# Patient Record
Sex: Female | Born: 1940 | Race: White | Hispanic: No | Marital: Married | Smoking: Never smoker
Health system: Southern US, Community
[De-identification: ages and names within clinical notes are randomized; demographics above are authoritative.]

## PROBLEM LIST (undated history)

## (undated) DIAGNOSIS — C801 Malignant (primary) neoplasm, unspecified: Secondary | ICD-10-CM

## (undated) DIAGNOSIS — M549 Dorsalgia, unspecified: Secondary | ICD-10-CM

## (undated) DIAGNOSIS — K2970 Gastritis, unspecified, without bleeding: Secondary | ICD-10-CM

## (undated) DIAGNOSIS — I10 Essential (primary) hypertension: Secondary | ICD-10-CM

## (undated) DIAGNOSIS — K5792 Diverticulitis of intestine, part unspecified, without perforation or abscess without bleeding: Secondary | ICD-10-CM

## (undated) DIAGNOSIS — D1803 Hemangioma of intra-abdominal structures: Secondary | ICD-10-CM

## (undated) DIAGNOSIS — E785 Hyperlipidemia, unspecified: Secondary | ICD-10-CM

## (undated) DIAGNOSIS — E669 Obesity, unspecified: Secondary | ICD-10-CM

## (undated) DIAGNOSIS — E079 Disorder of thyroid, unspecified: Secondary | ICD-10-CM

## (undated) DIAGNOSIS — E119 Type 2 diabetes mellitus without complications: Secondary | ICD-10-CM

## (undated) DIAGNOSIS — I5031 Acute diastolic (congestive) heart failure: Secondary | ICD-10-CM

## (undated) DIAGNOSIS — IMO0001 Reserved for inherently not codable concepts without codable children: Secondary | ICD-10-CM

## (undated) HISTORY — DX: Acute diastolic (congestive) heart failure: I50.31

## (undated) HISTORY — DX: Diverticulitis of intestine, part unspecified, without perforation or abscess without bleeding: K57.92

## (undated) HISTORY — DX: Obesity, unspecified: E66.9

## (undated) HISTORY — PX: NASAL RECONSTRUCTION: SHX2069

## (undated) HISTORY — DX: Hemangioma of intra-abdominal structures: D18.03

## (undated) HISTORY — PX: APPENDECTOMY: SHX54

## (undated) HISTORY — DX: Gastritis, unspecified, without bleeding: K29.70

## (undated) HISTORY — DX: Type 2 diabetes mellitus without complications: E11.9

## (undated) HISTORY — PX: ABDOMINAL HYSTERECTOMY: SHX81

## (undated) HISTORY — DX: Reserved for inherently not codable concepts without codable children: IMO0001

## (undated) HISTORY — DX: Dorsalgia, unspecified: M54.9

## (undated) HISTORY — DX: Disorder of thyroid, unspecified: E07.9

## (undated) HISTORY — DX: Essential (primary) hypertension: I10

## (undated) HISTORY — DX: Hyperlipidemia, unspecified: E78.5

---

## 1998-10-10 ENCOUNTER — Other Ambulatory Visit: Admission: RE | Admit: 1998-10-10 | Discharge: 1998-10-10 | Payer: Self-pay | Admitting: Family Medicine

## 2000-09-17 ENCOUNTER — Encounter: Admission: RE | Admit: 2000-09-17 | Discharge: 2000-09-17 | Payer: Self-pay | Admitting: Orthopaedic Surgery

## 2000-09-17 ENCOUNTER — Encounter: Payer: Self-pay | Admitting: Orthopaedic Surgery

## 2000-09-23 ENCOUNTER — Other Ambulatory Visit: Admission: RE | Admit: 2000-09-23 | Discharge: 2000-09-23 | Payer: Self-pay | Admitting: Family Medicine

## 2001-10-19 ENCOUNTER — Encounter: Admission: RE | Admit: 2001-10-19 | Discharge: 2001-10-19 | Payer: Self-pay | Admitting: Family Medicine

## 2001-10-19 ENCOUNTER — Encounter: Payer: Self-pay | Admitting: Family Medicine

## 2002-04-07 ENCOUNTER — Encounter: Admission: RE | Admit: 2002-04-07 | Discharge: 2002-04-07 | Payer: Self-pay | Admitting: Family Medicine

## 2002-04-07 ENCOUNTER — Encounter: Payer: Self-pay | Admitting: Family Medicine

## 2002-05-14 ENCOUNTER — Encounter: Payer: Self-pay | Admitting: Orthopaedic Surgery

## 2002-05-14 ENCOUNTER — Encounter: Admission: RE | Admit: 2002-05-14 | Discharge: 2002-05-14 | Payer: Self-pay | Admitting: Orthopaedic Surgery

## 2002-11-02 ENCOUNTER — Encounter: Payer: Self-pay | Admitting: Family Medicine

## 2002-11-02 ENCOUNTER — Encounter: Admission: RE | Admit: 2002-11-02 | Discharge: 2002-11-02 | Payer: Self-pay | Admitting: Family Medicine

## 2002-11-05 ENCOUNTER — Encounter: Admission: RE | Admit: 2002-11-05 | Discharge: 2002-11-05 | Payer: Self-pay | Admitting: Family Medicine

## 2002-11-05 ENCOUNTER — Encounter: Payer: Self-pay | Admitting: Family Medicine

## 2003-08-24 ENCOUNTER — Encounter: Admission: RE | Admit: 2003-08-24 | Discharge: 2003-08-24 | Payer: Self-pay | Admitting: Family Medicine

## 2003-09-06 ENCOUNTER — Ambulatory Visit (HOSPITAL_COMMUNITY): Admission: RE | Admit: 2003-09-06 | Discharge: 2003-09-06 | Payer: Self-pay | Admitting: Family Medicine

## 2004-03-15 ENCOUNTER — Inpatient Hospital Stay (HOSPITAL_COMMUNITY): Admission: EM | Admit: 2004-03-15 | Discharge: 2004-03-21 | Payer: Self-pay | Admitting: Emergency Medicine

## 2004-03-16 ENCOUNTER — Encounter: Payer: Self-pay | Admitting: Cardiology

## 2004-03-20 ENCOUNTER — Encounter (INDEPENDENT_AMBULATORY_CARE_PROVIDER_SITE_OTHER): Payer: Self-pay | Admitting: *Deleted

## 2004-06-13 ENCOUNTER — Ambulatory Visit (HOSPITAL_COMMUNITY): Admission: RE | Admit: 2004-06-13 | Discharge: 2004-06-13 | Payer: Self-pay | Admitting: Family Medicine

## 2004-06-20 ENCOUNTER — Encounter (INDEPENDENT_AMBULATORY_CARE_PROVIDER_SITE_OTHER): Payer: Self-pay | Admitting: *Deleted

## 2004-06-20 ENCOUNTER — Ambulatory Visit (HOSPITAL_COMMUNITY): Admission: RE | Admit: 2004-06-20 | Discharge: 2004-06-21 | Payer: Self-pay | Admitting: General Surgery

## 2006-06-24 ENCOUNTER — Encounter: Admission: RE | Admit: 2006-06-24 | Discharge: 2006-06-24 | Payer: Self-pay | Admitting: Family Medicine

## 2006-08-28 ENCOUNTER — Encounter: Admission: RE | Admit: 2006-08-28 | Discharge: 2006-08-28 | Payer: Self-pay | Admitting: Family Medicine

## 2006-09-04 ENCOUNTER — Encounter: Admission: RE | Admit: 2006-09-04 | Discharge: 2006-09-04 | Payer: Self-pay | Admitting: Family Medicine

## 2006-11-06 ENCOUNTER — Emergency Department (HOSPITAL_COMMUNITY): Admission: EM | Admit: 2006-11-06 | Discharge: 2006-11-07 | Payer: Self-pay | Admitting: Emergency Medicine

## 2007-04-14 ENCOUNTER — Ambulatory Visit: Payer: Self-pay | Admitting: Vascular Surgery

## 2007-07-16 ENCOUNTER — Inpatient Hospital Stay (HOSPITAL_COMMUNITY): Admission: EM | Admit: 2007-07-16 | Discharge: 2007-07-18 | Payer: Self-pay | Admitting: Emergency Medicine

## 2008-01-19 ENCOUNTER — Encounter: Admission: RE | Admit: 2008-01-19 | Discharge: 2008-01-19 | Payer: Self-pay | Admitting: Family Medicine

## 2008-03-18 ENCOUNTER — Encounter: Admission: RE | Admit: 2008-03-18 | Discharge: 2008-03-18 | Payer: Self-pay | Admitting: Family Medicine

## 2008-07-13 ENCOUNTER — Encounter: Admission: RE | Admit: 2008-07-13 | Discharge: 2008-07-13 | Payer: Self-pay | Admitting: Cardiology

## 2008-10-10 ENCOUNTER — Encounter: Admission: RE | Admit: 2008-10-10 | Discharge: 2008-10-10 | Payer: Self-pay | Admitting: Orthopaedic Surgery

## 2009-06-10 DIAGNOSIS — IMO0001 Reserved for inherently not codable concepts without codable children: Secondary | ICD-10-CM

## 2009-06-10 HISTORY — DX: Reserved for inherently not codable concepts without codable children: IMO0001

## 2009-06-23 ENCOUNTER — Ambulatory Visit: Admission: RE | Admit: 2009-06-23 | Discharge: 2009-06-23 | Payer: Self-pay | Admitting: Orthopaedic Surgery

## 2009-06-23 ENCOUNTER — Ambulatory Visit: Payer: Self-pay | Admitting: Surgery

## 2009-06-23 ENCOUNTER — Encounter (INDEPENDENT_AMBULATORY_CARE_PROVIDER_SITE_OTHER): Payer: Self-pay | Admitting: Orthopaedic Surgery

## 2010-02-05 ENCOUNTER — Ambulatory Visit: Payer: Self-pay | Admitting: Cardiology

## 2010-02-08 ENCOUNTER — Ambulatory Visit: Payer: Self-pay | Admitting: Cardiology

## 2010-05-30 ENCOUNTER — Ambulatory Visit: Payer: Self-pay | Admitting: Cardiology

## 2010-06-13 ENCOUNTER — Ambulatory Visit: Payer: Self-pay | Admitting: Cardiology

## 2010-06-19 ENCOUNTER — Ambulatory Visit: Payer: Self-pay | Admitting: Cardiology

## 2010-06-25 ENCOUNTER — Encounter
Admission: RE | Admit: 2010-06-25 | Discharge: 2010-06-25 | Payer: Self-pay | Source: Home / Self Care | Attending: Family Medicine | Admitting: Family Medicine

## 2010-09-13 ENCOUNTER — Other Ambulatory Visit (INDEPENDENT_AMBULATORY_CARE_PROVIDER_SITE_OTHER): Payer: Medicare Other | Admitting: *Deleted

## 2010-09-13 DIAGNOSIS — E7800 Pure hypercholesterolemia, unspecified: Secondary | ICD-10-CM

## 2010-09-13 LAB — LIPID PANEL
Cholesterol: 219 mg/dL — ABNORMAL HIGH (ref 0–200)
HDL: 44 mg/dL (ref >39.00)
Total CHOL/HDL Ratio: 5
Triglycerides: 91 mg/dL (ref 0.0–149.0)
VLDL: 18.2 mg/dL (ref 0.0–40.0)

## 2010-09-13 LAB — HEPATIC FUNCTION PANEL
ALT: 15 U/L (ref 0–35)
AST: 13 U/L (ref 0–37)
Albumin: 3.6 g/dL (ref 3.5–5.2)
Alkaline Phosphatase: 75 U/L (ref 39–117)
Bilirubin, Direct: 0.2 mg/dL (ref 0.0–0.3)
Total Bilirubin: 0.8 mg/dL (ref 0.3–1.2)
Total Protein: 6.2 g/dL (ref 6.0–8.3)

## 2010-09-13 LAB — BASIC METABOLIC PANEL
BUN: 13 mg/dL (ref 6–23)
CO2: 27 mEq/L (ref 19–32)
Calcium: 9 mg/dL (ref 8.4–10.5)
Chloride: 106 mEq/L (ref 96–112)
Creatinine, Ser: 0.6 mg/dL (ref 0.4–1.2)
GFR: 103.03 mL/min (ref >60.00)
Glucose, Bld: 112 mg/dL — ABNORMAL HIGH (ref 70–99)
Potassium: 3.9 mEq/L (ref 3.5–5.1)
Sodium: 144 mEq/L (ref 135–145)

## 2010-09-13 LAB — LDL CHOLESTEROL, DIRECT: Direct LDL: 159.5 mg/dL

## 2010-09-14 ENCOUNTER — Other Ambulatory Visit: Payer: Self-pay | Admitting: *Deleted

## 2010-09-17 ENCOUNTER — Encounter: Payer: Self-pay | Admitting: *Deleted

## 2010-09-18 ENCOUNTER — Encounter: Payer: Self-pay | Admitting: Cardiology

## 2010-09-18 ENCOUNTER — Ambulatory Visit (INDEPENDENT_AMBULATORY_CARE_PROVIDER_SITE_OTHER): Payer: Medicare Other | Admitting: Cardiology

## 2010-09-18 DIAGNOSIS — D1803 Hemangioma of intra-abdominal structures: Secondary | ICD-10-CM

## 2010-09-18 DIAGNOSIS — Z780 Asymptomatic menopausal state: Secondary | ICD-10-CM | POA: Insufficient documentation

## 2010-09-18 DIAGNOSIS — E119 Type 2 diabetes mellitus without complications: Secondary | ICD-10-CM

## 2010-09-18 DIAGNOSIS — I119 Hypertensive heart disease without heart failure: Secondary | ICD-10-CM

## 2010-09-18 DIAGNOSIS — R079 Chest pain, unspecified: Secondary | ICD-10-CM | POA: Insufficient documentation

## 2010-09-18 DIAGNOSIS — E785 Hyperlipidemia, unspecified: Secondary | ICD-10-CM

## 2010-09-18 DIAGNOSIS — R42 Dizziness and giddiness: Secondary | ICD-10-CM | POA: Insufficient documentation

## 2010-09-18 DIAGNOSIS — E039 Hypothyroidism, unspecified: Secondary | ICD-10-CM

## 2010-09-18 DIAGNOSIS — IMO0001 Reserved for inherently not codable concepts without codable children: Secondary | ICD-10-CM | POA: Insufficient documentation

## 2010-09-18 NOTE — Assessment & Plan Note (Signed)
The patient complains of lack of energy.  However she appears to be euthyroid clinically.  She is getting ready to go on a cruise to [place] with her husband and her grandson Soon.

## 2010-09-18 NOTE — Assessment & Plan Note (Signed)
The patient has a history of hemangioma of the liver.  This causes right upper quadrant discomfort at times.  The patient is also been having some menopausal symptoms of vaginitis and has an appointment to see her gynecologist soon.  She is aware that she wants to avoid estrogen therapy because of the hemangioma of the liver.

## 2010-09-18 NOTE — Assessment & Plan Note (Signed)
The patient has a long history of essential hypertension.  She has been having occasional headaches and occasional dizziness but she's had no recent chest pain or angina.

## 2010-09-18 NOTE — Progress Notes (Signed)
History of Present Illness: This 70 year old woman is seen for a scheduled followup office visit she has a past history of essential hypertension and history of diabetes mellitus and dyslipidemia.  She's had a history of atypical chest pain.  She had a normal LexiScan Cardiolite stress test on 12/20/09.  There was no ischemia and her ejection fraction was 77%.  She's had problems with elevated hemoglobin A 1C.  In January her A1c was 9.7.  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  Current Outpatient Prescriptions  Medication Sig Dispense Refill  . aspirin 81 MG tablet Take 81 mg by mouth daily.        . colesevelam (WELCHOL) 625 MG tablet Take 1,875 mg by mouth as needed.        . hydrochlorothiazide 25 MG tablet Take 25 mg by mouth daily.        . insulin glargine (LANTUS) 100 UNIT/ML injection Inject into the skin at bedtime.        Marland Kitchen levothyroxine (SYNTHROID, LEVOTHROID) 150 MCG tablet Take 150 mcg by mouth daily.        . nitroGLYCERIN (NITROSTAT) 0.4 MG SL tablet Place 0.4 mg under the tongue every 5 (five) minutes as needed.        Maxwell Caul Bicarbonate (ZEGERID PO) Take by mouth as needed.        . furosemide (LASIX) 40 MG tablet Take 40 mg by mouth 2 (two) times daily.        . meclizine (ANTIVERT) 50 MG tablet Take 25 mg by mouth 3 (three) times daily as needed.        . Zinc 10 MG LOZG Use as directed in the mouth or throat.          Allergies  Allergen Reactions  . Crestor (Rosuvastatin Calcium)     LEG PAIN  . Lipitor (Atorvastatin Calcium)     CHEST PAIN, SOB  . Metformin And Related   . Niaspan (Niacin (Antihyperlipidemic))     RASH  . Pravachol   . Ranexa     EDEMA  . Tricor   . Zetia (Ezetimibe)     NO SLEEP, SOB    Patient Active Problem List  Diagnoses  . Hypothyroidism  . Benign hypertensive heart disease without heart  failure  . Dyslipidemia  . Diabetes mellitus  . Hemangioma of liver  . Postmenopausal state  . Chest pain  . Dizziness    History  Smoking status  . Never Smoker   Smokeless tobacco  . Not on file    History  Alcohol Use No    No family history on file.  Review of Systems: Constitutional: no fever chills diaphoresis or fatigue or change in weight.  Head and neck: no hearing loss, no epistaxis, no photophobia or visual disturbance. Respiratory: No cough, shortness of breath or wheezing. Cardiovascular: No chest pain peripheral edema, palpitations. Gastrointestinal: No abdominal distention, no abdominal pain, no change in bowel habits hematochezia or melena. Genitourinary: No dysuria, no frequency, no urgency, no nocturia. Musculoskeletal:No arthralgias, no back pain, no gait disturbance or myalgias. Neurological: No dizziness, no headaches, , no seizures, no syncope, no weakness, no tremors. Hematologic: No lymphadenopathy, no easy bruising. Psychiatric: No confusion, no hallucinations, no sleep disturbance.    Physical Exam: Filed Vitals:   09/18/10 1340  BP: 136/82  Weight is 191, down 1  pound.  General appearance Reveals a overweight woman in no acute distress.Pupils equal and reactive.   Extraocular Movements are full.  There is no scleral icterus.  The mouth and pharynx are normal.  The neck is supple.  The carotids reveal no bruits.  The jugular venous pressure is normal.  The thyroid is not enlarged.  There is no lymphadenopathy.The chest is clear to percussion and auscultation. There are no rales or rhonchi. Expansion of the chest is symmetrical.The precordium is quiet.  The first heart sound is normal.  The second heart sound is physiologically split.  There is no murmur gallop rub or click.  There is no abnormal lift or heave.The abdomen is soft and nontender. Bowel sounds are normal. The liver and spleen are not enlarged. There Are no abdominal masses. There are no  bruits.  The breasts reveal no masses.The pedal pulses are good.  There is no phlebitis or edema.  There is no cyanosis or clubbing.  Neurologic exam reveals decreased sensation in her feet.   Assessment / Plan: We reviewed her labs.  Work harder on diabetes and dyslipidemia with careful diet and weight loss.  We gave her a prescription for Transderm scope patches to use on her [place] cruise next month.  Recheck in 3 months for followup office visit and lab work including glycohemoglobin

## 2010-09-18 NOTE — Assessment & Plan Note (Signed)
Her lipids are still above goal and she is going to try harder with careful diet to bring these down further.  She will continue same medication.

## 2010-09-18 NOTE — Assessment & Plan Note (Signed)
The patient continues to have some problems with elevated blood sugars.  She also has symptoms of diabetic peripheral neuropathy with decreased feeling in her toes and her feet.  She has balance problems and we have given her a prescription for a rolling walker with a seat since she has a tendency to fall.  She has also had previous spine surgery which may be affecting her leg strength and her balance as well.

## 2010-10-23 NOTE — Discharge Summary (Signed)
Whitney Newton, Whitney Newton                ACCOUNT NO.:  [id number]   MEDICAL RECORD NO.:  [id number]          PATIENT TYPE:  INP   LOCATION:  5128                         FACILITY:  [facility]   PHYSICIAN:  Corinna L. Lendell Caprice, MDDATE OF BIRTH:  31-May-1941   DATE OF ADMISSION:  07/16/2007  DATE OF DISCHARGE:  07/18/2007                               DISCHARGE SUMMARY   DISCHARGE DIAGNOSES:  1. Vomiting, possible mild ileus.  2. Diabetes.  3. Recent back surgery.  4. Hyperlipidemia.  5. History of stroke.   DISCHARGE MEDICATIONS:  Reglan 10 mg p.o. q.6 h p.r.n. nausea.  She may  continue the rest of her outpatient medications, which include Ziac,  hydrochlorothiazide, Synthroid, aspirin, Lantus, Colace, Cozaar.   HISTORY AND PHYSICAL:  Please see H&P for complete details.   CONDITION ON DISCHARGE:  Stable.   ACTIVITY:  Ad lib.   FOLLOWUP:  Followup with Dr. Clovis Riley as needed.   PROCEDURES:  None.   DIET:  Diet should be diabetic low-salt.   LABORATORY DATA:  CBC significant for white blood cell count of 10.9, D-  dimer 0.97.  Complete metabolic panel significant for a glucose of 157,  lipase 30.  Urinalysis specific gravity 1.034, moderate bilirubin,  greater than 80 ketones, negative nitrites, small leukocyte esterase.  Hemoccult of the stool was negative.   SPECIAL STUDIES/RADIOLOGY:  Two views of the abdomen on admission showed  probable mild focal ileus, no obstruction.  T-spine x-ray unremarkable.  Chest x-ray 2 views negative.  Flat and upright abdominal film on  February 7 day of discharge was negative.   HISTORY AND HOSPITAL COURSE:  Whitney Newton is a 70 year old white female  patient of Dr. Clovis Riley who had recent back surgery at [facility].  She had  intractable vomiting.  She is a somewhat difficult historian, but was  found to have a possible ileus on admission and was therefore admitted  for supportive care.  She was started on clear liquids,  IV Reglan, IV fluids and IV  Dilaudid.  Her vomiting resolved and at the  time of discharge she had a normal x-ray and was tolerating a regular  diet.  Her abdomen was soft, nontender. She has a very anxious affect,  but otherwise unremarkable physical exam.      Corinna L. Lendell Caprice, MD  Electronically Signed     CLS/MEDQ  D:  07/18/2007  T:  07/20/2007  Job:  [id number]   cc:   L. Lupe Carney, M.D.

## 2010-10-23 NOTE — H&P (Signed)
Whitney Newton                ACCOUNT NO.:  [id number]   MEDICAL RECORD NO.:  [id number]           PATIENT TYPE:   LOCATION:                                 FACILITY:   PHYSICIAN:  Hollice Espy, M.D.DATE OF BIRTH:  Oct 03, 1940   DATE OF ADMISSION:  07/16/2007  DATE OF DISCHARGE:                              HISTORY & PHYSICAL   PRIMARY CARE PHYSICIAN:  Whitney Newton, M.D.   CARDIOLOGIST:  Whitney Newton, M.D.   CHIEF COMPLAINT:  Abdominal pain.   HISTORY OF PRESENT ILLNESS:  The patient is a 71 year old white female  with past medical history of hypertension and diabetes mellitus who, in  the past week, underwent a lower thoracic spine herniated disk repair.  Postoperatively, she was put on narcotics, and she said she tried to use  these sparingly because of concern about constipation.  She continued to  have issues to the point where she had continued nausea and vomiting and  abdominal pain, unable to keep anything down.  She ended up being put on  rectal suppositories which did little in terms of giving her a bowel  movement and has not had one for several days.  When she came to the  emergency room today, she was noted to have a white count of 10.9 with  no shift.  The rest of her labs were notable for urine consistent with  being dehydrated.  X-ray of the abdomen noted a focal loop of bowel that  looks consistent with an ileus.  The patient was placed on IV fluids and  already says she is feeling a little bit better.  She denies any  headaches, vision changes, dysphagia.  No chest pain, palpitations.  No  acute shortness of breath.  No wheezing or coughing.  She complains of  some abdominal soreness but currently says that her generalized  abdominal pain is minimal.  She has some nausea, but this is minimal.  No hematuria or dysuria.  No diarrhea.  No focal extremity numbness,  weakness, or pain.   REVIEW OF SYSTEMS:  Otherwise negative.   PAST MEDICAL  HISTORY:  1. Hypothyroidism.  2. Hypertension.  3. Hyperlipidemia.  4. Diabetes mellitus.  5. Recent thoracic spinal surgery.   MEDICATIONS:  She is on aspirin, Cozaar, hydrochlorothiazide, Lantus,  Synthroid, Ziac, Darvocet-N 100, p.r.n. Percocet, and Levsin.   ALLERGIES:  No known drug allergies.   SOCIAL HISTORY:  No tobacco, alcohol, or drug use.   FAMILY HISTORY:  Noncontributory.   PHYSICAL EXAMINATION:  VITAL SIGNS:  On admission, temperature 99.0,  heart rate 111, now down to 78.  Blood pressure 160/91, now down to  159/68, respirations 20, O2 saturation 97% on room air.  GENERAL:  Alert and oriented x3.  No apparent distress.  HEENT:  Normocephalic and atraumatic.  Mucous membranes dry.  NECK:  No carotid bruits.  HEART:  Regular rate and rhythm.  S1, S2.  Soft 2/6 systolic ejection  murmur.  LUNGS:  Clear to auscultation bilaterally.  ABDOMEN:  Soft, nontender, mild distention.  Scant bowel sounds.  EXTREMITIES:  No clubbing, cyanosis, or edema.   LABORATORY DATA:  Thoracic spine and chest x-ray are unremarkable.   Abdominal x-rays as per HPI.   White count 7.9, hemoglobin 15, hematocrit 45, MCV 89, platelet count  342, no shift.  Sodium 140, potassium 3.5, chloride 103, bicarb 26, BUN 13, creatinine  0.8, glucose 157.  LFTs unremarkable.  Lipase 30. UA: Moderate  bilirubin, greater than 80 ketones, total protein 30.  D-dimer was  ordered and is pending.  Urine microscopy shows 0-6 white cells and rare  bacteria.   ASSESSMENT AND PLAN:  1. Postoperative ileus plus with history of diabetes.  Will make      n.p.o., IV hydration, IV Reglan. Will advance diet as tolerated.  2. Diabetes mellitus.  N.P.O. for now until she is able to tolerate a      full solid diet, put her on sliding scale only.  3. Hypertension.  P.R.N. Lopressor.  4. Hypothyroidism, holding Synthroid.      Hollice Espy, M.D.  Electronically Signed     SKK/MEDQ  D:  07/16/2007  T:   07/16/2007  Job:  [id number]   cc:   Whitney Newton, M.D.  Whitney Newton, M.D.

## 2010-10-23 NOTE — Procedures (Signed)
DUPLEX DEEP VENOUS EXAM - LOWER EXTREMITY   INDICATION:  Bilateral lower extremity leg swelling (off and on for  years) which decreases with exercise.  Patient woke up morning of  04/14/07 with excruciating right calf pain which lingered throughout the  morning.  The patient states that these are the same symptoms as when  she had a calf DVT three years ago.   HISTORY:  Edema:  Yes, bilaterally.  Trauma/Surgery:  Right knee surgery three years ago.  Pain:  Right calf.  PE:  No.  Previous DVT:  Three years ago in calf.  Anticoagulants:  Aspirin 81 mg per day.  Other:  Stroke in October, 2005.   DUPLEX EXAM:                CFV   SFV   PopV  PTV    GSV                R  L  R  L  R  L  R   L  R  L  Thrombosis    0  0  0  0  0  0  0   0  0  0  Spontaneous   +  +  +  +  +  +  Phasic        +  +  +  +  +  +  Augmentation  +  +  +  +  +  +  +   +  +  +  Compressible  +  +  +  +  +  +  +   +  +  +  Competent   Legend:  + - yes  o - no  p - partial  D - decreased   IMPRESSION:  No evidence of bilateral deep or superficial venous  thrombosis.    _____________________________  Larina Earthly, M.D.   PB/MEDQ  D:  04/15/2007  T:  04/15/2007  Job:  [id number]

## 2010-10-26 NOTE — Op Note (Signed)
NAMEKUUIPO, ANZALDO                ACCOUNT NO.:  [id number]   MEDICAL RECORD NO.:  [id number]          PATIENT TYPE:  OIB   LOCATION:  2899                         FACILITY:  [facility]   PHYSICIAN:  Leonie Man, M.D.   DATE OF BIRTH:  March 12, 1941   DATE OF PROCEDURE:  06/20/2004  DATE OF DISCHARGE:                                 OPERATIVE REPORT   PREOPERATIVE DIAGNOSIS:  Chronic calculous cholecystitis.   POSTOPERATIVE DIAGNOSIS:  Chronic calculous cholecystitis.   PROCEDURE:  Laparoscopic cholecystectomy with intraoperative cholangiogram.   SURGEON:  Leonie Man, M.D.   ASSISTANT:  Ollen Gross. Carolynne Edouard, M.D.   ANESTHESIA:  General.   NOTE:  The patient is a 70 year old woman who was presenting with epigastric  bloating, pain, nausea and vomiting.  On ultrasound noted to have  cholelithiasis.  Liver function studies are all within normal limits. No  hyperamylasemia associated. The patient has not been having any chills or  fevers. She comes to the operating room now after the risks and potential  benefits of surgery have been discussed. All questions answered, consent  obtained.   PROCEDURE:  Following the induction of satisfactory general anesthesia with  the patient positioned supinely, the abdomen was routinely prepped and  draped to be included in a sterile operative field.  Laparoscopy created at  the umbilicus with insertion of a Hassan cannula, insufflation of the  peritoneal cavity to 42 mmHg using carbon dioxide. Camera was inserted.  Visual exploration of the abdomen carried out.  There are a few adhesions  due to previous surgery.  The liver edges were sharp, and the liver surface  smooth.  There were multiple adhesions to the wall of the gallbladder from  the omentum. Under direct vision, epigastric and lateral ports were placed.  The gallbladder is grasped and retracted cephalad, and adhesions to the  gallbladder wall are taken down serially, carrying the dissection  down to  the ampulla of the gallbladder. The ampulla is held and isolated.  The  cystic duct and cystic artery are identified, tracing the cystic duct up to  the cystic duct-gallbladder junction and the cystic artery up to its entry  into the gallbladder wall. The cystic duct is clipped proximally and opened.  The cystic artery was triply clipped and transected. Cystic duct  cholangiogram was carried out with a Cook catheter, which was passed into  the abdominal cavity and inserted into the cystic duct, and one-half  strength Hypaque was injected into the extrahepatic biliary system with  prompt flow of contrast into the duodenum. The caliber of the extrahepatic  biliary tree was normal. There were no filling defects noted. The cystic  duct catheter was removed, and the cystic duct was triply clipped and  transected. The gallbladder was dissected free from the liver bed using  electrocautery and maintaining hemostasis throughout the course of  dissection.  During the course of the dissection, a small hole was made into  the gallbladder with some spillage of bile. The gallbladder, having been  removed, was then placed into an Endopouch.  The right upper quadrant was  thoroughly irrigated with multiple aliquots of normal saline until clear.  The gallbladder was then retrieved through the umbilical port with a camera  in the epigastric port. This was accomplished without difficulty. Sponge,  instrument and sharp counts were verified. Pneumoperitoneum allowed to  deflate.  The lateral trocars removed under direct vision. Sponge,  instrument and sharp counts were then verified, and the wounds closed in  layers as follows:  Umbilical wound in two layers with 0 Vicryl and 4-0  Monocryl, and 4-  0 Monocryl was used to close the epigastric and lateral flank wounds. All  wounds reinforced with Steri-Strips. Sterile dressings applied. Anesthetic  reversed. The patient removed from the operating room  to the recovery room  in stable condition.  She tolerated the procedure well.      Patr   PB/MEDQ  D:  06/20/2004  T:  06/20/2004  Job:  [id number]   cc:   L. Lupe Carney, M.D.  [address] [place]  [place] [address]  Fax: [phone]

## 2010-10-26 NOTE — H&P (Signed)
Whitney Newton, DELGRANDE                ACCOUNT NO.:  [id number]   MEDICAL RECORD NO.:  [id number]          PATIENT TYPE:  INP   LOCATION:  1851                         FACILITY:  [facility]   PHYSICIAN:  Corinna L. Lendell Caprice, MDDATE OF BIRTH:  [birth year]   DATE OF ADMISSION:  03/15/2004  DATE OF DISCHARGE:                                HISTORY & PHYSICAL   CHIEF COMPLAINT:  Facial weakness and left-sided weakness with left leg  weakness.   HISTORY OF PRESENT ILLNESS:  Whitney Newton is a 70 year old white female  patient of Dr. Quita Skye who presents to the emergency room with left-sided  tingling and weakness.  She was at [facility] when her face began feeling numb,  then her left arm was numb and felt uncoordinated, also her left leg started  getting numb and she was having difficulty walking.  She fumbled in her  purse and noticed that her left hand was having difficulty while she was  trying to open a bottle of aspirin.  She took baby aspirin x2.  Currently  her symptoms are essentially resolved. She has no history of stroke.  She  has had a history of migraines and is very concerned that she had a right-  sided migraine that woke her up in the middle of the night (2 days ago) and  she had some visual changes.  This was typical for her usual migraines.   PAST MEDICAL HISTORY:  1.  Diabetes.  2.  Hypertension.  3.  Hyperlipidemia.  4.  Chronic back pain.   MEDICATIONS:  1.  Welchol 3 tablets p.o. b.i.d.  2.  She is not on any aspirin.  3.  Cozaar 50 mg daily.  4.  Synthroid 0.175 mg alternating with 0.2 mg daily.  5.  Janna Arch (does unknown) b.i.d.  6.  Hydrochlorothiazide 25 mg p.o. daily.   SOCIAL HISTORY:  The patient does not smoke or drink.  She is married and  here with her husband, daughter and grandson.  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   FAMILY HISTORY:  Noncontributory.   REVIEW OF SYSTEMS:  As above, otherwise  negative.   PHYSICAL EXAMINATION:  VITAL SIGNS:  Blood pressure is 146/50, heart rate is  70, temperature is normal, respiratory rate is 20.  GENERAL:  The patient is an anxious appearing, overweight, white female.  HEENT:  Normocephalic, atraumatic.  Pupils are equal, round and reactive to  light. She has facial symmetry.  Moist mucous membranes.  Oropharynx is  clear.  NECK:  Supple, no carotid bruits, no thyromegaly.  LUNGS:  Clear to auscultation bilaterally without wheezes, rhonchi or rales.  CARDIOVASCULAR:  Regular rate and rhythm without murmurs, rubs, or gallops.  ABDOMEN:  Normal bowel sounds, soft, nontender, nondistended.  GU/RECTAL:  Deferred.  EXTREMITIES:  No clubbing, cyanosis or edema.  SKIN:  No rash.  PSYCHIATRIC:  Anxious affect, cooperative.  NEUROLOGIC:  The patient is alert and oriented.  Cranial nerves are intact  except for slightly diminished sensation on the lower  half of the lift side  of her face.  Motor strength is 5 out of 5.  Deep tendon reflexes are 1+ and  equal. Babinski is negative.  Romberg negative, no pronator drift, gait is normal. Finger-to-nose normal.   LABORATORY DATA:  CBC is unremarkable.  Basic metabolic panel significant  for a glucose of 186.   EKG shows normal sinus rhythm with a right bundle branch block.  CT of the  brain shows nothing acute.   ASSESSMENT AND PLAN:  1.  Transient ischemic attack. The patient will be admitted to telemetry.  I      will check and MRI, MRA of the brain, carotid Dopplers, echocardiogram,      lipids.  She will continue an aspirin a day.  She will get neurologic      checks.  2.  Diabetes.  Continued outpatient regimen.  3.  Hyperlipidemia.  Continue outpatient regimen.  4.  Hypertension.  5.  Right bundle branch block.      Cori   CLS/MEDQ  D:  03/15/2004  T:  03/16/2004  Job:  [id number]   cc:   L. Lupe Carney, M.D.  [address] [place]  [place] [address]  Fax: [phone]

## 2010-10-26 NOTE — Procedures (Signed)
[age]   CLINICAL INFORMATION:  This patient is alert and oriented and complained of  having episodes of numbness on the side of her mouth that deviated to the  left with visual disturbances and headache.   TECHNICAL DESCRIPTION:  This EEG was recorded during the awake state.  The  background activity shows 10 Hz rhythms with higher amplitudes seen in the  posterior head regions bilaterally.  No evidence of any focal asymmetry is  present during this study.  There was no evidence of any epileptiform  activity or stage 2 sleep present, though the patient did become drowsy.  Hyperventilation testing produced no abnormalities, and photic stimulation  did not produce a driving response in the occipital head regions.   IMPRESSION:  This is normal EEG during the awake and drowsy states.      ZOX:WRUE  D:  03/19/2004 16:29:29  T:  03/20/2004 11:50:11  Job #:  [id number]

## 2010-10-26 NOTE — Consult Note (Signed)
Whitney Newton, Whitney Newton                ACCOUNT NO.:  [id number]   MEDICAL RECORD NO.:  [id number]          PATIENT TYPE:  INP   LOCATION:  2006                         FACILITY:  [facility]   PHYSICIAN:  Gustavus Messing. Orlin Hilding, M.D.DATE OF BIRTH:  [birth year]   DATE OF CONSULTATION:  03/16/2004  DATE OF DISCHARGE:                                   CONSULTATION   CONSULTING PHYSICIAN:  Santina Evans A. Orlin Hilding, M.D.   REFERRING PHYSICIAN:  Melissa L. Ladona Ridgel, M.D.   REASON FOR CONSULTATION:  Transient left sided weakness and numbness.   HISTORY OF PRESENT ILLNESS:  The patient is a 70 year old right handed white  woman with a history of hypertension, diabetes and hyperlipidemia who also  has a childhood history of classic migraine and an adult history of migraine  equivalents with visual scotoma without headache.  Earlier this week, she  had an episode of significant right sided headache which was different from  her previous usual migraine and during that time of a two day period, which  was about four days ago, she also had a five to ten minute episode of total  vision loss in the right eye.  That cleared and she enjoyed a few days of  baseline health on Wednesday.  Yesterday, on Thursday, she was in a store  and had sudden onset of numbness in the left side of her body starting with  the foot and rapidly proceeding up the body associated also with some  weakness. She was noted by her family to have a left facial droop and to  have some stuttering and some psychomotor delay without actual aphasia or  slurred speech noted. At this point, she did not have any clear visual  symptoms. She did have a very mild headache which was not significant and  unlike other headaches she has had.  This cleared by the time she arrived in  the emergency room and lasted about 15 minutes.  She did take some aspirin  immediately when this happened.  She has further been noted to have  complaints of shortness of breath  and was found to have a deep venous  thrombosis in the left leg and there is concern for possible pulmonary  embolus.  She has had some intermittent problems with numbness or weakness  in her legs, more of a pressure palsy type situation and she does have back  problems and is postulated to either have peripheral focal neuropathies,  compression neuropathies versus radiculopathies.   REVIEW OF SYMPTOMS:  HEENT:  Positive for minimal headache.  MUSCULOSKELETAL:  Chronic problems with pain in her back and legs.  PULMONARY:  Chronic shortness of breath and air hunger.  PSYCHIATRIC:  She  also had one episode of an unusual emotional outburst a few weeks ago.   PAST MEDICAL HISTORY:  1.  Hypertension.  2.  Dyslipidemia.  3.  Diabetes mellitus.  4.  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  5.  Hypothyroidism.   MEDICATIONS:  1.  Welchol.  2.  Cozaar.  3.  Synthroid.  4.  Potassium.  5.  Hydrochlorothiazide.  6.  Aspirin although not on a routine basis as an outpatient.  7.  She has been getting some Lovenox while here.   ALLERGIES:  No known drug allergies.   SOCIAL HISTORY:  She is married and lives with her husband. She is retired  Engineer, civil (consulting).  She denies tobacco, alcohol or IV drug use.   FAMILY HISTORY:  Positive for strokes and TIA's in her mother.   PHYSICAL EXAMINATION:  VITAL SIGNS:  Temperature is 97.6, pulse 66,  respirations 20, blood pressure is 128/79, 98% saturation on room air.  GENERAL EXAM:  She is a slightly overweight, otherwise well developed, well  nourished, pleasant white woman who appears stated age and is in no acute  distress.  HEENT: The head is normocephalic, atraumatic.  The neck is supple without  bruits.  HEART:  Regular rate and rhythm without murmurs.  EXTREMITIES:  Without edema or cyanosis.  NEUROLOGIC EXAM:  Mental status:  She is awake and alert with normal  cognition. She has normal  naming and repetition and follows commands.  Cranial nerves:  Pupils are equal and reactive.  Visual fields are full to  confrontation.  Extraocular movements are intact.  Facial sensation is  normal.  Facial motor activity is normal. Hearing is intact.  Palate is  symmetric and tongue is midline.  There is no dysarthria.  On motor exam  there is no drift, no satelliting, normal rapid fine movement.  No  fasciculations, atrophy or tremor.  There is 5/5 strength in all four  extremities when giving full effort although it takes some coaxing in her  lower extremities.  Her gait is unremarkable, a little waddling and awkward  at first and then smoothed out.  Her husband says that is typical of her  when she has been sitting for a while.  Deep tendon reflexes are absent  diffusely. She has downgoing toes to plantar stimulation.  Coordination:  Finger to nose, heel to shin, rapid alternating movement and tandem gait are  normal.  Sensory exam is now normal.   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  Carotids were unremarkable.   LABORATORY DATA:  Unremarkable.   MRA of the posterior and anterior circulations are normal.   ASSESSMENT:  1.  Transient left face, arm and leg weakness and numbness consistent with a      right brain transient ischemic attack.  2.  Earlier right visual loss consistent with amaurosis fugax, right side.  3.  Migraines and migraine equivalents although her current episodes are not      consistent with these.  4.  Deep venous thrombosis with a question of a pulmonary embolus.  This is     potentially related to her transient ischemic attack if the patient has       a septal defect with a right to left shunt.  She does have independent      stroke risk factors.  She does have evidence of a couple of more lesions      in the right brain than the left.  However, there is nothing shown  clearly in the carotid.  Still, a possible scenario is a deep venous      thrombosis embolizing to the lung and crossing from the right heart to      the left heart via a right to left shunt from a septal defect and then      embolizing to the carotid going to the ophthalmic artery and the middle      cerebral artery branches.  Alternatively, this could simply be small      vessel disease. I do think it is a transient ischemic attack, however,      rather than migraine.   RECOMMENDATIONS:  1.  Would check echo results.  She may need a transesophageal      echocardiogram.  2.  I will order a transcranial Doppler with bubble study and embolic      evaluation which may also be helpful.  3.  She is currently on Lovenox and may need to go on Coumadin anyway for      deep venous thrombosis treatment and possibly to prevent pulmonary      embolus.  4.  She may need to go on aspirin or Aggrenox for purposes of small vessel      disease, but that remains to be seen.  5.  The stroke team will follow.       CAW/MEDQ  D:  03/16/2004  T:  03/17/2004  Job:  [id number]   cc:   Melissa L. Ladona Ridgel, MD  Fax: [phone]

## 2010-10-26 NOTE — Discharge Summary (Signed)
NAMEAMELIAH, Whitney Newton                ACCOUNT NO.:  [id number]   MEDICAL RECORD NO.:  [id number]          PATIENT TYPE:  INP   LOCATION:  2006                         FACILITY:  [facility]   PHYSICIAN:  Hollice Espy, M.D.DATE OF BIRTH:  12/19/1940   DATE OF ADMISSION:  03/15/2004  DATE OF DISCHARGE:  03/21/2004                                 DISCHARGE SUMMARY   ADDENDUM:  When patient came in she had been complaining lately of a vaginal  discharge, described as grayish. She also complained of a burning which she  described as worse when she bent over. There was some question and concern  whether this was a GYN problem versus a neurologic problem given that she  was complaining of burning and pain when she bent over. However, my partner,  Dr. Ladona Ridgel, was able to discuss this with the patient and a UA as well as  wet prep were performed. The wet prep was negative for any type of  Trichomonas, clue cells or so. Her UA was noted to be completely negative,  and a urine culture showed only 20,000 colonies of multiple pathogens, most  likely a contaminate. The patient was still complaining of an occasional  burning, but this had resolved by the time of discharge. She, however, has  noticed an increase in urinary frequency. Given that she has a normal white  count, no fever and the burning seems to have stopped, I am comfortable with  not treating her at this time. I also requested a GYN consult, however, they  told me that the first time they would be able to see the patient would be  on the evening of March 21, 2004. The patient said that she does not want  to wait and she will make an outpatient appointment with GYN. In addition, I  have given her a prescription for Cipro 500 mg p.o. b.i.d. x3 days which the  patient had the option of filling if she is not able to get an OB/GYN  consult for several weeks and is complaining of continued dysuria. The  patient says she will not plan on filling  this prescription unless she  continues to have severe symptoms and cannot get a GYN appointment right  away.      Send   SKK/MEDQ  D:  03/21/2004  T:  03/21/2004  Job:  [id number]   cc:   L. Lupe Carney, M.D.  [address] [place]  [place] [address]  Fax: [phone]   Cassell Clement, M.D.  [address]  [place]  [place] [address]  Fax: [phone]

## 2010-10-26 NOTE — Discharge Summary (Signed)
Whitney Newton, Whitney Newton                ACCOUNT NO.:  [id number]   MEDICAL RECORD NO.:  [id number]          PATIENT TYPE:  INP   LOCATION:  2006                         FACILITY:  [facility]   PHYSICIAN:  Hollice Espy, M.D.DATE OF BIRTH:  07-Apr-1941   DATE OF ADMISSION:  03/15/2004  DATE OF DISCHARGE:  03/21/2004                                 DISCHARGE SUMMARY   CONSULTATIONS:  1.  Pramod P. Pearlean Brownie, MD, Neurology.  2.  Cassell Clement, M.D., Cardiology.   PRIMARY CARE PHYSICIAN:  L. Lupe Carney, M.D.   DISCHARGE DIAGNOSES:  1.  Transient ischemic attack.  2.  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  3.  History of migraine.  4.  Diabetes.  5.  Hypertension.  6.  Hyperlipidemia.  7.  Nonocclusive deep vein thrombosis of the left peroneal vessel.  No      evidence of deep vein thrombosis on the right side.   DISCHARGE MEDICATIONS:  1.  Lovenox 90 mg subcu q.12h.  To be done upon discharge until the      patient's INR is within the therapeutic range.  2.  Coumadin 7.5 mg p.o. every evening.  The patient will continue this      medication until INR is therapeutic.  At that time, her PCP may      determine the final dosage for patient to stay on Coumadin.  3.  WelChol three tablets p.o. b.i.d.  4.  Cozaar 50 mg daily.  5.  Synthroid one 775 mg alternating with 200 mcg daily.  6.  Hydrochlorothiazide 25 mg p.o. daily.  7.  K-Ciel 20 mEq p.o. daily.   HISTORY OF PRESENT ILLNESS:  The patient is a 70 year old white female  patient of Whitney Newton's who presented to the emergency room on  March 15, 2004, complaining of left sided tingling and weakness.  That day,  she had complained of the right side of her face being numb, and all of a  sudden her left arm felt numb and uncoordinated.  She was noticing some  difficulty opening her purse.  She took a baby aspirin x2 and her symptoms  eventually resolved.  She had no previous history of stroke.  She did have a  history of  migraines and was concerned this may be stroke or a migraine  symptoms.  She has told Whitney Newton in the past that she has had these symptoms  typical for her usual migraines, but she has not had this spell in awhile.   The patient was admitted.  CT of the brain showed no evidence of any acute  infarction.  EKG was normal sinus rhythm with right bundle branch block.  The only thing noteworthy on her physical examination was that she had  cranial nerves intact except for slightly diminished sensation of the lower  half of the left side of her face.  The patient was admitted for further  workup.   HOSPITAL COURSE:  #1 - TRANSIENT ISCHEMIC ATTACK.  Neurology was consulted  to see the patient.  Aspirin was recommended to be started.  The patient has  a history of not being able to tolerate aspirin in the past secondary to  stomach upset.  In addition, cranial Dopplers, carotid Dopplers and MRI were  ordered as well.  Carotid Dopplers showed no evidence of any significant  plaque or ICA stenosis.  It was felt by Neurology that patient's migraine  and migraine equivalence and episode of right vision loss were less  consistent with a migraine but more consistent with a TIA and symptoms.  MRI  was ordered and was done which showed an old right deep right matter  infarct, but no evidence of any acute infarct.  Given the patient's history  of old infarct, current symptoms, old recent episode of vision loss a few  days prior to admission, an aggressive workup was recommended.  The patient  was managed for her diabetes and hyperlipidemia.  Transcranial Dopplers were  ordered and EEG and 2D echocardiogram were ordered as well.  The patient had  transcranial Doppler bubble study done on March 19, 2004, performed by Dr.  Pearlean Brownie.  It showed no evidence of any intracardiac communication between the  right and left side.  In addition, a 2D echo was done on the patient back on  March 16, 2004.  This was performed  by patient's cardiologist Dr.  Patty Newton.  It noted that her overall left ventricular systolic function was  mildly decreased, but there were no regional wall motion abnormalities, and  there was some mild mitral regurgitation.  No evidence of any cardiovascular  source.  The patient had a continued workup.  She had a TEE done on March 20, 2004.  This too showed only mild atherosclerosis, but no evidence of any  cardioembolic source either.  The patient had an EEG done on March 19, 2004, which showed normal both in the awake and sleeping state.  No evidence  of any seizure activity.  Given all these findings, there was no need for  any type of intervention done to prevent any further CVA's, and medical  management was recommended.  As in the paragraph below, regarding the  patient's DVT, she will continue on Coumadin for six months.  Once the  Coumadin has been discontinued following resolution of her DVT, she can be  changed over to Aggrenox which will be continued indefinitely.   #2 - DEEP VEIN THROMBOSIS.  The patient was complaining of some right calf  pain as well, on admission.  She was noted on physical examination to not  really have any evidence of any Homan's sign or significant calf size, one  greater than the other.  Dopplers were performed, of course, to rule out  DVT, and a nonocclusive DVT at the peroneal vessel was noted.  There was no  superficial venous thrombosis or Baker's cyst noted.  Chest CT was negative  for pulmonary embolus.  At this time, the patient was started on Lovenox, as  well as, a Coumadin protocol.  She received Coumadin for several days, and  by day of discharge, her Coumadin level was up to 1.2.  Case manager  discussed with patient's insurance, and with preauthorization, the patient's  copay for Lovenox would be $20.  The patient prefers to go home receiving  continual Lovenox shots which she will self administer.  She received teaching for this  prior to discharge.  In addition, a Home Health nurse will  come in to assist with Lovenox shots as well as checking her INR until her  INR level is  found to be therapeutic between 2-3.   #3 - DIABETES.  Stable medical issue.   #4 - HYPERTENSION/HYPERLIPIDEMIA.  Stable medical issues.   #5 - MIGRAINES.  After it was felt that her TIA symptoms were not related to  her migraines, the patient did have one episode of a headache with some  visual flashing light on March 18, 2004.  This was felt to be secondary to  her migraine.  These symptoms resolved by the morning of March 20, 2004.   CONDITION ON DISCHARGE:  The patient is felt to be medically stable for  discharge.   DISPOSITION:  Improved.   DISCHARGE INSTRUCTIONS:  1.  Diet:  Heart healthy, 1800, ADA diet.  2.  Activity:  As tolerated.  3.  She is being discharged to home.      Send   SKK/MEDQ  D:  03/21/2004  T:  03/21/2004  Job:  [id number]   cc:   Cassell Clement, M.D.  [address]  [place]  [place] [address]  Fax: [phone]   L. Lupe Carney, M.D.  [address] [place]  [place] [address]  Fax: [phone]   Pramod P. Pearlean Brownie, MD  Fax: [phone]

## 2011-01-23 ENCOUNTER — Ambulatory Visit (INDEPENDENT_AMBULATORY_CARE_PROVIDER_SITE_OTHER): Payer: Medicare Other | Admitting: *Deleted

## 2011-01-23 DIAGNOSIS — E039 Hypothyroidism, unspecified: Secondary | ICD-10-CM

## 2011-01-23 DIAGNOSIS — I119 Hypertensive heart disease without heart failure: Secondary | ICD-10-CM

## 2011-01-23 DIAGNOSIS — E119 Type 2 diabetes mellitus without complications: Secondary | ICD-10-CM

## 2011-01-23 DIAGNOSIS — E785 Hyperlipidemia, unspecified: Secondary | ICD-10-CM

## 2011-01-23 DIAGNOSIS — D1803 Hemangioma of intra-abdominal structures: Secondary | ICD-10-CM

## 2011-01-23 LAB — LIPID PANEL
HDL: 51.4 mg/dL (ref >39.00)
Triglycerides: 87 mg/dL (ref 0.0–149.0)
VLDL: 17.4 mg/dL (ref 0.0–40.0)

## 2011-01-23 LAB — HEMOGLOBIN A1C: Hgb A1c MFr Bld: 8.8 % — ABNORMAL HIGH (ref 4.6–6.5)

## 2011-01-23 LAB — BASIC METABOLIC PANEL
BUN: 20 mg/dL (ref 6–23)
Chloride: 109 mEq/L (ref 96–112)
GFR: 89.27 mL/min (ref >60.00)
Potassium: 3.5 mEq/L (ref 3.5–5.1)
Sodium: 143 mEq/L (ref 135–145)

## 2011-01-23 LAB — HEPATIC FUNCTION PANEL
Bilirubin, Direct: 0.1 mg/dL (ref 0.0–0.3)
Total Bilirubin: 0.6 mg/dL (ref 0.3–1.2)
Total Protein: 6.4 g/dL (ref 6.0–8.3)

## 2011-01-24 ENCOUNTER — Other Ambulatory Visit: Payer: Self-pay | Admitting: *Deleted

## 2011-01-24 ENCOUNTER — Ambulatory Visit (HOSPITAL_COMMUNITY)
Admission: RE | Admit: 2011-01-24 | Discharge: 2011-01-24 | Disposition: A | Discharge status: Home / Self Care | Payer: Medicare Other | Source: Ambulatory Visit | Attending: Orthopaedic Surgery | Admitting: Orthopaedic Surgery

## 2011-01-24 DIAGNOSIS — M79609 Pain in unspecified limb: Secondary | ICD-10-CM | POA: Insufficient documentation

## 2011-01-24 DIAGNOSIS — M7989 Other specified soft tissue disorders: Secondary | ICD-10-CM | POA: Insufficient documentation

## 2011-01-24 MED ORDER — NITROGLYCERIN 0.4 MG SL SUBL: 0.4000 mg | SUBLINGUAL_TABLET | SUBLINGUAL | Status: DC | PRN

## 2011-01-24 NOTE — Telephone Encounter (Signed)
Refilled meds per fax request.  

## 2011-01-25 ENCOUNTER — Encounter: Payer: Self-pay | Admitting: Cardiology

## 2011-01-25 ENCOUNTER — Ambulatory Visit (INDEPENDENT_AMBULATORY_CARE_PROVIDER_SITE_OTHER): Payer: Medicare Other | Admitting: Cardiology

## 2011-01-25 VITALS — BP 140/78 | HR 68 | Wt 184.0 lb

## 2011-01-25 DIAGNOSIS — R079 Chest pain, unspecified: Secondary | ICD-10-CM

## 2011-01-25 DIAGNOSIS — E785 Hyperlipidemia, unspecified: Secondary | ICD-10-CM

## 2011-01-25 DIAGNOSIS — E119 Type 2 diabetes mellitus without complications: Secondary | ICD-10-CM

## 2011-01-25 DIAGNOSIS — E7800 Pure hypercholesterolemia, unspecified: Secondary | ICD-10-CM

## 2011-01-25 DIAGNOSIS — I119 Hypertensive heart disease without heart failure: Secondary | ICD-10-CM

## 2011-01-25 MED ORDER — NITROGLYCERIN 0.4 MG SL SUBL: 0.4000 mg | SUBLINGUAL_TABLET | SUBLINGUAL | Status: DC | PRN

## 2011-01-25 NOTE — Assessment & Plan Note (Signed)
Her blood pressure has been well-controlled recently

## 2011-01-25 NOTE — Progress Notes (Signed)
Whitney Newton Date of Birth:  1941/05/07 [facility] [address]   [address] [place], [place]  [address] [phone]           Fax   [phone]  History of Present Illness: This pleasant 70 year old woman is seen for a scheduled followup office visit.  She has a history of essential hypertension, diabetes mellitus, and dyslipidemia.  She's had a history of atypical chest pain but had a normal LexiScan Cardiolite stress test on 12/20/09.  There was no ischemia and her ejection fraction was 77%.  She has a history of elevated hemoglobin A1c in January her A1c was 9.7.  He is now down to 8.8.  Current Outpatient Prescriptions  Medication Sig Dispense Refill  . aspirin 81 MG tablet Take 81 mg by mouth daily.        . colesevelam (WELCHOL) 625 MG tablet Take 1,875 mg by mouth as needed.        . Diclofenac Sodium (VOLTAREN PO) Take by mouth. As needed       . furosemide (LASIX) 40 MG tablet Take 40 mg by mouth 2 (two) times daily. As needed      . hydrochlorothiazide 25 MG tablet Take 25 mg by mouth daily.        . hyoscyamine (LEVBID) 0.375 MG 12 hr tablet Take 0.375 mg by mouth every 12 (twelve) hours as needed.        . insulin glargine (LANTUS) 100 UNIT/ML injection Inject into the skin at bedtime. As directed      . levothyroxine (SYNTHROID, LEVOTHROID) 150 MCG tablet Take 150 mcg by mouth daily.        . meclizine (ANTIVERT) 50 MG tablet Take 25 mg by mouth 3 (three) times daily as needed.        . nitroGLYCERIN (NITROSTAT) 0.4 MG SL tablet Place 1 tablet (0.4 mg total) under the tongue every 5 (five) minutes as needed.  25 tablet  11  . Omeprazole-Sodium Bicarbonate (ZEGERID PO) Take by mouth as needed.        . Zinc 10 MG LOZG Use as directed in the mouth or throat.          Allergies  Allergen Reactions  . Crestor (Rosuvastatin Calcium)     LEG PAIN  . Lipitor (Atorvastatin Calcium)     CHEST PAIN, SOB  . Metformin And Related   . Niaspan (Niacin  (Antihyperlipidemic))     RASH  . Pravachol   . Ranexa     EDEMA  . Tricor   . Zetia (Ezetimibe)     NO SLEEP, SOB    Patient Active Problem List  Diagnoses  . Hypothyroidism  . Benign hypertensive heart disease without heart failure  . Dyslipidemia  . Diabetes mellitus  . Hemangioma of liver  . Postmenopausal state  . Chest pain  . Dizziness    History  Smoking status  . Never Smoker   Smokeless tobacco  . Not on file    History  Alcohol Use No    No family history on file.  Review of Systems: Constitutional: no fever chills diaphoresis or fatigue or change in weight.  Head and neck: no hearing loss, no epistaxis, no photophobia or visual disturbance. Respiratory: No cough, shortness of breath or wheezing. Cardiovascular: No chest pain peripheral edema, palpitations. Gastrointestinal: No abdominal distention, no abdominal pain, no change in bowel habits hematochezia or melena. Genitourinary: No dysuria, no frequency, no urgency, no  nocturia. Musculoskeletal:No arthralgias, no back pain, no gait disturbance or myalgias. Neurological: No dizziness, no headaches, no numbness, no seizures, no syncope, no weakness, no tremors. Hematologic: No lymphadenopathy, no easy bruising. Psychiatric: No confusion, no hallucinations, no sleep disturbance.    Physical Exam: Filed Vitals:   01/25/11 1511  BP: 140/78  Pulse: 68  The general appearance reveals a well-developed well-nourished woman in no distress.The head and neck exam reveals pupils equal and reactive.  Extraocular movements are full.  There is no scleral icterus.  The mouth and pharynx are normal.  The neck is supple.  The carotids reveal no bruits.  The jugular venous pressure is normal.  The  thyroid is not enlarged.  There is no lymphadenopathy.  The chest is clear to percussion and auscultation.  There are no rales or rhonchi.  Expansion of the chest is symmetrical.  The precordium is quiet.  The first heart  sound is normal.  The second heart sound is physiologically split.  There is no murmur gallop rub or click.  There is no abnormal lift or heave.The breasts reveal no masses per  The abdomen is soft and nontender.  The bowel sounds are normal.  The liver and spleen are not enlarged.  There are no abdominal masses.  There are no abdominal bruits.  Extremities reveal good pedal pulses.  There is no phlebitis or edema.  There is no cyanosis or clubbing.  Strength is normal and symmetrical in all extremities.  There is no lateralizing weakness.  There are no sensory deficits.  The skin is warm and dry.  There is no rash.     Assessment / Plan: Continue same medication.  Recheck in 4 months for followup office visit and lab work

## 2011-01-25 NOTE — Assessment & Plan Note (Signed)
The patient has a history of diabetes.  She had been having some early morning hypoglycemia but this has resolved when she cut back her dose of Lantus insulin

## 2011-01-25 NOTE — Assessment & Plan Note (Signed)
The patient has been successful in losing some weight and this has helped her lipid profile.

## 2011-01-25 NOTE — Assessment & Plan Note (Signed)
The patient has not had any recent problems with chest pain.

## 2011-03-01 LAB — BASIC METABOLIC PANEL
BUN: 9
CO2: 29
Chloride: 109
Glucose, Bld: 140 — ABNORMAL HIGH
Potassium: 3.8

## 2011-03-01 LAB — URINALYSIS, ROUTINE W REFLEX MICROSCOPIC
Hgb urine dipstick: NEGATIVE
Protein, ur: 30 — AB
Urobilinogen, UA: 1

## 2011-03-01 LAB — COMPREHENSIVE METABOLIC PANEL
AST: 29
Alkaline Phosphatase: 97
BUN: 15
CO2: 26
Chloride: 103
Creatinine, Ser: 0.82
GFR calc Af Amer: >60
GFR calc non Af Amer: >60
Total Bilirubin: 1.2

## 2011-03-01 LAB — TYPE AND SCREEN

## 2011-03-01 LAB — DIFFERENTIAL
Basophils Absolute: 0.1
Basophils Relative: 1
Eosinophils Absolute: 0.1
Eosinophils Relative: 1
Lymphocytes Relative: 27

## 2011-03-01 LAB — CBC
HCT: 45
Hemoglobin: 15.1 — ABNORMAL HIGH
RDW: 13.6

## 2011-03-01 LAB — OCCULT BLOOD X 1 CARD TO LAB, STOOL: Fecal Occult Bld: NEGATIVE

## 2011-03-01 LAB — URINE MICROSCOPIC-ADD ON

## 2011-03-01 LAB — D-DIMER, QUANTITATIVE: D-Dimer, Quant: 0.97 — ABNORMAL HIGH

## 2011-03-01 LAB — LIPASE, BLOOD: Lipase: 30

## 2011-03-07 ENCOUNTER — Other Ambulatory Visit: Payer: Self-pay | Admitting: Cardiology

## 2011-03-07 DIAGNOSIS — I119 Hypertensive heart disease without heart failure: Secondary | ICD-10-CM

## 2011-03-07 NOTE — Telephone Encounter (Signed)
Pt returning call from office.  Please call back

## 2011-03-08 ENCOUNTER — Telehealth: Payer: Self-pay | Admitting: Cardiology

## 2011-03-08 NOTE — Telephone Encounter (Signed)
Spoke with patient and she has been taking Ziac all along.  Did not get put in her EMR when abstracted

## 2011-03-08 NOTE — Telephone Encounter (Signed)
Spoke with patient and she has been taking this all along, just didn't get transferred over to EMR

## 2011-03-08 NOTE — Telephone Encounter (Signed)
Pt is calling you back about ziac please call

## 2011-04-16 ENCOUNTER — Telehealth: Payer: Self-pay | Admitting: Cardiology

## 2011-04-16 NOTE — Telephone Encounter (Signed)
Pt calling wanting to talk to someone about getting form filled out for pt to get diabetic shoes. Please return pt call to discuss further.

## 2011-04-16 NOTE — Telephone Encounter (Signed)
Advised she needs to get that form filled out by PCP

## 2011-04-24 ENCOUNTER — Telehealth: Payer: Self-pay | Admitting: Cardiology

## 2011-04-24 NOTE — Telephone Encounter (Signed)
Spoke with patient and advised to have them refax request

## 2011-04-24 NOTE — Telephone Encounter (Signed)
Pt calling to speak to [facility] regarding pt diabetic shoes, "the plan didn't work out". Please return pt call to discuss further.

## 2011-04-24 NOTE — Telephone Encounter (Signed)
Her PCP doesn't treat her diabetes and she would like for you to do Rx for diabetic shoes (have done in past)

## 2011-04-24 NOTE — Telephone Encounter (Signed)
Ok we can do it.

## 2011-04-26 ENCOUNTER — Telehealth: Payer: Self-pay | Admitting: Cardiology

## 2011-04-26 NOTE — Telephone Encounter (Signed)
Walk In pt Form " pt Fropped off paper for completion for Therapeutic shoes" sent to Message Nurse 04/26/11/km

## 2011-05-03 ENCOUNTER — Telehealth: Payer: Self-pay | Admitting: Cardiology

## 2011-05-03 NOTE — Telephone Encounter (Signed)
Advise husband form faxed

## 2011-05-03 NOTE — Telephone Encounter (Signed)
New message:  Pt left a form last Friday for Diabetic Shoes.  Please check on this and let her know if this is ready.  Does she need to pick it up or will it be faxed to pharmacy.  [facility].  Please call patient back and let her know, she would like to order today.

## 2011-05-10 ENCOUNTER — Other Ambulatory Visit: Payer: Self-pay | Admitting: Cardiology

## 2011-05-10 ENCOUNTER — Other Ambulatory Visit: Payer: Self-pay | Admitting: *Deleted

## 2011-05-10 ENCOUNTER — Other Ambulatory Visit (INDEPENDENT_AMBULATORY_CARE_PROVIDER_SITE_OTHER): Payer: Medicare Other | Admitting: *Deleted

## 2011-05-10 DIAGNOSIS — E7800 Pure hypercholesterolemia, unspecified: Secondary | ICD-10-CM

## 2011-05-10 DIAGNOSIS — E119 Type 2 diabetes mellitus without complications: Secondary | ICD-10-CM

## 2011-05-10 LAB — LIPID PANEL
Total CHOL/HDL Ratio: 4
VLDL: 22.2 mg/dL (ref 0.0–40.0)

## 2011-05-10 LAB — HEPATIC FUNCTION PANEL
Alkaline Phosphatase: 69 U/L (ref 39–117)
Bilirubin, Direct: 0.1 mg/dL (ref 0.0–0.3)
Total Bilirubin: 0.6 mg/dL (ref 0.3–1.2)

## 2011-05-10 LAB — BASIC METABOLIC PANEL
BUN: 14 mg/dL (ref 6–23)
CO2: 29 mEq/L (ref 19–32)
Chloride: 103 mEq/L (ref 96–112)
Creatinine, Ser: 0.7 mg/dL (ref 0.4–1.2)
Potassium: 3.5 mEq/L (ref 3.5–5.1)

## 2011-05-15 ENCOUNTER — Ambulatory Visit (INDEPENDENT_AMBULATORY_CARE_PROVIDER_SITE_OTHER): Payer: Medicare Other | Admitting: Cardiology

## 2011-05-15 ENCOUNTER — Telehealth: Payer: Self-pay | Admitting: Cardiology

## 2011-05-15 ENCOUNTER — Encounter: Payer: Self-pay | Admitting: Cardiology

## 2011-05-15 DIAGNOSIS — E7800 Pure hypercholesterolemia, unspecified: Secondary | ICD-10-CM

## 2011-05-15 DIAGNOSIS — E876 Hypokalemia: Secondary | ICD-10-CM

## 2011-05-15 DIAGNOSIS — E039 Hypothyroidism, unspecified: Secondary | ICD-10-CM

## 2011-05-15 DIAGNOSIS — M47812 Spondylosis without myelopathy or radiculopathy, cervical region: Secondary | ICD-10-CM

## 2011-05-15 DIAGNOSIS — R079 Chest pain, unspecified: Secondary | ICD-10-CM

## 2011-05-15 DIAGNOSIS — E785 Hyperlipidemia, unspecified: Secondary | ICD-10-CM

## 2011-05-15 DIAGNOSIS — E119 Type 2 diabetes mellitus without complications: Secondary | ICD-10-CM

## 2011-05-15 DIAGNOSIS — I119 Hypertensive heart disease without heart failure: Secondary | ICD-10-CM

## 2011-05-15 MED ORDER — POTASSIUM CHLORIDE CRYS ER 20 MEQ PO TBCR: EXTENDED_RELEASE_TABLET | ORAL | Status: DC

## 2011-05-15 NOTE — Assessment & Plan Note (Signed)
Unfortunately her lipids remain abnormal because of her inability to take any of the statin drugs.  She is able to take low dose WelChol.  It is hoped also that her lipid panel may improve if we can get her diabetes under better control.  She has been able to lose 3 pounds since last visit

## 2011-05-15 NOTE — Assessment & Plan Note (Signed)
Hemoglobin A1c remains elevated in the range of 9.0.  This is surprising to her because her morning blood sugars are usually normal or very close to normal.  We talked about how the hemoglobin A1c demonstrates what the average blood sugar is over the 24-hour period and obviously she must have a poorly controlled postprandial blood sugar level.  She is in the process of trying to find a new primary care physician.

## 2011-05-15 NOTE — Patient Instructions (Addendum)
Decrease HCTZ to 25 mg 1/2 tablet daily Your physician wants you to follow-up in: 4 months You will receive a reminder letter in the mail two months in advance. If you don't receive a letter, please call our office to schedule the follow-up appointment.

## 2011-05-15 NOTE — Assessment & Plan Note (Signed)
The patient is not having side effects from her blood pressure meds.  Pressure has been remaining stable.  We are cutting back on her hydrochlorothiazide to just half a tablet a day because of its effect on insulin and diabetic control.

## 2011-05-15 NOTE — Telephone Encounter (Signed)
She stated that her potassium was discussed at office visit this am and not sure if she needed to be on some or not.  Is on the lower side of normal and she takes her husbands potassium sometimes.  Please advise

## 2011-05-15 NOTE — Telephone Encounter (Signed)
Pt has questions re a med she was to be on every three days, but didn't get rx, and has question re lab report

## 2011-05-15 NOTE — Telephone Encounter (Signed)
Add  K. Dur 20 mEq 3 times a week on Monday Wednesday and Friday

## 2011-05-15 NOTE — Telephone Encounter (Signed)
Advised and sent to [facility]

## 2011-05-15 NOTE — Progress Notes (Signed)
Whitney Newton Date of Birth:  11-May-1941 [facility] / [facility] [address]   [address] [place], [place]  [address] [phone]           Fax   [phone]  History of Present Illness: This very pleasant 70 year old woman is seen for 4 month followup office visit.  She has a history of essential hypertension, diabetes mellitus and dyslipidemia.  She's had a history of atypical chest pain.  She had a normal lysis and Cardiolite stress test on 12/20/09.  Her ejection fraction was 77%.  She is a diabetic.  Her hemoglobin A1c in January 2012 was 9.7 and more recently in August was 8.8    she has his history of dyslipidemia but is intolerant of statins  Current Outpatient Prescriptions  Medication Sig Dispense Refill  . aspirin 81 MG tablet Take 81 mg by mouth daily.        . colesevelam (WELCHOL) 625 MG tablet Take 1,875 mg by mouth as needed.        . Diclofenac Sodium (VOLTAREN PO) Take by mouth. As needed       . furosemide (LASIX) 40 MG tablet Take 40 mg by mouth 2 (two) times daily. As needed      . hydrochlorothiazide 25 MG tablet Take 25 mg by mouth as directed. 1/2 tablet daily      . hyoscyamine (LEVBID) 0.375 MG 12 hr tablet Take 0.375 mg by mouth every 12 (twelve) hours as needed.        . insulin glargine (LANTUS) 100 UNIT/ML injection Inject into the skin at bedtime. As directed      . levothyroxine (SYNTHROID, LEVOTHROID) 150 MCG tablet Take 150 mcg by mouth daily.        . meclizine (ANTIVERT) 50 MG tablet Take 25 mg by mouth 3 (three) times daily as needed.        . nitroGLYCERIN (NITROSTAT) 0.4 MG SL tablet Place 1 tablet (0.4 mg total) under the tongue every 5 (five) minutes as needed.  25 tablet  11  . Omeprazole-Sodium Bicarbonate (ZEGERID PO) Take by mouth as needed.        Marland Kitchen ZIAC 10-6.25 MG per tablet TAKE (1) TABLET DAILY AS DIRECTED.  30 each  11  . Zinc 10 MG LOZG Use as directed in the mouth or throat.        . potassium chloride SA  (K-DUR,KLOR-CON) 20 MEQ tablet 1 tablet on Monday, Wednesday, and Friday or as directed  30 tablet  11    Allergies  Allergen Reactions  . Crestor (Rosuvastatin Calcium)     LEG PAIN  . Lipitor (Atorvastatin Calcium)     CHEST PAIN, SOB  . Metformin And Related   . Niaspan (Niacin (Antihyperlipidemic))     RASH  . Pravachol   . Ranexa     EDEMA  . Tricor   . Zetia (Ezetimibe)     NO SLEEP, SOB    Patient Active Problem List  Diagnoses  . Hypothyroidism  . Benign hypertensive heart disease without heart failure  . Dyslipidemia  . Diabetes mellitus  . Hemangioma of liver  . Postmenopausal state  . Chest pain  . Dizziness    History  Smoking status  . Never Smoker   Smokeless tobacco  . Not on file    History  Alcohol Use No    No family history on file.  Review of Systems: Constitutional: no fever chills diaphoresis or fatigue or  change in weight.  Head and neck: no hearing loss, no epistaxis, no photophobia or visual disturbance. Respiratory: No cough, shortness of breath or wheezing. Cardiovascular: No chest pain peripheral edema, palpitations. Gastrointestinal: No abdominal distention, no abdominal pain, no change in bowel habits hematochezia or melena. Genitourinary: No dysuria, no frequency, no urgency, no nocturia. Musculoskeletal:No arthralgias, no back pain, no gait disturbance or myalgias. Neurological: No dizziness, no headaches, no numbness, no seizures, no syncope, no weakness, no tremors. Hematologic: No lymphadenopathy, no easy bruising. Psychiatric: No confusion, no hallucinations, no sleep disturbance.    Physical Exam: Filed Vitals:   05/15/11 0901  BP: 110/70  Pulse: 78   the general appearance reveals a well-developed well-nourished woman in no distress.Pupils equal and reactive.   Extraocular Movements are full.  There is no scleral icterus.  The mouth and pharynx are normal.  The neck is supple.  The carotids reveal no bruits.  The  jugular venous pressure is normal.  The thyroid is not enlarged.  There is no lymphadenopathy.  The chest is clear to percussion and auscultation. There are no rales or rhonchi. Expansion of the chest is symmetrical.  The precordium is quiet.  The first heart sound is normal.  The second heart sound is physiologically split.  There is no murmur gallop rub or click.  There is no abnormal lift or heave.  The abdomen is soft and nontender. Bowel sounds are normal. The liver and spleen are not enlarged. There Are no abdominal masses. There are no bruits.  Extremities no phlebitis.  She does have trace edema.  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  There are no ischemic changes   Assessment / Plan: Continue same medication except decrease hydrochlorothiazide to one half tablet daily.  Continue Lantus insulin.  She has been experiencing some chronic left arm discomfort which is not cardiac.  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  It worsens she will see her neurosurgeon and/or neurologist again at [facility]

## 2011-06-14 ENCOUNTER — Telehealth: Payer: Self-pay | Admitting: Cardiology

## 2011-06-14 NOTE — Telephone Encounter (Signed)
Discussed getting PCP with patient

## 2011-06-14 NOTE — Telephone Encounter (Signed)
New msg Pt wants a referral for primary care physician. Please let her know

## 2011-06-19 ENCOUNTER — Ambulatory Visit (INDEPENDENT_AMBULATORY_CARE_PROVIDER_SITE_OTHER): Payer: Medicare Other

## 2011-06-19 DIAGNOSIS — R109 Unspecified abdominal pain: Secondary | ICD-10-CM

## 2011-06-19 DIAGNOSIS — E119 Type 2 diabetes mellitus without complications: Secondary | ICD-10-CM

## 2011-06-19 DIAGNOSIS — I10 Essential (primary) hypertension: Secondary | ICD-10-CM

## 2011-06-24 ENCOUNTER — Other Ambulatory Visit: Payer: Self-pay | Admitting: Emergency Medicine

## 2011-06-26 ENCOUNTER — Ambulatory Visit
Admission: RE | Admit: 2011-06-26 | Discharge: 2011-06-26 | Disposition: A | Discharge status: Home / Self Care | Payer: Medicare Other | Source: Ambulatory Visit | Attending: Emergency Medicine | Admitting: Emergency Medicine

## 2011-06-26 MED ORDER — IOHEXOL 300 MG/ML  SOLN
100.0000 mL | Freq: Once | INTRAMUSCULAR | Status: AC | PRN
  Administered 2011-06-26: 100 mL via INTRAVENOUS

## 2011-07-18 DIAGNOSIS — G9520 Unspecified cord compression: Secondary | ICD-10-CM | POA: Insufficient documentation

## 2011-07-18 DIAGNOSIS — M5412 Radiculopathy, cervical region: Secondary | ICD-10-CM | POA: Insufficient documentation

## 2011-07-30 ENCOUNTER — Encounter: Payer: Self-pay | Admitting: Cardiology

## 2011-08-07 DIAGNOSIS — G5600 Carpal tunnel syndrome, unspecified upper limb: Secondary | ICD-10-CM | POA: Insufficient documentation

## 2011-08-07 DIAGNOSIS — G543 Thoracic root disorders, not elsewhere classified: Secondary | ICD-10-CM | POA: Insufficient documentation

## 2011-08-15 DIAGNOSIS — E782 Mixed hyperlipidemia: Secondary | ICD-10-CM | POA: Insufficient documentation

## 2011-08-15 DIAGNOSIS — E1169 Type 2 diabetes mellitus with other specified complication: Secondary | ICD-10-CM | POA: Insufficient documentation

## 2011-08-15 DIAGNOSIS — E119 Type 2 diabetes mellitus without complications: Secondary | ICD-10-CM | POA: Insufficient documentation

## 2011-09-05 ENCOUNTER — Other Ambulatory Visit (INDEPENDENT_AMBULATORY_CARE_PROVIDER_SITE_OTHER): Payer: Medicare Other

## 2011-09-05 DIAGNOSIS — I119 Hypertensive heart disease without heart failure: Secondary | ICD-10-CM

## 2011-09-05 DIAGNOSIS — E7800 Pure hypercholesterolemia, unspecified: Secondary | ICD-10-CM

## 2011-09-05 DIAGNOSIS — E119 Type 2 diabetes mellitus without complications: Secondary | ICD-10-CM

## 2011-09-05 LAB — HEPATIC FUNCTION PANEL
ALT: 12 U/L (ref 0–35)
Alkaline Phosphatase: 68 U/L (ref 39–117)
Bilirubin, Direct: 0.1 mg/dL (ref 0.0–0.3)
Total Bilirubin: 0.5 mg/dL (ref 0.3–1.2)
Total Protein: 6.6 g/dL (ref 6.0–8.3)

## 2011-09-05 LAB — BASIC METABOLIC PANEL
BUN: 17 mg/dL (ref 6–23)
Chloride: 105 mEq/L (ref 96–112)
Creatinine, Ser: 0.7 mg/dL (ref 0.4–1.2)
GFR: 86.23 mL/min (ref >60.00)
Glucose, Bld: 74 mg/dL (ref 70–99)

## 2011-09-05 LAB — LIPID PANEL
Cholesterol: 189 mg/dL (ref 0–200)
LDL Cholesterol: 114 mg/dL — ABNORMAL HIGH (ref 0–99)
VLDL: 16 mg/dL (ref 0.0–40.0)

## 2011-09-05 NOTE — Progress Notes (Signed)
Quick Note:  Please make copy of labs for patient visit. ______ 

## 2011-09-09 DIAGNOSIS — R600 Localized edema: Secondary | ICD-10-CM | POA: Insufficient documentation

## 2011-09-12 ENCOUNTER — Ambulatory Visit (INDEPENDENT_AMBULATORY_CARE_PROVIDER_SITE_OTHER): Payer: Medicare Other | Admitting: Cardiology

## 2011-09-12 ENCOUNTER — Encounter: Payer: Self-pay | Admitting: Cardiology

## 2011-09-12 VITALS — BP 138/78 | HR 66 | Ht 65.0 in | Wt 190.0 lb

## 2011-09-12 DIAGNOSIS — D1803 Hemangioma of intra-abdominal structures: Secondary | ICD-10-CM

## 2011-09-12 DIAGNOSIS — E119 Type 2 diabetes mellitus without complications: Secondary | ICD-10-CM

## 2011-09-12 DIAGNOSIS — R079 Chest pain, unspecified: Secondary | ICD-10-CM

## 2011-09-12 DIAGNOSIS — E7800 Pure hypercholesterolemia, unspecified: Secondary | ICD-10-CM

## 2011-09-12 DIAGNOSIS — I119 Hypertensive heart disease without heart failure: Secondary | ICD-10-CM

## 2011-09-12 NOTE — Assessment & Plan Note (Signed)
Blood pressure has been stable on current medicines.  No recent dizziness or syncope.  No palpitations.

## 2011-09-12 NOTE — Assessment & Plan Note (Signed)
The patient has occasional early morning hypoglycemic symptoms.

## 2011-09-12 NOTE — Assessment & Plan Note (Signed)
The patient has had very little angina in the past several months.  She has not had any recent sublingual nitroglycerin.

## 2011-09-12 NOTE — Assessment & Plan Note (Signed)
The patient was sent by her gastroenterologist Dr. Randa Evens over to [facility] in regard to an enlarging hepatic mass which is a hemangioma.  The surgeon that she saw at [facility] gave her the choice of surgery versus conservative therapy.  At the present time she is electing conservative therapy.

## 2011-09-12 NOTE — Progress Notes (Signed)
Whitney Newton Date of Birth:  [birth year] [facility] [address] [place], [place]  [address] [phone]         Fax   [phone]  History of Present Illness: This pleasant 71 year old woman is seen for a scheduled followup office visit.  She has a complex past medical history.  She has a history of essential hypertension, diabetes mellitus, and dyslipidemia.  She has also had a past history of atypical chest pain.  She had a normal Cardiolite stress test in July 2011.  Her ejection fraction was 77%.  She does have a history of dyslipidemia but is intolerant of statins.  Despite normal blood sugar readings at home her hemoglobin A1c remains above 8.  Current Outpatient Prescriptions  Medication Sig Dispense Refill  . aspirin 81 MG tablet Take 81 mg by mouth daily.        . colesevelam (WELCHOL) 625 MG tablet Take 1,875 mg by mouth as needed.        . Diclofenac Sodium (VOLTAREN PO) Take by mouth. As needed       . furosemide (LASIX) 40 MG tablet Take 40 mg by mouth 2 (two) times daily. As needed      . hydrochlorothiazide 25 MG tablet Take 25 mg by mouth as directed. 1/2 tablet daily      . hyoscyamine (LEVBID) 0.375 MG 12 hr tablet Take 0.375 mg by mouth every 12 (twelve) hours as needed.        . insulin glargine (LANTUS) 100 UNIT/ML injection Inject into the skin at bedtime. As directed      . levothyroxine (SYNTHROID, LEVOTHROID) 150 MCG tablet Take 150 mcg by mouth daily.        . meclizine (ANTIVERT) 50 MG tablet Take 25 mg by mouth 3 (three) times daily as needed.        . nitroGLYCERIN (NITROSTAT) 0.4 MG SL tablet Place 1 tablet (0.4 mg total) under the tongue every 5 (five) minutes as needed.  25 tablet  11  . Omeprazole-Sodium Bicarbonate (ZEGERID PO) Take by mouth as needed.        . potassium chloride SA (K-DUR,KLOR-CON) 20 MEQ tablet 1 tablet on Monday, Wednesday, and Friday or as directed  30 tablet  11  . ZIAC 10-6.25 MG per tablet TAKE (1) TABLET  DAILY AS DIRECTED.  30 each  11  . Zinc 10 MG LOZG Use as directed in the mouth or throat.        . gabapentin (NEURONTIN) 100 MG capsule as directed.        Allergies  Allergen Reactions  . Actos (Pioglitazone Hydrochloride)     swelling  . Crestor (Rosuvastatin Calcium)     LEG PAIN  . Lipitor (Atorvastatin Calcium)     CHEST PAIN, SOB  . Metformin And Related   . Niaspan (Niacin (Antihyperlipidemic))     RASH  . Pravachol   . Ranexa     EDEMA  . Tricor   . Zetia (Ezetimibe)     NO SLEEP, SOB    Patient Active Problem List  Diagnoses  . Hypothyroidism  . Benign hypertensive heart disease without heart failure  . Dyslipidemia  . Diabetes mellitus  . Hemangioma of liver  . Postmenopausal state  . Chest pain  . Dizziness    History  Smoking status  . Never Smoker   Smokeless tobacco  . Not on file    History  Alcohol Use No  No family history on file.  Review of Systems: Constitutional: no fever chills diaphoresis or fatigue or change in weight.  Head and neck: no hearing loss, no epistaxis, no photophobia or visual disturbance. Respiratory: No cough, shortness of breath or wheezing. Cardiovascular: No chest pain peripheral edema, palpitations. Gastrointestinal: No abdominal distention, no abdominal pain, no change in bowel habits hematochezia or melena. Genitourinary: No dysuria, no frequency, no urgency, no nocturia. Musculoskeletal:No arthralgias, no back pain, no gait disturbance or myalgias. Neurological: No dizziness, no headaches, no numbness, no seizures, no syncope, no weakness, no tremors. Hematologic: No lymphadenopathy, no easy bruising. Psychiatric: No confusion, no hallucinations, no sleep disturbance.    Physical Exam: Filed Vitals:   09/12/11 0856  BP: 138/78  Pulse: 66   the general appearance reveals a well-developed well-nourished woman in no distress.The head and neck exam reveals pupils equal and reactive.  Extraocular  movements are full.  There is no scleral icterus.  The mouth and pharynx are normal.  The neck is supple.  The carotids reveal no bruits.  The jugular venous pressure is normal.  The  thyroid is not enlarged.  There is no lymphadenopathy.  The chest is clear to percussion and auscultation.  There are no rales or rhonchi.  Expansion of the chest is symmetrical.  The precordium is quiet.  The first heart sound is normal.  The second heart sound is physiologically split.  There is no murmur gallop rub or click.  There is no abnormal lift or heave.  The abdomen is soft and nontender.  The bowel sounds are normal.  The liver and spleen are not enlarged.  There are no abdominal masses.  There are no abdominal bruits.  Extremities reveal good pedal pulses.  There is no phlebitis or edema.  There is no cyanosis or clubbing.  Strength is normal and symmetrical in all extremities.  There is no lateralizing weakness.  There are no sensory deficits.  The skin is warm and dry.  There is no rash.     Assessment / Plan: The patient is to continue same medication.  Recheck in 4 months for followup office visit including lipid panel hepatic function panel basal metabolic panel and hemoglobin M5H.  Her weight is up 9 pounds since previous visit and she will work hard to get this additional weight off and this will help her diabetic control.

## 2011-09-12 NOTE — Patient Instructions (Signed)
Work on weight loss.  Eat fewer calories and try to walk 20-30 minutes per day.  Your physician wants you to follow-up in: 4 months with Dr. [name]. You will receive a reminder letter in the mail two months in advance. If you don't receive a letter, please call our office to schedule the follow-up appointment.  Your physician recommends that you return for lab work in:  At your 4 month office visit.  Lipids, Liver, Bmet, and Hgb A1C.

## 2011-09-15 ENCOUNTER — Ambulatory Visit (INDEPENDENT_AMBULATORY_CARE_PROVIDER_SITE_OTHER): Payer: Medicare Other | Admitting: Emergency Medicine

## 2011-09-15 VITALS — BP 136/75 | HR 61 | Temp 98.2°F | Resp 16 | Ht 64.0 in | Wt 188.0 lb

## 2011-09-15 DIAGNOSIS — K769 Liver disease, unspecified: Secondary | ICD-10-CM

## 2011-09-15 DIAGNOSIS — R160 Hepatomegaly, not elsewhere classified: Secondary | ICD-10-CM

## 2011-09-15 DIAGNOSIS — E119 Type 2 diabetes mellitus without complications: Secondary | ICD-10-CM

## 2011-09-15 DIAGNOSIS — R1900 Intra-abdominal and pelvic swelling, mass and lump, unspecified site: Secondary | ICD-10-CM

## 2011-09-15 NOTE — Progress Notes (Signed)
  Subjective:    Patient ID: Whitney Newton, female    DOB: 24-Jun-1940, 71 y.o.   MRN: [id number]  HPI has an area in her left upper abdomen she needs checked. 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 This was performed at [facility]. She enters here today with this in the area beneath the left costal margin she states is firm and tender and has a burning sensation. She also has questions regarding the treatment of her diabetes. She also is requesting a switch to our practice for primary care.    Review of Systems noncontributory except as relates to the present illness     Objective:   Physical Exam there is an irregular half by half centimeter area beneath the left costal margin which is only able to be felt with the patient standing. This area is tender to the patient        Assessment & Plan:  Patient has a lesion left upper abdomen will refer her back to her Mohs surgeon and see what needs to be done. I told her she needs to followup with her regular primary care physician Dr. Lupe Carney.We who will be making a referral to dermatology.

## 2011-09-24 DIAGNOSIS — D485 Neoplasm of uncertain behavior of skin: Secondary | ICD-10-CM | POA: Insufficient documentation

## 2011-10-03 ENCOUNTER — Other Ambulatory Visit: Payer: Self-pay | Admitting: Cardiology

## 2011-10-31 ENCOUNTER — Other Ambulatory Visit: Payer: Self-pay | Admitting: Cardiology

## 2011-10-31 DIAGNOSIS — E119 Type 2 diabetes mellitus without complications: Secondary | ICD-10-CM

## 2011-11-21 ENCOUNTER — Telehealth: Payer: Self-pay | Admitting: Cardiology

## 2011-11-21 NOTE — Telephone Encounter (Signed)
Noted  

## 2011-11-21 NOTE — Telephone Encounter (Signed)
F/u   Please disregard, confirmed paperwork is at the front desk waiting for patient pickup.

## 2011-11-21 NOTE — Telephone Encounter (Signed)
Please return call to patient at [phone] regarding handicap license plate documentation.

## 2012-01-16 ENCOUNTER — Ambulatory Visit: Payer: Medicare Other | Admitting: Cardiology

## 2012-01-16 ENCOUNTER — Other Ambulatory Visit: Payer: Medicare Other

## 2012-01-17 ENCOUNTER — Other Ambulatory Visit (INDEPENDENT_AMBULATORY_CARE_PROVIDER_SITE_OTHER): Payer: Medicare Other

## 2012-01-17 DIAGNOSIS — E7800 Pure hypercholesterolemia, unspecified: Secondary | ICD-10-CM

## 2012-01-17 DIAGNOSIS — E119 Type 2 diabetes mellitus without complications: Secondary | ICD-10-CM

## 2012-01-17 LAB — BASIC METABOLIC PANEL
Calcium: 9.8 mg/dL (ref 8.4–10.5)
GFR: 108.78 mL/min (ref >60.00)
Glucose, Bld: 197 mg/dL — ABNORMAL HIGH (ref 70–99)
Potassium: 3.8 mEq/L (ref 3.5–5.1)
Sodium: 137 mEq/L (ref 135–145)

## 2012-01-17 LAB — HEMOGLOBIN A1C: Hgb A1c MFr Bld: 8.8 % — ABNORMAL HIGH (ref 4.6–6.5)

## 2012-01-17 LAB — LIPID PANEL
Cholesterol: 238 mg/dL — ABNORMAL HIGH (ref 0–200)
HDL: 52.1 mg/dL (ref >39.00)
Triglycerides: 97 mg/dL (ref 0.0–149.0)

## 2012-01-17 LAB — HEPATIC FUNCTION PANEL
ALT: 18 U/L (ref 0–35)
AST: 17 U/L (ref 0–37)
Albumin: 3.7 g/dL (ref 3.5–5.2)
Total Bilirubin: 0.9 mg/dL (ref 0.3–1.2)
Total Protein: 6.6 g/dL (ref 6.0–8.3)

## 2012-01-17 LAB — LDL CHOLESTEROL, DIRECT: Direct LDL: 168.5 mg/dL

## 2012-01-17 NOTE — Progress Notes (Signed)
Quick Note:  Please make copy of labs for patient visit. ______ 

## 2012-01-20 ENCOUNTER — Other Ambulatory Visit: Payer: Medicare Other

## 2012-01-21 ENCOUNTER — Ambulatory Visit
Admission: RE | Admit: 2012-01-21 | Discharge: 2012-01-21 | Disposition: A | Discharge status: Home / Self Care | Payer: Medicare Other | Source: Ambulatory Visit | Attending: Cardiology | Admitting: Cardiology

## 2012-01-21 ENCOUNTER — Ambulatory Visit (INDEPENDENT_AMBULATORY_CARE_PROVIDER_SITE_OTHER): Payer: Medicare Other | Admitting: Cardiology

## 2012-01-21 ENCOUNTER — Other Ambulatory Visit: Payer: Medicare Other

## 2012-01-21 ENCOUNTER — Encounter: Payer: Self-pay | Admitting: Cardiology

## 2012-01-21 VITALS — BP 148/88 | HR 76 | Ht 64.0 in | Wt 187.1 lb

## 2012-01-21 DIAGNOSIS — IMO0001 Reserved for inherently not codable concepts without codable children: Secondary | ICD-10-CM

## 2012-01-21 DIAGNOSIS — R0609 Other forms of dyspnea: Secondary | ICD-10-CM

## 2012-01-21 DIAGNOSIS — I119 Hypertensive heart disease without heart failure: Secondary | ICD-10-CM

## 2012-01-21 DIAGNOSIS — D1803 Hemangioma of intra-abdominal structures: Secondary | ICD-10-CM

## 2012-01-21 NOTE — Assessment & Plan Note (Signed)
The patient has had a higher than usual intake of dietary salt over the summer because of being away from home and having to eat restaurant food.

## 2012-01-21 NOTE — Assessment & Plan Note (Signed)
Some of her malaise and fatigue may reflect the fact that her diabetes has been under poor control over the past several months.

## 2012-01-21 NOTE — Progress Notes (Signed)
Whitney Newton Date of Birth:  [birth year] [facility] [address] [place], [place]  [address] [phone]         Fax   [phone]  History of Present Illness: This pleasant 71 year old woman is seen for a scheduled followup visit.  She has a history of essential hypertension, diabetes mellitus, and dyslipidemia.  She has had atypical chest pain.  She had a normal Cardiolite stress test in July 2011.  She is intolerant of statin drugs.  She has had a very busy summer.  [place] took trips [place] and trips to the beach and also trips out to [place].  While on her trips she was off her diabetic diet and today's lab work shows worsening of her cholesterol and her blood sugars and her A1c.  She has been feeling very exhausted and short of breath recently.  She is overdue for a chest x-ray which we will obtain today.  Current Outpatient Prescriptions  Medication Sig Dispense Refill  . aspirin 81 MG tablet Take 81 mg by mouth daily.        . colesevelam (WELCHOL) 625 MG tablet Take 1,875 mg by mouth as needed.        . furosemide (LASIX) 40 MG tablet Take 40 mg by mouth 2 (two) times daily. As needed      . gabapentin (NEURONTIN) 100 MG capsule as directed.      . hydrochlorothiazide (HYDRODIURIL) 25 MG tablet TAKE 1 TABLET EACH DAY.  90 tablet  3  . hyoscyamine (LEVBID) 0.375 MG 12 hr tablet Take 0.375 mg by mouth every 12 (twelve) hours as needed.        Marland Kitchen LANTUS 100 UNIT/ML injection INJECT 50 UNITS DAILY OR AS DIRECTED.  10 mL  3  . levothyroxine (SYNTHROID, LEVOTHROID) 150 MCG tablet Take 150 mcg by mouth daily.        . nitroGLYCERIN (NITROSTAT) 0.4 MG SL tablet Place 1 tablet (0.4 mg total) under the tongue every 5 (five) minutes as needed.  25 tablet  11  . Omeprazole-Sodium Bicarbonate (ZEGERID PO) Take by mouth as needed.        . potassium chloride SA (K-DUR,KLOR-CON) 20 MEQ tablet 1 tablet on Monday, Wednesday, and Friday or as directed  30 tablet   11  . ZIAC 10-6.25 MG per tablet TAKE (1) TABLET DAILY AS DIRECTED.  30 each  11  . Zinc 10 MG LOZG Use as directed in the mouth or throat.          Allergies  Allergen Reactions  . Actos (Pioglitazone Hydrochloride)     swelling  . Crestor (Rosuvastatin Calcium)     LEG PAIN  . Fenofibrate   . Lipitor (Atorvastatin Calcium)     CHEST PAIN, SOB  . Metformin And Related   . Niaspan (Niacin Er)     RASH  . Pravachol   . Ranolazine Er     EDEMA  . Zetia (Ezetimibe)     NO SLEEP, SOB    Patient Active Problem List  Diagnosis  . Hypothyroidism  . Benign hypertensive heart disease without heart failure  . Dyslipidemia  . Diabetes mellitus  . Hemangioma of liver  . Postmenopausal state  . Chest pain  . Dizziness    History  Smoking status  . Never Smoker   Smokeless tobacco  . Not on file    History  Alcohol Use No    History reviewed. No pertinent family  history.  Review of Systems: Constitutional: no fever chills diaphoresis or fatigue or change in weight.  Head and neck: no hearing loss, no epistaxis, no photophobia or visual disturbance. Respiratory: No cough, shortness of breath or wheezing. Cardiovascular: No chest pain peripheral edema, palpitations. Gastrointestinal: No abdominal distention, no abdominal pain, no change in bowel habits hematochezia or melena. Genitourinary: No dysuria, no frequency, no urgency, no nocturia. Musculoskeletal:No arthralgias, no back pain, no gait disturbance or myalgias. Neurological: No dizziness, no headaches, no numbness, no seizures, no syncope, no weakness, no tremors. Hematologic: No lymphadenopathy, no easy bruising. Psychiatric: No confusion, no hallucinations, no sleep disturbance.    Physical Exam: Filed Vitals:   01/21/12 0942  BP: 148/88  Pulse: 76   the general appearance reveals a well-developed well-nourished woman in no distress.The head and neck exam reveals pupils equal and reactive.  Extraocular  movements are full.  There is no scleral icterus.  The mouth and pharynx are normal.  The neck is supple.  The carotids reveal no bruits.  The jugular venous pressure is normal.  The  thyroid is not enlarged.  There is no lymphadenopathy.  The chest is clear to percussion and auscultation.  There are no rales or rhonchi.  Expansion of the chest is symmetrical.  The precordium is quiet.  The first heart sound is normal.  The second heart sound is physiologically split.  There is no murmur gallop rub or click.  There is no abnormal lift or heave.  The abdomen is soft and nontender.  The bowel sounds are normal.  The liver and spleen are not enlarged.  There are no abdominal masses.  There are no abdominal bruits.  Extremities reveal good pedal pulses.  There is no phlebitis or edema.  There is no cyanosis or clubbing.  Strength is normal and symmetrical in all extremities.  There is no lateralizing weakness.  There are no sensory deficits.  The skin is warm and dry.  There is no rash.     Assessment / Plan: Into the same medication.  Get chest x-ray to evaluate dyspnea and fatigue.  We reviewed her blood work today.  Recheck in 4 months for office visit CBC panel function panel nasal metabolic panel and A1c

## 2012-01-21 NOTE — Assessment & Plan Note (Signed)
The patient has a history of hemangioma of the liver.  It is apparently quite large and her surgeon at [facility] gave her a 50-50 percentage on the likelihood of a successful outcome.  The patient has elected to follow the conservative approach at this point and this seems reasonable

## 2012-01-21 NOTE — Patient Instructions (Addendum)
Will have you go for chest xray and call you with the results  Your physician recommends that you continue on your current medications as directed. Please refer to the Current Medication list given to you today.  Your physician recommends that you schedule a follow-up appointment in: 4 months with fasting labs (lp/bmet/hfp/a1c)

## 2012-01-22 ENCOUNTER — Telehealth: Payer: Self-pay | Admitting: *Deleted

## 2012-01-22 ENCOUNTER — Other Ambulatory Visit: Payer: Self-pay | Admitting: Cardiology

## 2012-01-22 NOTE — Telephone Encounter (Signed)
Advised of xray 

## 2012-01-22 NOTE — Telephone Encounter (Signed)
Message copied by Burnell Blanks on Wed Jan 22, 2012 12:20 PM ------      Message from: Cassell Clement      Created: Tue Jan 21, 2012  5:12 PM       Please report.  The chest x-ray does not show any evidence of congestive heart failure.  The heart size is normal.  There is mild peribronchial thickening which would suggest possibly some prior bronchitis history.

## 2012-03-03 ENCOUNTER — Other Ambulatory Visit: Payer: Self-pay | Admitting: Cardiology

## 2012-03-16 ENCOUNTER — Telehealth: Payer: Self-pay | Admitting: Cardiology

## 2012-03-16 ENCOUNTER — Other Ambulatory Visit: Payer: Self-pay | Admitting: *Deleted

## 2012-03-16 ENCOUNTER — Ambulatory Visit (INDEPENDENT_AMBULATORY_CARE_PROVIDER_SITE_OTHER): Payer: Medicare Other | Admitting: Nurse Practitioner

## 2012-03-16 ENCOUNTER — Encounter: Payer: Self-pay | Admitting: Nurse Practitioner

## 2012-03-16 VITALS — BP 146/80 | HR 69 | Ht 64.0 in | Wt 192.8 lb

## 2012-03-16 DIAGNOSIS — R079 Chest pain, unspecified: Secondary | ICD-10-CM

## 2012-03-16 DIAGNOSIS — R0600 Dyspnea, unspecified: Secondary | ICD-10-CM

## 2012-03-16 DIAGNOSIS — R0609 Other forms of dyspnea: Secondary | ICD-10-CM

## 2012-03-16 LAB — CK TOTAL AND CKMB (NOT AT ARMC)
CK, MB: 2 ng/mL (ref 0.3–4.0)
Total CK: 37 U/L (ref 7–177)

## 2012-03-16 LAB — TROPONIN I: Troponin I: <0.3 ng/mL (ref <0.30)

## 2012-03-16 NOTE — Patient Instructions (Addendum)
Stay on your current medicines  We are going to check labs today  We are going to arrange for a stress test (Lexiscan) this week  Use your NTG under your tongue for recurrent chest pain. May take one tablet every 5 minutes. If you are still having discomfort after 3 tablets in 15 minutes, call 911.  If your stress test turns out ok and you are still having issues - call us back and we will need to do more testing.   Call the [facility] office at [phone] if you have any questions, problems or concerns.

## 2012-03-16 NOTE — Progress Notes (Signed)
Whitney Newton Date of Birth: 1940-07-21 Medical Record #[id number]  History of Present Illness: Whitney Newton is seen today for a work in visit. She is seen for Dr. [name]. She has a history of HTN, HLD and diabetes. No known CAD. Negative stress test in 2011. She has had atypical chest pain in the past. 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 She reports that she has some 10th nerve involvement.   She comes in today. She is here alone. She was last seen in August. Was complaining of dyspnea and fatigue. CXR was negative for CHF. Today she says she has just not been feeling well. She is short of breath. She has had some "light" chest pain in the left side of her chest. Sometimes it is in her left arm but in different places. Not really exertional. Will last for 5 to 10 minutes. She did 4 days of exercise classes last week and thought she may have overdone it.  She has no energy. Not using any NTG but does have it on hand. Weight is up. No real swelling. No cough.   Current Outpatient Prescriptions on File Prior to Visit  Medication Sig Dispense Refill  . aspirin 81 MG tablet Take 81 mg by mouth daily.        . colesevelam (WELCHOL) 625 MG tablet Take 1,875 mg by mouth as needed.        . furosemide (LASIX) 40 MG tablet Take 40 mg by mouth 2 (two) times daily. As needed      . gabapentin (NEURONTIN) 100 MG capsule as directed.      . hydrochlorothiazide (HYDRODIURIL) 25 MG tablet TAKE 1 TABLET EACH DAY.  90 tablet  3  . hyoscyamine (LEVBID) 0.375 MG 12 hr tablet Take 0.375 mg by mouth every 12 (twelve) hours as needed.        Marland Kitchen LANTUS 100 UNIT/ML injection INJECT 50 UNITS DAILY OR AS DIRECTED.  10 mL  prn  . levothyroxine (SYNTHROID, LEVOTHROID) 150 MCG tablet Take 150 mcg by mouth daily.        . nitroGLYCERIN (NITROSTAT) 0.4 MG SL tablet Place 1 tablet (0.4 mg total) under the tongue every 5 (five) minutes as needed.  25 tablet  11  .  Omeprazole-Sodium Bicarbonate (ZEGERID PO) Take by mouth as needed.        . potassium chloride SA (K-DUR,KLOR-CON) 20 MEQ tablet 1 tablet on Monday, Wednesday, and Friday or as directed  30 tablet  11  . ZIAC 10-6.25 MG per tablet TAKE (1) TABLET DAILY AS DIRECTED.  30 tablet  12  . Zinc 10 MG LOZG Use as directed in the mouth or throat.          Allergies  Allergen Reactions  . Actos (Pioglitazone Hydrochloride)     swelling  . Crestor (Rosuvastatin Calcium)     LEG PAIN  . Fenofibrate   . Lipitor (Atorvastatin Calcium)     CHEST PAIN, SOB  . Metformin And Related   . Niaspan (Niacin Er)     RASH  . Pravachol   . Ranolazine Er     EDEMA  . Zetia (Ezetimibe)     NO SLEEP, SOB    Past Medical History  Diagnosis Date  . Hypertension   . Thyroid disease     hypothyroidism  . Gastritis   . Diverticulitis   . Back pain     prior back  surgery in 2009 - slow to recover  . Diabetes   . HLD (hyperlipidemia)     statin intolerant  . Normal cardiac stress test 2011  . Obesity   . Hemangioma of liver     managed conservatively; followed at [facility]    Past Surgical History  Procedure Date  . Abdominal hysterectomy   . Nasal reconstruction   . Appendectomy     History  Smoking status  . Never Smoker   Smokeless tobacco  . Not on file    History  Alcohol Use No    Family History  Problem Relation Age of Onset  . Heart disease Father   . Heart disease Brother     Review of Systems: The review of systems is per the HPI.  All other systems were reviewed and are negative.  Physical Exam: BP 146/80  Pulse 69  Ht 5\' 4"  (1.626 m)  Wt 192 lb 12.8 oz (87.454 kg)  BMI 33.09 kg/m2 Patient is very pleasant and in no acute distress. Skin is warm and dry. Color is normal.  HEENT is unremarkable. Normocephalic/atraumatic. PERRL. Sclera are nonicteric. Neck is supple. No masses. No JVD. Lungs are clear. Cardiac exam shows a regular rate and rhythm. Abdomen is soft.  Extremities are without edema. Gait and ROM are intact. No gross neurologic deficits noted.  LABORATORY DATA: EKG today shows sinus rhythm. She has Q's in lead III, but this was seen on her last tracing as well. Has an incomplete RBBB. No acute changes.   Lab Results  Component Value Date   WBC 10.9* 07/16/2007   HGB 15.1* 07/16/2007   HCT 45.0 07/16/2007   PLT 342 07/16/2007   GLUCOSE 197* 01/17/2012   CHOL 238* 01/17/2012   TRIG 97.0 01/17/2012   HDL 52.10 01/17/2012   LDLDIRECT 168.5 01/17/2012   LDLCALC 114* 09/05/2011   ALT 18 01/17/2012   AST 17 01/17/2012   NA 137 01/17/2012   K 3.8 01/17/2012   CL 102 01/17/2012   CREATININE 0.6 01/17/2012   BUN 18 01/17/2012   CO2 26 01/17/2012   HGBA1C 8.8* 01/17/2012     Assessment / Plan:  1. Chest pain and shortness of breath - she has multiple CV risk factors. Unable to walk on the treadmill. Will arrange for Lexiscan. We are checking labs today as well. She is to use her NTG that she has on hand as needed. If her symptoms persist, she may need cardiac catheterization.   2. DM - she reports her readings are up and down. Last A1C noted.  3. HTN - I have left her on her current regimen for now.   We will tentatively see her back at her regular visit with Dr. [name] unless the stress test would dictate otherwise.   Patient is agreeable to this plan and will call if any problems develop in the interim.

## 2012-03-16 NOTE — Telephone Encounter (Signed)
Spoke with patient and she was c/o left arm pain, increased shortness of breath, and some chest discomfort.  Stated she did do more exercise last week than normal. Yesterday and today she just has not felt good.  Did schedule appointment with Dawayne Patricia NP today

## 2012-03-16 NOTE — Telephone Encounter (Signed)
plz return call to pt [phone], SOB at times, mild pain in left arm, elevated BP, Lethargic

## 2012-03-17 ENCOUNTER — Ambulatory Visit (HOSPITAL_COMMUNITY): Payer: Medicare Other | Attending: Cardiology | Admitting: Radiology

## 2012-03-17 ENCOUNTER — Telehealth: Payer: Self-pay | Admitting: *Deleted

## 2012-03-17 VITALS — BP 139/74 | Ht 64.0 in | Wt 191.0 lb

## 2012-03-17 DIAGNOSIS — R0600 Dyspnea, unspecified: Secondary | ICD-10-CM

## 2012-03-17 DIAGNOSIS — R51 Headache: Secondary | ICD-10-CM

## 2012-03-17 DIAGNOSIS — R0609 Other forms of dyspnea: Secondary | ICD-10-CM | POA: Insufficient documentation

## 2012-03-17 DIAGNOSIS — R079 Chest pain, unspecified: Secondary | ICD-10-CM | POA: Insufficient documentation

## 2012-03-17 DIAGNOSIS — R42 Dizziness and giddiness: Secondary | ICD-10-CM | POA: Insufficient documentation

## 2012-03-17 DIAGNOSIS — Z8249 Family history of ischemic heart disease and other diseases of the circulatory system: Secondary | ICD-10-CM | POA: Insufficient documentation

## 2012-03-17 DIAGNOSIS — R0602 Shortness of breath: Secondary | ICD-10-CM

## 2012-03-17 DIAGNOSIS — E119 Type 2 diabetes mellitus without complications: Secondary | ICD-10-CM | POA: Insufficient documentation

## 2012-03-17 DIAGNOSIS — R55 Syncope and collapse: Secondary | ICD-10-CM | POA: Insufficient documentation

## 2012-03-17 DIAGNOSIS — R0989 Other specified symptoms and signs involving the circulatory and respiratory systems: Secondary | ICD-10-CM | POA: Insufficient documentation

## 2012-03-17 DIAGNOSIS — I10 Essential (primary) hypertension: Secondary | ICD-10-CM | POA: Insufficient documentation

## 2012-03-17 LAB — CBC WITH DIFFERENTIAL/PLATELET
Basophils Absolute: 0.1 10*3/uL (ref 0.0–0.1)
Basophils Relative: 0.6 % (ref 0.0–3.0)
Eosinophils Absolute: 0.2 10*3/uL (ref 0.0–0.7)
Eosinophils Relative: 1.9 % (ref 0.0–5.0)
HCT: 44.7 % (ref 36.0–46.0)
Hemoglobin: 14.6 g/dL (ref 12.0–15.0)
Lymphocytes Relative: 28.8 % (ref 12.0–46.0)
Lymphs Abs: 3.2 10*3/uL (ref 0.7–4.0)
MCHC: 32.8 g/dL (ref 30.0–36.0)
MCV: 91.5 fl (ref 78.0–100.0)
Monocytes Absolute: 0.4 10*3/uL (ref 0.1–1.0)
Monocytes Relative: 3.8 % (ref 3.0–12.0)
Neutro Abs: 7.2 10*3/uL (ref 1.4–7.7)
Neutrophils Relative %: 64.9 % (ref 43.0–77.0)
Platelets: 303 10*3/uL (ref 150.0–400.0)
RBC: 4.88 Mil/uL (ref 3.87–5.11)
RDW: 13.5 % (ref 11.5–14.6)
WBC: 11 10*3/uL — ABNORMAL HIGH (ref 4.5–10.5)

## 2012-03-17 LAB — BASIC METABOLIC PANEL
BUN: 23 mg/dL (ref 6–23)
CO2: 26 mEq/L (ref 19–32)
Calcium: 9 mg/dL (ref 8.4–10.5)
Chloride: 104 mEq/L (ref 96–112)
Creatinine, Ser: 0.9 mg/dL (ref 0.4–1.2)
GFR: 69.95 mL/min (ref >60.00)
Glucose, Bld: 219 mg/dL — ABNORMAL HIGH (ref 70–99)
Potassium: 4.2 mEq/L (ref 3.5–5.1)
Sodium: 136 mEq/L (ref 135–145)

## 2012-03-17 LAB — BRAIN NATRIURETIC PEPTIDE: Pro B Natriuretic peptide (BNP): 277 pg/mL — ABNORMAL HIGH (ref 0.0–100.0)

## 2012-03-17 MED ORDER — TECHNETIUM TC 99M SESTAMIBI GENERIC - CARDIOLITE
33.0000 | Freq: Once | INTRAVENOUS | Status: AC | PRN
  Administered 2012-03-17: 33 via INTRAVENOUS

## 2012-03-17 MED ORDER — REGADENOSON 0.4 MG/5ML IV SOLN
0.4000 mg | Freq: Once | INTRAVENOUS | Status: AC
  Administered 2012-03-17: 0.4 mg via INTRAVENOUS

## 2012-03-17 MED ORDER — TECHNETIUM TC 99M SESTAMIBI GENERIC - CARDIOLITE
11.0000 | Freq: Once | INTRAVENOUS | Status: AC | PRN
  Administered 2012-03-17: 11 via INTRAVENOUS

## 2012-03-17 MED ORDER — AMINOPHYLLINE 25 MG/ML IV SOLN
75.0000 mg | Freq: Once | INTRAVENOUS | Status: AC
  Administered 2012-03-17: 75 mg via INTRAVENOUS

## 2012-03-17 NOTE — Telephone Encounter (Signed)
Message copied by Burnell Blanks on Tue Mar 17, 2012  4:06 PM ------      Message from: Rosalio Macadamia      Created: Tue Mar 17, 2012 11:46 AM       Ok to report. Labs are satisfactory except for glucose.

## 2012-03-17 NOTE — Progress Notes (Signed)
[facility] SITE 3 NUCLEAR MED [id number] [place] [address] [phone]  Cardiology Nuclear Med Study  Whitney Newton is a 71 y.o. female     MRN : [id number]     DOB: [birth year]  Procedure Date: 03/17/2012  Nuclear Med Background Indication for Stress Test:  Evaluation for Ischemia History:  '05 Echo: EF=50-55%, and 12-2009 Myocardial Perfusion Study-Normal, EF=77% Cardiac Risk Factors: CVA, Family History - CAD, Hypertension, IDDM Type 2, Lipids and TIA  Symptoms: Chest Pain with/without exertion that radiates to (L) arm (last occurrence this am),  Diaphoresis, Dizziness, DOE, Fatigue, Fatigue with Exertion, Nausea, Near Syncope and SOB   Nuclear Pre-Procedure Caffeine/Decaff Intake:  None NPO After: 10:30pm   Lungs:  clear O2 Sat: 97-99% on room air. IV 0.9% NS with Angio Cath:  22g  IV Site: R Hand  IV Started by:  Cathlyn Parsons, RN  Chest Size (in):  40 Cup Size: DD  Height: 5\' 4"  (1.626 m)  Weight:  191 lb (86.637 kg)  BMI:  Body mass index is 32.79 kg/(m^2). Tech Comments:  Ziac taken at 0930. The patient had persistent symptoms of nausea, headache, and feeling awful after Lexiscan that resolved quickly after Aminophylline 75 mg IVP given. Irean Hong, RN    Nuclear Med Study 1 or 2 day study: 1 day  Stress Test Type:  Eugenie Birks  Reading MD: Olga Millers, MD  Order Authorizing Provider:  Villa Herb and Sunday Spillers  Resting Radionuclide: Technetium 23m Sestamibi  Resting Radionuclide Dose: 11.0 mCi   Stress Radionuclide:  Technetium 3m Sestamibi  Stress Radionuclide Dose: 33.0 mCi           Stress Protocol Rest HR: 60 Stress HR: 88  Rest BP: 139/74 Stress BP: 150/88  Exercise Time (min): n/a METS: n/a   Predicted Max HR: 149 bpm % Max HR: 59.06 bpm Rate Pressure Product: 62952   Dose of Adenosine (mg):  n/a Dose of Lexiscan: 0.4 mg  Dose of Atropine (mg): n/a Dose of Dobutamine: n/a mcg/kg/min (at  max HR)  Stress Test Technologist: Irean Hong, RN  Nuclear Technologist:  Domenic Polite, CNMT     Rest Procedure:  Myocardial perfusion imaging was performed at rest 45 minutes following the intravenous administration of Technetium 35m Sestamibi. Rest ECG: NSR with RBBB  Stress Procedure:  The patient received IV Lexiscan 0.4 mg over 15-seconds.  Technetium 46m Sestamibi injected at 30-seconds.  There were no significant changes with Lexiscan.  The patient complained of chest pain with Lexiscan. Quantitative spect images were obtained after a 45 minute delay.  Stress ECG: No significant ST segment change suggestive of ischemia.  QPS Raw Data Images:  Acquisition technically good; normal left ventricular size. Stress Images:  Normal homogeneous uptake in all areas of the myocardium. Rest Images:  Normal homogeneous uptake in all areas of the myocardium. Subtraction (SDS):  No evidence of ischemia. Transient Ischemic Dilatation (Normal <1.22):  **1.14* Lung/Heart Ratio (Normal <0.45):  0.32  Quantitative Gated Spect Images QGS EDV:  57 ml QGS ESV:  13 ml  Impression Exercise Capacity:  Lexiscan with no exercise. BP Response:  Normal blood pressure response. Clinical Symptoms:  There is chest pain. ECG Impression:  No significant ST segment change suggestive of ischemia. Comparison with Prior Nuclear Study: No images to compare  Overall Impression:  Normal stress nuclear study.  LV Ejection Fraction: 78%.  LV Wall Motion:  NL LV Function; NL Wall Motion  Brian Crenshaw  

## 2012-03-17 NOTE — Telephone Encounter (Signed)
Message copied by Burnell Blanks on Tue Mar 17, 2012  4:06 PM ------      Message from: Rosalio Macadamia      Created: Tue Mar 17, 2012 11:04 AM       Ok to report. Cardiac enzymes are negative. Other labs pending.

## 2012-03-17 NOTE — Telephone Encounter (Signed)
Advised patient of lab results, patient had just eaten before labs

## 2012-03-18 ENCOUNTER — Telehealth: Payer: Self-pay | Admitting: *Deleted

## 2012-03-18 NOTE — Telephone Encounter (Signed)
Message copied by Burnell Blanks on Wed Mar 18, 2012 12:00 PM ------      Message from: Rosalio Macadamia      Created: Wed Mar 18, 2012  9:44 AM       Ok to report. Stress test is satisfactory. Please ask her to call if her symptoms persist.

## 2012-03-18 NOTE — Telephone Encounter (Signed)
Advised patient

## 2012-03-21 ENCOUNTER — Other Ambulatory Visit: Payer: Self-pay | Admitting: Cardiology

## 2012-04-02 ENCOUNTER — Ambulatory Visit (INDEPENDENT_AMBULATORY_CARE_PROVIDER_SITE_OTHER): Payer: Medicare Other | Admitting: Cardiology

## 2012-04-02 ENCOUNTER — Encounter: Payer: Self-pay | Admitting: Cardiology

## 2012-04-02 VITALS — BP 148/78 | HR 63 | Ht 64.0 in | Wt 190.8 lb

## 2012-04-02 DIAGNOSIS — R0989 Other specified symptoms and signs involving the circulatory and respiratory systems: Secondary | ICD-10-CM

## 2012-04-02 DIAGNOSIS — R0600 Dyspnea, unspecified: Secondary | ICD-10-CM

## 2012-04-02 DIAGNOSIS — E785 Hyperlipidemia, unspecified: Secondary | ICD-10-CM

## 2012-04-02 DIAGNOSIS — R0609 Other forms of dyspnea: Secondary | ICD-10-CM

## 2012-04-02 DIAGNOSIS — I119 Hypertensive heart disease without heart failure: Secondary | ICD-10-CM

## 2012-04-02 MED ORDER — LOSARTAN POTASSIUM 25 MG PO TABS: 25.0000 mg | ORAL_TABLET | Freq: Every day | ORAL | Status: DC

## 2012-04-02 NOTE — Patient Instructions (Addendum)
Your physician has requested that you have an echocardiogram. Echocardiography is a painless test that uses sound waves to create images of your heart. It provides your doctor with information about the size and shape of your heart and how well your heart's chambers and valves are working. This procedure takes approximately one hour. There are no restrictions for this procedure.  Will add Losartan 25 mg daily, Rx sent to [facility]   Your physician recommends that you schedule a follow-up appointment in: in 2 weeks BMET   Keep December appointment

## 2012-04-02 NOTE — Progress Notes (Signed)
Whitney Newton Date of Birth:  [birth year] [facility] [address] [place], [place]  [address] [phone]         Fax   [phone]  History of Present Illness: This pleasant 71 year old woman is seen for a scheduled followup visit. She has a history of essential hypertension, diabetes mellitus, and dyslipidemia. She has had atypical chest pain. She had a normal Cardiolite stress test in July 2011. She is intolerant of statin drugs.  Her last echocardiogram was in 2005.  He updated her lexiscan Myoview stress test on 03/17/12 and it was normal showing no evidence of ischemia and her ejection fraction was 78%.  She has continued to have a lot of shortness of breath.  She's also felt lethargic and had very little energy.  She's had nausea but no vomiting.  She sleeps on one pillow and has to sleep on her side because of orthopedic issues.  Her blood pressure has been running high.  Dyspnea has been worse. Current Outpatient Prescriptions  Medication Sig Dispense Refill  . aspirin 81 MG tablet Take 81 mg by mouth daily.        . colesevelam (WELCHOL) 625 MG tablet Take 1,875 mg by mouth as needed.        . furosemide (LASIX) 40 MG tablet Take 40 mg by mouth 2 (two) times daily. As needed      . gabapentin (NEURONTIN) 100 MG capsule as directed.      . hydrochlorothiazide (HYDRODIURIL) 25 MG tablet TAKE 1 TABLET EACH DAY.  90 tablet  3  . hyoscyamine (LEVBID) 0.375 MG 12 hr tablet Take 0.375 mg by mouth every 12 (twelve) hours as needed.        Marland Kitchen LANTUS 100 UNIT/ML injection INJECT 50 UNITS DAILY OR AS DIRECTED.  10 mL  prn  . levothyroxine (SYNTHROID, LEVOTHROID) 150 MCG tablet Take 150 mcg by mouth daily.        . nitroGLYCERIN (NITROSTAT) 0.4 MG SL tablet DISSOLVE ONE TABLET UNDER TONGUE AS NEEDED FOR ARM/CHEST PAIN.  25 tablet  PRN  . Omeprazole-Sodium Bicarbonate (ZEGERID PO) Take by mouth as needed.        . potassium chloride SA (K-DUR,KLOR-CON) 20 MEQ tablet 1  tablet on Monday, Wednesday, and Friday or as directed  30 tablet  11  . ZIAC 10-6.25 MG per tablet TAKE (1) TABLET DAILY AS DIRECTED.  30 tablet  12  . losartan (COZAAR) 25 MG tablet Take 1 tablet (25 mg total) by mouth daily.  30 tablet  5    Allergies  Allergen Reactions  . Actos (Pioglitazone Hydrochloride)     swelling  . Crestor (Rosuvastatin Calcium)     LEG PAIN  . Fenofibrate   . Lipitor (Atorvastatin Calcium)     CHEST PAIN, SOB  . Metformin And Related   . Niaspan (Niacin Er)     RASH  . Pravachol   . Ranolazine Er     EDEMA  . Zetia (Ezetimibe)     NO SLEEP, SOB    Patient Active Problem List  Diagnosis  . Hypothyroidism  . Benign hypertensive heart disease without heart failure  . Dyslipidemia  . Diabetes mellitus  . Hemangioma of liver  . Postmenopausal state  . Chest pain  . Dizziness    History  Smoking status  . Never Smoker   Smokeless tobacco  . Not on file    History  Alcohol Use No    Family History  Problem Relation Age of Onset  . Heart disease Father   . Heart disease Brother     Review of Systems: Constitutional: no fever chills diaphoresis or fatigue or change in weight.  Head and neck: no hearing loss, no epistaxis, no photophobia or visual disturbance. Respiratory: No cough, shortness of breath or wheezing. Cardiovascular: No chest pain peripheral edema, palpitations. Gastrointestinal: No abdominal distention, no abdominal pain, no change in bowel habits hematochezia or melena. Genitourinary: No dysuria, no frequency, no urgency, no nocturia. Musculoskeletal:No arthralgias, no back pain, no gait disturbance or myalgias. Neurological: No dizziness, no headaches, no numbness, no seizures, no syncope, no weakness, no tremors. Hematologic: No lymphadenopathy, no easy bruising. Psychiatric: No confusion, no hallucinations, no sleep disturbance.    Physical Exam: Filed Vitals:   04/02/12 1108  BP: 148/78  Pulse: 63   the  general appearance reveals a well-developed well-nourished woman in no distress.The head and neck exam reveals pupils equal and reactive.  Extraocular movements are full.  There is no scleral icterus.  The mouth and pharynx are normal.  The neck is supple.  The carotids reveal no bruits.  The jugular venous pressure is normal.  The  thyroid is not enlarged.  There is no lymphadenopathy.  The chest is clear to percussion and auscultation.  There are no rales or rhonchi.  Expansion of the chest is symmetrical.  The precordium is quiet.  The first heart sound is normal.  The second heart sound is physiologically split.  There is no murmur gallop rub or click.  There is no abnormal lift or heave.  The abdomen is soft and nontender.  The bowel sounds are normal.  The liver and spleen are not enlarged.  There are no abdominal masses.  There are no abdominal bruits.  Extremities reveal good pedal pulses.  There is no phlebitis or edema.  There is no cyanosis or clubbing.  Strength is normal and symmetrical in all extremities.  There is no lateralizing weakness.  There are no sensory deficits.  The skin is warm and dry.  There is no rash.     Assessment / Plan: Continue on same medication and add losartan 25 mg daily.  She pointed out to me that she takes only a half tablet of HCTZ daily.  She will return soon for an echocardiogram to evaluate her dyspnea further.  She may have diastolic dysfunction. Return as scheduled in December for office visit and fasting lab work

## 2012-04-02 NOTE — Assessment & Plan Note (Signed)
For her blood pressure we are going to and losartan 25 mg one daily.  She does not recall having been on ARB in the past and her list of allergies does not list it.  We will have her return in 2 weeks for followup basal metabolic panel to be sure that her kidneys are tolerating.

## 2012-04-02 NOTE — Assessment & Plan Note (Signed)
Patient has a history of dyslipidemia.  She is on WelChol.  She is unable to tolerate statins causing myalgias.

## 2012-04-07 ENCOUNTER — Ambulatory Visit (HOSPITAL_COMMUNITY): Payer: Medicare Other | Attending: Cardiology

## 2012-04-07 DIAGNOSIS — I080 Rheumatic disorders of both mitral and aortic valves: Secondary | ICD-10-CM | POA: Insufficient documentation

## 2012-04-07 DIAGNOSIS — I10 Essential (primary) hypertension: Secondary | ICD-10-CM | POA: Insufficient documentation

## 2012-04-07 DIAGNOSIS — I369 Nonrheumatic tricuspid valve disorder, unspecified: Secondary | ICD-10-CM | POA: Insufficient documentation

## 2012-04-07 DIAGNOSIS — R0600 Dyspnea, unspecified: Secondary | ICD-10-CM

## 2012-04-07 DIAGNOSIS — R0989 Other specified symptoms and signs involving the circulatory and respiratory systems: Secondary | ICD-10-CM | POA: Insufficient documentation

## 2012-04-07 DIAGNOSIS — E119 Type 2 diabetes mellitus without complications: Secondary | ICD-10-CM | POA: Insufficient documentation

## 2012-04-07 DIAGNOSIS — I379 Nonrheumatic pulmonary valve disorder, unspecified: Secondary | ICD-10-CM | POA: Insufficient documentation

## 2012-04-07 DIAGNOSIS — R0609 Other forms of dyspnea: Secondary | ICD-10-CM | POA: Insufficient documentation

## 2012-04-07 NOTE — Progress Notes (Signed)
Echocardiogram performed.  

## 2012-04-08 ENCOUNTER — Telehealth: Payer: Self-pay | Admitting: *Deleted

## 2012-04-08 NOTE — Telephone Encounter (Signed)
Message copied by Burnell Blanks on Wed Apr 08, 2012  3:55 PM ------      Message from: Cassell Clement      Created: Tue Apr 07, 2012  9:14 PM       Please report. Echo is okay.  Good LV systolic function. Mild diastolic dysfunction..  Aortic valve shows only minimal regurgitation.

## 2012-04-08 NOTE — Telephone Encounter (Signed)
Left message to call back  

## 2012-04-09 ENCOUNTER — Ambulatory Visit (INDEPENDENT_AMBULATORY_CARE_PROVIDER_SITE_OTHER): Payer: Medicare Other | Admitting: Cardiology

## 2012-04-09 VITALS — BP 140/72 | HR 62 | Wt 191.8 lb

## 2012-04-09 DIAGNOSIS — K219 Gastro-esophageal reflux disease without esophagitis: Secondary | ICD-10-CM

## 2012-04-09 DIAGNOSIS — R079 Chest pain, unspecified: Secondary | ICD-10-CM

## 2012-04-09 DIAGNOSIS — I119 Hypertensive heart disease without heart failure: Secondary | ICD-10-CM

## 2012-04-09 MED ORDER — ISOSORBIDE MONONITRATE ER 30 MG PO TB24: 30.0000 mg | ORAL_TABLET | Freq: Every day | ORAL | Status: DC

## 2012-04-09 NOTE — Progress Notes (Signed)
Whitney Newton Date of Birth:  09/15/1940 [facility] [address] [place], [place]  [address] [phone]         Fax   [phone]  History of Present Illness: This pleasant 71 year old woman is seen for a scheduled followup visit. She has a history of essential hypertension, diabetes mellitus, and dyslipidemia. She has had atypical chest pain. She had a normal Cardiolite stress test in July 2011. She is intolerant of statin drugs. Her last echocardiogram was in 2005. He updated her lexiscan Myoview stress test on 03/17/12 and it was normal showing no evidence of ischemia and her ejection fraction was 78%.  The patient has never had a cardiac catheterization.   She has continued to have a lot of shortness of breath.  She had an echocardiogram in 04/07/12 which showed an ejection fraction of 55-65% with grade 1 diastolic dysfunction.  With trivial aortic insufficiency. The patient returns to the office today as an urgent work in because of chest discomfort last evening.   Current Outpatient Prescriptions  Medication Sig Dispense Refill  . aspirin 81 MG tablet Take 81 mg by mouth daily.        . colesevelam (WELCHOL) 625 MG tablet Take 1,875 mg by mouth as needed.        . furosemide (LASIX) 40 MG tablet Take 40 mg by mouth 2 (two) times daily. As needed      . gabapentin (NEURONTIN) 100 MG capsule as directed.      . hydrochlorothiazide (HYDRODIURIL) 25 MG tablet TAKE 1 TABLET EACH DAY.  90 tablet  3  . hyoscyamine (LEVBID) 0.375 MG 12 hr tablet Take 0.375 mg by mouth every 12 (twelve) hours as needed.        Marland Kitchen LANTUS 100 UNIT/ML injection INJECT 50 UNITS DAILY OR AS DIRECTED.  10 mL  prn  . levothyroxine (SYNTHROID, LEVOTHROID) 150 MCG tablet Take 150 mcg by mouth daily.        Marland Kitchen losartan (COZAAR) 25 MG tablet Take 1 tablet (25 mg total) by mouth daily.  30 tablet  5  . nitroGLYCERIN (NITROSTAT) 0.4 MG SL tablet DISSOLVE ONE TABLET UNDER TONGUE AS NEEDED FOR  ARM/CHEST PAIN.  25 tablet  PRN  . Omeprazole-Sodium Bicarbonate (ZEGERID PO) Take by mouth as needed.        . potassium chloride SA (K-DUR,KLOR-CON) 20 MEQ tablet 1 tablet on Monday, Wednesday, and Friday or as directed  30 tablet  11  . ZIAC 10-6.25 MG per tablet TAKE (1) TABLET DAILY AS DIRECTED.  30 tablet  12  . isosorbide mononitrate (IMDUR) 30 MG 24 hr tablet Take 1 tablet (30 mg total) by mouth daily.  30 tablet  5    Allergies  Allergen Reactions  . Actos (Pioglitazone Hydrochloride)     swelling  . Crestor (Rosuvastatin Calcium)     LEG PAIN  . Fenofibrate   . Lipitor (Atorvastatin Calcium)     CHEST PAIN, SOB  . Metformin And Related   . Niaspan (Niacin Er)     RASH  . Pravachol   . Ranolazine Er     EDEMA  . Zetia (Ezetimibe)     NO SLEEP, SOB    Patient Active Problem List  Diagnosis  . Hypothyroidism  . Benign hypertensive heart disease without heart failure  . Dyslipidemia  . Diabetes mellitus  . Hemangioma of liver  . Postmenopausal state  . Chest pain  . Dizziness    History  Smoking status  . Never Smoker   Smokeless tobacco  . Not on file    History  Alcohol Use No    Family History  Problem Relation Age of Onset  . Heart disease Father   . Heart disease Brother     Review of Systems: Constitutional: no fever chills diaphoresis or fatigue or change in weight.  Head and neck: no hearing loss, no epistaxis, no photophobia or visual disturbance. Respiratory: No cough, shortness of breath or wheezing. Cardiovascular: No chest pain peripheral edema, palpitations. Gastrointestinal: No abdominal distention, no abdominal pain, no change in bowel habits hematochezia or melena. Genitourinary: No dysuria, no frequency, no urgency, no nocturia. Musculoskeletal:No arthralgias, no back pain, no gait disturbance or myalgias. Neurological: No dizziness, no headaches, no numbness, no seizures, no syncope, no weakness, no tremors. Hematologic: No  lymphadenopathy, no easy bruising. Psychiatric: No confusion, no hallucinations, no sleep disturbance.    Physical Exam: Filed Vitals:   04/09/12 1022  BP: 140/72  Pulse: 62   general appearance reveals a well-developed woman in no acute distress.The head and neck exam reveals pupils equal and reactive.  Extraocular movements are full.  There is no scleral icterus.  The mouth and pharynx are normal.  The neck is supple.  The carotids reveal no bruits.  The jugular venous pressure is normal.  The  thyroid is not enlarged.  There is no lymphadenopathy.  The chest is clear to percussion and auscultation.  There are no rales or rhonchi.  Expansion of the chest is symmetrical.  The precordium is quiet.  The first heart sound is normal.  The second heart sound is physiologically split.  There is no murmur gallop rub or click.  There is no abnormal lift or heave.  The abdomen is soft and nontender.  The bowel sounds are normal.  The liver and spleen are not enlarged.  There are no abdominal masses.  There are no abdominal bruits.  Extremities reveal good pedal pulses.  There is no phlebitis or edema.  There is no cyanosis or clubbing.  Strength is normal and symmetrical in all extremities.  There is no lateralizing weakness.  There are no sensory deficits.  The skin is warm and dry.  There is no rash.  EKG today shows normal sinus rhythm and incomplete right bundle branch block and no acute changes   Assessment / Plan: Chest pain which may be related to spontaneous coronary spasm.  The patient did not demonstrate any ischemia on her recent nuclear stress test.  We discussed possible cardiac catheterization for further evaluation but she would prefer to avoid cardiac catheterization unless absolutely necessary.  We will add generic isosorbide mononitrate 30 mg each day and see if this will help prevent further episodes of chest pain.  Recheck at her regular visit or sooner when necessary.  If chest pains  persist we will talk again about cardiac catheterization.

## 2012-04-09 NOTE — Telephone Encounter (Signed)
New Problem:    Patient called in because she had chest pain off and on last night, more SOB than usual, some nausea, and some left hand numbness.  Chest pain was the chief complaint, believes it is due to a new medication she has been placed on.  Please call back.

## 2012-04-09 NOTE — Assessment & Plan Note (Signed)
The patient developed chest discomfort last evening at 8 PM while watching television.  It began on her right side and moved into her left chest.  There was slight left arm radiation and slight left hand numbness.  She took a single sublingual nitroglycerin with relief.  Also of note is the fact that she had nocturia about 5 times last evening with large volumes of urine and this is unusual for her.  She thinks that she may have been overloaded with salt and fluid

## 2012-04-09 NOTE — Assessment & Plan Note (Signed)
The patient has not been having any hypoglycemic episodes 

## 2012-04-09 NOTE — Patient Instructions (Addendum)
Add Imdur 30 mg daily, Rx sent to [facility]  Call back if or go to the emergency room if your symptoms do not improve or get worse  Keep your December appointment

## 2012-04-09 NOTE — Telephone Encounter (Signed)
Had chest pains sever last night started in right side of chest and moved to left side.  Most severe than ever had before.  Has been going on off and on all night.  Has caused her to have shortness of breath and Has taken Cozaar before d/c secondary to some kind of reaction, does not remember what it was.  Blood pressure only down to 140's.   Discussed with  Dr. [name] and patient will come for office visit and  Dr. [name] will go over echo

## 2012-04-09 NOTE — Assessment & Plan Note (Signed)
Blood pressure is improved and she is now on low-dose losartan 25 mg daily.  She is scheduled to return in another week or 2 for followup basal metabolic panel following institution of losartan

## 2012-04-16 ENCOUNTER — Other Ambulatory Visit (INDEPENDENT_AMBULATORY_CARE_PROVIDER_SITE_OTHER): Payer: Medicare Other

## 2012-04-16 ENCOUNTER — Telehealth: Payer: Self-pay | Admitting: *Deleted

## 2012-04-16 DIAGNOSIS — I119 Hypertensive heart disease without heart failure: Secondary | ICD-10-CM

## 2012-04-16 LAB — BASIC METABOLIC PANEL
BUN: 18 mg/dL (ref 6–23)
Chloride: 103 mEq/L (ref 96–112)
Creatinine, Ser: 0.7 mg/dL (ref 0.4–1.2)
GFR: 93.64 mL/min (ref >60.00)

## 2012-04-16 NOTE — Telephone Encounter (Signed)
Mailed copy of labs and left message to call if any questions  

## 2012-04-16 NOTE — Telephone Encounter (Signed)
Message copied by Burnell Blanks on Thu Apr 16, 2012  4:07 PM ------      Message from: Whitney Newton      Created: Thu Apr 16, 2012  3:28 PM       Please report to patient.  The recent labs are stable. Continue same medication and careful diet.

## 2012-04-16 NOTE — Progress Notes (Signed)
Quick Note:  Please report to patient. The recent labs are stable. Continue same medication and careful diet. ______ 

## 2012-05-18 ENCOUNTER — Other Ambulatory Visit (INDEPENDENT_AMBULATORY_CARE_PROVIDER_SITE_OTHER): Payer: Medicare Other

## 2012-05-18 DIAGNOSIS — R0989 Other specified symptoms and signs involving the circulatory and respiratory systems: Secondary | ICD-10-CM

## 2012-05-18 DIAGNOSIS — R0609 Other forms of dyspnea: Secondary | ICD-10-CM

## 2012-05-18 DIAGNOSIS — I119 Hypertensive heart disease without heart failure: Secondary | ICD-10-CM

## 2012-05-18 DIAGNOSIS — IMO0001 Reserved for inherently not codable concepts without codable children: Secondary | ICD-10-CM

## 2012-05-18 DIAGNOSIS — E119 Type 2 diabetes mellitus without complications: Secondary | ICD-10-CM

## 2012-05-18 LAB — CBC WITH DIFFERENTIAL/PLATELET
Basophils Absolute: 0 10*3/uL (ref 0.0–0.1)
Eosinophils Absolute: 0.3 10*3/uL (ref 0.0–0.7)
Eosinophils Relative: 3 % (ref 0.0–5.0)
MCHC: 33.4 g/dL (ref 30.0–36.0)
MCV: 89.9 fl (ref 78.0–100.0)
Monocytes Absolute: 0.5 10*3/uL (ref 0.1–1.0)
Neutrophils Relative %: 61.4 % (ref 43.0–77.0)
Platelets: 295 10*3/uL (ref 150.0–400.0)
RDW: 13.2 % (ref 11.5–14.6)
WBC: 9.3 10*3/uL (ref 4.5–10.5)

## 2012-05-18 LAB — BASIC METABOLIC PANEL
BUN: 15 mg/dL (ref 6–23)
CO2: 25 mEq/L (ref 19–32)
Calcium: 9.5 mg/dL (ref 8.4–10.5)
Creatinine, Ser: 0.7 mg/dL (ref 0.4–1.2)

## 2012-05-18 LAB — LIPID PANEL
Cholesterol: 220 mg/dL — ABNORMAL HIGH (ref 0–200)
Total CHOL/HDL Ratio: 4

## 2012-05-18 LAB — HEPATIC FUNCTION PANEL
AST: 17 U/L (ref 0–37)
Albumin: 3.7 g/dL (ref 3.5–5.2)
Alkaline Phosphatase: 70 U/L (ref 39–117)

## 2012-05-18 LAB — LDL CHOLESTEROL, DIRECT: Direct LDL: 160.3 mg/dL

## 2012-05-18 NOTE — Progress Notes (Signed)
Quick Note:  Please make copy of labs for patient visit. ______ 

## 2012-05-20 ENCOUNTER — Ambulatory Visit (INDEPENDENT_AMBULATORY_CARE_PROVIDER_SITE_OTHER): Payer: Medicare Other | Admitting: Cardiology

## 2012-05-20 ENCOUNTER — Encounter: Payer: Self-pay | Admitting: Cardiology

## 2012-05-20 VITALS — BP 136/80 | HR 75 | Resp 18 | Ht 64.0 in | Wt 190.0 lb

## 2012-05-20 DIAGNOSIS — I119 Hypertensive heart disease without heart failure: Secondary | ICD-10-CM

## 2012-05-20 DIAGNOSIS — E039 Hypothyroidism, unspecified: Secondary | ICD-10-CM

## 2012-05-20 DIAGNOSIS — E119 Type 2 diabetes mellitus without complications: Secondary | ICD-10-CM

## 2012-05-20 NOTE — Assessment & Plan Note (Signed)
The patient is clinically euthyroid.  She will continue current Synthroid dose

## 2012-05-20 NOTE — Progress Notes (Signed)
Whitney Newton Date of Birth:  [birth year] [facility] [address] [place], [place]  [address] [phone]         Fax   [phone]  History of Present Illness: This pleasant 71 year old woman is seen for a scheduled followup visit. She has a history of essential hypertension, diabetes mellitus, and dyslipidemia. She has had atypical chest pain. She is intolerant of statin drugs.  We updated her lexiscan Myoview stress test on 03/17/12 and it was normal showing no evidence of ischemia and her ejection fraction was 78%. The patient has never had a cardiac catheterization. She has continued to have a lot of shortness of breath. She had an echocardiogram in 04/07/12 which showed an ejection fraction of 55-65% with grade 1 diastolic dysfunction.  There is trivial aortic insufficiency.  The patient has a history of essential hypertension as well as atypical chest pain.  At her last visit we started her on losartan as well as isosorbide mononitrate.  She had what she thought were side effects from those drugs and she stopped both of them.  She was having headaches which were probably from the isosorbide mononitrate.  She moves the losartan for her kidney function and we will restart that now.  She has a significant diabetic and needs olmesartan for protection because of her kidneys and her diabetes.  Current Outpatient Prescriptions  Medication Sig Dispense Refill  . aspirin 81 MG tablet Take 81 mg by mouth daily.        Marland Kitchen azithromycin (ZITHROMAX) 250 MG tablet Take 250 mg by mouth daily.      . colesevelam (WELCHOL) 625 MG tablet Take 1,875 mg by mouth as needed.        . furosemide (LASIX) 40 MG tablet Take 40 mg by mouth 2 (two) times daily. As needed      . gabapentin (NEURONTIN) 100 MG capsule as directed.      . hydrochlorothiazide (HYDRODIURIL) 25 MG tablet Take 25 mg by mouth as directed. 1/4 tablet daily      . hyoscyamine (LEVBID) 0.375 MG 12 hr tablet Take 0.375 mg  by mouth every 12 (twelve) hours as needed.        . insulin glargine (LANTUS) 100 UNIT/ML injection Inject 54 Units into the skin daily.       Marland Kitchen levothyroxine (SYNTHROID, LEVOTHROID) 150 MCG tablet Take 150 mcg by mouth daily.        Marland Kitchen losartan (COZAAR) 25 MG tablet Take 1 tablet (25 mg total) by mouth daily.  30 tablet  5  . nitroGLYCERIN (NITROSTAT) 0.4 MG SL tablet DISSOLVE ONE TABLET UNDER TONGUE AS NEEDED FOR ARM/CHEST PAIN.  25 tablet  PRN  . Omeprazole-Sodium Bicarbonate (ZEGERID PO) Take by mouth as needed.        . potassium chloride SA (K-DUR,KLOR-CON) 20 MEQ tablet 1 tablet on Monday, Wednesday, and Friday or as directed  30 tablet  11  . ZIAC 10-6.25 MG per tablet TAKE (1) TABLET DAILY AS DIRECTED.  30 tablet  12  . [DISCONTINUED] hydrochlorothiazide (HYDRODIURIL) 25 MG tablet TAKE 1 TABLET EACH DAY.  90 tablet  3  . [DISCONTINUED] LANTUS 100 UNIT/ML injection INJECT 50 UNITS DAILY OR AS DIRECTED.  10 mL  prn    Allergies  Allergen Reactions  . Actos (Pioglitazone Hydrochloride)     swelling  . Crestor (Rosuvastatin Calcium)     LEG PAIN  . Fenofibrate   . Imdur (Isosorbide)  headache  . Lipitor (Atorvastatin Calcium)     CHEST PAIN, SOB  . Metformin And Related   . Niaspan (Niacin Er)     RASH  . Pravachol   . Ranolazine Er     EDEMA  . Zetia (Ezetimibe)     NO SLEEP, SOB    Patient Active Problem List  Diagnosis  . Hypothyroidism  . Benign hypertensive heart disease without heart failure  . Dyslipidemia  . Diabetes mellitus  . Hemangioma of liver  . Postmenopausal state  . Chest pain  . Dizziness    History  Smoking status  . Never Smoker   Smokeless tobacco  . Not on file    History  Alcohol Use No    Family History  Problem Relation Age of Onset  . Heart disease Father   . Heart disease Brother     Review of Systems: Constitutional: no fever chills diaphoresis or fatigue or change in weight.  Head and neck: no hearing loss, no  epistaxis, no photophobia or visual disturbance. Respiratory: No cough, shortness of breath or wheezing. Cardiovascular: No chest pain peripheral edema, palpitations. Gastrointestinal: No abdominal distention, no abdominal pain, no change in bowel habits hematochezia or melena. Genitourinary: No dysuria, no frequency, no urgency, no nocturia. Musculoskeletal:No arthralgias, no back pain, no gait disturbance or myalgias. Neurological: No dizziness, no headaches, no numbness, no seizures, no syncope, no weakness, no tremors. Hematologic: No lymphadenopathy, no easy bruising. Psychiatric: No confusion, no hallucinations, no sleep disturbance.    Physical Exam: Filed Vitals:   05/20/12 0945  BP: 136/80  Pulse: 75  Resp: 18   the general appearance reveals a well-developed well-nourished woman in no distress.The head and neck exam reveals pupils equal and reactive.  Extraocular movements are full.  There is no scleral icterus.  The mouth and pharynx are normal.  The neck is supple.  The carotids reveal no bruits.  The jugular venous pressure is normal.  The  thyroid is not enlarged.  There is no lymphadenopathy.  The chest is clear to percussion and auscultation.  There are no rales or rhonchi.  Expansion of the chest is symmetrical.  The precordium is quiet.  The first heart sound is normal.  The second heart sound is physiologically split.  There is no murmur gallop rub or click.  There is no abnormal lift or heave.  The abdomen is soft and nontender.  The bowel sounds are normal.  The liver and spleen are not enlarged.  There are no abdominal masses.  There are no abdominal bruits.  Extremities reveal good pedal pulses.  There is no phlebitis or edema.  There is no cyanosis or clubbing.  Strength is normal and symmetrical in all extremities.  There is no lateralizing weakness.  There are no sensory deficits.  The skin is warm and dry.  There is no rash.     Assessment / Plan: Continue same  medication except restart losartan 25 mg daily, decrease hydrochlorothiazide down to 6.25 mg daily, and increased Lantus up to 54 units daily. She also needs to be sure that she eats her meals and a regular time each day and she needs to try to get more sleep at night. Recheck in 4 months for followup office visit lipid panel hepatic function panel basal metabolic panel and A1c

## 2012-05-20 NOTE — Assessment & Plan Note (Signed)
The patient has significant diabetes.  Her A1c is 9.0.  She is presently taking hydrochlorothiazide 12.5 mg daily.  This may be contributing to her diabetes and we will have her reduce the dose to just a fourth of a 25 mg tablet daily.  Also want her to increase her Lantus insulin up from 50 units to 54 units.

## 2012-05-20 NOTE — Patient Instructions (Signed)
DECREASE YOUR HYDROCHLORITHIAZIDE TO 1/4 TABLET DAILY   RESTART YOUR LOSARTAN 25 MG DAILY  INCREASE YOUR LANTUS TO 54 UNITS DAILY  Your physician wants you to follow-up in: 4 months with fasting labs (lp/bmet/hfp/a1c) You will receive a reminder letter in the mail two months in advance. If you don't receive a letter, please call our office to schedule the follow-up appointment.

## 2012-05-20 NOTE — Assessment & Plan Note (Signed)
The patient's blood pressure today is mildly elevated.  We will have her restart her losartan 25 mg one daily.

## 2012-05-25 DIAGNOSIS — R4701 Aphasia: Secondary | ICD-10-CM | POA: Insufficient documentation

## 2012-06-11 ENCOUNTER — Telehealth: Payer: Self-pay | Admitting: Cardiology

## 2012-06-15 ENCOUNTER — Telehealth: Payer: Self-pay | Admitting: Cardiology

## 2012-06-15 ENCOUNTER — Other Ambulatory Visit: Payer: Self-pay | Admitting: *Deleted

## 2012-06-15 DIAGNOSIS — E119 Type 2 diabetes mellitus without complications: Secondary | ICD-10-CM

## 2012-06-15 MED ORDER — INSULIN GLARGINE 100 UNIT/ML ~~LOC~~ SOLN: 54.0000 [IU] | Freq: Every day | SUBCUTANEOUS | Status: DC

## 2012-06-15 NOTE — Telephone Encounter (Signed)
Walk in pt Form " Pt Needs RX,Needles For [employer] New Machine For  Testing Sent to [facility] 06/15/12/KM

## 2012-06-16 ENCOUNTER — Telehealth: Payer: Self-pay | Admitting: *Deleted

## 2012-06-16 NOTE — Telephone Encounter (Signed)
Mark (PHARMACIST) at [facility] [address] [place] [address]), [place] - [address], called and stated Elon Alas. Winfree is a new pt to [facility], he is requesting Juliette Alcide to either Fax, or E-Scribe all new Rx's needing to be filled into their pharmacy.  Loraine Leriche also stated due to Medicare part B, it must include diagnoses codes as well as clear instructions.  Caralee Ates, CMA

## 2012-06-17 ENCOUNTER — Telehealth: Payer: Self-pay | Admitting: Cardiology

## 2012-06-17 NOTE — Telephone Encounter (Signed)
New Problem:    Patient would like you to give her a call back about her medications.  Please call back.

## 2012-06-17 NOTE — Telephone Encounter (Signed)
Left message for patient to call back, unsure of what kind of meter and strips

## 2012-06-17 NOTE — Telephone Encounter (Signed)
Left message to call back  

## 2012-06-18 NOTE — Telephone Encounter (Signed)
Pt rtn call 

## 2012-06-18 NOTE — Telephone Encounter (Signed)
Will fax to [employer] as requested

## 2012-06-18 NOTE — Telephone Encounter (Signed)
Spoke with patient and she stated she did not want to use [facility] and requested info be sent to [facility]

## 2012-06-19 ENCOUNTER — Telehealth: Payer: Self-pay | Admitting: Cardiology

## 2012-06-19 NOTE — Telephone Encounter (Signed)
Pt talked with Whitney Newton yesterday re diabetic testing supplies to be called [facility] to [facility], as of this call bridget at [facility] says nothing has been called in

## 2012-06-19 NOTE — Telephone Encounter (Signed)
[facility]axed to [facility], number in system wrong

## 2012-06-23 ENCOUNTER — Telehealth: Payer: Self-pay | Admitting: Cardiology

## 2012-06-23 NOTE — Telephone Encounter (Signed)
New problem:    Can she come by the office to pick up Rx for DM  supply.

## 2012-06-23 NOTE — Telephone Encounter (Signed)
Spoke with pharmacy and patient picked up Rx

## 2012-06-23 NOTE — Telephone Encounter (Signed)
Follow up:    Called in needing a a re-fax of the prescriptions for the patient's test strips, Lantus and syringes for a specific amount of time (i.e. 30 or 60 days) due to the patient's medicare insurance.  Please fax to [phone]

## 2012-06-23 NOTE — Telephone Encounter (Signed)
Advised will be up front, did have confirmation that fax went to pharmacy

## 2012-06-29 ENCOUNTER — Other Ambulatory Visit: Payer: Self-pay

## 2012-06-29 DIAGNOSIS — E876 Hypokalemia: Secondary | ICD-10-CM

## 2012-06-29 MED ORDER — POTASSIUM CHLORIDE CRYS ER 20 MEQ PO TBCR: EXTENDED_RELEASE_TABLET | ORAL | Status: DC

## 2012-07-01 ENCOUNTER — Other Ambulatory Visit: Payer: Self-pay

## 2012-07-01 DIAGNOSIS — E876 Hypokalemia: Secondary | ICD-10-CM

## 2012-07-01 MED ORDER — POTASSIUM CHLORIDE CRYS ER 20 MEQ PO TBCR: EXTENDED_RELEASE_TABLET | ORAL | Status: DC

## 2012-07-06 ENCOUNTER — Other Ambulatory Visit: Payer: Self-pay

## 2012-07-06 DIAGNOSIS — E876 Hypokalemia: Secondary | ICD-10-CM

## 2012-07-06 MED ORDER — POTASSIUM CHLORIDE CRYS ER 20 MEQ PO TBCR: EXTENDED_RELEASE_TABLET | ORAL | Status: DC

## 2012-07-07 ENCOUNTER — Other Ambulatory Visit: Payer: Self-pay

## 2012-07-07 DIAGNOSIS — E876 Hypokalemia: Secondary | ICD-10-CM

## 2012-07-07 MED ORDER — POTASSIUM CHLORIDE CRYS ER 20 MEQ PO TBCR: EXTENDED_RELEASE_TABLET | ORAL | Status: DC

## 2012-07-09 ENCOUNTER — Other Ambulatory Visit: Payer: Self-pay

## 2012-07-09 DIAGNOSIS — E876 Hypokalemia: Secondary | ICD-10-CM

## 2012-07-09 MED ORDER — POTASSIUM CHLORIDE CRYS ER 20 MEQ PO TBCR: EXTENDED_RELEASE_TABLET | ORAL | Status: DC

## 2012-07-15 ENCOUNTER — Other Ambulatory Visit: Payer: Self-pay | Admitting: *Deleted

## 2012-07-31 ENCOUNTER — Encounter: Payer: Self-pay | Admitting: Cardiology

## 2012-07-31 ENCOUNTER — Ambulatory Visit (INDEPENDENT_AMBULATORY_CARE_PROVIDER_SITE_OTHER): Payer: Medicare Other | Admitting: Cardiology

## 2012-07-31 VITALS — BP 150/78 | HR 80 | Ht 65.0 in | Wt 195.0 lb

## 2012-07-31 DIAGNOSIS — I6350 Cerebral infarction due to unspecified occlusion or stenosis of unspecified cerebral artery: Secondary | ICD-10-CM

## 2012-07-31 DIAGNOSIS — I119 Hypertensive heart disease without heart failure: Secondary | ICD-10-CM

## 2012-07-31 DIAGNOSIS — I639 Cerebral infarction, unspecified: Secondary | ICD-10-CM

## 2012-07-31 MED ORDER — FUROSEMIDE 40 MG PO TABS: 40.0000 mg | ORAL_TABLET | Freq: Every day | ORAL | Status: DC | PRN

## 2012-07-31 NOTE — Patient Instructions (Addendum)
Your physician recommends that you continue on your current medications as directed. Please refer to the Current Medication list given to you today.  Keep your regularly scheduled appointment

## 2012-07-31 NOTE — Progress Notes (Signed)
Whitney Newton Date of Birth:  28-Oct-1940 [facility] [address] [place], [place]  [address] [phone]         Fax   [phone]  History of Present Illness: This pleasant 72 year old woman is seen for a work in followup visit. She has a history of essential hypertension, diabetes mellitus, and dyslipidemia. She has had atypical chest pain. She is intolerant of statin drugs. We updated her lexiscan Myoview stress test on 03/17/12 and it was normal showing no evidence of ischemia and her ejection fraction was 78%. The patient has never had a cardiac catheterization. She has continued to have a lot of shortness of breath. She had an echocardiogram in 04/07/12 which showed an ejection fraction of 55-65% with grade 1 diastolic dysfunction. There is trivial aortic insufficiency.  The patient has a history of essential hypertension as well as atypical chest pain. At her last visit we started her on losartan as well as isosorbide mononitrate. She had what she thought were side effects from those drugs and she stopped both of them. She was having headaches which were probably from the isosorbide mononitrate. She needs the losartan for her kidney function and we will restart that now. She has a significant diabetic and needs olmesartan for protection because of her kidneys and her diabetes.  Since last visit she has been back over to [facility] to see her neurologist who evaluated her and felt that she indeed had had a stroke.  He has placed her on Plavix.  Her stroke was apparently manifested by intermittent problems with speech.  She states that since starting the Plavix she has had no further symptoms in this regard.   Current Outpatient Prescriptions  Medication Sig Dispense Refill  . clopidogrel (PLAVIX) 75 MG tablet Take 75 mg by mouth daily.      . colesevelam (WELCHOL) 625 MG tablet Take 1,875 mg by mouth as needed.        . gabapentin (NEURONTIN) 100 MG capsule as  directed.      . hydrochlorothiazide (HYDRODIURIL) 25 MG tablet Take 25 mg by mouth as directed. 1/4 tablet daily      . hyoscyamine (LEVBID) 0.375 MG 12 hr tablet Take 0.375 mg by mouth every 12 (twelve) hours as needed.        . insulin glargine (LANTUS) 100 UNIT/ML injection Inject 54 Units into the skin daily.  10 mL  prn  . levothyroxine (SYNTHROID, LEVOTHROID) 150 MCG tablet Take 150 mcg by mouth daily.        Marland Kitchen losartan (COZAAR) 25 MG tablet Take 1 tablet (25 mg total) by mouth daily.  30 tablet  5  . nitroGLYCERIN (NITROSTAT) 0.4 MG SL tablet DISSOLVE ONE TABLET UNDER TONGUE AS NEEDED FOR ARM/CHEST PAIN.  25 tablet  PRN  . Omeprazole-Sodium Bicarbonate (ZEGERID PO) Take by mouth as needed.        . potassium chloride SA (K-DUR,KLOR-CON) 20 MEQ tablet 1 tablet on Monday, Wednesday, and Friday or as directed  36 tablet  11  . ZIAC 10-6.25 MG per tablet TAKE (1) TABLET DAILY AS DIRECTED.  30 tablet  12  . furosemide (LASIX) 40 MG tablet Take 1 tablet (40 mg total) by mouth daily as needed.  30 tablet  11   No current facility-administered medications for this visit.    Allergies  Allergen Reactions  . Actos (Pioglitazone Hydrochloride)     swelling  . Crestor (Rosuvastatin Calcium)     LEG  PAIN  . Fenofibrate   . Imdur (Isosorbide)     headache  . Lipitor (Atorvastatin Calcium)     CHEST PAIN, SOB  . Metformin And Related   . Niaspan (Niacin Er)     RASH  . Pravachol   . Ranolazine Er     EDEMA  . Zetia (Ezetimibe)     NO SLEEP, SOB    Patient Active Problem List  Diagnosis  . Hypothyroidism  . Benign hypertensive heart disease without heart failure  . Dyslipidemia  . Diabetes mellitus  . Hemangioma of liver  . Postmenopausal state  . Chest pain  . Dizziness  . CVA (cerebral vascular accident)    History  Smoking status  . Never Smoker   Smokeless tobacco  . Not on file    History  Alcohol Use No    Family History  Problem Relation Age of Onset  .  Heart disease Father   . Heart disease Brother     Review of Systems: Constitutional: no fever chills diaphoresis or fatigue or change in weight.  Head and neck: no hearing loss, no epistaxis, no photophobia or visual disturbance. Respiratory: No cough, shortness of breath or wheezing. Cardiovascular: No chest pain peripheral edema, palpitations. Gastrointestinal: No abdominal distention, no abdominal pain, no change in bowel habits hematochezia or melena. Genitourinary: No dysuria, no frequency, no urgency, no nocturia. Musculoskeletal:No arthralgias, no back pain, no gait disturbance or myalgias. Neurological: No dizziness, no headaches, no numbness, no seizures, no syncope, no weakness, no tremors. Hematologic: No lymphadenopathy, no easy bruising. Psychiatric: No confusion, no hallucinations, no sleep disturbance.    Physical Exam: Filed Vitals:   07/31/12 1544  BP: 150/78  Pulse: 80   the general appearance reveals a well-developed well-nourished woman in no distress.The head and neck exam reveals pupils equal and reactive.  Extraocular movements are full.  There is no scleral icterus.  The mouth and pharynx are normal.  The neck is supple.  The carotids reveal no bruits.  The jugular venous pressure is normal.  The  thyroid is not enlarged.  There is no lymphadenopathy.  The chest is clear to percussion and auscultation.  There are no rales or rhonchi.  Expansion of the chest is symmetrical.  The precordium is quiet.  The first heart sound is normal.  The second heart sound is physiologically split.  There is no murmur gallop rub or click.  There is no abnormal lift or heave.  The abdomen is soft and nontender.  The bowel sounds are normal.  The liver and spleen are not enlarged.  There are no abdominal masses.  There are no abdominal bruits.  Extremities reveal good pedal pulses.  There is no phlebitis or edema.  There is no cyanosis or clubbing.  Strength is normal and symmetrical in  all extremities.  There is no lateralizing weakness.  There are no sensory deficits.  The skin is warm and dry.  There is no rash.     Assessment / Plan: Continue same medication.  Add Lasix 40 mg one daily when necessary for excessive retention of fluid. Future diabetes followup with the [facility] in [place]. The patient will be rechecked here at her regular appointment in April for office visit and lab work

## 2012-07-31 NOTE — Assessment & Plan Note (Signed)
Patient had a severe episode of chest pain earlier last week and was on the verge of calling 911 but decided against it because of the snow.  Her pain did not respond to sublingual nitroglycerin but did respond to antacid

## 2012-07-31 NOTE — Assessment & Plan Note (Signed)
Presently the patient has not had any orthopnea or paroxysmal nocturnal dyspnea.  She recently developed some problems with fluid retention and took Lasix for several consecutive days and felt much better afterward.  She is requesting a prescription for Lasix to have on hand for when necessary use which we gave to her today.  She also is on hydrochlorothiazide chronically for blood pressure control

## 2012-07-31 NOTE — Assessment & Plan Note (Signed)
The patient has an appointment with a new diabetologist at [facility] at [facility].

## 2012-07-31 NOTE — Assessment & Plan Note (Signed)
The patient has a diagnosis of the previous stroke diagnosed by her neurologist Dr. Conrad  at [facility].  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  She had carotid Dopplers in 05/28/12 which did not show any significant carotid artery lesions.  She has had no further speech problems since starting Plavix.

## 2012-09-09 DIAGNOSIS — Z85828 Personal history of other malignant neoplasm of skin: Secondary | ICD-10-CM | POA: Insufficient documentation

## 2012-09-09 DIAGNOSIS — D179 Benign lipomatous neoplasm, unspecified: Secondary | ICD-10-CM | POA: Insufficient documentation

## 2012-09-14 ENCOUNTER — Other Ambulatory Visit (INDEPENDENT_AMBULATORY_CARE_PROVIDER_SITE_OTHER): Payer: Medicare Other

## 2012-09-14 DIAGNOSIS — E119 Type 2 diabetes mellitus without complications: Secondary | ICD-10-CM

## 2012-09-14 DIAGNOSIS — D3A8 Other benign neuroendocrine tumors: Secondary | ICD-10-CM | POA: Insufficient documentation

## 2012-09-14 LAB — BASIC METABOLIC PANEL
CO2: 30 mEq/L (ref 19–32)
Calcium: 9.7 mg/dL (ref 8.4–10.5)
Glucose, Bld: 192 mg/dL — ABNORMAL HIGH (ref 70–99)
Sodium: 139 mEq/L (ref 135–145)

## 2012-09-14 LAB — LIPID PANEL
Total CHOL/HDL Ratio: 5
Triglycerides: 94 mg/dL (ref 0.0–149.0)

## 2012-09-14 LAB — HEPATIC FUNCTION PANEL
AST: 15 U/L (ref 0–37)
Total Bilirubin: 0.8 mg/dL (ref 0.3–1.2)

## 2012-09-15 NOTE — Progress Notes (Signed)
Quick Note:  Please make copy of labs for patient visit. ______ 

## 2012-09-17 ENCOUNTER — Encounter: Payer: Self-pay | Admitting: Cardiology

## 2012-09-17 ENCOUNTER — Ambulatory Visit (INDEPENDENT_AMBULATORY_CARE_PROVIDER_SITE_OTHER): Payer: Medicare Other | Admitting: Cardiology

## 2012-09-17 VITALS — BP 128/76 | HR 69 | Ht 65.0 in | Wt 193.4 lb

## 2012-09-17 DIAGNOSIS — I119 Hypertensive heart disease without heart failure: Secondary | ICD-10-CM

## 2012-09-17 DIAGNOSIS — I6350 Cerebral infarction due to unspecified occlusion or stenosis of unspecified cerebral artery: Secondary | ICD-10-CM

## 2012-09-17 DIAGNOSIS — I639 Cerebral infarction, unspecified: Secondary | ICD-10-CM

## 2012-09-17 DIAGNOSIS — IMO0001 Reserved for inherently not codable concepts without codable children: Secondary | ICD-10-CM

## 2012-09-17 NOTE — Assessment & Plan Note (Signed)
The patient has not been experiencing any recent chest pain.  She does complain of lack of energy which is probably multifactorial including her high blood pressure and her diabetes and her exogenous obesity.  Also she is not sleeping well at night.

## 2012-09-17 NOTE — Assessment & Plan Note (Addendum)
The patient has not been having any hypoglycemic episodes.  Her blood sugars have been improving.  She has an appointment to go to the [facility] for her diabetes on May 20.

## 2012-09-17 NOTE — Assessment & Plan Note (Signed)
Since starting the Plavix  she has not had any further TIA or stroke symptoms.  She feels like she is losing strength and mobility in her legs.  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

## 2012-09-17 NOTE — Patient Instructions (Signed)
INCREASE YOUR POTASSIUM TO 1 DAILY  Your physician wants you to follow-up in: 4 months with fasting labs (lp/bmet/hfp/A1C)  You will receive a reminder letter in the mail two months in advance. If you don't receive a letter, please call our office to schedule the follow-up appointment.

## 2012-09-17 NOTE — Progress Notes (Signed)
[name] Date of Birth:  Aug 02, 1940 [facility] [address] [place], [place]  [address] [phone]         Fax   [phone]  History of Present Illness: This pleasant 72 year old woman is seen for a scheduled followup visit. She has a history of essential hypertension, diabetes mellitus, and dyslipidemia. She has had atypical chest pain. She is intolerant of statin drugs. We updated her lexiscan Myoview stress test on 03/17/12 and it was normal showing no evidence of ischemia and her ejection fraction was 78%. The patient has never had a cardiac catheterization. She has continued to have a lot of shortness of breath. She had an echocardiogram in 04/07/12 which showed an ejection fraction of 55-65% with grade 1 diastolic dysfunction. There is trivial aortic insufficiency.  The patient has a history of essential hypertension as well as atypical chest pain. At her last visit we started her on losartan as well as isosorbide mononitrate. She had what she thought were side effects from those drugs and she stopped both of them. She was having headaches which were probably from the isosorbide mononitrate. She needs the losartan for her kidney function and we will restart that now. She has a significant diabetic and needs olmesartan for protection because of her kidneys and her diabetes. Since last visit she has been back over to [facility] to see her neurologist who evaluated her and felt that she indeed had had a stroke. He has placed her on Plavix. Her stroke was apparently manifested by intermittent problems with speech. She states that since starting the Plavix she has had no further symptoms in this regard.    Current Outpatient Prescriptions  Medication Sig Dispense Refill  . clopidogrel (PLAVIX) 75 MG tablet Take 75 mg by mouth daily.      . colesevelam (WELCHOL) 625 MG tablet Take 1,875 mg by mouth as needed.        . furosemide (LASIX) 40 MG tablet Take 1  tablet (40 mg total) by mouth daily as needed.  30 tablet  11  . gabapentin (NEURONTIN) 100 MG capsule as directed.      . hydrochlorothiazide (HYDRODIURIL) 25 MG tablet Take 25 mg by mouth as directed. 1/4 tablet daily      . hyoscyamine (LEVBID) 0.375 MG 12 hr tablet Take 0.375 mg by mouth every 12 (twelve) hours as needed.        . insulin glargine (LANTUS) 100 UNIT/ML injection Inject 54 Units into the skin daily.  10 mL  prn  . levothyroxine (SYNTHROID, LEVOTHROID) 150 MCG tablet Take 150 mcg by mouth daily.        Marland Kitchen losartan (COZAAR) 25 MG tablet Take 1 tablet (25 mg total) by mouth daily.  30 tablet  5  . nitroGLYCERIN (NITROSTAT) 0.4 MG SL tablet DISSOLVE ONE TABLET UNDER TONGUE AS NEEDED FOR ARM/CHEST PAIN.  25 tablet  PRN  . Omeprazole-Sodium Bicarbonate (ZEGERID PO) Take by mouth as needed.        . potassium chloride SA (K-DUR,KLOR-CON) 20 MEQ tablet Take 20 mEq by mouth daily.      Marland Kitchen ZIAC 10-6.25 MG per tablet TAKE (1) TABLET DAILY AS DIRECTED.  30 tablet  12   No current facility-administered medications for this visit.    Allergies  Allergen Reactions  . Actos (Pioglitazone Hydrochloride)     swelling  . Crestor (Rosuvastatin Calcium)     LEG PAIN  . Fenofibrate   . Imdur (Isosorbide)  headache  . Lipitor (Atorvastatin Calcium)     CHEST PAIN, SOB  . Metformin And Related   . Niaspan (Niacin Er)     RASH  . Pravachol   . Ranolazine Er     EDEMA  . Zetia (Ezetimibe)     NO SLEEP, SOB    Patient Active Problem List  Diagnosis  . Hypothyroidism  . Benign hypertensive heart disease without heart failure  . Dyslipidemia  . Diabetes mellitus  . Hemangioma of liver  . Postmenopausal state  . Chest pain  . Dizziness  . CVA (cerebral vascular accident)    History  Smoking status  . Never Smoker   Smokeless tobacco  . Not on file    History  Alcohol Use No    Family History  Problem Relation Age of Onset  . Heart disease Father   . Heart disease  Brother     Review of Systems: Constitutional: no fever chills diaphoresis or fatigue or change in weight.  Head and neck: no hearing loss, no epistaxis, no photophobia or visual disturbance. Respiratory: No cough, shortness of breath or wheezing. Cardiovascular: No chest pain peripheral edema, palpitations. Gastrointestinal: No abdominal distention, no abdominal pain, no change in bowel habits hematochezia or melena. Genitourinary: No dysuria, no frequency, no urgency, no nocturia. Musculoskeletal:No arthralgias, no back pain, no gait disturbance or myalgias. Neurological: No dizziness, no headaches, no numbness, no seizures, no syncope, no weakness, no tremors. Hematologic: No lymphadenopathy, no easy bruising. Psychiatric: No confusion, no hallucinations, no sleep disturbance.    Physical Exam: Filed Vitals:   09/17/12 0916  BP: 128/76  Pulse: 69   the general appearance reveals a well-developed middle-aged woman in no distress.The head and neck exam reveals pupils equal and reactive.  Extraocular movements are full.  There is no scleral icterus.  The mouth and pharynx are normal.  The neck is supple.  The carotids reveal no bruits.  The jugular venous pressure is normal.  The  thyroid is not enlarged.  There is no lymphadenopathy.  The chest is clear to percussion and auscultation.  There are no rales or rhonchi.  Expansion of the chest is symmetrical.  The precordium is quiet.  The first heart sound is normal.  The second heart sound is physiologically split.  There is no murmur gallop rub or click.  There is no abnormal lift or heave.  The abdomen is soft and nontender.  The bowel sounds are normal.  The liver and spleen are not enlarged.  There are no abdominal masses.  There are no abdominal bruits.  Extremities reveal good pedal pulses.  There is no phlebitis or edema.  There is no cyanosis or clubbing.  Strength is normal and symmetrical in all extremities.  There is no lateralizing  weakness.  There are no sensory deficits.  The skin is warm and dry.  There is no rash.     Assessment / Plan: Continue on same medication.  We are changing her potassium up to 20 mg daily because of hypokalemia. Keep her appointment at the [facility] clinic for management of her diabetes. Recheck here in 4 months for office visit and fasting lab work

## 2012-10-30 ENCOUNTER — Other Ambulatory Visit: Payer: Self-pay | Admitting: *Deleted

## 2012-10-30 MED ORDER — LOSARTAN POTASSIUM 25 MG PO TABS: 25.0000 mg | ORAL_TABLET | Freq: Every day | ORAL | Status: DC

## 2013-01-12 ENCOUNTER — Other Ambulatory Visit (INDEPENDENT_AMBULATORY_CARE_PROVIDER_SITE_OTHER): Payer: Medicare Other

## 2013-01-12 DIAGNOSIS — IMO0001 Reserved for inherently not codable concepts without codable children: Secondary | ICD-10-CM

## 2013-01-12 DIAGNOSIS — I119 Hypertensive heart disease without heart failure: Secondary | ICD-10-CM

## 2013-01-12 LAB — LIPID PANEL: Total CHOL/HDL Ratio: 4

## 2013-01-12 LAB — BASIC METABOLIC PANEL
CO2: 27 mEq/L (ref 19–32)
Calcium: 9.8 mg/dL (ref 8.4–10.5)
Creatinine, Ser: 0.8 mg/dL (ref 0.4–1.2)
Glucose, Bld: 141 mg/dL — ABNORMAL HIGH (ref 70–99)

## 2013-01-12 LAB — HEPATIC FUNCTION PANEL
AST: 12 U/L (ref 0–37)
Alkaline Phosphatase: 68 U/L (ref 39–117)
Total Bilirubin: 0.5 mg/dL (ref 0.3–1.2)

## 2013-01-12 NOTE — Progress Notes (Signed)
Quick Note:  Please make copy of labs for patient visit. ______ 

## 2013-01-15 ENCOUNTER — Ambulatory Visit (INDEPENDENT_AMBULATORY_CARE_PROVIDER_SITE_OTHER): Payer: Medicare Other | Admitting: Cardiology

## 2013-01-15 ENCOUNTER — Encounter: Payer: Self-pay | Admitting: Cardiology

## 2013-01-15 VITALS — BP 104/60 | HR 69 | Ht 64.0 in | Wt 190.1 lb

## 2013-01-15 DIAGNOSIS — R21 Rash and other nonspecific skin eruption: Secondary | ICD-10-CM

## 2013-01-15 DIAGNOSIS — I119 Hypertensive heart disease without heart failure: Secondary | ICD-10-CM

## 2013-01-15 DIAGNOSIS — E785 Hyperlipidemia, unspecified: Secondary | ICD-10-CM

## 2013-01-15 MED ORDER — NYSTATIN-TRIAMCINOLONE 100000-0.1 UNIT/GM-% EX OINT: TOPICAL_OINTMENT | Freq: Two times a day (BID) | CUTANEOUS | Status: DC

## 2013-01-15 NOTE — Assessment & Plan Note (Signed)
Patient has developed what appears to be a fungal skin rash on the left upper abdomen.  We will give her a prescription for Mycolog cream to be applied twice a day when necessary.

## 2013-01-15 NOTE — Progress Notes (Signed)
[name] Date of Birth:  03-Mar-1941 [facility] [address] [place], [place]  [address] [phone]         Fax   [phone]  History of Present Illness: This pleasant 72 year old woman is seen for a scheduled followup visit. She has a history of essential hypertension, diabetes mellitus, and dyslipidemia. She has had atypical chest pain. She is intolerant of statin drugs. We updated her lexiscan Myoview stress test on 03/17/12 and it was normal showing no evidence of ischemia and her ejection fraction was 78%. The patient has never had a cardiac catheterization. She has continued to have a lot of shortness of breath. She had an echocardiogram in 04/07/12 which showed an ejection fraction of 55-65% with grade 1 diastolic dysfunction. There is trivial aortic insufficiency.  The patient has a history of essential hypertension as well as atypical chest pain.  Her diabetes is now being followed at the [facility] clinic at [facility].  Her last chest x-ray was on 12/21/12 elsewhere and showed a normal heart size and low lung volumes.   Current Outpatient Prescriptions  Medication Sig Dispense Refill  . clopidogrel (PLAVIX) 75 MG tablet Take 75 mg by mouth daily.      . colesevelam (WELCHOL) 625 MG tablet Take 1,875 mg by mouth as needed.        . furosemide (LASIX) 40 MG tablet Take 1 tablet (40 mg total) by mouth daily as needed.  30 tablet  11  . hydrochlorothiazide (HYDRODIURIL) 25 MG tablet Take 25 mg by mouth as directed. 1/4 tablet daily      . hyoscyamine (LEVBID) 0.375 MG 12 hr tablet Take 0.375 mg by mouth every 12 (twelve) hours as needed.        . insulin glargine (LANTUS) 100 UNIT/ML injection Inject 52 Units into the skin daily.      Marland Kitchen levothyroxine (SYNTHROID, LEVOTHROID) 150 MCG tablet Take 150 mcg by mouth daily.        Marland Kitchen losartan (COZAAR) 25 MG tablet Take 1 tablet (25 mg total) by mouth daily.  30 tablet  3  . nitroGLYCERIN (NITROSTAT) 0.4 MG SL  tablet DISSOLVE ONE TABLET UNDER TONGUE AS NEEDED FOR ARM/CHEST PAIN.  25 tablet  PRN  . potassium chloride SA (K-DUR,KLOR-CON) 20 MEQ tablet Take 20 mEq by mouth as directed. Take M-W-F      . ZIAC 10-6.25 MG per tablet TAKE (1) TABLET DAILY AS DIRECTED.  30 tablet  12  . gabapentin (NEURONTIN) 100 MG capsule as directed.      . nystatin-triamcinolone ointment (MYCOLOG) Apply topically 2 (two) times daily.  30 g  0  . Omeprazole-Sodium Bicarbonate (ZEGERID PO) Take 1 tablet by mouth daily.        No current facility-administered medications for this visit.    Allergies  Allergen Reactions  . Actos (Pioglitazone Hydrochloride)     swelling  . Crestor (Rosuvastatin Calcium)     LEG PAIN  . Fenofibrate   . Imdur (Isosorbide)     headache  . Lipitor (Atorvastatin Calcium)     CHEST PAIN, SOB  . Metformin And Related   . Niaspan (Niacin Er)     RASH  . Pravachol   . Ranolazine Er     EDEMA  . Zetia (Ezetimibe)     NO SLEEP, SOB    Patient Active Problem List   Diagnosis Date Noted  . Benign hypertensive heart disease without heart failure 09/18/2010  Priority: Medium  . Dyslipidemia 09/18/2010    Priority: Medium  . Hemangioma of liver 09/18/2010    Priority: Medium  . CVA (cerebral vascular accident) 07/31/2012  . Hypothyroidism 09/18/2010  . Type II or unspecified type diabetes mellitus without mention of complication, uncontrolled 09/18/2010  . Postmenopausal state 09/18/2010  . Chest pain 09/18/2010  . Dizziness 09/18/2010    History  Smoking status  . Never Smoker   Smokeless tobacco  . Not on file    History  Alcohol Use No    Family History  Problem Relation Age of Onset  . Heart disease Father   . Heart disease Brother     Review of Systems: Constitutional: no fever chills diaphoresis or fatigue or change in weight.  Head and neck: no hearing loss, no epistaxis, no photophobia or visual disturbance. Respiratory: No cough, shortness of breath or  wheezing. Cardiovascular: No chest pain peripheral edema, palpitations. Gastrointestinal: No abdominal distention, no abdominal pain, no change in bowel habits hematochezia or melena. Genitourinary: No dysuria, no frequency, no urgency, no nocturia. Musculoskeletal:No arthralgias, no back pain, no gait disturbance or myalgias. Neurological: No dizziness, no headaches, no numbness, no seizures, no syncope, no weakness, no tremors. Hematologic: No lymphadenopathy, no easy bruising. Psychiatric: No confusion, no hallucinations, no sleep disturbance.    Physical Exam: Filed Vitals:   01/15/13 1002  BP: 104/60  Pulse: 69   the general appearance reveals a well-developed well-nourished woman in no distress.The head and neck exam reveals pupils equal and reactive.  Extraocular movements are full.  There is no scleral icterus.  The mouth and pharynx are normal.  The neck is supple.  The carotids reveal no bruits.  The jugular venous pressure is normal.  The  thyroid is not enlarged.  There is no lymphadenopathy.  The chest is clear to percussion and auscultation.  There are no rales or rhonchi.  Expansion of the chest is symmetrical.  The precordium is quiet.  The first heart sound is normal.  The second heart sound is physiologically split.  There is no murmur gallop rub or click.  There is no abnormal lift or heave.  The abdomen is soft and nontender.  The bowel sounds are normal.  The liver and spleen are not enlarged.  There are no abdominal masses.  There are no abdominal bruits.  Extremities reveal good pedal pulses.  There is no phlebitis or edema.  There is no cyanosis or clubbing.  Strength is normal and symmetrical in all extremities.  There is no lateralizing weakness.  There are no sensory deficits.  The skin is warm and dry.  There is a 3 x 5 cm oval erythematous skin rash on the left upper abdomen suggestive of tinea corporis..     Assessment / Plan: Continue on same medication recheck in  4 months for followup office visit EKG lipid panel hepatic function panel and basal metabolic panel.

## 2013-01-15 NOTE — Patient Instructions (Addendum)
Your physician recommends that you schedule a follow-up appointment in: 4 months  Your physician recommends that you return for a FASTING lipid profile: 4 months   Your physician has recommended you make the following change in your medication:   Apply mycolog cream twice daily to affected area

## 2013-01-15 NOTE — Assessment & Plan Note (Signed)
Her blood pressure is remaining stable on current therapy.  No dizziness or syncope.  No symptoms of CHF.

## 2013-01-15 NOTE — Assessment & Plan Note (Signed)
Patient has a history of dyslipidemia and is on WelChol.  She is intolerant of statins.  We reviewed her labs which are satisfactory

## 2013-02-26 ENCOUNTER — Other Ambulatory Visit: Payer: Self-pay | Admitting: Cardiology

## 2013-04-12 ENCOUNTER — Encounter: Payer: Self-pay | Admitting: Cardiology

## 2013-04-12 ENCOUNTER — Ambulatory Visit (INDEPENDENT_AMBULATORY_CARE_PROVIDER_SITE_OTHER): Payer: Medicare Other | Admitting: Cardiology

## 2013-04-12 ENCOUNTER — Telehealth: Payer: Self-pay | Admitting: Cardiology

## 2013-04-12 VITALS — BP 128/72 | HR 75 | Ht 64.0 in | Wt 188.1 lb

## 2013-04-12 DIAGNOSIS — D1739 Benign lipomatous neoplasm of skin and subcutaneous tissue of other sites: Secondary | ICD-10-CM

## 2013-04-12 DIAGNOSIS — R079 Chest pain, unspecified: Secondary | ICD-10-CM | POA: Insufficient documentation

## 2013-04-12 DIAGNOSIS — E785 Hyperlipidemia, unspecified: Secondary | ICD-10-CM

## 2013-04-12 DIAGNOSIS — C4499 Other specified malignant neoplasm of skin, unspecified: Secondary | ICD-10-CM | POA: Insufficient documentation

## 2013-04-12 DIAGNOSIS — I700 Atherosclerosis of aorta: Secondary | ICD-10-CM | POA: Insufficient documentation

## 2013-04-12 DIAGNOSIS — IMO0001 Reserved for inherently not codable concepts without codable children: Secondary | ICD-10-CM

## 2013-04-12 DIAGNOSIS — D1730 Benign lipomatous neoplasm of skin and subcutaneous tissue of unspecified sites: Secondary | ICD-10-CM | POA: Insufficient documentation

## 2013-04-12 DIAGNOSIS — I119 Hypertensive heart disease without heart failure: Secondary | ICD-10-CM

## 2013-04-12 NOTE — Assessment & Plan Note (Signed)
She brought in her blood pressure readings which are still quite labile.  He is in the office today her blood pressure is satisfactory.  We will keep her on her current medication.

## 2013-04-12 NOTE — Patient Instructions (Signed)
Your physician recommends that you continue on your current medications as directed. Please refer to the Current Medication list given to you today.  Keep your December appointment

## 2013-04-12 NOTE — Assessment & Plan Note (Signed)
The patient has a history of dyslipidemia.  We will plan to recheck her lipids when she returns for her visit in December

## 2013-04-12 NOTE — Progress Notes (Signed)
Whitney Newton Date of Birth:  August 20, 1940 [address] [place], [place]  [address] [phone]         Fax   [phone]  History of Present Illness: This pleasant 72 year old woman is seen for a scheduled followup visit. She has a history of essential hypertension, diabetes mellitus, and dyslipidemia. She has had atypical chest pain. She is intolerant of statin drugs. We updated her lexiscan Myoview stress test on 03/17/12 and it was normal showing no evidence of ischemia and her ejection fraction was 78%. The patient has never had a cardiac catheterization. She has continued to have a lot of shortness of breath. She had an echocardiogram in 04/07/12 which showed an ejection fraction of 55-65% with grade 1 diastolic dysfunction. There is trivial aortic insufficiency.  The patient has a history of essential hypertension as well as atypical chest pain.  Her diabetes is now being followed at the [facility] clinic at [facility].  Her last chest x-ray was on 12/21/12 elsewhere and showed a normal heart size and low lung volumes.  She had a recent CT scan of the chest on 03/23/13 at [facility] which showed normal heart size and demonstrated atherosclerosis of the aorta. The patient has a past history of dermatofibrosarcoma of the spine.  She also has a past history of angiolipoma of the abdominal wall.  Current Outpatient Prescriptions  Medication Sig Dispense Refill  . clopidogrel (PLAVIX) 75 MG tablet Take 75 mg by mouth daily.      . colesevelam (WELCHOL) 625 MG tablet Take 1,875 mg by mouth as needed.        . gabapentin (NEURONTIN) 100 MG capsule as directed.      . hydrochlorothiazide (HYDRODIURIL) 25 MG tablet Take 25 mg by mouth as directed. 1/4 tablet daily      . hyoscyamine (LEVBID) 0.375 MG 12 hr tablet Take 0.375 mg by mouth every 12 (twelve) hours as needed.        . insulin glargine (LANTUS) 100 UNIT/ML injection Inject 46 Units into the skin daily.       Marland Kitchen  levothyroxine (SYNTHROID, LEVOTHROID) 150 MCG tablet Take 150 mcg by mouth daily.        Marland Kitchen losartan (COZAAR) 25 MG tablet TAKE 1 TABLET ONCE DAILY.  30 tablet  3  . nitroGLYCERIN (NITROSTAT) 0.4 MG SL tablet DISSOLVE ONE TABLET UNDER TONGUE AS NEEDED FOR ARM/CHEST PAIN.  25 tablet  PRN  . nystatin-triamcinolone ointment (MYCOLOG) Apply topically 2 (two) times daily.  30 g  0  . Omeprazole-Sodium Bicarbonate (ZEGERID PO) Take 1 tablet by mouth daily.       . potassium chloride SA (K-DUR,KLOR-CON) 20 MEQ tablet Take 20 mEq by mouth as directed. Take M-W-F      . ZIAC 10-6.25 MG per tablet TAKE (1) TABLET DAILY AS DIRECTED.  30 tablet  12   No current facility-administered medications for this visit.    Allergies  Allergen Reactions  . Actos [Pioglitazone Hydrochloride]     swelling  . Crestor [Rosuvastatin Calcium]     LEG PAIN  . Fenofibrate   . Imdur [Isosorbide]     headache  . Lipitor [Atorvastatin Calcium]     CHEST PAIN, SOB  . Metformin And Related   . Niaspan [Niacin Er]     RASH  . Pravachol   . Ranolazine Er     EDEMA  . Zetia [Ezetimibe]     NO SLEEP, SOB  Patient Active Problem List   Diagnosis Date Noted  . Benign hypertensive heart disease without heart failure 09/18/2010    Priority: Medium  . Dyslipidemia 09/18/2010    Priority: Medium  . Hemangioma of liver 09/18/2010    Priority: Medium  . Atherosclerosis of aorta 04/12/2013  . 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 Skin rash 01/15/2013  . CVA (cerebral vascular accident) 07/31/2012  . Hypothyroidism 09/18/2010  . Type II or unspecified type diabetes mellitus without mention of complication, uncontrolled 09/18/2010  . Postmenopausal state 09/18/2010  . Chest pain 09/18/2010  . Dizziness 09/18/2010    History  Smoking status  . Never Smoker   Smokeless tobacco  . Not on file    History  Alcohol Use No    Family History  Problem Relation Age of  Onset  . Heart disease Father   . Heart disease Brother     Review of Systems: Constitutional: no fever chills diaphoresis or fatigue or change in weight.  Head and neck: no hearing loss, no epistaxis, no photophobia or visual disturbance. Respiratory: No cough, shortness of breath or wheezing. Cardiovascular: No chest pain peripheral edema, palpitations. Gastrointestinal: No abdominal distention, no abdominal pain, no change in bowel habits hematochezia or melena. Genitourinary: No dysuria, no frequency, no urgency, no nocturia. Musculoskeletal:No arthralgias, no back pain, no gait disturbance or myalgias. Neurological: No dizziness, no headaches, no numbness, no seizures, no syncope, no weakness, no tremors. Hematologic: No lymphadenopathy, no easy bruising. Psychiatric: No confusion, no hallucinations, no sleep disturbance.    Physical Exam: Filed Vitals:   04/12/13 1536  BP: 128/72  Pulse: 75   the general appearance reveals a well-developed well-nourished woman in no distress.The head and neck exam reveals pupils equal and reactive.  Extraocular movements are full.  There is no scleral icterus.  The mouth and pharynx are normal.  The neck is supple.  The carotids reveal no bruits.  The jugular venous pressure is normal.  The  thyroid is not enlarged.  There is no lymphadenopathy.  The chest is clear to percussion and auscultation.  There are no rales or rhonchi.  Expansion of the chest is symmetrical.  The precordium is quiet.  The first heart sound is normal.  The second heart sound is physiologically split.  There is no murmur gallop rub or click.  There is no abnormal lift or heave.  The abdomen is soft and nontender.  The bowel sounds are normal.  The liver and spleen are not enlarged.  There are no abdominal masses.  There are no abdominal bruits.  Extremities reveal good pedal pulses.  There is no phlebitis or edema.  There is no cyanosis or clubbing.  Strength is normal and  symmetrical in all extremities.  There is no lateralizing weakness.  There are no sensory deficits.  The skin is warm and dry.       Assessment / Plan: Continue on same medication recheck in 2 months for followup office visit lipid panel hepatic function panel and basal metabolic panel.

## 2013-04-12 NOTE — Assessment & Plan Note (Signed)
She has not been having any recent hypoglycemic episode

## 2013-04-12 NOTE — Telephone Encounter (Signed)
Scheduled appointment for patient today, advised patient

## 2013-04-12 NOTE — Telephone Encounter (Signed)
New problem   Pt is experiencing nausea, dizziness and chest pain. Need to know if she need to come in. She isn't having any chest pain at the present. Please call pt

## 2013-04-12 NOTE — Assessment & Plan Note (Signed)
The patient came in today as a work in because of concern over some right-sided chest discomfort.  EKG today shows incomplete right bundle branch block and no ischemic changes.  The pain is not suggestive of myocardial ischemia.  The patient is to continue her current medical therapy

## 2013-04-26 ENCOUNTER — Other Ambulatory Visit: Payer: Self-pay | Admitting: Cardiology

## 2013-05-12 DIAGNOSIS — I639 Cerebral infarction, unspecified: Secondary | ICD-10-CM | POA: Insufficient documentation

## 2013-05-12 HISTORY — DX: Cerebral infarction, unspecified: I63.9

## 2013-05-15 DIAGNOSIS — I69959 Hemiplegia and hemiparesis following unspecified cerebrovascular disease affecting unspecified side: Secondary | ICD-10-CM | POA: Diagnosis not present

## 2013-05-15 DIAGNOSIS — I10 Essential (primary) hypertension: Secondary | ICD-10-CM | POA: Diagnosis not present

## 2013-05-15 DIAGNOSIS — Z794 Long term (current) use of insulin: Secondary | ICD-10-CM | POA: Diagnosis not present

## 2013-05-15 DIAGNOSIS — Z5189 Encounter for other specified aftercare: Secondary | ICD-10-CM | POA: Diagnosis not present

## 2013-05-15 DIAGNOSIS — IMO0001 Reserved for inherently not codable concepts without codable children: Secondary | ICD-10-CM | POA: Diagnosis not present

## 2013-05-17 ENCOUNTER — Other Ambulatory Visit: Payer: Medicare Other

## 2013-05-17 ENCOUNTER — Encounter: Payer: Self-pay | Admitting: Cardiology

## 2013-05-17 ENCOUNTER — Ambulatory Visit (INDEPENDENT_AMBULATORY_CARE_PROVIDER_SITE_OTHER): Payer: Medicare Other | Admitting: Cardiology

## 2013-05-17 VITALS — BP 120/80 | HR 66 | Ht 64.0 in | Wt 184.0 lb

## 2013-05-17 DIAGNOSIS — I639 Cerebral infarction, unspecified: Secondary | ICD-10-CM

## 2013-05-17 DIAGNOSIS — I6350 Cerebral infarction due to unspecified occlusion or stenosis of unspecified cerebral artery: Secondary | ICD-10-CM

## 2013-05-17 DIAGNOSIS — E785 Hyperlipidemia, unspecified: Secondary | ICD-10-CM

## 2013-05-17 DIAGNOSIS — I119 Hypertensive heart disease without heart failure: Secondary | ICD-10-CM

## 2013-05-17 DIAGNOSIS — IMO0001 Reserved for inherently not codable concepts without codable children: Secondary | ICD-10-CM

## 2013-05-17 DIAGNOSIS — R079 Chest pain, unspecified: Secondary | ICD-10-CM

## 2013-05-17 DIAGNOSIS — R110 Nausea: Secondary | ICD-10-CM

## 2013-05-17 MED ORDER — ONDANSETRON 4 MG PO TBDP: 4.0000 mg | ORAL_TABLET | Freq: Two times a day (BID) | ORAL | Status: DC | PRN

## 2013-05-17 NOTE — Assessment & Plan Note (Signed)
The patient brought in some recent lab work done at [facility] last week so we did not have to draw any lab work today

## 2013-05-17 NOTE — Progress Notes (Signed)
Whitney Newton Date of Birth:  1941-02-21 [id number] [facility] [address] [place], [place]  [address] [phone]         Fax   [phone]  History of Present Illness: This pleasant 72 year old woman is seen for a scheduled followup visit.  Since last visit she has had a stroke.  A stroke started 8 days ago on a Sunday night.  She felt restless all night and had a headache back pain and nausea.  She rested the following day and then on Tuesday she kept her regular appointment with her endocrinologist at [facility].  They noted that she was different and worked around 4 a new stroke.  The MRI showed a new stroke on the right side.  She had carotid Dopplers which did not show any obstructive lesions but she did have some plaque and they put her back on aspirin and continued her Plavix.  The patient had a echocardiogram which was a bubble study and did not show any evidence of right-to-left shunt.  She was hospitalized for 3 days.  She will be getting home physical therapy.   She has a history of essential hypertension, diabetes mellitus, and dyslipidemia. She has had atypical chest pain. She is intolerant of statin drugs. We updated her lexiscan Myoview stress test on 03/17/12 and it was normal showing no evidence of ischemia and her ejection fraction was 78%. The patient has never had a cardiac catheterization. She has continued to have a lot of shortness of breath. She had an echocardiogram in 04/07/12 which showed an ejection fraction of 55-65% with grade 1 diastolic dysfunction. There is trivial aortic insufficiency.  The patient has a history of essential hypertension as well as atypical chest pain.  Her diabetes is now being followed at the [facility] clinic at [facility].  Her last chest x-ray was on 12/21/12 elsewhere and showed a normal heart size and low lung volumes.  She had a recent CT scan of the chest on 03/23/13 at [facility] which showed normal heart size and demonstrated  atherosclerosis of the aorta. The patient has a past history of dermatofibrosarcoma of the spine.  She also has a past history of angiolipoma of the abdominal wall.  Current Outpatient Prescriptions  Medication Sig Dispense Refill  . aspirin 81 MG tablet Take 81 mg by mouth daily.      . bisoprolol-hydrochlorothiazide (ZIAC) 10-6.25 MG per tablet TAKE (1) TABLET DAILY AS DIRECTED.  30 tablet  5  . clopidogrel (PLAVIX) 75 MG tablet Take 75 mg by mouth daily.      . colesevelam (WELCHOL) 625 MG tablet Take 1,875 mg by mouth as needed.        . gabapentin (NEURONTIN) 100 MG capsule as needed.       . hydrochlorothiazide (HYDRODIURIL) 25 MG tablet 1/4 tablet daily      . hyoscyamine (LEVBID) 0.375 MG 12 hr tablet Take 0.375 mg by mouth every 12 (twelve) hours as needed.        . insulin glargine (LANTUS) 100 UNIT/ML injection Inject 46 Units into the skin daily.       Marland Kitchen levothyroxine (SYNTHROID, LEVOTHROID) 150 MCG tablet Take 150 mcg by mouth daily.        Marland Kitchen losartan (COZAAR) 25 MG tablet TAKE 1 TABLET ONCE DAILY.  30 tablet  3  . nitroGLYCERIN (NITROSTAT) 0.4 MG SL tablet DISSOLVE ONE TABLET UNDER TONGUE AS NEEDED FOR ARM/CHEST PAIN.  25 tablet  PRN  .  nystatin-triamcinolone ointment (MYCOLOG) Apply topically 2 (two) times daily.  30 g  0  . Omeprazole-Sodium Bicarbonate (ZEGERID PO) Take 1 tablet by mouth daily.       . potassium chloride SA (K-DUR,KLOR-CON) 20 MEQ tablet Take 20 mEq by mouth. Take M-W-F      . ondansetron (ZOFRAN-ODT) 4 MG disintegrating tablet Take 1 tablet (4 mg total) by mouth 2 (two) times daily as needed for nausea or vomiting.  30 tablet  1   No current facility-administered medications for this visit.    Allergies  Allergen Reactions  . Actos [Pioglitazone Hydrochloride]     swelling  . Crestor [Rosuvastatin Calcium]     LEG PAIN  . Fenofibrate   . Imdur [Isosorbide]     headache  . Lipitor [Atorvastatin Calcium]     CHEST PAIN, SOB  . Metformin And Related    . Niaspan [Niacin Er]     RASH  . Pravachol   . Ranolazine Er     EDEMA  . Zetia [Ezetimibe]     NO SLEEP, SOB    Patient Active Problem List   Diagnosis Date Noted  . Benign hypertensive heart disease without heart failure 09/18/2010    Priority: Medium  . Dyslipidemia 09/18/2010    Priority: Medium  . Hemangioma of liver 09/18/2010    Priority: Medium  . Atherosclerosis of aorta 04/12/2013  . 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 Chest pain 04/12/2013  . Skin rash 01/15/2013  . CVA (cerebral vascular accident) 07/31/2012  . Hypothyroidism 09/18/2010  . Type II or unspecified type diabetes mellitus without mention of complication, uncontrolled 09/18/2010  . Postmenopausal state 09/18/2010  . Chest pain 09/18/2010  . Dizziness 09/18/2010    History  Smoking status  . Never Smoker   Smokeless tobacco  . Not on file    History  Alcohol Use No    Family History  Problem Relation Age of Onset  . Heart disease Father   . Heart disease Brother     Review of Systems: Constitutional: no fever chills diaphoresis or fatigue or change in weight.  Head and neck: no hearing loss, no epistaxis, no photophobia or visual disturbance. Respiratory: No cough, shortness of breath or wheezing. Cardiovascular: No chest pain peripheral edema, palpitations. Gastrointestinal: No abdominal distention, no abdominal pain, no change in bowel habits hematochezia or melena. Genitourinary: No dysuria, no frequency, no urgency, no nocturia. Musculoskeletal:No arthralgias, no back pain, no gait disturbance or myalgias. Neurological: No dizziness, no headaches, no numbness, no seizures, no syncope, no weakness, no tremors. Hematologic: No lymphadenopathy, no easy bruising. Psychiatric: No confusion, no hallucinations, no sleep disturbance.    Physical Exam: Filed Vitals:   05/17/13 1023  BP: 120/80  Pulse: 66   the general appearance  reveals a well-developed well-nourished woman in no distress.The head and neck exam reveals pupils equal and reactive.  Extraocular movements are full.  There is no scleral icterus.  The mouth and pharynx are normal.  The neck is supple.  The carotids reveal no bruits.  The jugular venous pressure is normal.  The  thyroid is not enlarged.  There is no lymphadenopathy.  The chest is clear to percussion and auscultation.  There are no rales or rhonchi.  Expansion of the chest is symmetrical.  The precordium is quiet.  The first heart sound is normal.  The second heart sound is physiologically split.  There is no murmur gallop rub or click.  There  is no abnormal lift or heave.  The abdomen is soft and nontender.  The bowel sounds are normal.  The liver and spleen are not enlarged.  There are no abdominal masses.  There are no abdominal bruits.  Extremities reveal good pedal pulses.  There is no phlebitis or edema.  There is no cyanosis or clubbing.  Strength is decreased on the left side and the patient has a more ataxic gait today.     The skin is warm and dry.    EKG today shows normal sinus rhythm and right bundle branch block and is unchanged from 04/12/12   Assessment / Plan: Continue on same medication recheck in 4 months for office visit lipid panel hepatic function panel basal metabolic panel and A1c.  Stroke rehabilitation with physical therapy at home 3 times a week.  Remain on dual antiplatelet therapy for her nonobstructive carotid artery disease.

## 2013-05-17 NOTE — Assessment & Plan Note (Signed)
Pressure has remained stable on current therapy.

## 2013-05-17 NOTE — Assessment & Plan Note (Signed)
The patient has not been experiencing any hypoglycemic episodes.  She has been experiencing some nausea and poor appetite

## 2013-05-17 NOTE — Patient Instructions (Signed)
Start Zofran 4 mg twice a day as needed for nausea, Rx sent to pharmacy  Your physician wants you to follow-up in: 4 months with fasting labs (lp/bmet/hfp)  You will receive a reminder letter in the mail two months in advance. If you don't receive a letter, please call our office to schedule the follow-up appointment.

## 2013-05-17 NOTE — Assessment & Plan Note (Signed)
Patient states that her recent brain MRI showed 3 strokes all of which are on the right side of the brain.  She has experienced left-sided weakness of her arm and leg.  She will be getting physical therapy.  The only change in her regimen was to restart baby aspirin and continue prior dose of Plavix.  2-D echocardiogram showed no evidence of atrial shunting and no source of embolus, done at [facility] last week

## 2013-05-18 DIAGNOSIS — Z5189 Encounter for other specified aftercare: Secondary | ICD-10-CM | POA: Diagnosis not present

## 2013-05-18 DIAGNOSIS — I69959 Hemiplegia and hemiparesis following unspecified cerebrovascular disease affecting unspecified side: Secondary | ICD-10-CM | POA: Diagnosis not present

## 2013-05-18 DIAGNOSIS — I10 Essential (primary) hypertension: Secondary | ICD-10-CM | POA: Diagnosis not present

## 2013-05-18 DIAGNOSIS — Z794 Long term (current) use of insulin: Secondary | ICD-10-CM | POA: Diagnosis not present

## 2013-05-18 DIAGNOSIS — IMO0001 Reserved for inherently not codable concepts without codable children: Secondary | ICD-10-CM | POA: Diagnosis not present

## 2013-05-19 DIAGNOSIS — Z794 Long term (current) use of insulin: Secondary | ICD-10-CM | POA: Diagnosis not present

## 2013-05-19 DIAGNOSIS — I10 Essential (primary) hypertension: Secondary | ICD-10-CM | POA: Diagnosis not present

## 2013-05-19 DIAGNOSIS — IMO0001 Reserved for inherently not codable concepts without codable children: Secondary | ICD-10-CM | POA: Diagnosis not present

## 2013-05-19 DIAGNOSIS — Z5189 Encounter for other specified aftercare: Secondary | ICD-10-CM | POA: Diagnosis not present

## 2013-05-19 DIAGNOSIS — I69959 Hemiplegia and hemiparesis following unspecified cerebrovascular disease affecting unspecified side: Secondary | ICD-10-CM | POA: Diagnosis not present

## 2013-05-20 DIAGNOSIS — Z794 Long term (current) use of insulin: Secondary | ICD-10-CM | POA: Diagnosis not present

## 2013-05-20 DIAGNOSIS — IMO0001 Reserved for inherently not codable concepts without codable children: Secondary | ICD-10-CM | POA: Diagnosis not present

## 2013-05-20 DIAGNOSIS — Z5189 Encounter for other specified aftercare: Secondary | ICD-10-CM | POA: Diagnosis not present

## 2013-05-20 DIAGNOSIS — I10 Essential (primary) hypertension: Secondary | ICD-10-CM | POA: Diagnosis not present

## 2013-05-20 DIAGNOSIS — I69959 Hemiplegia and hemiparesis following unspecified cerebrovascular disease affecting unspecified side: Secondary | ICD-10-CM | POA: Diagnosis not present

## 2013-05-21 DIAGNOSIS — I69959 Hemiplegia and hemiparesis following unspecified cerebrovascular disease affecting unspecified side: Secondary | ICD-10-CM | POA: Diagnosis not present

## 2013-05-21 DIAGNOSIS — IMO0001 Reserved for inherently not codable concepts without codable children: Secondary | ICD-10-CM | POA: Diagnosis not present

## 2013-05-21 DIAGNOSIS — I10 Essential (primary) hypertension: Secondary | ICD-10-CM | POA: Diagnosis not present

## 2013-05-21 DIAGNOSIS — Z5189 Encounter for other specified aftercare: Secondary | ICD-10-CM | POA: Diagnosis not present

## 2013-05-21 DIAGNOSIS — Z794 Long term (current) use of insulin: Secondary | ICD-10-CM | POA: Diagnosis not present

## 2013-05-24 DIAGNOSIS — I69959 Hemiplegia and hemiparesis following unspecified cerebrovascular disease affecting unspecified side: Secondary | ICD-10-CM | POA: Diagnosis not present

## 2013-05-24 DIAGNOSIS — I10 Essential (primary) hypertension: Secondary | ICD-10-CM | POA: Diagnosis not present

## 2013-05-24 DIAGNOSIS — IMO0001 Reserved for inherently not codable concepts without codable children: Secondary | ICD-10-CM | POA: Diagnosis not present

## 2013-05-24 DIAGNOSIS — Z5189 Encounter for other specified aftercare: Secondary | ICD-10-CM | POA: Diagnosis not present

## 2013-05-24 DIAGNOSIS — Z794 Long term (current) use of insulin: Secondary | ICD-10-CM | POA: Diagnosis not present

## 2013-05-26 DIAGNOSIS — IMO0001 Reserved for inherently not codable concepts without codable children: Secondary | ICD-10-CM | POA: Diagnosis not present

## 2013-05-26 DIAGNOSIS — Z5189 Encounter for other specified aftercare: Secondary | ICD-10-CM | POA: Diagnosis not present

## 2013-05-26 DIAGNOSIS — I69959 Hemiplegia and hemiparesis following unspecified cerebrovascular disease affecting unspecified side: Secondary | ICD-10-CM | POA: Diagnosis not present

## 2013-05-26 DIAGNOSIS — Z794 Long term (current) use of insulin: Secondary | ICD-10-CM | POA: Diagnosis not present

## 2013-05-26 DIAGNOSIS — I10 Essential (primary) hypertension: Secondary | ICD-10-CM | POA: Diagnosis not present

## 2013-05-27 DIAGNOSIS — I69959 Hemiplegia and hemiparesis following unspecified cerebrovascular disease affecting unspecified side: Secondary | ICD-10-CM | POA: Diagnosis not present

## 2013-05-27 DIAGNOSIS — IMO0001 Reserved for inherently not codable concepts without codable children: Secondary | ICD-10-CM | POA: Diagnosis not present

## 2013-05-27 DIAGNOSIS — Z794 Long term (current) use of insulin: Secondary | ICD-10-CM | POA: Diagnosis not present

## 2013-05-27 DIAGNOSIS — Z5189 Encounter for other specified aftercare: Secondary | ICD-10-CM | POA: Diagnosis not present

## 2013-05-27 DIAGNOSIS — I10 Essential (primary) hypertension: Secondary | ICD-10-CM | POA: Diagnosis not present

## 2013-05-31 DIAGNOSIS — I10 Essential (primary) hypertension: Secondary | ICD-10-CM | POA: Diagnosis not present

## 2013-05-31 DIAGNOSIS — Z794 Long term (current) use of insulin: Secondary | ICD-10-CM | POA: Diagnosis not present

## 2013-05-31 DIAGNOSIS — IMO0001 Reserved for inherently not codable concepts without codable children: Secondary | ICD-10-CM | POA: Diagnosis not present

## 2013-05-31 DIAGNOSIS — I69959 Hemiplegia and hemiparesis following unspecified cerebrovascular disease affecting unspecified side: Secondary | ICD-10-CM | POA: Diagnosis not present

## 2013-05-31 DIAGNOSIS — Z5189 Encounter for other specified aftercare: Secondary | ICD-10-CM | POA: Diagnosis not present

## 2013-06-01 DIAGNOSIS — I69959 Hemiplegia and hemiparesis following unspecified cerebrovascular disease affecting unspecified side: Secondary | ICD-10-CM | POA: Diagnosis not present

## 2013-06-01 DIAGNOSIS — I10 Essential (primary) hypertension: Secondary | ICD-10-CM | POA: Diagnosis not present

## 2013-06-01 DIAGNOSIS — Z794 Long term (current) use of insulin: Secondary | ICD-10-CM | POA: Diagnosis not present

## 2013-06-01 DIAGNOSIS — Z5189 Encounter for other specified aftercare: Secondary | ICD-10-CM | POA: Diagnosis not present

## 2013-06-01 DIAGNOSIS — IMO0001 Reserved for inherently not codable concepts without codable children: Secondary | ICD-10-CM | POA: Diagnosis not present

## 2013-06-02 DIAGNOSIS — Z5189 Encounter for other specified aftercare: Secondary | ICD-10-CM | POA: Diagnosis not present

## 2013-06-02 DIAGNOSIS — Z794 Long term (current) use of insulin: Secondary | ICD-10-CM | POA: Diagnosis not present

## 2013-06-02 DIAGNOSIS — I69959 Hemiplegia and hemiparesis following unspecified cerebrovascular disease affecting unspecified side: Secondary | ICD-10-CM | POA: Diagnosis not present

## 2013-06-02 DIAGNOSIS — IMO0001 Reserved for inherently not codable concepts without codable children: Secondary | ICD-10-CM | POA: Diagnosis not present

## 2013-06-02 DIAGNOSIS — I10 Essential (primary) hypertension: Secondary | ICD-10-CM | POA: Diagnosis not present

## 2013-06-07 DIAGNOSIS — Z794 Long term (current) use of insulin: Secondary | ICD-10-CM | POA: Diagnosis not present

## 2013-06-07 DIAGNOSIS — Z5189 Encounter for other specified aftercare: Secondary | ICD-10-CM | POA: Diagnosis not present

## 2013-06-07 DIAGNOSIS — IMO0001 Reserved for inherently not codable concepts without codable children: Secondary | ICD-10-CM | POA: Diagnosis not present

## 2013-06-07 DIAGNOSIS — I69959 Hemiplegia and hemiparesis following unspecified cerebrovascular disease affecting unspecified side: Secondary | ICD-10-CM | POA: Diagnosis not present

## 2013-06-07 DIAGNOSIS — I10 Essential (primary) hypertension: Secondary | ICD-10-CM | POA: Diagnosis not present

## 2013-06-09 DIAGNOSIS — Z794 Long term (current) use of insulin: Secondary | ICD-10-CM | POA: Diagnosis not present

## 2013-06-09 DIAGNOSIS — IMO0001 Reserved for inherently not codable concepts without codable children: Secondary | ICD-10-CM | POA: Diagnosis not present

## 2013-06-09 DIAGNOSIS — I10 Essential (primary) hypertension: Secondary | ICD-10-CM | POA: Diagnosis not present

## 2013-06-09 DIAGNOSIS — I69959 Hemiplegia and hemiparesis following unspecified cerebrovascular disease affecting unspecified side: Secondary | ICD-10-CM | POA: Diagnosis not present

## 2013-06-09 DIAGNOSIS — Z5189 Encounter for other specified aftercare: Secondary | ICD-10-CM | POA: Diagnosis not present

## 2013-06-11 DIAGNOSIS — I10 Essential (primary) hypertension: Secondary | ICD-10-CM | POA: Diagnosis not present

## 2013-06-11 DIAGNOSIS — IMO0001 Reserved for inherently not codable concepts without codable children: Secondary | ICD-10-CM | POA: Diagnosis not present

## 2013-06-11 DIAGNOSIS — Z794 Long term (current) use of insulin: Secondary | ICD-10-CM | POA: Diagnosis not present

## 2013-06-11 DIAGNOSIS — Z5189 Encounter for other specified aftercare: Secondary | ICD-10-CM | POA: Diagnosis not present

## 2013-06-11 DIAGNOSIS — I69959 Hemiplegia and hemiparesis following unspecified cerebrovascular disease affecting unspecified side: Secondary | ICD-10-CM | POA: Diagnosis not present

## 2013-06-14 DIAGNOSIS — R1310 Dysphagia, unspecified: Secondary | ICD-10-CM | POA: Diagnosis not present

## 2013-06-14 DIAGNOSIS — E785 Hyperlipidemia, unspecified: Secondary | ICD-10-CM | POA: Diagnosis not present

## 2013-06-14 DIAGNOSIS — R4701 Aphasia: Secondary | ICD-10-CM | POA: Diagnosis not present

## 2013-06-14 DIAGNOSIS — E119 Type 2 diabetes mellitus without complications: Secondary | ICD-10-CM | POA: Diagnosis not present

## 2013-06-14 DIAGNOSIS — G4730 Sleep apnea, unspecified: Secondary | ICD-10-CM | POA: Diagnosis not present

## 2013-06-14 DIAGNOSIS — N3941 Urge incontinence: Secondary | ICD-10-CM | POA: Diagnosis not present

## 2013-06-14 DIAGNOSIS — I10 Essential (primary) hypertension: Secondary | ICD-10-CM | POA: Diagnosis not present

## 2013-06-14 DIAGNOSIS — I6350 Cerebral infarction due to unspecified occlusion or stenosis of unspecified cerebral artery: Secondary | ICD-10-CM | POA: Diagnosis not present

## 2013-06-14 DIAGNOSIS — M5104 Intervertebral disc disorders with myelopathy, thoracic region: Secondary | ICD-10-CM | POA: Diagnosis not present

## 2013-06-23 ENCOUNTER — Other Ambulatory Visit: Payer: Self-pay | Admitting: Dermatology

## 2013-06-23 DIAGNOSIS — L821 Other seborrheic keratosis: Secondary | ICD-10-CM | POA: Diagnosis not present

## 2013-06-23 DIAGNOSIS — D1801 Hemangioma of skin and subcutaneous tissue: Secondary | ICD-10-CM | POA: Diagnosis not present

## 2013-06-23 DIAGNOSIS — D239 Other benign neoplasm of skin, unspecified: Secondary | ICD-10-CM | POA: Diagnosis not present

## 2013-06-23 DIAGNOSIS — D1739 Benign lipomatous neoplasm of skin and subcutaneous tissue of other sites: Secondary | ICD-10-CM | POA: Diagnosis not present

## 2013-06-23 DIAGNOSIS — D485 Neoplasm of uncertain behavior of skin: Secondary | ICD-10-CM | POA: Diagnosis not present

## 2013-06-23 DIAGNOSIS — L819 Disorder of pigmentation, unspecified: Secondary | ICD-10-CM | POA: Diagnosis not present

## 2013-06-23 DIAGNOSIS — L919 Hypertrophic disorder of the skin, unspecified: Secondary | ICD-10-CM | POA: Diagnosis not present

## 2013-06-23 DIAGNOSIS — L909 Atrophic disorder of skin, unspecified: Secondary | ICD-10-CM | POA: Diagnosis not present

## 2013-06-23 DIAGNOSIS — L219 Seborrheic dermatitis, unspecified: Secondary | ICD-10-CM | POA: Diagnosis not present

## 2013-06-29 ENCOUNTER — Other Ambulatory Visit: Payer: Self-pay | Admitting: Cardiology

## 2013-07-01 ENCOUNTER — Other Ambulatory Visit: Payer: Self-pay

## 2013-07-01 DIAGNOSIS — E119 Type 2 diabetes mellitus without complications: Secondary | ICD-10-CM | POA: Diagnosis not present

## 2013-07-01 DIAGNOSIS — L608 Other nail disorders: Secondary | ICD-10-CM | POA: Diagnosis not present

## 2013-07-01 DIAGNOSIS — L851 Acquired keratosis [keratoderma] palmaris et plantaris: Secondary | ICD-10-CM | POA: Diagnosis not present

## 2013-07-01 MED ORDER — CLOPIDOGREL BISULFATE 75 MG PO TABS: 75.0000 mg | ORAL_TABLET | Freq: Every day | ORAL | Status: DC

## 2013-07-05 DIAGNOSIS — M546 Pain in thoracic spine: Secondary | ICD-10-CM | POA: Diagnosis not present

## 2013-07-09 DIAGNOSIS — M546 Pain in thoracic spine: Secondary | ICD-10-CM | POA: Diagnosis not present

## 2013-07-12 DIAGNOSIS — M546 Pain in thoracic spine: Secondary | ICD-10-CM | POA: Diagnosis not present

## 2013-07-16 DIAGNOSIS — M546 Pain in thoracic spine: Secondary | ICD-10-CM | POA: Diagnosis not present

## 2013-07-19 DIAGNOSIS — M546 Pain in thoracic spine: Secondary | ICD-10-CM | POA: Diagnosis not present

## 2013-07-23 DIAGNOSIS — M546 Pain in thoracic spine: Secondary | ICD-10-CM | POA: Diagnosis not present

## 2013-07-26 DIAGNOSIS — R1310 Dysphagia, unspecified: Secondary | ICD-10-CM | POA: Diagnosis not present

## 2013-07-26 DIAGNOSIS — I10 Essential (primary) hypertension: Secondary | ICD-10-CM | POA: Diagnosis not present

## 2013-07-26 DIAGNOSIS — E119 Type 2 diabetes mellitus without complications: Secondary | ICD-10-CM | POA: Diagnosis not present

## 2013-07-26 DIAGNOSIS — E785 Hyperlipidemia, unspecified: Secondary | ICD-10-CM | POA: Diagnosis not present

## 2013-07-26 DIAGNOSIS — M5104 Intervertebral disc disorders with myelopathy, thoracic region: Secondary | ICD-10-CM | POA: Diagnosis not present

## 2013-07-26 DIAGNOSIS — I6350 Cerebral infarction due to unspecified occlusion or stenosis of unspecified cerebral artery: Secondary | ICD-10-CM | POA: Diagnosis not present

## 2013-07-26 DIAGNOSIS — R51 Headache: Secondary | ICD-10-CM | POA: Diagnosis not present

## 2013-07-30 DIAGNOSIS — M546 Pain in thoracic spine: Secondary | ICD-10-CM | POA: Diagnosis not present

## 2013-08-04 DIAGNOSIS — M546 Pain in thoracic spine: Secondary | ICD-10-CM | POA: Diagnosis not present

## 2013-08-11 DIAGNOSIS — M546 Pain in thoracic spine: Secondary | ICD-10-CM | POA: Diagnosis not present

## 2013-08-12 DIAGNOSIS — I10 Essential (primary) hypertension: Secondary | ICD-10-CM | POA: Diagnosis not present

## 2013-08-12 DIAGNOSIS — R29898 Other symptoms and signs involving the musculoskeletal system: Secondary | ICD-10-CM | POA: Diagnosis not present

## 2013-08-12 DIAGNOSIS — R1310 Dysphagia, unspecified: Secondary | ICD-10-CM | POA: Diagnosis not present

## 2013-08-12 DIAGNOSIS — IMO0001 Reserved for inherently not codable concepts without codable children: Secondary | ICD-10-CM | POA: Diagnosis not present

## 2013-08-12 DIAGNOSIS — I771 Stricture of artery: Secondary | ICD-10-CM | POA: Diagnosis not present

## 2013-08-12 DIAGNOSIS — E119 Type 2 diabetes mellitus without complications: Secondary | ICD-10-CM | POA: Diagnosis not present

## 2013-08-12 DIAGNOSIS — I6350 Cerebral infarction due to unspecified occlusion or stenosis of unspecified cerebral artery: Secondary | ICD-10-CM | POA: Diagnosis not present

## 2013-08-12 DIAGNOSIS — E785 Hyperlipidemia, unspecified: Secondary | ICD-10-CM | POA: Diagnosis not present

## 2013-08-17 DIAGNOSIS — Z8673 Personal history of transient ischemic attack (TIA), and cerebral infarction without residual deficits: Secondary | ICD-10-CM | POA: Diagnosis not present

## 2013-08-19 DIAGNOSIS — M546 Pain in thoracic spine: Secondary | ICD-10-CM | POA: Diagnosis not present

## 2013-08-20 ENCOUNTER — Other Ambulatory Visit: Payer: Self-pay | Admitting: Cardiology

## 2013-08-20 DIAGNOSIS — Z8673 Personal history of transient ischemic attack (TIA), and cerebral infarction without residual deficits: Secondary | ICD-10-CM | POA: Diagnosis not present

## 2013-08-20 DIAGNOSIS — I672 Cerebral atherosclerosis: Secondary | ICD-10-CM | POA: Diagnosis not present

## 2013-08-20 DIAGNOSIS — I669 Occlusion and stenosis of unspecified cerebral artery: Secondary | ICD-10-CM | POA: Diagnosis not present

## 2013-08-28 DIAGNOSIS — E869 Volume depletion, unspecified: Secondary | ICD-10-CM | POA: Diagnosis not present

## 2013-08-28 DIAGNOSIS — R197 Diarrhea, unspecified: Secondary | ICD-10-CM | POA: Diagnosis not present

## 2013-08-29 DIAGNOSIS — R197 Diarrhea, unspecified: Secondary | ICD-10-CM | POA: Diagnosis not present

## 2013-09-20 ENCOUNTER — Other Ambulatory Visit: Payer: Self-pay | Admitting: Cardiology

## 2013-09-21 ENCOUNTER — Telehealth: Payer: Self-pay | Admitting: Cardiology

## 2013-09-21 NOTE — Telephone Encounter (Signed)
Walk In pt Form " [employer] letter Dropped off gave to [place]

## 2013-09-30 ENCOUNTER — Ambulatory Visit (INDEPENDENT_AMBULATORY_CARE_PROVIDER_SITE_OTHER): Payer: Medicare Other | Admitting: *Deleted

## 2013-09-30 DIAGNOSIS — I119 Hypertensive heart disease without heart failure: Secondary | ICD-10-CM

## 2013-09-30 DIAGNOSIS — E1165 Type 2 diabetes mellitus with hyperglycemia: Secondary | ICD-10-CM

## 2013-09-30 DIAGNOSIS — IMO0001 Reserved for inherently not codable concepts without codable children: Secondary | ICD-10-CM

## 2013-09-30 LAB — LIPID PANEL
CHOLESTEROL: 150 mg/dL (ref 0–200)
HDL: 42.3 mg/dL (ref >39.00)
LDL CALC: 89 mg/dL (ref 0–99)
TRIGLYCERIDES: 95 mg/dL (ref 0.0–149.0)
Total CHOL/HDL Ratio: 4
VLDL: 19 mg/dL (ref 0.0–40.0)

## 2013-09-30 LAB — BASIC METABOLIC PANEL
BUN: 14 mg/dL (ref 6–23)
CALCIUM: 9.4 mg/dL (ref 8.4–10.5)
CHLORIDE: 107 meq/L (ref 96–112)
CO2: 27 mEq/L (ref 19–32)
CREATININE: 0.6 mg/dL (ref 0.4–1.2)
GFR: 106.15 mL/min (ref >60.00)
Glucose, Bld: 170 mg/dL — ABNORMAL HIGH (ref 70–99)
Potassium: 3.6 mEq/L (ref 3.5–5.1)
Sodium: 140 mEq/L (ref 135–145)

## 2013-09-30 LAB — HEPATIC FUNCTION PANEL
ALBUMIN: 3.5 g/dL (ref 3.5–5.2)
ALK PHOS: 74 U/L (ref 39–117)
ALT: 15 U/L (ref 0–35)
AST: 15 U/L (ref 0–37)
Bilirubin, Direct: 0 mg/dL (ref 0.0–0.3)
TOTAL PROTEIN: 6.1 g/dL (ref 6.0–8.3)
Total Bilirubin: 0.7 mg/dL (ref 0.3–1.2)

## 2013-09-30 LAB — HEMOGLOBIN A1C: HEMOGLOBIN A1C: 8.6 % — AB (ref 4.6–6.5)

## 2013-09-30 NOTE — Progress Notes (Signed)
Quick Note:  Please make copy of labs for patient visit. ______ 

## 2013-10-04 ENCOUNTER — Encounter: Payer: Self-pay | Admitting: Cardiology

## 2013-10-04 ENCOUNTER — Ambulatory Visit (INDEPENDENT_AMBULATORY_CARE_PROVIDER_SITE_OTHER): Payer: Medicare Other | Admitting: Cardiology

## 2013-10-04 VITALS — BP 140/70 | HR 65 | Ht 64.5 in | Wt 190.0 lb

## 2013-10-04 DIAGNOSIS — I119 Hypertensive heart disease without heart failure: Secondary | ICD-10-CM

## 2013-10-04 DIAGNOSIS — E785 Hyperlipidemia, unspecified: Secondary | ICD-10-CM | POA: Diagnosis not present

## 2013-10-04 DIAGNOSIS — I6350 Cerebral infarction due to unspecified occlusion or stenosis of unspecified cerebral artery: Secondary | ICD-10-CM | POA: Diagnosis not present

## 2013-10-04 DIAGNOSIS — IMO0001 Reserved for inherently not codable concepts without codable children: Secondary | ICD-10-CM | POA: Diagnosis not present

## 2013-10-04 DIAGNOSIS — I639 Cerebral infarction, unspecified: Secondary | ICD-10-CM

## 2013-10-04 DIAGNOSIS — E1165 Type 2 diabetes mellitus with hyperglycemia: Secondary | ICD-10-CM

## 2013-10-04 NOTE — Assessment & Plan Note (Signed)
The patient has not been expressing any recent chest pain or angina.

## 2013-10-04 NOTE — Assessment & Plan Note (Signed)
She is not having any hypoglycemic episodes.

## 2013-10-04 NOTE — Patient Instructions (Signed)
Your physician recommends that you continue on your current medications as directed. Please refer to the Current Medication list given to you today.  Your physician wants you to follow-up in: 4 months with fasting labs (lp/bmet/hfp/a1c)  You will receive a reminder letter in the mail two months in advance. If you don't receive a letter, please call our office to schedule the follow-up appointment.  

## 2013-10-04 NOTE — Progress Notes (Signed)
Whitney Newton Date of Birth:  Oct 20, 1940 [address] [place], [place]  [address] [phone]         Fax   [phone]  History of Present Illness: This pleasant 73 year old woman is seen for a scheduled followup visit.  The patient has a history of having had a stroke in early December 2014.  She was at her diabetes clinic at [facility] and they diagnosed her and sent her straight to neurology where she was hospitalized for 3 days.  The MRI showed a new stroke on the right side.  She had carotid Dopplers which did not show any obstructive lesions but she did have some plaque and they put her back on aspirin and continued her Plavix.  The patient had a echocardiogram which was a bubble study and did not show any evidence of right-to-left shunt.  She was hospitalized for 3 days.  She will be getting home physical therapy.   She has a history of essential hypertension, diabetes mellitus, and dyslipidemia. She has had atypical chest pain. She is intolerant of statin drugs. We updated her lexiscan Myoview stress test on 03/17/12 and it was normal showing no evidence of ischemia and her ejection fraction was 78%. The patient has never had a cardiac catheterization. She has continued to have a lot of shortness of breath. She had an echocardiogram in 04/07/12 which showed an ejection fraction of 55-65% with grade 1 diastolic dysfunction. There is trivial aortic insufficiency.  The patient has a history of essential hypertension as well as atypical chest pain.  Her diabetes is now being followed at the [facility] clinic at [facility].  Her last chest x-ray was on 12/21/12 elsewhere and showed a normal heart size and low lung volumes.  She had a recent CT scan of the chest on 03/23/13 at [facility] which showed normal heart size and demonstrated atherosclerosis of the aorta. The patient has a past history of dermatofibrosarcoma of the spine.  She also has a past history of  angiolipoma of the abdominal wall. Since last visit the patient has been doing reasonably well.  She and her husband drove to [place] last month for a vacation.  She became sick in [place] with a GI virus and had to stop at a local emergency room for IV fluid resuscitation.  Current Outpatient Prescriptions  Medication Sig Dispense Refill  . aspirin 81 MG tablet Take 81 mg by mouth daily.      . bisoprolol-hydrochlorothiazide (ZIAC) 10-6.25 MG per tablet TAKE (1) TABLET DAILY AS DIRECTED.  30 tablet  5  . clopidogrel (PLAVIX) 75 MG tablet Take 1 tablet (75 mg total) by mouth daily.  30 tablet  6  . colesevelam (WELCHOL) 625 MG tablet Take 1,875 mg by mouth as needed.        . gabapentin (NEURONTIN) 100 MG capsule as needed.       . hydrochlorothiazide (HYDRODIURIL) 25 MG tablet Take 1/4 tablet daily  30 tablet  1  . hyoscyamine (LEVBID) 0.375 MG 12 hr tablet Take 0.375 mg by mouth every 12 (twelve) hours as needed.        . insulin glargine (LANTUS) 100 UNIT/ML injection Inject 46 Units into the skin daily.       Marland Kitchen levothyroxine (SYNTHROID, LEVOTHROID) 150 MCG tablet Take 150 mcg by mouth daily.        Marland Kitchen losartan (COZAAR) 25 MG tablet TAKE 1 TABLET ONCE DAILY.  30 tablet  3  . nitroGLYCERIN (NITROSTAT) 0.4 MG SL tablet DISSOLVE ONE TABLET UNDER TONGUE AS NEEDED FOR ARM/CHEST PAIN.  25 tablet  PRN  . Omeprazole-Sodium Bicarbonate (ZEGERID PO) Take 1 tablet by mouth daily.       . potassium chloride SA (K-DUR,KLOR-CON) 20 MEQ tablet TAKE ONE TABLET A DAY ON MONDAY, WEDNESDAY, AND FRIDAY, OR AS DIRECTED.  36 tablet  0   No current facility-administered medications for this visit.    Allergies  Allergen Reactions  . Actos [Pioglitazone Hydrochloride]     swelling  . Crestor [Rosuvastatin Calcium]     LEG PAIN  . Fenofibrate   . Imdur [Isosorbide]     headache  . Lipitor [Atorvastatin Calcium]     CHEST PAIN, SOB  . Metformin And Related   . Niaspan [Niacin Er]     RASH  . Pravachol   .  Ranolazine Er     EDEMA  . Zetia [Ezetimibe]     NO SLEEP, SOB    Patient Active Problem List   Diagnosis Date Noted  . Benign hypertensive heart disease without heart failure 09/18/2010    Priority: Medium  . Dyslipidemia 09/18/2010    Priority: Medium  . Hemangioma of liver 09/18/2010    Priority: Medium  . Atherosclerosis of aorta 04/12/2013  . 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 Chest pain 04/12/2013  . Skin rash 01/15/2013  . CVA (cerebral vascular accident) 07/31/2012  . Hypothyroidism 09/18/2010  . Type II or unspecified type diabetes mellitus without mention of complication, uncontrolled 09/18/2010  . Postmenopausal state 09/18/2010  . Chest pain 09/18/2010  . Dizziness 09/18/2010    History  Smoking status  . Never Smoker   Smokeless tobacco  . Not on file    History  Alcohol Use No    Family History  Problem Relation Age of Onset  . Heart disease Father   . Heart disease Brother     Review of Systems: Constitutional: no fever chills diaphoresis or fatigue or change in weight.  Head and neck: no hearing loss, no epistaxis, no photophobia or visual disturbance. Respiratory: No cough, shortness of breath or wheezing. Cardiovascular: No chest pain peripheral edema, palpitations. Gastrointestinal: No abdominal distention, no abdominal pain, no change in bowel habits hematochezia or melena. Genitourinary: No dysuria, no frequency, no urgency, no nocturia. Musculoskeletal:No arthralgias, no back pain, no gait disturbance or myalgias. Neurological: No dizziness, no headaches, no numbness, no seizures, no syncope, no weakness, no tremors. Hematologic: No lymphadenopathy, no easy bruising. Psychiatric: No confusion, no hallucinations, no sleep disturbance.    Physical Exam: Filed Vitals:   10/04/13 1436  BP: 140/70  Pulse: 65   the general appearance reveals a well-developed well-nourished woman in no  distress.The head and neck exam reveals pupils equal and reactive.  Extraocular movements are full.  There is no scleral icterus.  The mouth and pharynx are normal.  The neck is supple.  The carotids reveal no bruits.  The jugular venous pressure is normal.  The  thyroid is not enlarged.  There is no lymphadenopathy.  The chest is clear to percussion and auscultation.  There are no rales or rhonchi.  Expansion of the chest is symmetrical.  The precordium is quiet.  The first heart sound is normal.  The second heart sound is physiologically split.  There is no murmur gallop rub or click.  There is no abnormal lift or heave.  The abdomen is soft and nontender.  The bowel sounds are normal.  The liver and spleen are not enlarged.  There are no abdominal masses.  There are no abdominal bruits.  Extremities reveal good pedal pulses.  There is no phlebitis or edema.  There is no cyanosis or clubbing.  Strength has improved since last visit.    The skin is warm and dry.       Assessment / Plan: The patient is doing better.  She will continue same medication.  Recheck in 4 months for office visit lipid panel hepatic function panel basal metabolic panel and K5L. 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

## 2013-10-04 NOTE — Assessment & Plan Note (Signed)
The patient has not been having any headaches or severe dizzy spells.  Her blood pressure has been remaining stable.  No symptoms of CHF.

## 2013-10-04 NOTE — Assessment & Plan Note (Signed)
The patient has had no further TIA symptoms.  No new stroke since last visit.

## 2013-10-06 DIAGNOSIS — E119 Type 2 diabetes mellitus without complications: Secondary | ICD-10-CM | POA: Diagnosis not present

## 2013-10-06 DIAGNOSIS — E669 Obesity, unspecified: Secondary | ICD-10-CM | POA: Diagnosis not present

## 2013-10-06 DIAGNOSIS — K219 Gastro-esophageal reflux disease without esophagitis: Secondary | ICD-10-CM | POA: Diagnosis not present

## 2013-10-06 DIAGNOSIS — Z8673 Personal history of transient ischemic attack (TIA), and cerebral infarction without residual deficits: Secondary | ICD-10-CM | POA: Diagnosis not present

## 2013-10-06 DIAGNOSIS — R5381 Other malaise: Secondary | ICD-10-CM | POA: Diagnosis not present

## 2013-10-06 DIAGNOSIS — R0609 Other forms of dyspnea: Secondary | ICD-10-CM | POA: Diagnosis not present

## 2013-10-06 DIAGNOSIS — I10 Essential (primary) hypertension: Secondary | ICD-10-CM | POA: Diagnosis not present

## 2013-10-06 DIAGNOSIS — Z6832 Body mass index (BMI) 32.0-32.9, adult: Secondary | ICD-10-CM | POA: Diagnosis not present

## 2013-10-06 DIAGNOSIS — E785 Hyperlipidemia, unspecified: Secondary | ICD-10-CM | POA: Diagnosis not present

## 2013-10-06 DIAGNOSIS — R5383 Other fatigue: Secondary | ICD-10-CM | POA: Diagnosis not present

## 2013-10-06 DIAGNOSIS — E039 Hypothyroidism, unspecified: Secondary | ICD-10-CM | POA: Diagnosis not present

## 2013-10-06 DIAGNOSIS — G589 Mononeuropathy, unspecified: Secondary | ICD-10-CM | POA: Diagnosis not present

## 2013-10-06 DIAGNOSIS — E7800 Pure hypercholesterolemia, unspecified: Secondary | ICD-10-CM | POA: Diagnosis not present

## 2013-10-06 DIAGNOSIS — Z794 Long term (current) use of insulin: Secondary | ICD-10-CM | POA: Diagnosis not present

## 2013-10-06 DIAGNOSIS — G2581 Restless legs syndrome: Secondary | ICD-10-CM | POA: Diagnosis not present

## 2013-10-06 DIAGNOSIS — R0989 Other specified symptoms and signs involving the circulatory and respiratory systems: Secondary | ICD-10-CM | POA: Diagnosis not present

## 2013-10-11 DIAGNOSIS — E119 Type 2 diabetes mellitus without complications: Secondary | ICD-10-CM | POA: Diagnosis not present

## 2013-10-11 DIAGNOSIS — Z794 Long term (current) use of insulin: Secondary | ICD-10-CM | POA: Diagnosis not present

## 2013-10-19 DIAGNOSIS — Z794 Long term (current) use of insulin: Secondary | ICD-10-CM | POA: Diagnosis not present

## 2013-10-19 DIAGNOSIS — E785 Hyperlipidemia, unspecified: Secondary | ICD-10-CM | POA: Diagnosis not present

## 2013-10-19 DIAGNOSIS — E119 Type 2 diabetes mellitus without complications: Secondary | ICD-10-CM | POA: Diagnosis not present

## 2013-10-19 DIAGNOSIS — I10 Essential (primary) hypertension: Secondary | ICD-10-CM | POA: Diagnosis not present

## 2013-10-25 ENCOUNTER — Other Ambulatory Visit: Payer: Self-pay | Admitting: *Deleted

## 2013-10-25 MED ORDER — BISOPROLOL-HYDROCHLOROTHIAZIDE 10-6.25 MG PO TABS: ORAL_TABLET | ORAL | Status: DC

## 2013-10-31 DIAGNOSIS — R0989 Other specified symptoms and signs involving the circulatory and respiratory systems: Secondary | ICD-10-CM | POA: Diagnosis not present

## 2013-10-31 DIAGNOSIS — R5381 Other malaise: Secondary | ICD-10-CM | POA: Diagnosis not present

## 2013-10-31 DIAGNOSIS — R5383 Other fatigue: Secondary | ICD-10-CM | POA: Diagnosis not present

## 2013-10-31 DIAGNOSIS — G4733 Obstructive sleep apnea (adult) (pediatric): Secondary | ICD-10-CM | POA: Diagnosis not present

## 2013-10-31 DIAGNOSIS — R0609 Other forms of dyspnea: Secondary | ICD-10-CM | POA: Diagnosis not present

## 2013-10-31 DIAGNOSIS — G4730 Sleep apnea, unspecified: Secondary | ICD-10-CM | POA: Diagnosis not present

## 2013-11-25 DIAGNOSIS — G4733 Obstructive sleep apnea (adult) (pediatric): Secondary | ICD-10-CM | POA: Diagnosis not present

## 2013-11-26 DIAGNOSIS — G4733 Obstructive sleep apnea (adult) (pediatric): Secondary | ICD-10-CM | POA: Insufficient documentation

## 2013-11-29 ENCOUNTER — Other Ambulatory Visit: Payer: Self-pay | Admitting: Cardiology

## 2013-12-08 DIAGNOSIS — I672 Cerebral atherosclerosis: Secondary | ICD-10-CM | POA: Diagnosis not present

## 2013-12-08 DIAGNOSIS — IMO0002 Reserved for concepts with insufficient information to code with codable children: Secondary | ICD-10-CM | POA: Diagnosis not present

## 2013-12-08 DIAGNOSIS — I6350 Cerebral infarction due to unspecified occlusion or stenosis of unspecified cerebral artery: Secondary | ICD-10-CM | POA: Diagnosis not present

## 2013-12-15 DIAGNOSIS — H43819 Vitreous degeneration, unspecified eye: Secondary | ICD-10-CM | POA: Diagnosis not present

## 2013-12-15 DIAGNOSIS — H5310 Unspecified subjective visual disturbances: Secondary | ICD-10-CM | POA: Diagnosis not present

## 2013-12-15 DIAGNOSIS — H2640 Unspecified secondary cataract: Secondary | ICD-10-CM | POA: Diagnosis not present

## 2013-12-15 DIAGNOSIS — E119 Type 2 diabetes mellitus without complications: Secondary | ICD-10-CM | POA: Diagnosis not present

## 2013-12-16 DIAGNOSIS — H26499 Other secondary cataract, unspecified eye: Secondary | ICD-10-CM | POA: Diagnosis not present

## 2013-12-16 DIAGNOSIS — H2640 Unspecified secondary cataract: Secondary | ICD-10-CM | POA: Diagnosis not present

## 2013-12-21 DIAGNOSIS — K219 Gastro-esophageal reflux disease without esophagitis: Secondary | ICD-10-CM | POA: Diagnosis not present

## 2013-12-21 DIAGNOSIS — R1011 Right upper quadrant pain: Secondary | ICD-10-CM | POA: Diagnosis not present

## 2013-12-21 DIAGNOSIS — E039 Hypothyroidism, unspecified: Secondary | ICD-10-CM | POA: Diagnosis not present

## 2013-12-28 DIAGNOSIS — R1011 Right upper quadrant pain: Secondary | ICD-10-CM | POA: Diagnosis not present

## 2014-01-05 DIAGNOSIS — G2581 Restless legs syndrome: Secondary | ICD-10-CM | POA: Diagnosis not present

## 2014-01-05 DIAGNOSIS — G4733 Obstructive sleep apnea (adult) (pediatric): Secondary | ICD-10-CM | POA: Diagnosis not present

## 2014-01-18 DIAGNOSIS — C7A098 Malignant carcinoid tumors of other sites: Secondary | ICD-10-CM | POA: Diagnosis not present

## 2014-01-18 DIAGNOSIS — D1803 Hemangioma of intra-abdominal structures: Secondary | ICD-10-CM | POA: Diagnosis not present

## 2014-01-27 ENCOUNTER — Other Ambulatory Visit: Payer: Self-pay | Admitting: Cardiology

## 2014-02-04 ENCOUNTER — Other Ambulatory Visit (INDEPENDENT_AMBULATORY_CARE_PROVIDER_SITE_OTHER): Payer: Medicare Other

## 2014-02-04 DIAGNOSIS — E1165 Type 2 diabetes mellitus with hyperglycemia: Secondary | ICD-10-CM

## 2014-02-04 DIAGNOSIS — E785 Hyperlipidemia, unspecified: Secondary | ICD-10-CM | POA: Diagnosis not present

## 2014-02-04 DIAGNOSIS — IMO0001 Reserved for inherently not codable concepts without codable children: Secondary | ICD-10-CM | POA: Diagnosis not present

## 2014-02-04 DIAGNOSIS — I119 Hypertensive heart disease without heart failure: Secondary | ICD-10-CM

## 2014-02-04 LAB — LIPID PANEL
Cholesterol: 176 mg/dL (ref 0–200)
HDL: 50.4 mg/dL (ref >39.00)
LDL CALC: 108 mg/dL — AB (ref 0–99)
NONHDL: 125.6
Total CHOL/HDL Ratio: 3
Triglycerides: 89 mg/dL (ref 0.0–149.0)
VLDL: 17.8 mg/dL (ref 0.0–40.0)

## 2014-02-04 LAB — BASIC METABOLIC PANEL
BUN: 15 mg/dL (ref 6–23)
CO2: 27 meq/L (ref 19–32)
Calcium: 9.5 mg/dL (ref 8.4–10.5)
Chloride: 104 mEq/L (ref 96–112)
Creatinine, Ser: 0.7 mg/dL (ref 0.4–1.2)
GFR: 85.64 mL/min (ref >60.00)
GLUCOSE: 177 mg/dL — AB (ref 70–99)
POTASSIUM: 3.8 meq/L (ref 3.5–5.1)
Sodium: 138 mEq/L (ref 135–145)

## 2014-02-04 LAB — HEPATIC FUNCTION PANEL
ALT: 17 U/L (ref 0–35)
AST: 18 U/L (ref 0–37)
Albumin: 3.7 g/dL (ref 3.5–5.2)
Alkaline Phosphatase: 67 U/L (ref 39–117)
BILIRUBIN DIRECT: 0 mg/dL (ref 0.0–0.3)
BILIRUBIN TOTAL: 0.7 mg/dL (ref 0.2–1.2)
Total Protein: 6.6 g/dL (ref 6.0–8.3)

## 2014-02-04 LAB — HEMOGLOBIN A1C: Hgb A1c MFr Bld: 8.2 % — ABNORMAL HIGH (ref 4.6–6.5)

## 2014-02-04 NOTE — Progress Notes (Signed)
Quick Note:  Please make copy of labs for patient visit. ______ 

## 2014-02-07 ENCOUNTER — Ambulatory Visit (INDEPENDENT_AMBULATORY_CARE_PROVIDER_SITE_OTHER): Payer: Medicare Other | Admitting: Cardiology

## 2014-02-07 ENCOUNTER — Encounter: Payer: Self-pay | Admitting: Cardiology

## 2014-02-07 VITALS — BP 110/60 | HR 70 | Ht 64.0 in | Wt 189.0 lb

## 2014-02-07 DIAGNOSIS — I119 Hypertensive heart disease without heart failure: Secondary | ICD-10-CM | POA: Diagnosis not present

## 2014-02-07 DIAGNOSIS — IMO0001 Reserved for inherently not codable concepts without codable children: Secondary | ICD-10-CM | POA: Diagnosis not present

## 2014-02-07 DIAGNOSIS — E785 Hyperlipidemia, unspecified: Secondary | ICD-10-CM | POA: Diagnosis not present

## 2014-02-07 DIAGNOSIS — E1165 Type 2 diabetes mellitus with hyperglycemia: Secondary | ICD-10-CM

## 2014-02-07 DIAGNOSIS — I6350 Cerebral infarction due to unspecified occlusion or stenosis of unspecified cerebral artery: Secondary | ICD-10-CM

## 2014-02-07 DIAGNOSIS — I639 Cerebral infarction, unspecified: Secondary | ICD-10-CM

## 2014-02-07 DIAGNOSIS — R079 Chest pain, unspecified: Secondary | ICD-10-CM

## 2014-02-07 NOTE — Assessment & Plan Note (Signed)
The patient has had no further TIA symptoms.

## 2014-02-07 NOTE — Progress Notes (Signed)
Whitney Newton Date of Birth:  [birth year] [facility] [address] [place], [place]  [address] [phone]        Fax   [phone]   History of Present Illness: This pleasant 73 year old woman is seen for a scheduled followup visit. The patient has a history of having had a stroke in early December 2014. She was at her diabetes clinic at [facility] and they diagnosed her and sent her straight to neurology where she was hospitalized for 3 days. The MRI showed a new stroke on the right side. She had carotid Dopplers which did not show any obstructive lesions but she did have some plaque and they put her back on aspirin and continued her Plavix. The patient had a echocardiogram which was a bubble study and did not show any evidence of right-to-left shunt.   She has a history of essential hypertension, diabetes mellitus, and dyslipidemia. She has had atypical chest pain. She is intolerant of statin drugs. We updated her lexiscan Myoview stress test on 03/17/12 and it was normal showing no evidence of ischemia and her ejection fraction was 78%. The patient has never had a cardiac catheterization. She has continued to have a lot of shortness of breath. She had an echocardiogram in 04/07/12 which showed an ejection fraction of 55-65% with grade 1 diastolic dysfunction. There is trivial aortic insufficiency.  The patient has a history of essential hypertension as well as atypical chest pain. Her diabetes is now being followed at the [facility] clinic at [facility]. Her last chest x-ray was on 12/21/12 elsewhere and showed a normal heart size and low lung volumes. She had a recent CT scan of the chest on 03/23/13 at [facility] which showed normal heart size and demonstrated atherosclerosis of the aorta.  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    Current Outpatient  Prescriptions  Medication Sig Dispense Refill  . aspirin 81 MG tablet Take 81 mg by mouth daily.      . bisoprolol-hydrochlorothiazide (ZIAC) 10-6.25 MG per tablet TAKE (1) TABLET DAILY AS DIRECTED.  30 tablet  5  . clopidogrel (PLAVIX) 75 MG tablet TAKE 1 TABLET DAILY.  30 tablet  6  . colesevelam (WELCHOL) 625 MG tablet Take 1,875 mg by mouth as needed.        . gabapentin (NEURONTIN) 100 MG capsule as needed.       Marland Kitchen glucose blood ([employer] BLOOD GLUCOSE TEST STRIPS) test strip Used to check Blood Sugar 4 times daily. Dx. Code 250.00      . hydrochlorothiazide (HYDRODIURIL) 25 MG tablet TAKE 1/4 TABLET ONCE A DAY.  30 tablet  3  . hyoscyamine (LEVBID) 0.375 MG 12 hr tablet Take 0.375 mg by mouth every 12 (twelve) hours as needed.        . insulin aspart (NOVOLOG FLEXPEN) 100 UNIT/ML FlexPen Inject under skin before each meal and hold if skips meal.   AM: 4 units, NOON: 4 units, PM: 6 units      . insulin glargine (LANTUS) 100 UNIT/ML injection Inject 46 Units into the skin daily.       . Lancets (ACCU-CHEK MULTICLIX) lancets Used to check Blood Sugar 4 times daily. Dx. Code 250.00.      Marland Kitchen levothyroxine (SYNTHROID, LEVOTHROID) 150 MCG tablet Take 150 mcg by mouth daily.        Marland Kitchen  losartan (COZAAR) 25 MG tablet TAKE 1 TABLET ONCE DAILY.  30 tablet  6  . nitroGLYCERIN (NITROSTAT) 0.4 MG SL tablet DISSOLVE ONE TABLET UNDER TONGUE AS NEEDED FOR ARM/CHEST PAIN.  25 tablet  PRN  . Omeprazole-Sodium Bicarbonate (ZEGERID PO) Take 1 tablet by mouth daily.       . potassium chloride SA (K-DUR,KLOR-CON) 20 MEQ tablet TAKE ONE TABLET A DAY ON MONDAY, WEDNESDAY, AND FRIDAY, OR AS DIRECTED.  36 tablet  1  . rOPINIRole (REQUIP) 0.25 MG tablet 1 tab an hour before bedtime       No current facility-administered medications for this visit.    Allergies  Allergen Reactions  . Celecoxib Shortness Of Breath  . Actos [Pioglitazone Hydrochloride]     swelling  . Crestor [Rosuvastatin Calcium]     LEG PAIN  .  Doxycycline     gastritis  . Fenofibrate   . Imdur [Isosorbide]     headache  . Lipitor [Atorvastatin Calcium]     CHEST PAIN, SOB  . Metformin And Related   . Niaspan [Niacin Er]     RASH  . Pioglitazone Swelling  . Pravachol   . Ranolazine Er     EDEMA  . Rosuvastatin Other (See Comments)    LEG PAIN  . Zetia [Ezetimibe]     NO SLEEP, SOB    Patient Active Problem List   Diagnosis Date Noted  . Benign hypertensive heart disease without heart failure 09/18/2010    Priority: Medium  . Dyslipidemia 09/18/2010    Priority: Medium  . Hemangioma of liver 09/18/2010    Priority: Medium  . Atherosclerosis of aorta 04/12/2013  . 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 Chest pain 04/12/2013  . Skin rash 01/15/2013  . CVA (cerebral vascular accident) 07/31/2012  . Hypothyroidism 09/18/2010  . Type II or unspecified type diabetes mellitus without mention of complication, uncontrolled 09/18/2010  . Postmenopausal state 09/18/2010  . Chest pain 09/18/2010  . Dizziness 09/18/2010    History  Smoking status  . Never Smoker   Smokeless tobacco  . Not on file    History  Alcohol Use No    Family History  Problem Relation Age of Onset  . Heart disease Father   . Heart disease Brother     Review of Systems: Constitutional: no fever chills diaphoresis or fatigue or change in weight.  Head and neck: no hearing loss, no epistaxis, no photophobia or visual disturbance. Respiratory: No cough, shortness of breath or wheezing. Cardiovascular: No chest pain peripheral edema, palpitations. Gastrointestinal: No abdominal distention, no abdominal pain, no change in bowel habits hematochezia or melena. Genitourinary: No dysuria, no frequency, no urgency, no nocturia. Musculoskeletal:No arthralgias, no back pain, no gait disturbance or myalgias. Neurological: No dizziness, no headaches, no numbness, no seizures, no syncope, no weakness, no  tremors. Hematologic: No lymphadenopathy, no easy bruising. Psychiatric: No confusion, no hallucinations, no sleep disturbance.    Physical Exam: Filed Vitals:   02/07/14 1403  BP: 110/60  Pulse: 70  The patient appears to be in no distress.  Head and neck exam reveals that the pupils are equal and reactive.  The extraocular movements are full.  There is no scleral icterus.  Mouth and pharynx are benign.  No lymphadenopathy.  No carotid bruits.  The jugular venous pressure is normal.  Thyroid is not enlarged or tender.  Chest is clear to percussion and auscultation.  No rales or rhonchi.  Expansion of the  chest is symmetrical.  Heart reveals no abnormal lift or heave.  First and second heart sounds are normal.  There is no murmur gallop rub or click.  The abdomen is soft and nontender.  Bowel sounds are normoactive.  There is no hepatosplenomegaly or mass.  There are no abdominal bruits.  Extremities reveal no phlebitis or edema.  Pedal pulses are good.  There is no cyanosis or clubbing.  Neurologic exam is normal strength and no lateralizing weakness.  No sensory deficits.  Integument reveals no rash    Assessment / Plan: 1.  Hypertensive heart disease without heart failure 2. diabetes mellitus 3.  Atypical chest pain with normal Myoview stress test in October 2013. 31. old CVA followed at [facility] 5.  History of dermatofibro- sarcoma of the spine. 6.  Dyslipidemia 7. Osteoarthritis 8.OSA on CPAP [facility])  Disposition: Continue same medication.  He is to take her WelChol more faithfully. He reports that she got a shingles vaccine from Dr. Alroy Dust since we last saw her. Recheck here in 4 months for office visit EKG lipid panel hepatic function panel basal metabolic and Y8M

## 2014-02-07 NOTE — Patient Instructions (Signed)
Your physician recommends that you continue on your current medications as directed. Please refer to the Current Medication list given to you today.  Your physician wants you to follow-up in: 4 months with fasting labs (lp/bmet/hfp) You will receive a reminder letter in the mail two months in advance. If you don't receive a letter, please call our office to schedule the follow-up appointment.  

## 2014-02-07 NOTE — Assessment & Plan Note (Signed)
Blood pressure has been remaining stable on current therapy.  No symptoms of CHF.

## 2014-02-07 NOTE — Assessment & Plan Note (Signed)
The patient is not having any adverse effects from her WelChol but she frequently forgets to take it

## 2014-02-23 DIAGNOSIS — G4733 Obstructive sleep apnea (adult) (pediatric): Secondary | ICD-10-CM | POA: Diagnosis not present

## 2014-03-10 DIAGNOSIS — E780 Pure hypercholesterolemia: Secondary | ICD-10-CM | POA: Diagnosis not present

## 2014-03-10 DIAGNOSIS — W57XXXA Bitten or stung by nonvenomous insect and other nonvenomous arthropods, initial encounter: Secondary | ICD-10-CM | POA: Diagnosis not present

## 2014-03-10 DIAGNOSIS — I10 Essential (primary) hypertension: Secondary | ICD-10-CM | POA: Diagnosis not present

## 2014-03-10 DIAGNOSIS — K219 Gastro-esophageal reflux disease without esophagitis: Secondary | ICD-10-CM | POA: Diagnosis not present

## 2014-03-10 DIAGNOSIS — Z23 Encounter for immunization: Secondary | ICD-10-CM | POA: Diagnosis not present

## 2014-03-10 DIAGNOSIS — L821 Other seborrheic keratosis: Secondary | ICD-10-CM | POA: Diagnosis not present

## 2014-03-10 DIAGNOSIS — E119 Type 2 diabetes mellitus without complications: Secondary | ICD-10-CM | POA: Diagnosis not present

## 2014-03-10 DIAGNOSIS — Z8673 Personal history of transient ischemic attack (TIA), and cerebral infarction without residual deficits: Secondary | ICD-10-CM | POA: Diagnosis not present

## 2014-03-10 DIAGNOSIS — E1165 Type 2 diabetes mellitus with hyperglycemia: Secondary | ICD-10-CM | POA: Diagnosis not present

## 2014-03-10 DIAGNOSIS — E039 Hypothyroidism, unspecified: Secondary | ICD-10-CM | POA: Diagnosis not present

## 2014-03-10 DIAGNOSIS — Z794 Long term (current) use of insulin: Secondary | ICD-10-CM | POA: Diagnosis not present

## 2014-03-10 DIAGNOSIS — L439 Lichen planus, unspecified: Secondary | ICD-10-CM | POA: Diagnosis not present

## 2014-03-10 DIAGNOSIS — T148 Other injury of unspecified body region: Secondary | ICD-10-CM | POA: Diagnosis not present

## 2014-04-25 ENCOUNTER — Other Ambulatory Visit: Payer: Self-pay | Admitting: Cardiology

## 2014-05-03 DIAGNOSIS — Z1231 Encounter for screening mammogram for malignant neoplasm of breast: Secondary | ICD-10-CM | POA: Diagnosis not present

## 2014-05-03 DIAGNOSIS — Z01419 Encounter for gynecological examination (general) (routine) without abnormal findings: Secondary | ICD-10-CM | POA: Diagnosis not present

## 2014-05-24 ENCOUNTER — Other Ambulatory Visit: Payer: Self-pay | Admitting: Cardiology

## 2014-06-20 DIAGNOSIS — M5104 Intervertebral disc disorders with myelopathy, thoracic region: Secondary | ICD-10-CM | POA: Diagnosis not present

## 2014-06-20 DIAGNOSIS — G43109 Migraine with aura, not intractable, without status migrainosus: Secondary | ICD-10-CM | POA: Diagnosis not present

## 2014-06-20 DIAGNOSIS — I6601 Occlusion and stenosis of right middle cerebral artery: Secondary | ICD-10-CM | POA: Diagnosis not present

## 2014-06-20 DIAGNOSIS — E039 Hypothyroidism, unspecified: Secondary | ICD-10-CM | POA: Diagnosis not present

## 2014-06-20 DIAGNOSIS — E785 Hyperlipidemia, unspecified: Secondary | ICD-10-CM | POA: Diagnosis not present

## 2014-06-20 DIAGNOSIS — I10 Essential (primary) hypertension: Secondary | ICD-10-CM | POA: Diagnosis not present

## 2014-06-20 DIAGNOSIS — E119 Type 2 diabetes mellitus without complications: Secondary | ICD-10-CM | POA: Diagnosis not present

## 2014-06-23 DIAGNOSIS — Z881 Allergy status to other antibiotic agents status: Secondary | ICD-10-CM | POA: Diagnosis not present

## 2014-06-23 DIAGNOSIS — M5104 Intervertebral disc disorders with myelopathy, thoracic region: Secondary | ICD-10-CM | POA: Diagnosis not present

## 2014-06-23 DIAGNOSIS — Z85828 Personal history of other malignant neoplasm of skin: Secondary | ICD-10-CM | POA: Diagnosis not present

## 2014-06-23 DIAGNOSIS — G43109 Migraine with aura, not intractable, without status migrainosus: Secondary | ICD-10-CM | POA: Diagnosis not present

## 2014-06-23 DIAGNOSIS — Z888 Allergy status to other drugs, medicaments and biological substances status: Secondary | ICD-10-CM | POA: Diagnosis not present

## 2014-06-23 DIAGNOSIS — I10 Essential (primary) hypertension: Secondary | ICD-10-CM | POA: Diagnosis not present

## 2014-06-23 DIAGNOSIS — Z8673 Personal history of transient ischemic attack (TIA), and cerebral infarction without residual deficits: Secondary | ICD-10-CM | POA: Diagnosis not present

## 2014-06-23 DIAGNOSIS — E785 Hyperlipidemia, unspecified: Secondary | ICD-10-CM | POA: Diagnosis not present

## 2014-06-23 DIAGNOSIS — G4733 Obstructive sleep apnea (adult) (pediatric): Secondary | ICD-10-CM | POA: Diagnosis not present

## 2014-06-23 DIAGNOSIS — E039 Hypothyroidism, unspecified: Secondary | ICD-10-CM | POA: Diagnosis not present

## 2014-06-23 DIAGNOSIS — E119 Type 2 diabetes mellitus without complications: Secondary | ICD-10-CM | POA: Diagnosis not present

## 2014-06-23 DIAGNOSIS — I6601 Occlusion and stenosis of right middle cerebral artery: Secondary | ICD-10-CM | POA: Diagnosis not present

## 2014-07-13 DIAGNOSIS — M5104 Intervertebral disc disorders with myelopathy, thoracic region: Secondary | ICD-10-CM | POA: Diagnosis not present

## 2014-07-20 DIAGNOSIS — E1165 Type 2 diabetes mellitus with hyperglycemia: Secondary | ICD-10-CM | POA: Diagnosis not present

## 2014-07-20 DIAGNOSIS — I10 Essential (primary) hypertension: Secondary | ICD-10-CM | POA: Diagnosis not present

## 2014-07-20 DIAGNOSIS — Z794 Long term (current) use of insulin: Secondary | ICD-10-CM | POA: Diagnosis not present

## 2014-07-20 DIAGNOSIS — E785 Hyperlipidemia, unspecified: Secondary | ICD-10-CM | POA: Diagnosis not present

## 2014-07-21 ENCOUNTER — Telehealth: Payer: Self-pay | Admitting: Cardiology

## 2014-07-21 NOTE — Telephone Encounter (Signed)
Follow up   Pt had A1c result 7.5 done  2/10 in [place].  Pt would like to do lab day before seeing Dr Mare Ferrari please put in orders give a call to schedule.

## 2014-07-21 NOTE — Telephone Encounter (Signed)
Scheduled lp/bmet/hfp for patient prior to ov

## 2014-07-26 DIAGNOSIS — D225 Melanocytic nevi of trunk: Secondary | ICD-10-CM | POA: Diagnosis not present

## 2014-07-26 DIAGNOSIS — D2271 Melanocytic nevi of right lower limb, including hip: Secondary | ICD-10-CM | POA: Diagnosis not present

## 2014-07-26 DIAGNOSIS — Z23 Encounter for immunization: Secondary | ICD-10-CM | POA: Diagnosis not present

## 2014-07-26 DIAGNOSIS — E039 Hypothyroidism, unspecified: Secondary | ICD-10-CM | POA: Diagnosis not present

## 2014-07-26 DIAGNOSIS — L821 Other seborrheic keratosis: Secondary | ICD-10-CM | POA: Diagnosis not present

## 2014-07-26 DIAGNOSIS — D2272 Melanocytic nevi of left lower limb, including hip: Secondary | ICD-10-CM | POA: Diagnosis not present

## 2014-07-27 DIAGNOSIS — Z961 Presence of intraocular lens: Secondary | ICD-10-CM | POA: Diagnosis not present

## 2014-07-27 DIAGNOSIS — E119 Type 2 diabetes mellitus without complications: Secondary | ICD-10-CM | POA: Diagnosis not present

## 2014-07-27 LAB — HM DIABETES EYE EXAM

## 2014-07-29 ENCOUNTER — Other Ambulatory Visit (INDEPENDENT_AMBULATORY_CARE_PROVIDER_SITE_OTHER): Payer: Medicare Other | Admitting: *Deleted

## 2014-07-29 DIAGNOSIS — I119 Hypertensive heart disease without heart failure: Secondary | ICD-10-CM

## 2014-07-29 DIAGNOSIS — E785 Hyperlipidemia, unspecified: Secondary | ICD-10-CM | POA: Diagnosis not present

## 2014-07-29 LAB — LIPID PANEL
Cholesterol: 199 mg/dL (ref 0–200)
HDL: 50.7 mg/dL (ref >39.00)
LDL Cholesterol: 120 mg/dL — ABNORMAL HIGH (ref 0–99)
NONHDL: 148.3
Total CHOL/HDL Ratio: 4
Triglycerides: 140 mg/dL (ref 0.0–149.0)
VLDL: 28 mg/dL (ref 0.0–40.0)

## 2014-07-29 LAB — HEPATIC FUNCTION PANEL
ALT: 15 U/L (ref 0–35)
AST: 14 U/L (ref 0–37)
Albumin: 3.7 g/dL (ref 3.5–5.2)
Alkaline Phosphatase: 75 U/L (ref 39–117)
BILIRUBIN TOTAL: 0.8 mg/dL (ref 0.2–1.2)
Bilirubin, Direct: 0.1 mg/dL (ref 0.0–0.3)
Total Protein: 6.3 g/dL (ref 6.0–8.3)

## 2014-07-29 LAB — BASIC METABOLIC PANEL
BUN: 20 mg/dL (ref 6–23)
CO2: 28 mEq/L (ref 19–32)
CREATININE: 0.72 mg/dL (ref 0.40–1.20)
Calcium: 9.3 mg/dL (ref 8.4–10.5)
Chloride: 106 mEq/L (ref 96–112)
GFR: 84.16 mL/min (ref >60.00)
GLUCOSE: 168 mg/dL — AB (ref 70–99)
Potassium: 3.9 mEq/L (ref 3.5–5.1)
Sodium: 140 mEq/L (ref 135–145)

## 2014-07-29 NOTE — Progress Notes (Signed)
Quick Note:  Please make copy of labs for patient visit. ______ 

## 2014-08-02 ENCOUNTER — Encounter: Payer: Self-pay | Admitting: Cardiology

## 2014-08-02 ENCOUNTER — Ambulatory Visit (INDEPENDENT_AMBULATORY_CARE_PROVIDER_SITE_OTHER): Payer: Medicare Other | Admitting: Cardiology

## 2014-08-02 VITALS — BP 130/78 | HR 67 | Ht 64.0 in | Wt 195.8 lb

## 2014-08-02 DIAGNOSIS — I4510 Unspecified right bundle-branch block: Secondary | ICD-10-CM

## 2014-08-02 DIAGNOSIS — I639 Cerebral infarction, unspecified: Secondary | ICD-10-CM

## 2014-08-02 DIAGNOSIS — E785 Hyperlipidemia, unspecified: Secondary | ICD-10-CM | POA: Diagnosis not present

## 2014-08-02 DIAGNOSIS — I119 Hypertensive heart disease without heart failure: Secondary | ICD-10-CM

## 2014-08-02 DIAGNOSIS — R079 Chest pain, unspecified: Secondary | ICD-10-CM

## 2014-08-02 MED ORDER — NITROGLYCERIN 0.4 MG SL SUBL: SUBLINGUAL_TABLET | SUBLINGUAL | Status: DC

## 2014-08-02 MED ORDER — FUROSEMIDE 40 MG PO TABS: 40.0000 mg | ORAL_TABLET | Freq: Every day | ORAL | Status: DC | PRN

## 2014-08-02 NOTE — Progress Notes (Signed)
Cardiology Office Note   Date:  08/02/2014   ID:  Whitney Newton, Whitney Newton August 11, 1940, MRN [id number]  PCP:  Donnie Coffin, MD  Cardiologist:    Darlin Coco, MD   No chief complaint on file.     History of Present Illness: Whitney Newton is a 74 y.o. female who presents for scheduled follow-up office visit  This pleasant 74 year old woman is seen for a scheduled followup visit. The patient has a history of having had a stroke in early December 2014. She was at her diabetes clinic at [facility] and they diagnosed her and sent her straight to neurology where she was hospitalized for 3 days. The MRI showed a new stroke on the right side. She had carotid Dopplers which did not show any obstructive lesions but she did have some plaque and they put her back on aspirin and continued her Plavix. The patient had a echocardiogram which was a bubble study and did not show any evidence of right-to-left shunt. More recently, she has had further neurology workup at [facility] on 06/20/14.  She had a transcranial Doppler.  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  Previous carotid Dopplers had not shown any significant external cranial disease She has a history of essential hypertension, diabetes mellitus, and dyslipidemia. She has had atypical chest pain. She is intolerant of statin drugs. We updated her lexiscan Myoview stress test on 03/17/12 and it was normal showing no evidence of ischemia and her ejection fraction was 78%. The patient has never had a cardiac catheterization. She has continued to have some shortness of breath. She had an echocardiogram in 04/07/12 which showed an ejection fraction of 55-65% with grade 1 diastolic dysfunction. There is trivial aortic insufficiency.  The patient has a history of essential hypertension as well as atypical  chest pain. Her diabetes is now being followed at the [facility] clinic at [facility].  Since we last saw her she had an ophthalmology evaluation on 07/27/14 by Dr. Kathrin Penner and no diabetic retinopathy was found.  Her last chest x-ray was on 12/21/12 elsewhere and showed a normal heart size and low lung volumes. She had a recent CT scan of the chest on 03/23/13 at [facility] which showed normal heart size and demonstrated atherosclerosis of the aorta.  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  Since last visit she has not been experiencing any new cardiac symptoms.  She does have sleep apnea.  She uses a CPAP machine.  She has had some atypical chest discomfort more suggestive of dyspepsia.  She had run out of fresh nitroglycerin and we have called her in a new supply today.  He has variable ankle edema and we called her in a new prescription for Lasix as well.  Past Medical History  Diagnosis Date  . Hypertension   . Thyroid disease     hypothyroidism  . Gastritis   . Diverticulitis   . Back pain     prior back surgery in 2009 - slow to recover  . Diabetes   . HLD (hyperlipidemia)     statin intolerant  . Normal cardiac stress test 2011  . Obesity   . Hemangioma of liver     managed conservatively; [facility] at [facility]  Past Surgical History  Procedure Laterality Date  . Abdominal hysterectomy    . Nasal reconstruction    . Appendectomy       Current Outpatient Prescriptions  Medication Sig Dispense Refill  . aspirin 81 MG tablet Take 81 mg by mouth daily.    . bisoprolol-hydrochlorothiazide (ZIAC) 10-6.25 MG per tablet TAKE (1) TABLET DAILY AS DIRECTED. 30 tablet 3  . clopidogrel (PLAVIX) 75 MG tablet TAKE 1 TABLET DAILY. 30 tablet 6  . colesevelam (WELCHOL) 625 MG tablet Take 1,875 mg by mouth as needed.      . gabapentin (NEURONTIN) 100 MG capsule as needed.     Marland Kitchen glucose  blood ([employer] BLOOD GLUCOSE TEST STRIPS) test strip Used to check Blood Sugar 4 times daily. Dx. Code 250.00    . hydrochlorothiazide (HYDRODIURIL) 25 MG tablet TAKE 1/4 TABLET ONCE A DAY. 30 tablet 3  . hyoscyamine (LEVBID) 0.375 MG 12 hr tablet Take 0.375 mg by mouth every 12 (twelve) hours as needed.      . insulin aspart (NOVOLOG FLEXPEN) 100 UNIT/ML FlexPen Inject under skin before each meal and hold if skips meal.   AM: 4 units, NOON: 4 units, PM: 6 units    . insulin glargine (LANTUS) 100 UNIT/ML injection Inject 46 Units into the skin daily.     . Lancets (ACCU-CHEK MULTICLIX) lancets Used to check Blood Sugar 4 times daily. Dx. Code 250.00.    Marland Kitchen levothyroxine (SYNTHROID, LEVOTHROID) 150 MCG tablet Take 125 mcg by mouth daily.     Marland Kitchen losartan (COZAAR) 25 MG tablet TAKE 1 TABLET ONCE DAILY. 30 tablet 6  . nitroGLYCERIN (NITROSTAT) 0.4 MG SL tablet DISSOLVE ONE TABLET UNDER TONGUE AS NEEDED FOR ARM/CHEST PAIN. 25 tablet PRN  . Omeprazole-Sodium Bicarbonate (ZEGERID PO) Take 1 tablet by mouth daily.     . potassium chloride SA (K-DUR,KLOR-CON) 20 MEQ tablet TAKE ONE TABLET A DAY ON MONDAY, WEDNESDAY, AND FRIDAY, OR AS DIRECTED. 36 tablet 2  . furosemide (LASIX) 40 MG tablet Take 1 tablet (40 mg total) by mouth daily as needed for fluid. 30 tablet 3   No current facility-administered medications for this visit.    Allergies:   Celecoxib; Ropinirole hcl; Actos; Crestor; Doxycycline; Fenofibrate; Imdur; Lipitor; Metformin and related; Niaspan; Pioglitazone; Pravachol; Ranolazine er; Rosuvastatin; and Zetia    Social History:  The patient  reports that she has never smoked. She does not have any smokeless tobacco history on file. She reports that she does not drink alcohol or use illicit drugs.   Family History:  The patient's  family history includes Heart disease in her brother and father.    ROS:  Please see the history of present illness.   Otherwise, review of systems are positive for  none.   All other systems are reviewed and negative.    PHYSICAL EXAM: VS:  BP 130/78 mmHg  Pulse 67  Ht 5\' 4"  (1.626 m)  Wt 195 lb 12.8 oz (88.814 kg)  BMI 33.59 kg/m2 , BMI Body mass index is 33.59 kg/(m^2). GEN: Well nourished, well developed, in no acute distress HEENT: normal Neck: no JVD, carotid bruits, or masses Cardiac: RRR; no murmurs, rubs, or gallops,no edema  Respiratory:  clear to auscultation bilaterally, normal work of breathing GI: soft, nontender, nondistended, + BS MS: no deformity or atrophy Skin: warm and dry, no rash Neuro:  Strength and sensation are intact Psych: euthymic mood, full affect   EKG:  EKG is ordered today. The ekg ordered  today demonstrates normal sinus rhythm with right bundle branch block.  Since previous EKG of 05/17/13, no significant change   Recent Labs: 07/29/2014: ALT 15; BUN 20; Creatinine 0.72; Potassium 3.9; Sodium 140    Lipid Panel    Component Value Date/Time   CHOL 199 07/29/2014 0935   TRIG 140.0 07/29/2014 0935   HDL 50.70 07/29/2014 0935   CHOLHDL 4 07/29/2014 0935   VLDL 28.0 07/29/2014 0935   LDLCALC 120* 07/29/2014 0935   LDLDIRECT 148.7 09/14/2012 0857      Wt Readings from Last 3 Encounters:  08/02/14 195 lb 12.8 oz (88.814 kg)  02/07/14 189 lb (85.73 kg)  10/04/13 190 lb (86.183 kg)         ASSESSMENT AND PLAN:  1. Hypertensive heart disease without heart failure 2. diabetes mellitus 3. Atypical chest pain with normal Myoview stress test in October 2013. 3. old CVA followed at [facility] 5. History of dermatofibro- sarcoma of the spine. 6. Dyslipidemia 7. Osteoarthritis 8. OSA on CPAP [facility])  Disposition: Continue same medication. Continue on WelChol. He reports that she got a shingles vaccine from Dr. Alroy Dust since we last saw her. Recheck here in 4 months for office visit EKG lipid panel hepatic function panel basal metabolic.  Her A1c's are followed at the [facility]  clinic   Current medicines are reviewed at length with the patient today.  The patient does not have concerns regarding medicines.  The following changes have been made:  no change    Orders Placed This Encounter  Procedures  . Hepatic function panel  . Basic metabolic panel  . Lipid panel  . EKG 12-Lead     Disposition:   FU with Dr. Mare Ferrari in 4 months for office visit lipid panel hepatic function panel and basal metabolic panel.  Work harder on diet and weight loss.  Try to increase exercise.   Signed, Darlin Coco, MD  08/02/2014 11:48 AM    [facility] [place], [place], [place]  [address] Phone: [phone]; Fax: [phone]

## 2014-08-02 NOTE — Patient Instructions (Addendum)
Your physician recommends that you continue on your current medications as directed. Please refer to the Current Medication list given to you today.  Your physician recommends that you schedule a follow-up appointment in: 4 months with fasting labs (lp/bmet/hfp)  

## 2014-08-10 ENCOUNTER — Encounter: Payer: Self-pay | Admitting: Cardiology

## 2014-08-24 ENCOUNTER — Other Ambulatory Visit: Payer: Self-pay | Admitting: Cardiology

## 2014-08-25 ENCOUNTER — Other Ambulatory Visit: Payer: Self-pay | Admitting: Cardiology

## 2014-09-13 DIAGNOSIS — M545 Low back pain: Secondary | ICD-10-CM | POA: Diagnosis not present

## 2014-09-15 ENCOUNTER — Other Ambulatory Visit: Payer: Self-pay | Admitting: Orthopaedic Surgery

## 2014-09-15 DIAGNOSIS — M545 Low back pain: Secondary | ICD-10-CM

## 2014-09-20 DIAGNOSIS — D1803 Hemangioma of intra-abdominal structures: Secondary | ICD-10-CM | POA: Diagnosis not present

## 2014-09-20 DIAGNOSIS — E278 Other specified disorders of adrenal gland: Secondary | ICD-10-CM | POA: Diagnosis not present

## 2014-09-20 DIAGNOSIS — E038 Other specified hypothyroidism: Secondary | ICD-10-CM | POA: Diagnosis not present

## 2014-09-20 DIAGNOSIS — C7A8 Other malignant neuroendocrine tumors: Secondary | ICD-10-CM | POA: Diagnosis not present

## 2014-09-20 DIAGNOSIS — M5135 Other intervertebral disc degeneration, thoracolumbar region: Secondary | ICD-10-CM | POA: Diagnosis not present

## 2014-10-07 ENCOUNTER — Ambulatory Visit
Admission: RE | Admit: 2014-10-07 | Discharge: 2014-10-07 | Disposition: A | Discharge status: Home / Self Care | Payer: Medicare Other | Source: Ambulatory Visit | Attending: Orthopaedic Surgery | Admitting: Orthopaedic Surgery

## 2014-10-07 DIAGNOSIS — M5126 Other intervertebral disc displacement, lumbar region: Secondary | ICD-10-CM | POA: Diagnosis not present

## 2014-10-07 DIAGNOSIS — M545 Low back pain: Secondary | ICD-10-CM

## 2014-10-07 DIAGNOSIS — M47817 Spondylosis without myelopathy or radiculopathy, lumbosacral region: Secondary | ICD-10-CM | POA: Diagnosis not present

## 2014-10-07 MED ORDER — GADOBENATE DIMEGLUMINE 529 MG/ML IV SOLN: 17.0000 mL | Freq: Once | INTRAVENOUS | Status: AC | PRN

## 2014-10-10 DIAGNOSIS — M5416 Radiculopathy, lumbar region: Secondary | ICD-10-CM | POA: Diagnosis not present

## 2014-11-21 DIAGNOSIS — E039 Hypothyroidism, unspecified: Secondary | ICD-10-CM | POA: Diagnosis not present

## 2014-11-21 DIAGNOSIS — Z79899 Other long term (current) drug therapy: Secondary | ICD-10-CM | POA: Diagnosis not present

## 2014-11-21 DIAGNOSIS — E785 Hyperlipidemia, unspecified: Secondary | ICD-10-CM | POA: Diagnosis not present

## 2014-11-21 DIAGNOSIS — I10 Essential (primary) hypertension: Secondary | ICD-10-CM | POA: Diagnosis not present

## 2014-11-21 DIAGNOSIS — E1165 Type 2 diabetes mellitus with hyperglycemia: Secondary | ICD-10-CM | POA: Diagnosis not present

## 2014-11-21 DIAGNOSIS — Z794 Long term (current) use of insulin: Secondary | ICD-10-CM | POA: Diagnosis not present

## 2014-11-23 ENCOUNTER — Other Ambulatory Visit (INDEPENDENT_AMBULATORY_CARE_PROVIDER_SITE_OTHER): Payer: Medicare Other | Admitting: *Deleted

## 2014-11-23 DIAGNOSIS — E785 Hyperlipidemia, unspecified: Secondary | ICD-10-CM | POA: Diagnosis not present

## 2014-11-23 LAB — BASIC METABOLIC PANEL
BUN: 18 mg/dL (ref 6–23)
CHLORIDE: 106 meq/L (ref 96–112)
CO2: 27 mEq/L (ref 19–32)
Calcium: 9.3 mg/dL (ref 8.4–10.5)
Creatinine, Ser: 0.69 mg/dL (ref 0.40–1.20)
GFR: 88.32 mL/min (ref >60.00)
GLUCOSE: 164 mg/dL — AB (ref 70–99)
POTASSIUM: 3.8 meq/L (ref 3.5–5.1)
SODIUM: 138 meq/L (ref 135–145)

## 2014-11-23 LAB — LIPID PANEL
Cholesterol: 199 mg/dL (ref 0–200)
HDL: 57.1 mg/dL (ref >39.00)
LDL Cholesterol: 117 mg/dL — ABNORMAL HIGH (ref 0–99)
NonHDL: 141.9
Total CHOL/HDL Ratio: 3
Triglycerides: 126 mg/dL (ref 0.0–149.0)
VLDL: 25.2 mg/dL (ref 0.0–40.0)

## 2014-11-23 LAB — HEPATIC FUNCTION PANEL
ALBUMIN: 3.9 g/dL (ref 3.5–5.2)
ALT: 15 U/L (ref 0–35)
AST: 14 U/L (ref 0–37)
Alkaline Phosphatase: 73 U/L (ref 39–117)
Bilirubin, Direct: 0.1 mg/dL (ref 0.0–0.3)
TOTAL PROTEIN: 6.4 g/dL (ref 6.0–8.3)
Total Bilirubin: 0.9 mg/dL (ref 0.2–1.2)

## 2014-11-23 NOTE — Progress Notes (Signed)
Quick Note:  Please make copy of labs for patient visit. ______ 

## 2014-11-30 ENCOUNTER — Ambulatory Visit (INDEPENDENT_AMBULATORY_CARE_PROVIDER_SITE_OTHER): Payer: Medicare Other | Admitting: Cardiology

## 2014-11-30 ENCOUNTER — Encounter: Payer: Self-pay | Admitting: Cardiology

## 2014-11-30 VITALS — BP 142/78 | HR 76 | Ht 64.0 in | Wt 195.8 lb

## 2014-11-30 DIAGNOSIS — I639 Cerebral infarction, unspecified: Secondary | ICD-10-CM

## 2014-11-30 DIAGNOSIS — E785 Hyperlipidemia, unspecified: Secondary | ICD-10-CM | POA: Diagnosis not present

## 2014-11-30 DIAGNOSIS — R079 Chest pain, unspecified: Secondary | ICD-10-CM

## 2014-11-30 DIAGNOSIS — I119 Hypertensive heart disease without heart failure: Secondary | ICD-10-CM

## 2014-11-30 NOTE — Progress Notes (Signed)
Cardiology Office Note   Date:  11/30/2014   ID:  Whitney, Newton Jun 05, 1941, MRN [id number]  PCP:  Donnie Coffin, MD  Cardiologist: Darlin Coco MD  No chief complaint on file.     History of Present Illness: Whitney Newton is a 74 y.o. female who presents for a four-month follow-up office visit  This pleasant 74 year old woman is seen for a scheduled followup visit. The patient has a history of having had a stroke in early December 2014. She was at her diabetes clinic at [facility] and they diagnosed her and sent her straight to neurology where she was hospitalized for 3 days. The MRI showed a new stroke on the right side. She had carotid Dopplers which did not show any obstructive lesions but she did have some plaque and they put her back on aspirin and continued her Plavix. The patient had a echocardiogram which was a bubble study and did not show any evidence of right-to-left shunt. More recently, she has had further neurology workup at [facility] on 06/20/14. She had a transcranial Doppler. 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 Previous carotid Dopplers had not shown any significant external cranial disease She has a history of essential hypertension, diabetes mellitus, and dyslipidemia. She has had atypical chest pain. She is intolerant of statin drugs. We updated her lexiscan Myoview stress test on 03/17/12 and it was normal showing no evidence of ischemia and her ejection fraction was 78%. The patient has never had a cardiac catheterization. She has continued to have some shortness of breath. She had an echocardiogram in 04/07/12 which showed an ejection fraction of 55-65% with grade 1 diastolic dysfunction. There is trivial aortic insufficiency.  The patient has a history of essential hypertension as well as atypical  chest pain. Her diabetes is now being followed at the [facility] clinic at [facility]. Since we last saw her she had an ophthalmology evaluation on 07/27/14 by Dr. Kathrin Penner and no diabetic retinopathy was found. Her last chest x-ray was on 12/21/12 elsewhere and showed a normal heart size and low lung volumes. She had a recent CT scan of the chest on 03/23/13 at [facility] which showed normal heart size and demonstrated atherosclerosis of the aorta.  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  Since last visit she has not been experiencing any new cardiac symptoms. She does have sleep apnea. She uses a CPAP machine. She has had some atypical chest discomfort more suggestive of dyspepsia. She had run out of fresh nitroglycerin and we have called her in a new supply today. He has variable ankle edema and we called her in a new prescription for Lasix as well. She has had some low back pain and pain in her hip radiating to her knees.  She has seen Dr. Durward Fortes.  Past Medical History  Diagnosis Date  . Hypertension   . Thyroid disease     hypothyroidism  . Gastritis   . Diverticulitis   . Back pain     prior back surgery in 2009 - slow to recover  . Diabetes   . HLD (hyperlipidemia)     statin intolerant  . Normal cardiac stress test 2011  . Obesity   . Hemangioma of liver  managed conservatively; followed at [facility]    Past Surgical History  Procedure Laterality Date  . Abdominal hysterectomy    . Nasal reconstruction    . Appendectomy       Current Outpatient Prescriptions  Medication Sig Dispense Refill  . aspirin 81 MG tablet Take 81 mg by mouth daily.    . bisoprolol-hydrochlorothiazide (ZIAC) 10-6.25 MG per tablet Take 1 tablet by mouth daily.    . clopidogrel (PLAVIX) 75 MG tablet Take 75 mg by mouth daily.    . colesevelam (WELCHOL) 625 MG tablet Take 1,875 mg by  mouth as needed (cholesterol).     . cyclobenzaprine (FLEXERIL) 5 MG tablet Take 2.5 mg by mouth daily as needed (muscle spasms).     . furosemide (LASIX) 40 MG tablet Take 1 tablet (40 mg total) by mouth daily as needed for fluid. 30 tablet 3  . gabapentin (NEURONTIN) 100 MG capsule Take 200 mg by mouth daily as needed (nerve pain).     Marland Kitchen glucose blood ([employer] BLOOD GLUCOSE TEST STRIPS) test strip Used to check Blood Sugar 4 times daily. Dx. Code 250.00    . hydrochlorothiazide (HYDRODIURIL) 25 MG tablet Take 25 mg by mouth daily. Take 1/4 tablet by mouth daily    . hyoscyamine (LEVBID) 0.375 MG 12 hr tablet Take 0.375 mg by mouth every 12 (twelve) hours as needed (cramps).     . insulin aspart (NOVOLOG FLEXPEN) 100 UNIT/ML FlexPen Inject into the skin 3 (three) times daily with meals. Inject under skin before each meal and hold if skips meal.   AM: 6 units, NOON: 6 units, PM: 10 units    . insulin glargine (LANTUS) 100 UNIT/ML injection Inject 32 Units into the skin daily.     . Lancets (ACCU-CHEK MULTICLIX) lancets Used to check Blood Sugar 4 times daily. Dx. Code 250.00.    Marland Kitchen levothyroxine (SYNTHROID, LEVOTHROID) 125 MCG tablet Take 125 mcg by mouth daily.    Marland Kitchen losartan (COZAAR) 25 MG tablet Take 25 mg by mouth daily.    . nitroGLYCERIN (NITROSTAT) 0.4 MG SL tablet DISSOLVE ONE TABLET UNDER TONGUE AS NEEDED FOR ARM/CHEST PAIN. 25 tablet PRN  . Omeprazole-Sodium Bicarbonate (ZEGERID PO) Take 1 tablet by mouth daily.     . potassium chloride SA (K-DUR,KLOR-CON) 20 MEQ tablet Take 20 mEq by mouth. TAke one tablet by mouth on Monday, Wednesday, and Friday or as directed     No current facility-administered medications for this visit.    Allergies:   Celecoxib; Ropinirole hcl; Actos; Crestor; Doxycycline; Fenofibrate; Imdur; Lipitor; Metformin and related; Niaspan; Pioglitazone; Pravachol; Ranolazine er; Rosuvastatin; and Zetia    Social History:  The patient  reports that she has never smoked. She  does not have any smokeless tobacco history on file. She reports that she does not drink alcohol or use illicit drugs.   Family History:  The patient's family history includes Heart disease in her brother and father.    ROS:  Please see the history of present illness.   Otherwise, review of systems are positive for none.   All other systems are reviewed and negative.    PHYSICAL EXAM: VS:  BP 142/78 mmHg  Pulse 76  Ht 5\' 4"  (1.626 m)  Wt 195 lb 12.8 oz (88.814 kg)  BMI 33.59 kg/m2  SpO2 98% , BMI Body mass index is 33.59 kg/(m^2). GEN: Well nourished, well developed, in no acute distress HEENT: normal Neck: no JVD, carotid bruits, or masses Cardiac: RRR; no  murmurs, rubs, or gallops,no edema .  Pedal pulses are 1+ Respiratory:  clear to auscultation bilaterally, normal work of breathing GI: soft, nontender, nondistended, + BS MS: no deformity or atrophy Skin: warm and dry, no rash Neuro:  Strength and sensation are intact Psych: euthymic mood, full affect   EKG:  EKG is not ordered today.    Recent Labs: 11/23/2014: ALT 15; BUN 18; Creatinine, Ser 0.69; Potassium 3.8; Sodium 138    Lipid Panel    Component Value Date/Time   CHOL 199 11/23/2014 1116   TRIG 126.0 11/23/2014 1116   HDL 57.10 11/23/2014 1116   CHOLHDL 3 11/23/2014 1116   VLDL 25.2 11/23/2014 1116   LDLCALC 117* 11/23/2014 1116   LDLDIRECT 148.7 09/14/2012 0857      Wt Readings from Last 3 Encounters:  11/30/14 195 lb 12.8 oz (88.814 kg)  08/02/14 195 lb 12.8 oz (88.814 kg)  02/07/14 189 lb (85.73 kg)         ASSESSMENT AND PLAN:  1. Hypertensive heart disease without heart failure 2. diabetes mellitus.  Recent A1c at the [facility] clinic was 7.4 3. Atypical chest pain with normal Myoview stress test in October 2013. 55. old CVA followed at [facility] 5. History of dermatofibro- sarcoma of the spine. 6. Dyslipidemia 7. Osteoarthritis 8. OSA on CPAP [facility])  Disposition:  Continue same medication. Continue on WelChol. Recheck here in 4 months for office visit EKG lipid panel hepatic function panel basal metabolic. Her A1c's are followed at the [facility] clinic   Current medicines are reviewed at length with the patient today.  The patient does not have concerns regarding medicines.  The following changes have been made:  no change  Labs/ tests ordered today include:   Orders Placed This Encounter  Procedures  . Lipid panel  . Hepatic function panel  . Basic metabolic panel    Disposition: Continue current medication.  Recheck in 4 months for office visit EKG lipid panel hepatic function panel and basal metabolic panel  Signed, Darlin Coco MD 11/30/2014 5:09 PM    [facility] [address], [place], [place]  [address] Phone: [phone]; Fax: [phone]

## 2014-11-30 NOTE — Patient Instructions (Signed)
Medication Instructions:  Your physician recommends that you continue on your current medications as directed. Please refer to the Current Medication list given to you today.  Labwork: none  Testing/Procedures: none  Follow-Up: Your physician wants you to follow-up in: 4 months with fasting labs (lp/bmet/hfp) and ekg  You will receive a reminder letter in the mail two months in advance. If you don't receive a letter, please call our office to schedule the follow-up appointment.

## 2014-12-02 DIAGNOSIS — E119 Type 2 diabetes mellitus without complications: Secondary | ICD-10-CM | POA: Diagnosis not present

## 2014-12-02 DIAGNOSIS — I6930 Unspecified sequelae of cerebral infarction: Secondary | ICD-10-CM | POA: Diagnosis not present

## 2014-12-02 DIAGNOSIS — I10 Essential (primary) hypertension: Secondary | ICD-10-CM | POA: Diagnosis not present

## 2014-12-02 DIAGNOSIS — E785 Hyperlipidemia, unspecified: Secondary | ICD-10-CM | POA: Diagnosis not present

## 2014-12-02 DIAGNOSIS — M5416 Radiculopathy, lumbar region: Secondary | ICD-10-CM | POA: Insufficient documentation

## 2014-12-02 DIAGNOSIS — G4730 Sleep apnea, unspecified: Secondary | ICD-10-CM | POA: Diagnosis not present

## 2015-01-17 ENCOUNTER — Encounter (HOSPITAL_COMMUNITY): Payer: Self-pay | Admitting: *Deleted

## 2015-01-17 DIAGNOSIS — R0602 Shortness of breath: Secondary | ICD-10-CM | POA: Diagnosis not present

## 2015-01-17 DIAGNOSIS — K219 Gastro-esophageal reflux disease without esophagitis: Secondary | ICD-10-CM | POA: Diagnosis not present

## 2015-01-17 DIAGNOSIS — R0789 Other chest pain: Secondary | ICD-10-CM | POA: Diagnosis not present

## 2015-01-17 DIAGNOSIS — E119 Type 2 diabetes mellitus without complications: Secondary | ICD-10-CM | POA: Diagnosis present

## 2015-01-17 DIAGNOSIS — R911 Solitary pulmonary nodule: Secondary | ICD-10-CM | POA: Diagnosis present

## 2015-01-17 DIAGNOSIS — I110 Hypertensive heart disease with heart failure: Secondary | ICD-10-CM | POA: Diagnosis present

## 2015-01-17 DIAGNOSIS — E039 Hypothyroidism, unspecified: Secondary | ICD-10-CM | POA: Diagnosis present

## 2015-01-17 DIAGNOSIS — R079 Chest pain, unspecified: Secondary | ICD-10-CM | POA: Diagnosis not present

## 2015-01-17 DIAGNOSIS — I5032 Chronic diastolic (congestive) heart failure: Secondary | ICD-10-CM | POA: Diagnosis not present

## 2015-01-17 DIAGNOSIS — E785 Hyperlipidemia, unspecified: Secondary | ICD-10-CM | POA: Diagnosis not present

## 2015-01-17 DIAGNOSIS — E669 Obesity, unspecified: Secondary | ICD-10-CM | POA: Diagnosis present

## 2015-01-17 DIAGNOSIS — Z8673 Personal history of transient ischemic attack (TIA), and cerebral infarction without residual deficits: Secondary | ICD-10-CM

## 2015-01-17 DIAGNOSIS — Z79899 Other long term (current) drug therapy: Secondary | ICD-10-CM

## 2015-01-17 DIAGNOSIS — Z8249 Family history of ischemic heart disease and other diseases of the circulatory system: Secondary | ICD-10-CM

## 2015-01-17 DIAGNOSIS — N179 Acute kidney failure, unspecified: Principal | ICD-10-CM | POA: Diagnosis present

## 2015-01-17 DIAGNOSIS — Z6833 Body mass index (BMI) 33.0-33.9, adult: Secondary | ICD-10-CM

## 2015-01-17 DIAGNOSIS — G4733 Obstructive sleep apnea (adult) (pediatric): Secondary | ICD-10-CM | POA: Diagnosis present

## 2015-01-17 DIAGNOSIS — Z9119 Patient's noncompliance with other medical treatment and regimen: Secondary | ICD-10-CM

## 2015-01-17 DIAGNOSIS — Z7902 Long term (current) use of antithrombotics/antiplatelets: Secondary | ICD-10-CM

## 2015-01-17 DIAGNOSIS — Z7982 Long term (current) use of aspirin: Secondary | ICD-10-CM

## 2015-01-17 DIAGNOSIS — Z888 Allergy status to other drugs, medicaments and biological substances status: Secondary | ICD-10-CM

## 2015-01-17 DIAGNOSIS — Z794 Long term (current) use of insulin: Secondary | ICD-10-CM

## 2015-01-17 LAB — CBC WITH DIFFERENTIAL/PLATELET
Basophils Absolute: 0 10*3/uL (ref 0.0–0.1)
Basophils Relative: 0 % (ref 0–1)
Eosinophils Absolute: 0.2 10*3/uL (ref 0.0–0.7)
Eosinophils Relative: 3 % (ref 0–5)
HEMATOCRIT: 42.2 % (ref 36.0–46.0)
Hemoglobin: 14.6 g/dL (ref 12.0–15.0)
LYMPHS ABS: 3 10*3/uL (ref 0.7–4.0)
Lymphocytes Relative: 38 % (ref 12–46)
MCH: 30.5 pg (ref 26.0–34.0)
MCHC: 34.6 g/dL (ref 30.0–36.0)
MCV: 88.3 fL (ref 78.0–100.0)
MONO ABS: 0.6 10*3/uL (ref 0.1–1.0)
MONOS PCT: 7 % (ref 3–12)
Neutro Abs: 4.1 10*3/uL (ref 1.7–7.7)
Neutrophils Relative %: 52 % (ref 43–77)
Platelets: 234 10*3/uL (ref 150–400)
RBC: 4.78 MIL/uL (ref 3.87–5.11)
RDW: 12.9 % (ref 11.5–15.5)
WBC: 8 10*3/uL (ref 4.0–10.5)

## 2015-01-17 NOTE — ED Notes (Signed)
Patient presents stating she started with chest pain earlier today.  Ate fried onions rings yesterday on her way home from [place] from a funeral and thought her refux was acting up.  Stated the CP traveled across her chest and up into her jaw

## 2015-01-18 ENCOUNTER — Observation Stay (HOSPITAL_BASED_OUTPATIENT_CLINIC_OR_DEPARTMENT_OTHER): Payer: Medicare Other

## 2015-01-18 ENCOUNTER — Inpatient Hospital Stay (HOSPITAL_COMMUNITY)
Admission: EM | Admit: 2015-01-18 | Discharge: 2015-01-19 | DRG: 683 | Disposition: A | Discharge status: Home / Self Care | Payer: Medicare Other | Attending: Internal Medicine | Admitting: Internal Medicine

## 2015-01-18 ENCOUNTER — Emergency Department (HOSPITAL_COMMUNITY): Payer: Medicare Other

## 2015-01-18 ENCOUNTER — Encounter (HOSPITAL_COMMUNITY): Payer: Self-pay | Admitting: Internal Medicine

## 2015-01-18 ENCOUNTER — Observation Stay (HOSPITAL_COMMUNITY): Payer: Medicare Other

## 2015-01-18 DIAGNOSIS — E039 Hypothyroidism, unspecified: Secondary | ICD-10-CM | POA: Diagnosis not present

## 2015-01-18 DIAGNOSIS — I209 Angina pectoris, unspecified: Secondary | ICD-10-CM | POA: Diagnosis not present

## 2015-01-18 DIAGNOSIS — N179 Acute kidney failure, unspecified: Secondary | ICD-10-CM | POA: Diagnosis present

## 2015-01-18 DIAGNOSIS — I5032 Chronic diastolic (congestive) heart failure: Secondary | ICD-10-CM | POA: Diagnosis present

## 2015-01-18 DIAGNOSIS — R911 Solitary pulmonary nodule: Secondary | ICD-10-CM | POA: Diagnosis present

## 2015-01-18 DIAGNOSIS — K219 Gastro-esophageal reflux disease without esophagitis: Secondary | ICD-10-CM | POA: Insufficient documentation

## 2015-01-18 DIAGNOSIS — R609 Edema, unspecified: Secondary | ICD-10-CM | POA: Diagnosis not present

## 2015-01-18 DIAGNOSIS — I10 Essential (primary) hypertension: Secondary | ICD-10-CM | POA: Insufficient documentation

## 2015-01-18 DIAGNOSIS — Z8249 Family history of ischemic heart disease and other diseases of the circulatory system: Secondary | ICD-10-CM | POA: Diagnosis not present

## 2015-01-18 DIAGNOSIS — Z9119 Patient's noncompliance with other medical treatment and regimen: Secondary | ICD-10-CM | POA: Diagnosis present

## 2015-01-18 DIAGNOSIS — R918 Other nonspecific abnormal finding of lung field: Secondary | ICD-10-CM | POA: Diagnosis not present

## 2015-01-18 DIAGNOSIS — Z888 Allergy status to other drugs, medicaments and biological substances status: Secondary | ICD-10-CM | POA: Diagnosis not present

## 2015-01-18 DIAGNOSIS — R0609 Other forms of dyspnea: Secondary | ICD-10-CM

## 2015-01-18 DIAGNOSIS — D1803 Hemangioma of intra-abdominal structures: Secondary | ICD-10-CM | POA: Insufficient documentation

## 2015-01-18 DIAGNOSIS — Z8673 Personal history of transient ischemic attack (TIA), and cerebral infarction without residual deficits: Secondary | ICD-10-CM | POA: Diagnosis not present

## 2015-01-18 DIAGNOSIS — Z7982 Long term (current) use of aspirin: Secondary | ICD-10-CM | POA: Diagnosis not present

## 2015-01-18 DIAGNOSIS — E119 Type 2 diabetes mellitus without complications: Secondary | ICD-10-CM | POA: Diagnosis not present

## 2015-01-18 DIAGNOSIS — I25110 Atherosclerotic heart disease of native coronary artery with unstable angina pectoris: Secondary | ICD-10-CM | POA: Diagnosis not present

## 2015-01-18 DIAGNOSIS — I2699 Other pulmonary embolism without acute cor pulmonale: Secondary | ICD-10-CM

## 2015-01-18 DIAGNOSIS — Z79899 Other long term (current) drug therapy: Secondary | ICD-10-CM | POA: Diagnosis not present

## 2015-01-18 DIAGNOSIS — Z6833 Body mass index (BMI) 33.0-33.9, adult: Secondary | ICD-10-CM | POA: Diagnosis not present

## 2015-01-18 DIAGNOSIS — R079 Chest pain, unspecified: Secondary | ICD-10-CM | POA: Diagnosis not present

## 2015-01-18 DIAGNOSIS — I110 Hypertensive heart disease with heart failure: Secondary | ICD-10-CM | POA: Diagnosis present

## 2015-01-18 DIAGNOSIS — E669 Obesity, unspecified: Secondary | ICD-10-CM | POA: Diagnosis present

## 2015-01-18 DIAGNOSIS — E785 Hyperlipidemia, unspecified: Secondary | ICD-10-CM | POA: Diagnosis not present

## 2015-01-18 DIAGNOSIS — Z794 Long term (current) use of insulin: Secondary | ICD-10-CM | POA: Diagnosis not present

## 2015-01-18 DIAGNOSIS — G4733 Obstructive sleep apnea (adult) (pediatric): Secondary | ICD-10-CM | POA: Diagnosis present

## 2015-01-18 DIAGNOSIS — Z7902 Long term (current) use of antithrombotics/antiplatelets: Secondary | ICD-10-CM | POA: Diagnosis not present

## 2015-01-18 HISTORY — DX: Malignant (primary) neoplasm, unspecified: C80.1

## 2015-01-18 LAB — TROPONIN I: Troponin I: <0.03 ng/mL (ref <0.031)

## 2015-01-18 LAB — URINALYSIS, ROUTINE W REFLEX MICROSCOPIC
Bilirubin Urine: NEGATIVE
Glucose, UA: NEGATIVE mg/dL
Hgb urine dipstick: NEGATIVE
KETONES UR: NEGATIVE mg/dL
Leukocytes, UA: NEGATIVE
NITRITE: NEGATIVE
PROTEIN: NEGATIVE mg/dL
Specific Gravity, Urine: 1.008 (ref 1.005–1.030)
Urobilinogen, UA: 0.2 mg/dL (ref 0.0–1.0)
pH: 5 (ref 5.0–8.0)

## 2015-01-18 LAB — BASIC METABOLIC PANEL
ANION GAP: 9 (ref 5–15)
BUN: 19 mg/dL (ref 6–20)
CALCIUM: 9.7 mg/dL (ref 8.9–10.3)
CHLORIDE: 101 mmol/L (ref 101–111)
CO2: 29 mmol/L (ref 22–32)
Creatinine, Ser: 1.36 mg/dL — ABNORMAL HIGH (ref 0.44–1.00)
GFR calc Af Amer: 43 mL/min — ABNORMAL LOW (ref >60)
GFR calc non Af Amer: 37 mL/min — ABNORMAL LOW (ref >60)
Glucose, Bld: 170 mg/dL — ABNORMAL HIGH (ref 65–99)
POTASSIUM: 3.5 mmol/L (ref 3.5–5.1)
Sodium: 139 mmol/L (ref 135–145)

## 2015-01-18 LAB — CBC
HEMATOCRIT: 40.9 % (ref 36.0–46.0)
HEMOGLOBIN: 14.2 g/dL (ref 12.0–15.0)
MCH: 30.7 pg (ref 26.0–34.0)
MCHC: 34.7 g/dL (ref 30.0–36.0)
MCV: 88.3 fL (ref 78.0–100.0)
Platelets: 236 10*3/uL (ref 150–400)
RBC: 4.63 MIL/uL (ref 3.87–5.11)
RDW: 13 % (ref 11.5–15.5)
WBC: 7.4 10*3/uL (ref 4.0–10.5)

## 2015-01-18 LAB — CBG MONITORING, ED
GLUCOSE-CAPILLARY: 100 mg/dL — AB (ref 65–99)
Glucose-Capillary: 120 mg/dL — ABNORMAL HIGH (ref 65–99)
Glucose-Capillary: 276 mg/dL — ABNORMAL HIGH (ref 65–99)

## 2015-01-18 LAB — CREATININE, SERUM
Creatinine, Ser: 0.94 mg/dL (ref 0.44–1.00)
GFR calc Af Amer: >60 mL/min (ref >60)
GFR calc non Af Amer: 58 mL/min — ABNORMAL LOW (ref >60)

## 2015-01-18 LAB — GLUCOSE, CAPILLARY
GLUCOSE-CAPILLARY: 187 mg/dL — AB (ref 65–99)
GLUCOSE-CAPILLARY: 276 mg/dL — AB (ref 65–99)

## 2015-01-18 LAB — D-DIMER, QUANTITATIVE: D-Dimer, Quant: 3.09 ug/mL-FEU — ABNORMAL HIGH (ref 0.00–0.48)

## 2015-01-18 MED ORDER — ONDANSETRON HCL 4 MG/2ML IJ SOLN
4.0000 mg | Freq: Four times a day (QID) | INTRAMUSCULAR | Status: DC | PRN
Start: 2015-01-18 — End: 2015-01-19

## 2015-01-18 MED ORDER — HYOSCYAMINE SULFATE ER 0.375 MG PO TB12
0.3750 mg | ORAL_TABLET | Freq: Two times a day (BID) | ORAL | Status: DC | PRN
  Administered 2015-01-18 – 2015-01-19 (×2): 0.375 mg via ORAL
  Filled 2015-01-18 (×4): qty 1

## 2015-01-18 MED ORDER — ASPIRIN 81 MG PO TABS: 81.0000 mg | ORAL_TABLET | Freq: Every day | ORAL | Status: DC

## 2015-01-18 MED ORDER — OMEPRAZOLE-SODIUM BICARBONATE 20-1100 MG PO CAPS
1.0000 | ORAL_CAPSULE | Freq: Every day | ORAL | Status: DC
  Filled 2015-01-18 (×2): qty 1

## 2015-01-18 MED ORDER — PANTOPRAZOLE SODIUM 40 MG PO PACK
40.0000 mg | PACK | Freq: Every day | ORAL | Status: DC
Start: 2015-01-18 — End: 2015-01-19
  Administered 2015-01-18 – 2015-01-19 (×2): 40 mg
  Filled 2015-01-18 (×2): qty 20

## 2015-01-18 MED ORDER — INSULIN GLARGINE 100 UNIT/ML ~~LOC~~ SOLN
32.0000 [IU] | SUBCUTANEOUS | Status: DC
  Administered 2015-01-18 – 2015-01-19 (×2): 32 [IU] via SUBCUTANEOUS
  Filled 2015-01-18 (×4): qty 0.32

## 2015-01-18 MED ORDER — GABAPENTIN 100 MG PO CAPS
200.0000 mg | ORAL_CAPSULE | Freq: Every day | ORAL | Status: DC | PRN
  Administered 2015-01-18: 200 mg via ORAL
  Filled 2015-01-18 (×3): qty 2

## 2015-01-18 MED ORDER — CLOPIDOGREL BISULFATE 75 MG PO TABS
75.0000 mg | ORAL_TABLET | Freq: Every day | ORAL | Status: DC
  Administered 2015-01-18 – 2015-01-19 (×2): 75 mg via ORAL
  Filled 2015-01-18 (×2): qty 1

## 2015-01-18 MED ORDER — SODIUM CHLORIDE 0.9 % IV SOLN
INTRAVENOUS | Status: AC
  Administered 2015-01-18: 05:00:00 via INTRAVENOUS

## 2015-01-18 MED ORDER — ASPIRIN 81 MG PO CHEW
81.0000 mg | CHEWABLE_TABLET | Freq: Every day | ORAL | Status: DC
  Administered 2015-01-18 – 2015-01-19 (×2): 81 mg via ORAL
  Filled 2015-01-18 (×2): qty 1

## 2015-01-18 MED ORDER — ACETAMINOPHEN 325 MG PO TABS: 650.0000 mg | ORAL_TABLET | ORAL | Status: DC | PRN

## 2015-01-18 MED ORDER — BISOPROLOL FUMARATE 10 MG PO TABS
10.0000 mg | ORAL_TABLET | Freq: Every day | ORAL | Status: DC
  Administered 2015-01-18 – 2015-01-19 (×2): 10 mg via ORAL
  Filled 2015-01-18 (×2): qty 1

## 2015-01-18 MED ORDER — COLESEVELAM HCL 625 MG PO TABS
1875.0000 mg | ORAL_TABLET | Freq: Two times a day (BID) | ORAL | Status: DC | PRN
  Administered 2015-01-18 – 2015-01-19 (×2): 1875 mg via ORAL
  Filled 2015-01-18 (×4): qty 3

## 2015-01-18 MED ORDER — CYCLOBENZAPRINE HCL 5 MG PO TABS
2.5000 mg | ORAL_TABLET | Freq: Every day | ORAL | Status: DC | PRN
  Filled 2015-01-18: qty 0.5

## 2015-01-18 MED ORDER — MORPHINE SULFATE 2 MG/ML IJ SOLN: 2.0000 mg | INTRAMUSCULAR | Status: DC | PRN

## 2015-01-18 MED ORDER — LEVOTHYROXINE SODIUM 125 MCG PO TABS
125.0000 ug | ORAL_TABLET | Freq: Every day | ORAL | Status: DC
  Administered 2015-01-18 – 2015-01-19 (×2): 125 ug via ORAL
  Filled 2015-01-18 (×3): qty 1

## 2015-01-18 MED ORDER — NITROGLYCERIN 0.4 MG SL SUBL: 0.4000 mg | SUBLINGUAL_TABLET | SUBLINGUAL | Status: DC | PRN

## 2015-01-18 MED ORDER — ENOXAPARIN SODIUM 40 MG/0.4ML ~~LOC~~ SOLN
40.0000 mg | SUBCUTANEOUS | Status: DC
  Administered 2015-01-18 – 2015-01-19 (×2): 40 mg via SUBCUTANEOUS
  Filled 2015-01-18 (×4): qty 0.4

## 2015-01-18 MED ORDER — INSULIN ASPART 100 UNIT/ML ~~LOC~~ SOLN
0.0000 [IU] | Freq: Three times a day (TID) | SUBCUTANEOUS | Status: DC
  Administered 2015-01-18: 5 [IU] via SUBCUTANEOUS
  Administered 2015-01-18 – 2015-01-19 (×2): 2 [IU] via SUBCUTANEOUS
  Administered 2015-01-19: 3 [IU] via SUBCUTANEOUS
  Filled 2015-01-18: qty 1

## 2015-01-18 MED ORDER — LOSARTAN POTASSIUM 25 MG PO TABS
25.0000 mg | ORAL_TABLET | Freq: Every day | ORAL | Status: DC
  Administered 2015-01-18 – 2015-01-19 (×2): 25 mg via ORAL
  Filled 2015-01-18 (×2): qty 1

## 2015-01-18 MED ORDER — IOHEXOL 350 MG/ML SOLN
80.0000 mL | Freq: Once | INTRAVENOUS | Status: AC | PRN
  Administered 2015-01-18: 80 mL via INTRAVENOUS

## 2015-01-18 NOTE — ED Notes (Signed)
Pt in vascular.

## 2015-01-18 NOTE — ED Notes (Signed)
Ordered meal tray for pt 

## 2015-01-18 NOTE — ED Notes (Signed)
Cardiology at bedside.

## 2015-01-18 NOTE — ED Notes (Signed)
Attempted report x1. 

## 2015-01-18 NOTE — Consult Note (Addendum)
Patient ID: Whitney Newton MRN: [id number] DOB/AGE: July 02, 1940 74 y.o.  Admit date: 01/18/2015 Primary Physician Donnie Coffin, MD Primary Cardiologist Dr. Darlin Coco, MD   Chief Complaint  Chest pain  HPI: Whitney Newton is a 19F with DM type II, HTN, HL, CVA and hypothyroidism who presents with chest pain, nausea and vomiting.  Whitney Newton first noticed increased exertional dyspnea two days ago.  The following AM she was awakened from sleep with what she thought was terrible reflux.  She reports nausea and emesis consisting of mucous and bile.  She denies hematemesis.  Later that day she noted intermittent R sided chest pain that radiated up to her R jaw.  Later she noted L sided chest pain.  Each episode lasted between 5-10 minutes. She noted associated shortness of breath but denies diaphoresis. Given the persistent symptoms she presented to the emergency department. While waiting in the ED she had 5 out of 10 bandlike pressure across her chest. She did take nitroglycerin before leaving home and this did help somewhat. The pain has ranged from 2-6 out of 10 in severity.  In the ED Whitney Newton has been hemodynamically stable. Her EKG was unchanged from prior and cardiac enzymes were negative 2. D-dimer was elevated at 3.09, though she had negative lower extremity Dopplers. She was noted to have acute renal failure with a creatinine of 1.4 up from her baseline of 0.69.  Whitney Newton denies palpitations, lightheadedness, dizziness, orthopea or PND.  She has noted LE edema.  Review of Systems:     Cardiac Review of Systems: {Y] = yes [ ]  = no  Chest Pain [ x   ]  Resting SOB [   ] Exertional SOB  [ x ]  Orthopnea [  ]   Pedal Edema [ x  ]    Palpitations [  ] Syncope  [  ]   Presyncope [   ]  General Review of Systems: [Y] = yes [  ]=no Constitional: recent weight change [  ]; anorexia [  ]; fatigue [  ]; nausea [  ]; night sweats [  ]; fever [  ]; or chills [  ];                                                                      Eyes : blurred vision [  ]; diplopia [   ]; vision changes [  ];  Amaurosis fugax[  ]; Resp: cough [  ];  wheezing[  ];  hemoptysis[  ];  PND [  ];  GI:  gallstones[  ], vomiting[  ];  dysphagia[  ]; melena[  ];  hematochezia [  ]; heartburn[  ];   GU: kidney stones [  ]; hematuria[  ];   dysuria [  ];  nocturia[  ]; incontinence [  ];             Skin: rash, swelling[  ];, hair loss[  ];  peripheral edema[  ];  or itching[  ]; Musculosketetal: myalgias[  ];  joint swelling[  ];  joint erythema[  ];  joint pain[  ];  back pain[  ];  Heme/Lymph: bruising[  ];  bleeding[  ];  anemia[  ];  Neuro: TIA[  ];  headaches[  ];  stroke[  ];  vertigo[  ];  seizures[  ];   paresthesias[  ];  difficulty walking[  ];  Psych:depression[  ]; anxiety[  ];  Endocrine: diabetes[  ];  thyroid dysfunction[  ];  Other:  Past Medical History  Diagnosis Date  . Hypertension   . Thyroid disease     hypothyroidism  . Gastritis   . Diverticulitis   . Back pain     prior back surgery in 2009 - slow to recover  . Diabetes   . HLD (hyperlipidemia)     statin intolerant  . Normal cardiac stress test 2011  . Obesity   . Hemangioma of liver     managed conservatively; followed at [facility]     (Not in a hospital admission)   . aspirin  81 mg Oral Daily  . bisoprolol  10 mg Oral Daily  . clopidogrel  75 mg Oral Daily  . enoxaparin (LOVENOX) injection  40 mg Subcutaneous Q24H  . insulin aspart  0-9 Units Subcutaneous TID WC  . insulin glargine  32 Units Subcutaneous BH-q7a  . levothyroxine  125 mcg Oral QAC breakfast  . losartan  25 mg Oral Daily  . pantoprazole sodium  40 mg Per Tube Daily    Infusions: . sodium chloride 75 mL/hr at 01/18/15 0444    Allergies  Allergen Reactions  . Celecoxib Shortness Of Breath  . Ropinirole Hcl Other (See Comments)    Restless legs syndrome.  . Actos [Pioglitazone Hydrochloride]     swelling  . Crestor [Rosuvastatin  Calcium]     LEG PAIN  . Doxycycline     gastritis  . Fenofibrate Other (See Comments)    unknown  . Imdur [Isosorbide]     headache  . Lipitor [Atorvastatin Calcium]     CHEST PAIN, SOB  . Metformin And Related Other (See Comments)    unknown  . Niaspan [Niacin Er]     RASH  . Pioglitazone Swelling  . Pravachol Other (See Comments)    Leg pain  . Ranolazine Er     EDEMA  . Rosuvastatin Other (See Comments)    LEG PAIN  . Zetia [Ezetimibe]     NO SLEEP, SOB    Social History   Social History  . Marital Status: Married    Spouse Name: N/A  . Number of Children: N/A  . Years of Education: N/A   Occupational History  . Not on file.   Social History Main Topics  . Smoking status: Never Smoker   . Smokeless tobacco: Never Used  . Alcohol Use: No  . Drug Use: No  . Sexual Activity: No   Other Topics Concern  . Not on file   Social History Narrative    Family History  Problem Relation Age of Onset  . Heart disease Father   . Heart disease Brother     PHYSICAL EXAM: Filed Vitals:   01/18/15 0935  BP: 121/56  Pulse: 58  Temp:   Resp: 12    No intake or output data in the 24 hours ending 01/18/15 1110  General:  Well appearing. No respiratory difficulty HEENT: normal Neck: supple. JVP 1 cm above clavicle at 45 degrees. Carotids 2+ bilat; no bruits. No lymphadenopathy or thryomegaly appreciated. Cor: PMI nondisplaced. Regular rate & rhythm. No rubs, gallops or murmurs. Lungs: clear Abdomen: soft, nontender, nondistended. No hepatosplenomegaly. No bruits or masses. Good  bowel sounds. Extremities: no cyanosis, clubbing, rash, 1+ pitting edema Neuro: alert & oriented x 3, cranial nerves grossly intact. moves all 4 extremities w/o difficulty. Affect pleasant.  Results for orders placed or performed during the hospital encounter of 01/18/15 (from the past 24 hour(s))  Basic metabolic panel     Status: Abnormal   Collection Time: 01/17/15 11:46 PM  Result  Value Ref Range   Sodium 139 135 - 145 mmol/L   Potassium 3.5 3.5 - 5.1 mmol/L   Chloride 101 101 - 111 mmol/L   CO2 29 22 - 32 mmol/L   Glucose, Bld 170 (H) 65 - 99 mg/dL   BUN 19 6 - 20 mg/dL   Creatinine, Ser 1.36 (H) 0.44 - 1.00 mg/dL   Calcium 9.7 8.9 - 10.3 mg/dL   GFR calc non Af Amer 37 (L) >60 mL/min   GFR calc Af Amer 43 (L) >60 mL/min   Anion gap 9 5 - 15  Troponin I     Status: None   Collection Time: 01/17/15 11:46 PM  Result Value Ref Range   Troponin I <0.03 <0.031 ng/mL  CBC with Differential     Status: None   Collection Time: 01/17/15 11:46 PM  Result Value Ref Range   WBC 8.0 4.0 - 10.5 K/uL   RBC 4.78 3.87 - 5.11 MIL/uL   Hemoglobin 14.6 12.0 - 15.0 g/dL   HCT 42.2 36.0 - 46.0 %   MCV 88.3 78.0 - 100.0 fL   MCH 30.5 26.0 - 34.0 pg   MCHC 34.6 30.0 - 36.0 g/dL   RDW 12.9 11.5 - 15.5 %   Platelets 234 150 - 400 K/uL   Neutrophils Relative % 52 43 - 77 %   Neutro Abs 4.1 1.7 - 7.7 K/uL   Lymphocytes Relative 38 12 - 46 %   Lymphs Abs 3.0 0.7 - 4.0 K/uL   Monocytes Relative 7 3 - 12 %   Monocytes Absolute 0.6 0.1 - 1.0 K/uL   Eosinophils Relative 3 0 - 5 %   Eosinophils Absolute 0.2 0.0 - 0.7 K/uL   Basophils Relative 0 0 - 1 %   Basophils Absolute 0.0 0.0 - 0.1 K/uL  CBG monitoring, ED     Status: Abnormal   Collection Time: 01/18/15  2:57 AM  Result Value Ref Range   Glucose-Capillary 120 (H) 65 - 99 mg/dL  D-dimer, quantitative (not at [facility])     Status: Abnormal   Collection Time: 01/18/15  3:25 AM  Result Value Ref Range   D-Dimer, Quant 3.09 (H) 0.00 - 0.48 ug/mL-FEU  Troponin I (q 6hr x 3)     Status: None   Collection Time: 01/18/15  6:01 AM  Result Value Ref Range   Troponin I <0.03 <0.031 ng/mL  CBC     Status: None   Collection Time: 01/18/15  6:01 AM  Result Value Ref Range   WBC 7.4 4.0 - 10.5 K/uL   RBC 4.63 3.87 - 5.11 MIL/uL   Hemoglobin 14.2 12.0 - 15.0 g/dL   HCT 40.9 36.0 - 46.0 %   MCV 88.3 78.0 - 100.0 fL   MCH 30.7 26.0 -  34.0 pg   MCHC 34.7 30.0 - 36.0 g/dL   RDW 13.0 11.5 - 15.5 %   Platelets 236 150 - 400 K/uL  Creatinine, serum     Status: Abnormal   Collection Time: 01/18/15  6:01 AM  Result Value Ref Range   Creatinine, Ser 0.94  0.44 - 1.00 mg/dL   GFR calc non Af Amer 58 (L) >60 mL/min   GFR calc Af Amer >60 >60 mL/min  Urinalysis, Routine w reflex microscopic (not at [facility])     Status: None   Collection Time: 01/18/15  6:23 AM  Result Value Ref Range   Color, Urine YELLOW YELLOW   APPearance CLEAR CLEAR   Specific Gravity, Urine 1.008 1.005 - 1.030   pH 5.0 5.0 - 8.0   Glucose, UA NEGATIVE NEGATIVE mg/dL   Hgb urine dipstick NEGATIVE NEGATIVE   Bilirubin Urine NEGATIVE NEGATIVE   Ketones, ur NEGATIVE NEGATIVE mg/dL   Protein, ur NEGATIVE NEGATIVE mg/dL   Urobilinogen, UA 0.2 0.0 - 1.0 mg/dL   Nitrite NEGATIVE NEGATIVE   Leukocytes, UA NEGATIVE NEGATIVE  CBG monitoring, ED     Status: Abnormal   Collection Time: 01/18/15  8:18 AM  Result Value Ref Range   Glucose-Capillary 100 (H) 65 - 99 mg/dL   Dg Chest 2 View  01/18/2015   CLINICAL DATA:  Chest pain  EXAM: CHEST  2 VIEW  COMPARISON:  None.  FINDINGS: There is mild left base atelectatic change. Lungs elsewhere clear. Heart size and pulmonary vascularity are normal. No adenopathy. No bone lesions.  IMPRESSION: Mild left base atelectasis.  No edema or consolidation.   Electronically Signed   By: Lowella Grip III M.D.   On: 01/18/2015 01:43    ECG: sinus rhythm at 71 bpm.  L axis deviation.  Incomplete RBBB.  Anterolateral infarct, age indeterminate.   TTE 03/2012: EF 11-91%, grade 1 diastolic dysfunction.  Trivial AR.   ASSESSMENT: 14F with DM type II, HTN, HL, CVA and hypothyroidism who presents with chest pain, nausea and vomiting.  Given her recent travel and elevated d-dimer, agree with ruling out PE.  If this is negative, she will need evaluation for coronary disease.   PLAN/DISCUSSION: - Will follow up CT PE protocol -  Agree with TTE - If negative, will need Lexiscan Myoview - Continue ASA, bisoprolol, clopidogrel, colesevelam,  - Would hold losartan in setting of AKI, especially given that she is getting contrast for CT - titrate bisoprolol or use prn hydralazine for BP control - judicious use of NTG given concern for PE and RV failure - patient intolerant of statins    Signed: Sharol Harness 01/18/2015, 11:10 AM

## 2015-01-18 NOTE — H&P (Addendum)
[facility] History and Physical  Whitney Newton:[id number] DOB: 14-Feb-1941 DOA: 01/18/2015  Referring physician: Dr. Roxanne Mins. PCP: Donnie Coffin, MD  Specialists: Dr. Mare Ferrari. Cardiologist.  Chief Complaint: Chest pain.  HPI: Whitney Newton is a 74 y.o. female with history of diabetes mellitus type 2, hypertension, CVA, hyperlipidemia, OSA and history of sarcoma of the spine presents to the ER because of chest pain. Patient was recently back from [place] on August 8. After which patient started developing some heartburn and nausea vomiting. History patient started developing chest pressure last evening mainly on exertion. Also mild shortness of breath and also experience some swelling of the legs. Patient chest pain improves with rest. Last night and chest pain worsened patient came to ER and pain was relieved by sublingual nitroglycerin. Presently chest pain-free. Cardiac markers were negative chest x-ray was unremarkable EKG was showing nonspecific findings. Patient has been admitted for further management of chest pain. In addition patient's labs reveal acute renal failure.   Review of Systems: As presented in the history of presenting illness, rest negative.  Past Medical History  Diagnosis Date  . Hypertension   . Thyroid disease     hypothyroidism  . Gastritis   . Diverticulitis   . Back pain     prior back surgery in 2009 - slow to recover  . Diabetes   . HLD (hyperlipidemia)     statin intolerant  . Normal cardiac stress test 2011  . Obesity   . Hemangioma of liver     managed conservatively; followed at [facility]   Past Surgical History  Procedure Laterality Date  . Abdominal hysterectomy    . Nasal reconstruction    . Appendectomy     Social History:  reports that she has never smoked. She has never used smokeless tobacco. She reports that she does not drink alcohol or use illicit drugs. Where does patient live home. Can patient participate in ADLs?  Yes.  Allergies  Allergen Reactions  . Celecoxib Shortness Of Breath  . Ropinirole Hcl Other (See Comments)    Restless legs syndrome.  . Actos [Pioglitazone Hydrochloride]     swelling  . Crestor [Rosuvastatin Calcium]     LEG PAIN  . Doxycycline     gastritis  . Fenofibrate Other (See Comments)    unknown  . Imdur [Isosorbide]     headache  . Lipitor [Atorvastatin Calcium]     CHEST PAIN, SOB  . Metformin And Related Other (See Comments)    unknown  . Niaspan [Niacin Er]     RASH  . Pioglitazone Swelling  . Pravachol Other (See Comments)    Leg pain  . Ranolazine Er     EDEMA  . Rosuvastatin Other (See Comments)    LEG PAIN  . Zetia [Ezetimibe]     NO SLEEP, SOB    Family History:  Family History  Problem Relation Age of Onset  . Heart disease Father   . Heart disease Brother       Prior to Admission medications   Medication Sig Start Date End Date Taking? Authorizing Provider  aspirin 81 MG tablet Take 81 mg by mouth daily.   Yes Historical Provider, MD  bisoprolol-hydrochlorothiazide (ZIAC) 10-6.25 MG per tablet Take 1 tablet by mouth daily.   Yes Historical Provider, MD  clopidogrel (PLAVIX) 75 MG tablet Take 75 mg by mouth daily.   Yes Historical Provider, MD  colesevelam (WELCHOL) 625 MG tablet Take 1,875 mg by mouth 2 (two)  times daily as needed (cholesterol).    Yes Historical Provider, MD  cyclobenzaprine (FLEXERIL) 5 MG tablet Take 2.5 mg by mouth daily as needed (muscle spasms).    Yes Historical Provider, MD  furosemide (LASIX) 40 MG tablet Take 1 tablet (40 mg total) by mouth daily as needed for fluid. 08/02/14  Yes Darlin Coco, MD  gabapentin (NEURONTIN) 100 MG capsule Take 200 mg by mouth daily as needed (nerve pain).  09/02/11  Yes Historical Provider, MD  glucose blood ([facility] BLOOD GLUCOSE TEST STRIPS) test strip Used to check Blood Sugar 4 times daily. Dx. Code 250.00 12/03/13  Yes Historical Provider, MD  hydrochlorothiazide (HYDRODIURIL) 25 MG  tablet Take 6.25 mg by mouth daily. Take 1/4 tablet by mouth daily   Yes Historical Provider, MD  hyoscyamine (LEVBID) 0.375 MG 12 hr tablet Take 0.375 mg by mouth every 12 (twelve) hours as needed (cramps).    Yes Historical Provider, MD  insulin glargine (LANTUS) 100 UNIT/ML injection Inject 32 Units into the skin every morning.  06/15/12  Yes Darlin Coco, MD  insulin lispro (HUMALOG KWIKPEN) 100 UNIT/ML KiwkPen Inject 6-10 Units into the skin 3 (three) times daily. 6 units with breakfast, 6 units with lunch and 10 units with dinner   Yes Historical Provider, MD  Lancets (ACCU-CHEK MULTICLIX) lancets Used to check Blood Sugar 4 times daily. Dx. Code 250.00. 12/03/13  Yes Historical Provider, MD  levothyroxine (SYNTHROID, LEVOTHROID) 125 MCG tablet Take 125 mcg by mouth daily. 07/28/14  Yes Historical Provider, MD  losartan (COZAAR) 25 MG tablet Take 25 mg by mouth daily.   Yes Historical Provider, MD  nitroGLYCERIN (NITROSTAT) 0.4 MG SL tablet DISSOLVE ONE TABLET UNDER TONGUE AS NEEDED FOR ARM/CHEST PAIN. 08/02/14  Yes Darlin Coco, MD  Omeprazole-Sodium Bicarbonate (ZEGERID PO) Take 1 tablet by mouth daily.    Yes Historical Provider, MD  potassium chloride SA (K-DUR,KLOR-CON) 20 MEQ tablet Take 20 mEq by mouth 3 (three) times a week. TAke one tablet by mouth on Monday, Wednesday, and Friday or as directed   Yes Historical Provider, MD    Physical Exam: Filed Vitals:   01/18/15 0300 01/18/15 0330 01/18/15 0404 01/18/15 0405  BP: 154/62 155/47 169/49   Pulse: 61 57  64  Temp:      TempSrc:      Resp: 15 15  20   Height:      Weight:      SpO2: 96% 96%  99%     General:  Obese not in distress.  Eyes: Anicteric no pallor.  ENT: No discharge from the ears eyes nose a month.  Neck: No mass felt.  Cardiovascular: S1-S2 heard.  Respiratory: No rhonchi or crepitations.  Abdomen: Soft nontender bowel sounds present.  Skin: No rash.  Musculoskeletal: Mild edema.  Psychiatric:  Appears normal.  Neurologic: Alert awake oriented to time place and person. Moves all extremities.  Labs on Admission:  Basic Metabolic Panel:  Recent Labs Lab 01/17/15 2346  NA 139  K 3.5  CL 101  CO2 29  GLUCOSE 170*  BUN 19  CREATININE 1.36*  CALCIUM 9.7   Liver Function Tests: No results for input(s): AST, ALT, ALKPHOS, BILITOT, PROT, ALBUMIN in the last 168 hours. No results for input(s): LIPASE, AMYLASE in the last 168 hours. No results for input(s): AMMONIA in the last 168 hours. CBC:  Recent Labs Lab 01/17/15 2346  WBC 8.0  NEUTROABS 4.1  HGB 14.6  HCT 42.2  MCV 88.3  PLT  234   Cardiac Enzymes:  Recent Labs Lab 01/17/15 2346  TROPONINI <0.03    BNP (last 3 results) No results for input(s): BNP in the last 8760 hours.  ProBNP (last 3 results) No results for input(s): PROBNP in the last 8760 hours.  CBG:  Recent Labs Lab 01/18/15 0257  GLUCAP 120*    Radiological Exams on Admission: Dg Chest 2 View  01/18/2015   CLINICAL DATA:  Chest pain  EXAM: CHEST  2 VIEW  COMPARISON:  None.  FINDINGS: There is mild left base atelectatic change. Lungs elsewhere clear. Heart size and pulmonary vascularity are normal. No adenopathy. No bone lesions.  IMPRESSION: Mild left base atelectasis.  No edema or consolidation.   Electronically Signed   By: Lowella Grip III M.D.   On: 01/18/2015 01:43    EKG: Independently reviewed. Normal sinus rhythm with incomplete RBBB.  Assessment/Plan Principal Problem:   Chest pain Active Problems:   Hypothyroidism   Acute kidney injury (nontraumatic)   Diabetes mellitus type 2, controlled   1. Chest pain - patient has had an abnormal stress test in 2013. But given patient's risk factors including diabetes hypertension hyperlipidemia and previous stroke we will cycle cardiac markers to rule out ACS. D-dimer is pending. If positive will get a VQ scan and Dopplers of the lower extremities. Since patient has acute renal  failure we will hold off CT angiogram. Patient is on aspirin and Plavix. When necessary nitroglycerin. Check 2-D echo. 2. Acute renal failure - probably from nausea vomiting. Hold HCTZ and gently hydrate. Patient has not recently taken any Lasix though it was mentioned in the medication list. Continue gentle hydration and follow metabolic panel. If creatinine does not improve may have to hold Cozaar. Check urine analysis. 3. Diabetes mellitus type 2 - continue home medications. 4. Hypothyroidism on Synthroid. 5. Hypertension - holding HCTZ due to #2. Continue other medications. When necessary nitroglycerin and hydralazine. 6. OSA noncompliant with CPAP. 7. History of CVA on aspirin and Plavix.  I have reviewed patient's old charts labs and personally reviewed chest x-ray and EKG.   DVT Prophylaxis Lovenox.  Code Status: Full code.  Family Communication: Patient's husband.  Disposition Plan: Admit for observation.    KAKRAKANDY,ARSHAD N. [facility] Pager [phone].  If 7PM-7AM, please contact night-coverage [email or web address] Password [email or web address] 01/18/2015, 4:10 AM

## 2015-01-18 NOTE — Progress Notes (Signed)
Patient seen and examined. Admitted after midnight secondary to CP. Presentation suggested pleuritic chest discomfort, but CT angio neg for PE. CXR has r/o PNA and patient with heart score of 4-5. Cardiology consulted and recommended Myoview. Please referred to H&P written by Dr. Hal Hope for further info/details on admission. Patient currently CP free and denies SOB. Troponin neg X 3.   Plan: -follow 2-D echo -Myoview in am -continue ASA, plavix, b-blocker and Welchol -PRN NTG for further CP -will follow cardiology rec's -continue PPI and treatment of further risk factors    Barton Dubois [phone]

## 2015-01-18 NOTE — ED Notes (Signed)
Dr Hal Hope notified of DDimer result.

## 2015-01-18 NOTE — ED Notes (Signed)
Patient requesting her gabapentin for nerve pain, states that her RLS is acting up.  Notified pharmacy.

## 2015-01-18 NOTE — ED Provider Notes (Signed)
CSN: [id number]     Arrival date & time 01/17/15  2322 History  This chart was scribed for Whitney Fuel, MD by Randa Evens, ED Scribe. This patient was seen in room D33C/D33C and the patient's care was started at 2:04 AM.    Chief Complaint  Patient presents with  . Chest Pain    Patient is a 74 y.o. female presenting with chest pain. The history is provided by the patient. No language interpreter was used.  Chest Pain Associated symptoms: nausea, shortness of breath and vomiting   Associated symptoms: no diaphoresis    HPI Comments: REESHEMAH NAZARYAN is a 74 y.o. female who presents to the Emergency Department complaining of CP onset 1 night prior described as a pressure. Pt reports associated intermittent jaw pain, nausea, vomiting and SOB. Pt states that the pain radiates to her right arm but has resolved. Pt states she has tried zegerid with no relief. Pt has had nitroglycerin that provided temporary relief. Pt states that the pain is worse with exertion. Pt states that her symptoms would improve with resting. Pt denies diaphoresis. Pt reports recent traveling 2 days ago but states that she stopped several times to get out and stretch her legs.   Past Medical History  Diagnosis Date  . Hypertension   . Thyroid disease     hypothyroidism  . Gastritis   . Diverticulitis   . Back pain     prior back surgery in 2009 - slow to recover  . Diabetes   . HLD (hyperlipidemia)     statin intolerant  . Normal cardiac stress test 2011  . Obesity   . Hemangioma of liver     managed conservatively; followed at [facility]   Past Surgical History  Procedure Laterality Date  . Abdominal hysterectomy    . Nasal reconstruction    . Appendectomy     Family History  Problem Relation Age of Onset  . Heart disease Father   . Heart disease Brother    Social History  Substance Use Topics  . Smoking status: Never Smoker   . Smokeless tobacco: Never Used  . Alcohol Use: No   OB History    No  data available     Review of Systems  Constitutional: Negative for diaphoresis.  Respiratory: Positive for shortness of breath.   Cardiovascular: Positive for chest pain.  Gastrointestinal: Positive for nausea and vomiting.  All other systems reviewed and are negative.     Allergies  Celecoxib; Ropinirole hcl; Actos; Crestor; Doxycycline; Fenofibrate; Imdur; Lipitor; Metformin and related; Niaspan; Pioglitazone; Pravachol; Ranolazine er; Rosuvastatin; and Zetia  Home Medications   Prior to Admission medications   Medication Sig Start Date End Date Taking? Authorizing Provider  aspirin 81 MG tablet Take 81 mg by mouth daily.    Historical Provider, MD  bisoprolol-hydrochlorothiazide [facility]) 10-6.25 MG per tablet Take 1 tablet by mouth daily.    Historical Provider, MD  clopidogrel (PLAVIX) 75 MG tablet Take 75 mg by mouth daily.    Historical Provider, MD  colesevelam (WELCHOL) 625 MG tablet Take 1,875 mg by mouth as needed (cholesterol).     Historical Provider, MD  cyclobenzaprine (FLEXERIL) 5 MG tablet Take 2.5 mg by mouth daily as needed (muscle spasms).     Historical Provider, MD  furosemide (LASIX) 40 MG tablet Take 1 tablet (40 mg total) by mouth daily as needed for fluid. 08/02/14   Darlin Coco, MD  gabapentin (NEURONTIN) 100 MG capsule Take 200 mg  by mouth daily as needed (nerve pain).  09/02/11   Historical Provider, MD  glucose blood ([facility]S BLOOD GLUCOSE TEST STRIPS) test strip Used to check Blood Sugar 4 times daily. Dx. Code 250.00 12/03/13   Historical Provider, MD  hydrochlorothiazide (HYDRODIURIL) 25 MG tablet Take 25 mg by mouth daily. Take 1/4 tablet by mouth daily    Historical Provider, MD  hyoscyamine (LEVBID) 0.375 MG 12 hr tablet Take 0.375 mg by mouth every 12 (twelve) hours as needed (cramps).     Historical Provider, MD  insulin aspart (NOVOLOG FLEXPEN) 100 UNIT/ML FlexPen Inject into the skin 3 (three) times daily with meals. Inject under skin before each meal  and hold if skips meal.   AM: 6 units, NOON: 6 units, PM: 10 units 10/27/13   Historical Provider, MD  insulin glargine (LANTUS) 100 UNIT/ML injection Inject 32 Units into the skin daily.  06/15/12   Darlin Coco, MD  Lancets (ACCU-CHEK MULTICLIX) lancets Used to check Blood Sugar 4 times daily. Dx. Code 250.00. 12/03/13   Historical Provider, MD  levothyroxine (SYNTHROID, LEVOTHROID) 125 MCG tablet Take 125 mcg by mouth daily. 07/28/14   Historical Provider, MD  losartan (COZAAR) 25 MG tablet Take 25 mg by mouth daily.    Historical Provider, MD  nitroGLYCERIN (NITROSTAT) 0.4 MG SL tablet DISSOLVE ONE TABLET UNDER TONGUE AS NEEDED FOR ARM/CHEST PAIN. 08/02/14   Darlin Coco, MD  Omeprazole-Sodium Bicarbonate (ZEGERID PO) Take 1 tablet by mouth daily.     Historical Provider, MD  potassium chloride SA (K-DUR,KLOR-CON) 20 MEQ tablet Take 20 mEq by mouth. TAke one tablet by mouth on Monday, Wednesday, and Friday or as directed    Historical Provider, MD   BP 159/63 mmHg  Pulse 70  Temp(Src) 97.9 F (36.6 C) (Oral)  Resp 18  Ht 5\' 3"  (1.6 m)  Wt 194 lb 3.2 oz (88.089 kg)  BMI 34.41 kg/m2  SpO2 95%   Physical Exam  Constitutional: She is oriented to person, place, and time. She appears well-developed and well-nourished. No distress.  HENT:  Head: Normocephalic and atraumatic.  Eyes: Conjunctivae and EOM are normal. Pupils are equal, round, and reactive to light.  Neck: Normal range of motion. Neck supple. No JVD present.  Cardiovascular: Normal rate, regular rhythm and normal heart sounds.   No murmur heard. Pulmonary/Chest: Effort normal and breath sounds normal. She has no wheezes. She has no rales. She exhibits no tenderness.  Abdominal: Soft. Bowel sounds are normal. She exhibits no distension and no mass. There is no tenderness.  Musculoskeletal: Normal range of motion. She exhibits edema.  1+ pitting edema bilaterally.   Lymphadenopathy:    She has no cervical adenopathy.   Neurological: She is alert and oriented to person, place, and time. No cranial nerve deficit. She exhibits normal muscle tone. Coordination normal.  Skin: Skin is warm and dry. No rash noted.  Psychiatric: She has a normal mood and affect. Her behavior is normal. Judgment and thought content normal.  Nursing note and vitals reviewed.   ED Course  Procedures (including critical care time) DIAGNOSTIC STUDIES: Oxygen Saturation is 95% on RA, adequate by my interpretation.    COORDINATION OF CARE: 2:12 AM-Discussed treatment plan with pt at bedside and pt agreed to plan.     Labs Review Results for orders placed or performed during the hospital encounter of 31/51/76  Basic metabolic panel  Result Value Ref Range   Sodium 139 135 - 145 mmol/L   Potassium 3.5  3.5 - 5.1 mmol/L   Chloride 101 101 - 111 mmol/L   CO2 29 22 - 32 mmol/L   Glucose, Bld 170 (H) 65 - 99 mg/dL   BUN 19 6 - 20 mg/dL   Creatinine, Ser 1.36 (H) 0.44 - 1.00 mg/dL   Calcium 9.7 8.9 - 10.3 mg/dL   GFR calc non Af Amer 37 (L) >60 mL/min   GFR calc Af Amer 43 (L) >60 mL/min   Anion gap 9 5 - 15  Troponin I  Result Value Ref Range   Troponin I <0.03 <0.031 ng/mL  CBC with Differential  Result Value Ref Range   WBC 8.0 4.0 - 10.5 K/uL   RBC 4.78 3.87 - 5.11 MIL/uL   Hemoglobin 14.6 12.0 - 15.0 g/dL   HCT 42.2 36.0 - 46.0 %   MCV 88.3 78.0 - 100.0 fL   MCH 30.5 26.0 - 34.0 pg   MCHC 34.6 30.0 - 36.0 g/dL   RDW 12.9 11.5 - 15.5 %   Platelets 234 150 - 400 K/uL   Neutrophils Relative % 52 43 - 77 %   Neutro Abs 4.1 1.7 - 7.7 K/uL   Lymphocytes Relative 38 12 - 46 %   Lymphs Abs 3.0 0.7 - 4.0 K/uL   Monocytes Relative 7 3 - 12 %   Monocytes Absolute 0.6 0.1 - 1.0 K/uL   Eosinophils Relative 3 0 - 5 %   Eosinophils Absolute 0.2 0.0 - 0.7 K/uL   Basophils Relative 0 0 - 1 %   Basophils Absolute 0.0 0.0 - 0.1 K/uL  D-dimer, quantitative (not at [facility])  Result Value Ref Range   D-Dimer, Quant 3.09 (H) 0.00 -  0.48 ug/mL-FEU  CBG monitoring, ED  Result Value Ref Range   Glucose-Capillary 120 (H) 65 - 99 mg/dL   Imaging Review Dg Chest 2 View  01/18/2015   CLINICAL DATA:  Chest pain  EXAM: CHEST  2 VIEW  COMPARISON:  None.  FINDINGS: There is mild left base atelectatic change. Lungs elsewhere clear. Heart size and pulmonary vascularity are normal. No adenopathy. No bone lesions.  IMPRESSION: Mild left base atelectasis.  No edema or consolidation.   Electronically Signed   By: Lowella Grip III M.D.   On: 01/18/2015 01:43     EKG Interpretation   Date/Time:  Tuesday January 17 2015 23:29:29 EDT Ventricular Rate:  71 PR Interval:  176 QRS Duration: 100 QT Interval:  398 QTC Calculation: 432 R Axis:   -30 Text Interpretation:  Normal sinus rhythm Left axis deviation Incomplete  right bundle branch block Inferior infarct , age undetermined  Anterolateral infarct , age undetermined Abnormal ECG When compared with  ECG of 03/17/2012, No significant change was found Confirmed by [facility]  MD,  Sarann Tregre ([id number]) on 01/18/2015 1:59:34 AM      MDM   Final diagnoses:  Chest pain, unspecified chest pain type  Exertional dyspnea  Acute kidney injury (nontraumatic)    Chest pain with apparent exertional component worrisome for angina pectoris. Stable peripheral edema. She does have risk factor for DVT and pulmonary embolism given recent long distance car ride. Laboratory workup but shows elevation of creatinine compared with baseline. D-dimer has been ordered. Case has been discussed with Dr. Hal Hope of [facility] who agrees to admit the patient.   I personally performed the services described in this documentation, which was scribed in my presence. The recorded information has been reviewed and is accurate.  Whitney Fuel, MD 81/01/75 1025

## 2015-01-18 NOTE — Progress Notes (Signed)
Preliminary results by tech - Bilateral Venous Duplex Completed. Negative for deep and superficial vein thrombosis in both lower extremities.  Oda Cogan, BS, RDMS, RVT

## 2015-01-18 NOTE — ED Notes (Signed)
Pt still in CT

## 2015-01-18 NOTE — Progress Notes (Signed)
CT PE protocol negative for PE.  Will order [employer].

## 2015-01-19 ENCOUNTER — Inpatient Hospital Stay (HOSPITAL_COMMUNITY): Payer: Medicare Other

## 2015-01-19 DIAGNOSIS — R079 Chest pain, unspecified: Secondary | ICD-10-CM

## 2015-01-19 DIAGNOSIS — D1803 Hemangioma of intra-abdominal structures: Secondary | ICD-10-CM | POA: Insufficient documentation

## 2015-01-19 DIAGNOSIS — E119 Type 2 diabetes mellitus without complications: Secondary | ICD-10-CM | POA: Diagnosis not present

## 2015-01-19 DIAGNOSIS — N179 Acute kidney failure, unspecified: Principal | ICD-10-CM

## 2015-01-19 DIAGNOSIS — K219 Gastro-esophageal reflux disease without esophagitis: Secondary | ICD-10-CM | POA: Insufficient documentation

## 2015-01-19 DIAGNOSIS — E039 Hypothyroidism, unspecified: Secondary | ICD-10-CM | POA: Diagnosis not present

## 2015-01-19 DIAGNOSIS — I10 Essential (primary) hypertension: Secondary | ICD-10-CM | POA: Insufficient documentation

## 2015-01-19 DIAGNOSIS — I209 Angina pectoris, unspecified: Secondary | ICD-10-CM | POA: Diagnosis not present

## 2015-01-19 LAB — NM MYOCAR MULTI W/SPECT W/WALL MOTION / EF
CSEPPHR: 100 {beats}/min
LHR: 0.03
LV dias vol: 53 mL
LV sys vol: 1 mL
Rest HR: 66 {beats}/min
SDS: 1
SRS: 6
SSS: 7
TID: 1.06

## 2015-01-19 LAB — GLUCOSE, CAPILLARY
GLUCOSE-CAPILLARY: 148 mg/dL — AB (ref 65–99)
GLUCOSE-CAPILLARY: 233 mg/dL — AB (ref 65–99)
Glucose-Capillary: 214 mg/dL — ABNORMAL HIGH (ref 65–99)
Glucose-Capillary: 161 mg/dL — ABNORMAL HIGH (ref 65–99)

## 2015-01-19 LAB — BRAIN NATRIURETIC PEPTIDE: B NATRIURETIC PEPTIDE 5: 44.4 pg/mL (ref 0.0–100.0)

## 2015-01-19 MED ORDER — HYDRALAZINE HCL 20 MG/ML IJ SOLN
10.0000 mg | Freq: Four times a day (QID) | INTRAMUSCULAR | Status: DC | PRN
  Administered 2015-01-19: 10 mg via INTRAVENOUS
  Filled 2015-01-19: qty 1

## 2015-01-19 MED ORDER — TECHNETIUM TC 99M SESTAMIBI GENERIC - CARDIOLITE
30.0000 | Freq: Once | INTRAVENOUS | Status: AC | PRN
  Administered 2015-01-19: 30 via INTRAVENOUS

## 2015-01-19 MED ORDER — SODIUM CHLORIDE 0.9 % IJ SOLN
80.0000 mg | INTRAMUSCULAR | Status: AC
  Administered 2015-01-19: 80 mg via INTRAVENOUS

## 2015-01-19 MED ORDER — HYDRALAZINE HCL 20 MG/ML IJ SOLN
INTRAMUSCULAR | Status: AC
  Filled 2015-01-19: qty 1

## 2015-01-19 MED ORDER — HYDRALAZINE HCL 20 MG/ML IJ SOLN
INTRAMUSCULAR | Status: AC
  Administered 2015-01-19: 10 mg via INTRAVENOUS
  Filled 2015-01-19: qty 1

## 2015-01-19 MED ORDER — HYDRALAZINE HCL 20 MG/ML IJ SOLN
10.0000 mg | Freq: Once | INTRAMUSCULAR | Status: AC
  Administered 2015-01-19: 10 mg via INTRAVENOUS

## 2015-01-19 MED ORDER — RANITIDINE HCL 150 MG PO TABS: 150.0000 mg | ORAL_TABLET | Freq: Every day | ORAL | Status: DC

## 2015-01-19 MED ORDER — OMEPRAZOLE 20 MG PO CPDR
40.0000 mg | DELAYED_RELEASE_CAPSULE | Freq: Every day | ORAL | Status: DC
Start: 2015-01-19 — End: 2015-07-13

## 2015-01-19 MED ORDER — TECHNETIUM TC 99M SESTAMIBI GENERIC - CARDIOLITE
10.0000 | Freq: Once | INTRAVENOUS | Status: AC | PRN
  Administered 2015-01-19: 10 via INTRAVENOUS

## 2015-01-19 MED ORDER — REGADENOSON 0.4 MG/5ML IV SOLN
0.4000 mg | Freq: Once | INTRAVENOUS | Status: AC
Start: 2015-01-19 — End: 2015-01-19
  Administered 2015-01-19: 0.4 mg via INTRAVENOUS
  Filled 2015-01-19: qty 5

## 2015-01-19 NOTE — Progress Notes (Signed)
Orders received for pt discharge.  Discharge summary printed and reviewed with pt.  Explained medication regimen, and pt had no further questions at this time.  IV removed and site remains clean, dry, intact.  Telemetry removed.  Pt in stable condition and awaiting transport. 

## 2015-01-19 NOTE — Discharge Summary (Signed)
Physician Discharge Summary  Whitney Newton RSW:[id number] DOB: [birth year] DOA: 01/18/2015  PCP: Donnie Coffin, MD  Admit date: 01/18/2015 Discharge date: 01/19/2015  Time spent: >  30 minutes  Recommendations for Outpatient Follow-up:  1. Repeat BMET to follow electrolytes and renal function 2. Reassess BP and adjust antihypertensive agents as needed   Discharge Diagnoses:  Chest pain GERD HTN Dyslipidemia AKI Hypothyroidism Diabetes mellitus type 2, controlled  Discharge Condition: stable and improved. Patient discharge home with instruction to follow with cardiology service (office will contact her with appointment details) and PCP in 2 weeks.  Diet recommendation: low sodium and low carbohydrates   Filed Weights   01/17/15 2334 01/18/15 1409 01/19/15 0538  Weight: 88.089 kg (194 lb 3.2 oz) 88.9 kg (195 lb 15.8 oz) 89.495 kg (197 lb 4.8 oz)    History of present illness:  74 y.o. female with history of diabetes mellitus type 2, hypertension, CVA, hyperlipidemia, OSA and history of sarcoma of the spine presents to the ER because of chest pain. Patient was recently back from [place] on August 8. After which patient, started developing some heartburn and nausea/vomiting. Patient endorses developing some chest pressure evening prior to admission and the pain was present mainly on exertion. Also mild shortness of breath and some swelling of her legs. Patient chest pain improved with rest. In the ER pain was relieved by sublingual nitroglycerin.  On exam at time of admission she was chest pain-free. Cardiac markers were negative chest x-ray was unremarkable EKG was showing nonspecific findings. Patient has been admitted for further management of chest pain. In addition patient's labs reveal AKI  Hospital Course:  1-chest pain: non cardiac. Most likely due to GERD -patient with neg troponin X3 and no ischemic changes on EKG or telemetry -given heart score of 4-5 she had Myoview lexican  (which demonstrated low risk prob for ischemia) -also had 2-D echo: preserved EF, grade diastolic HF and no wall motion abnormalities -patient will continue medical management for her risk factors and will follow by Dr. Mare Ferrari as an outpatient -advise to follow heart healthy/low sodium diet -will continue ASA/Plavix  2-chronic diastolic heart failure: compensated -EF preserved (65-70%) -advise to watch weight on daily basis and to follow low sodium diet  3-GERD: will discharge on omeprazole and QHS Zantac -lifestyle changes discussed with patient  4-AKI: due to continue use of nephrotoxic agents and mild dehydration -resolved with IVF's -will recommend follow up of her renal function -home medication regimen resume at discharge -patient advise to maintain good hydration  5-HTN: will continue home antihypertensive regimen -advise to follow low sodium diet  6-Diabetes type 2: will continue insulin -patient advise to control amount of carbohydrates intake  7-hypothyroidism: continue synthroid  8-Dyslipidemia: will continue Welchol   9-Hx of stroke: no neurologic changes during hospitalization -continue risk factors modification and secondary prevention with ASA and plavix.   Procedures:  CT angio chest: no PE and stable findings of known lung nodule and hepatic hemangioma   Myoview: low risk probability for ischemia   LE duplex: - No evidence of deep vein or superficial thrombosis involving the  right lower extremity and left lower extremity. - No evidence of Baker&'s cyst on the right or left.   2-D echo: - Left ventricle: The cavity size was normal. Wall thickness was increased in a pattern of moderate LVH. There was focal basal hypertrophy. Systolic function was vigorous. The estimated ejection fraction was in the range of 65% to 70%. Wall motion was  normal; there were no regional wall motion abnormalities. Doppler parameters are consistent with  abnormal left ventricular relaxation (grade 1 diastolic dysfunction). - Right atrium: The atrium was mildly dilated.  Consultations:  Cardiology   Discharge Exam: Filed Vitals:   01/19/15 1407  BP: 133/44  Pulse: 79  Temp: 98.5 F (36.9 C)  Resp: 20    General: Afebrile, no further CP, no nausea or vomiting and denies SOB Cardiovascular: S1 and S2, no rubs or gallops; no murmurs  Respiratory: good air movement, no wheezing Abd: soft, NT, ND, positive BS  Discharge Instructions   Discharge Instructions    Diet - low sodium heart healthy    Complete by:  As directed      Discharge instructions    Complete by:  As directed   Follow low sodium diet (less than 2.5 gram daily) Take medications as prescribed Please arrange follow up with PCP in 2 weeks Follow with Cardiology as instructed (office will contact you with appointment details) Follow low carbohydrates diet Follow GERD instructions for lifestyle changes          Current Discharge Medication List    START taking these medications   Details  omeprazole (PRILOSEC) 20 MG capsule Take 2 capsules (40 mg total) by mouth daily. Qty: 60 capsule, Refills: 1    ranitidine (ZANTAC) 150 MG tablet Take 1 tablet (150 mg total) by mouth at bedtime. Qty: 30 tablet, Refills: 1      CONTINUE these medications which have NOT CHANGED   Details  aspirin 81 MG tablet Take 81 mg by mouth daily.    bisoprolol-hydrochlorothiazide (ZIAC) 10-6.25 MG per tablet Take 1 tablet by mouth daily.    clopidogrel (PLAVIX) 75 MG tablet Take 75 mg by mouth daily.    colesevelam (WELCHOL) 625 MG tablet Take 1,875 mg by mouth 2 (two) times daily as needed (cholesterol).     cyclobenzaprine (FLEXERIL) 5 MG tablet Take 2.5 mg by mouth daily as needed (muscle spasms).     furosemide (LASIX) 40 MG tablet Take 1 tablet (40 mg total) by mouth daily as needed for fluid. Qty: 30 tablet, Refills: 3    gabapentin (NEURONTIN) 100 MG capsule Take  200 mg by mouth daily as needed (nerve pain).     glucose blood ([employer] BLOOD GLUCOSE TEST STRIPS) test strip Used to check Blood Sugar 4 times daily. Dx. Code 250.00    hyoscyamine (LEVBID) 0.375 MG 12 hr tablet Take 0.375 mg by mouth every 12 (twelve) hours as needed (cramps).     insulin glargine (LANTUS) 100 UNIT/ML injection Inject 32 Units into the skin every morning.     insulin lispro (HUMALOG KWIKPEN) 100 UNIT/ML KiwkPen Inject 6-10 Units into the skin 3 (three) times daily. 6 units with breakfast, 6 units with lunch and 10 units with dinner    Lancets (ACCU-CHEK MULTICLIX) lancets Used to check Blood Sugar 4 times daily. Dx. Code 250.00.    levothyroxine (SYNTHROID, LEVOTHROID) 125 MCG tablet Take 125 mcg by mouth daily.    losartan (COZAAR) 25 MG tablet Take 25 mg by mouth daily.    nitroGLYCERIN (NITROSTAT) 0.4 MG SL tablet DISSOLVE ONE TABLET UNDER TONGUE AS NEEDED FOR ARM/CHEST PAIN. Qty: 25 tablet, Refills: PRN    potassium chloride SA (K-DUR,KLOR-CON) 20 MEQ tablet Take 20 mEq by mouth 3 (three) times a week. TAke one tablet by mouth on Monday, Wednesday, and Friday or as directed      STOP taking these medications  hydrochlorothiazide (HYDRODIURIL) 25 MG tablet      Omeprazole-Sodium Bicarbonate (ZEGERID PO)        Allergies  Allergen Reactions  . Celecoxib Shortness Of Breath  . Ropinirole Hcl Other (See Comments)    Restless legs syndrome.  . Actos [Pioglitazone Hydrochloride]     swelling  . Crestor [Rosuvastatin Calcium]     LEG PAIN  . Doxycycline     gastritis  . Fenofibrate Other (See Comments)    unknown  . Imdur [Isosorbide]     headache  . Lipitor [Atorvastatin Calcium]     CHEST PAIN, SOB  . Metformin And Related Other (See Comments)    unknown  . Niaspan [Niacin Er]     RASH  . Pioglitazone Swelling  . Pravachol Other (See Comments)    Leg pain  . Ranolazine Er     EDEMA  . Rosuvastatin Other (See Comments)    LEG PAIN  . Zetia  [Ezetimibe]     NO SLEEP, SOB   Follow-up Information    Follow up with Donnie Coffin, MD. Schedule an appointment as soon as possible for a visit in 2 weeks.   Specialty:  Family Medicine   Contact information:   [address] [place] [address] [phone]       Follow up with Warren Danes, MD.   Specialty:  Cardiology   Why:  office will contact you with appointment details   Contact information:   [address] [place] [address] [phone]       The results of significant diagnostics from this hospitalization (including imaging, microbiology, ancillary and laboratory) are listed below for reference.    Significant Diagnostic Studies: Dg Chest 2 View  01/18/2015   CLINICAL DATA:  Chest pain  EXAM: CHEST  2 VIEW  COMPARISON:  None.  FINDINGS: There is mild left base atelectatic change. Lungs elsewhere clear. Heart size and pulmonary vascularity are normal. No adenopathy. No bone lesions.  IMPRESSION: Mild left base atelectasis.  No edema or consolidation.   Electronically Signed   By: Lowella Grip III M.D.   On: 01/18/2015 01:43   Ct Angio Chest Pe W/cm &/or Wo Cm  01/18/2015   CLINICAL DATA:  Chest pain and short of breath. History of multiple myeloma  EXAM: CT ANGIOGRAPHY CHEST WITH CONTRAST  TECHNIQUE: Multidetector CT imaging of the chest was performed using the standard protocol during bolus administration of intravenous contrast. Multiplanar CT image reconstructions and MIPs were obtained to evaluate the vascular anatomy.  CONTRAST:  27m OMNIPAQUE IOHEXOL 350 MG/ML SOLN  COMPARISON:  CT chest 06/26/2011.  Chest x-ray 01/18/2015  FINDINGS: Negative for pulmonary embolism.  Atherosclerotic thoracic aorta without aneurysm or dissection. Mild coronary calcification. Heart size normal. No pericardial effusion.  4 mm nodule along the minor fissure unchanged, axial image 42  6 mm right upper lobe nodule above the minor fissure unchanged,  axial image 39  4 mm right lower lobe nodule anteriorly unchanged, axial image 54  Right lower lobe scarring laterally unchanged. Elevated right hemidiaphragm unchanged.  Negative for pneumonia.  No pleural effusion identified.  Negative for mass or adenopathy in the chest.  Large hepatic mass measuring 7 x 9 cm compatible with hemangioma unchanged from the prior study. Left adrenal nodule 19 mm stable.  Advanced degenerative change and fusion of T10 and T11 is unchanged and may be due to chronic spinal infection. No acute bone lesion or fracture.  Review  of the MIP images confirms the above findings.  IMPRESSION: Negative for pulmonary embolism. No acute abnormality in the chest. Stable small right lung nodules compared with 2013.  Large hepatic hemangioma is stable.   Electronically Signed   By: Franchot Gallo M.D.   On: 01/18/2015 12:06   Nm Myocar Multi W/spect W/wall Motion / Ef  01/19/2015    There was no ST segment deviation noted during stress.  T wave inversion was noted during stress in the V2, V3, III and aVF  leads.  Defect 1: There is a small defect of mild severity.  This is a low risk study.  Nuclear stress EF: 85%.   Small, moderate intensity basal to mid inferolateral fixed defect which is  likely artifact. No reversible ischemia. LVEF 85% with normal wall motion.  This is a low risk study.   Labs: Basic Metabolic Panel:  Recent Labs Lab 01/17/15 2346 01/18/15 0601  NA 139  --   K 3.5  --   CL 101  --   CO2 29  --   GLUCOSE 170*  --   BUN 19  --   CREATININE 1.36* 0.94  CALCIUM 9.7  --    CBC:  Recent Labs Lab 01/17/15 2346 01/18/15 0601  WBC 8.0 7.4  NEUTROABS 4.1  --   HGB 14.6 14.2  HCT 42.2 40.9  MCV 88.3 88.3  PLT 234 236   Cardiac Enzymes:  Recent Labs Lab 01/17/15 2346 01/18/15 0601 01/18/15 1045 01/18/15 1540  TROPONINI <0.03 <0.03 <0.03 <0.03   BNP: BNP (last 3 results)  Recent Labs  01/19/15 0524  BNP 44.4   CBG:  Recent Labs Lab  01/18/15 2030 01/19/15 0654 01/19/15 1147 01/19/15 1309 01/19/15 1634  GLUCAP 276* 214* 148* 161* 233*    Signed:  Barton Dubois  [facility] 01/19/2015, 6:04 PM

## 2015-01-19 NOTE — Progress Notes (Signed)
    Subjective: No CP since yesterday  Objective: Vital signs in last 24 hours: Temp:  [98 F (36.7 C)-98.3 F (36.8 C)] 98.2 F (36.8 C) (08/11 0538) Pulse Rate:  [65-73] 65 (08/11 0859) Resp:  [14-20] 20 (08/11 0538) BP: (137-212)/(53-90) 212/90 mmHg (08/11 0859) SpO2:  [99 %-100 %] 99 % (08/11 0538) Weight:  [195 lb 15.8 oz (88.9 kg)-197 lb 4.8 oz (89.495 kg)] 197 lb 4.8 oz (89.495 kg) (08/11 0538) Last BM Date: 01/18/15  Intake/Output from previous day: 08/10 0701 - 08/11 0700 In: 1448.8 [P.O.:475; I.V.:973.8] Out: 1100 [Urine:1100] Intake/Output this shift:    Medications Scheduled Meds: . aspirin  81 mg Oral Daily  . bisoprolol  10 mg Oral Daily  . clopidogrel  75 mg Oral Daily  . enoxaparin (LOVENOX) injection  40 mg Subcutaneous Q24H  . insulin aspart  0-9 Units Subcutaneous TID WC  . insulin glargine  32 Units Subcutaneous BH-q7a  . levothyroxine  125 mcg Oral QAC breakfast  . losartan  25 mg Oral Daily  . pantoprazole sodium  40 mg Per Tube Daily   Continuous Infusions:  PRN Meds:.acetaminophen, colesevelam, cyclobenzaprine, gabapentin, hyoscyamine, morphine injection, nitroGLYCERIN, ondansetron (ZOFRAN) IV  PE: General appearance: alert, cooperative and no distress Lungs: clear to auscultation bilaterally Heart: regular rate and rhythm, S1, S2 normal, no murmur, click, rub or gallop Extremities: No LEE Pulses: 2+ and symmetric Skin: Warm and dry Neurologic: Grossly normal  Lab Results:   Recent Labs  01/17/15 2346 01/18/15 0601  WBC 8.0 7.4  HGB 14.6 14.2  HCT 42.2 40.9  PLT 234 236   BMET  Recent Labs  01/17/15 2346 01/18/15 0601  NA 139  --   K 3.5  --   CL 101  --   CO2 29  --   GLUCOSE 170*  --   BUN 19  --   CREATININE 1.36* 0.94  CALCIUM 9.7  --    Cardiac Panel (last 3 results)  Recent Labs  01/18/15 0601 01/18/15 1045 01/18/15 1540  TROPONINI <0.03 <0.03 <0.03    Assessment/Plan   Principal Problem:   Chest  pain Active Problems:   Hypothyroidism   Acute kidney injury (nontraumatic)   Diabetes mellitus type 2, controlled   Ruled out for MI.  Negative PE.  BP is very high.  We gave 10mg  hydralazine in nuc med and then her PO meds.   BP improved to 182/63 then the Lexiscan was started.  Additional 10mg  hydralazine given.     LOS: 1 day    HAGER, BRYAN PA-C 01/19/2015 10:02 AM  Patient is known to me.  I spoke with her and examined her down in nuclear.  She had a difficult time with the side effects of the Lexiscan.  Her images are pending.  If her Carlton Adam is normal, she may be able to be discharged later this afternoon with close office follow-up.

## 2015-01-19 NOTE — Progress Notes (Signed)
Inpatient Diabetes Program Recommendations  AACE/ADA: New Consensus Statement on Inpatient Glycemic Control (2013)  Target Ranges:  Prepandial:   less than 140 mg/dL      Peak postprandial:   less than 180 mg/dL (1-2 hours)      Critically ill patients:  140 - 180 mg/dL    Inpatient Diabetes Program Recommendations Insulin - Basal: Pt on home dose lantus 32 units daily Insulin - Meal Coverage: Pt also takes humalog meal coverage of 6 units with breakfast and lunch and 10 units at supper Once pt is eating, please add some meal coverage to regimen here of 4 units nvolog tidwc (in addition to correction as ordered)  Thank you Rosita Kea, RN, MSN, CDE  Diabetes Inpatient Program Office: [phone] Pager: [phone] 8:00 am to 5:00 pm

## 2015-01-19 NOTE — Progress Notes (Signed)
  Echocardiogram 2D Echocardiogram has been performed.  Donata Clay 01/19/2015, 2:51 PM

## 2015-02-01 ENCOUNTER — Other Ambulatory Visit: Payer: Self-pay | Admitting: *Deleted

## 2015-02-01 DIAGNOSIS — E039 Hypothyroidism, unspecified: Secondary | ICD-10-CM | POA: Diagnosis not present

## 2015-02-01 DIAGNOSIS — Z794 Long term (current) use of insulin: Secondary | ICD-10-CM | POA: Diagnosis not present

## 2015-02-01 DIAGNOSIS — E119 Type 2 diabetes mellitus without complications: Secondary | ICD-10-CM | POA: Diagnosis not present

## 2015-02-01 DIAGNOSIS — K219 Gastro-esophageal reflux disease without esophagitis: Secondary | ICD-10-CM | POA: Diagnosis not present

## 2015-02-01 DIAGNOSIS — I10 Essential (primary) hypertension: Secondary | ICD-10-CM | POA: Diagnosis not present

## 2015-02-01 DIAGNOSIS — E780 Pure hypercholesterolemia: Secondary | ICD-10-CM | POA: Diagnosis not present

## 2015-02-01 MED ORDER — BISOPROLOL-HYDROCHLOROTHIAZIDE 10-6.25 MG PO TABS: 1.0000 | ORAL_TABLET | Freq: Every day | ORAL | Status: DC

## 2015-02-03 ENCOUNTER — Other Ambulatory Visit: Payer: Self-pay

## 2015-02-03 MED ORDER — POTASSIUM CHLORIDE CRYS ER 20 MEQ PO TBCR: 20.0000 meq | EXTENDED_RELEASE_TABLET | ORAL | Status: DC

## 2015-02-03 MED ORDER — CLOPIDOGREL BISULFATE 75 MG PO TABS: 75.0000 mg | ORAL_TABLET | Freq: Every day | ORAL | Status: DC

## 2015-02-03 MED ORDER — LOSARTAN POTASSIUM 25 MG PO TABS: 25.0000 mg | ORAL_TABLET | Freq: Every day | ORAL | Status: DC

## 2015-02-10 DIAGNOSIS — Z888 Allergy status to other drugs, medicaments and biological substances status: Secondary | ICD-10-CM | POA: Diagnosis not present

## 2015-02-10 DIAGNOSIS — I10 Essential (primary) hypertension: Secondary | ICD-10-CM | POA: Diagnosis not present

## 2015-02-10 DIAGNOSIS — E039 Hypothyroidism, unspecified: Secondary | ICD-10-CM | POA: Diagnosis not present

## 2015-02-10 DIAGNOSIS — Z794 Long term (current) use of insulin: Secondary | ICD-10-CM | POA: Diagnosis not present

## 2015-02-10 DIAGNOSIS — Z881 Allergy status to other antibiotic agents status: Secondary | ICD-10-CM | POA: Diagnosis not present

## 2015-02-10 DIAGNOSIS — E1165 Type 2 diabetes mellitus with hyperglycemia: Secondary | ICD-10-CM | POA: Diagnosis not present

## 2015-02-14 ENCOUNTER — Encounter: Payer: Self-pay | Admitting: *Deleted

## 2015-02-14 DIAGNOSIS — D3500 Benign neoplasm of unspecified adrenal gland: Secondary | ICD-10-CM | POA: Insufficient documentation

## 2015-02-14 DIAGNOSIS — I10 Essential (primary) hypertension: Secondary | ICD-10-CM | POA: Insufficient documentation

## 2015-02-14 DIAGNOSIS — M5410 Radiculopathy, site unspecified: Secondary | ICD-10-CM | POA: Insufficient documentation

## 2015-02-14 DIAGNOSIS — I639 Cerebral infarction, unspecified: Secondary | ICD-10-CM | POA: Insufficient documentation

## 2015-02-14 DIAGNOSIS — IMO0002 Reserved for concepts with insufficient information to code with codable children: Secondary | ICD-10-CM | POA: Insufficient documentation

## 2015-02-14 DIAGNOSIS — M1990 Unspecified osteoarthritis, unspecified site: Secondary | ICD-10-CM | POA: Insufficient documentation

## 2015-02-14 DIAGNOSIS — M5104 Intervertebral disc disorders with myelopathy, thoracic region: Secondary | ICD-10-CM | POA: Insufficient documentation

## 2015-02-14 DIAGNOSIS — G43109 Migraine with aura, not intractable, without status migrainosus: Secondary | ICD-10-CM | POA: Insufficient documentation

## 2015-02-14 DIAGNOSIS — E119 Type 2 diabetes mellitus without complications: Secondary | ICD-10-CM | POA: Insufficient documentation

## 2015-02-14 DIAGNOSIS — E1165 Type 2 diabetes mellitus with hyperglycemia: Secondary | ICD-10-CM | POA: Insufficient documentation

## 2015-02-14 DIAGNOSIS — E785 Hyperlipidemia, unspecified: Secondary | ICD-10-CM | POA: Insufficient documentation

## 2015-02-15 ENCOUNTER — Encounter: Payer: Self-pay | Admitting: Cardiology

## 2015-02-15 ENCOUNTER — Ambulatory Visit (INDEPENDENT_AMBULATORY_CARE_PROVIDER_SITE_OTHER): Payer: Medicare Other | Admitting: Cardiology

## 2015-02-15 VITALS — BP 122/62 | HR 66 | Ht 64.0 in | Wt 196.8 lb

## 2015-02-15 DIAGNOSIS — I119 Hypertensive heart disease without heart failure: Secondary | ICD-10-CM | POA: Diagnosis not present

## 2015-02-15 DIAGNOSIS — K219 Gastro-esophageal reflux disease without esophagitis: Secondary | ICD-10-CM | POA: Diagnosis not present

## 2015-02-15 DIAGNOSIS — I639 Cerebral infarction, unspecified: Secondary | ICD-10-CM

## 2015-02-15 DIAGNOSIS — E785 Hyperlipidemia, unspecified: Secondary | ICD-10-CM

## 2015-02-15 MED ORDER — COLESEVELAM HCL 625 MG PO TABS: 1875.0000 mg | ORAL_TABLET | Freq: Two times a day (BID) | ORAL | Status: AC | PRN

## 2015-02-15 NOTE — Patient Instructions (Signed)
Medication Instructions:  Your physician recommends that you continue on your current medications as directed. Please refer to the Current Medication list given to you today.  Labwork: none  Testing/Procedures: none  Follow-Up: Your physician wants you to follow-up in: 4 months with fasting labs (lp/bmet/hfp)  You will receive a reminder letter in the mail two months in advance. If you don't receive a letter, please call our office to schedule the follow-up appointment.    

## 2015-02-15 NOTE — Progress Notes (Signed)
Cardiology Office Note   Date:  02/15/2015   ID:  CAMERON KATAYAMA, DOB 06-21-1940, MRN [id number]  PCP:  Donnie Coffin, MD  Cardiologist: Darlin Coco MD  No chief complaint on file.     History of Present Illness: Whitney Newton is a 74 y.o. female who presents for a four-month follow-up office visit  This pleasant 74 year old woman is seen for a scheduled followup visit. The patient has a history of having had a stroke in early December 2014. She was at her diabetes clinic at [facility] and they diagnosed her and sent her straight to neurology where she was hospitalized for 3 days. The MRI showed a new stroke on the right side. She had carotid Dopplers which did not show any obstructive lesions but she did have some plaque and they put her back on aspirin and continued her Plavix. The patient had a echocardiogram which was a bubble study and did not show any evidence of right-to-left shunt. More recently, she has had further neurology workup at [facility] on 06/20/14. She had a transcranial Doppler. 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 Previous carotid Dopplers had not shown any significant external cranial disease She has a history of essential hypertension, diabetes mellitus, and dyslipidemia. She has had atypical chest pain. She is intolerant of statin drugs. We updated her lexiscan Myoview stress test on 03/17/12 and it was normal showing no evidence of ischemia and her ejection fraction was 78%. The patient has never had a cardiac catheterization. She has continued to have some shortness of breath. She had an echocardiogram in 04/07/12 which showed an ejection fraction of 55-65% with grade 1 diastolic dysfunction. There is trivial aortic insufficiency.  The patient has a history of essential hypertension as well as atypical  chest pain. Her diabetes is now being followed at the [facility] clinic at [facility]. Since we last saw her she had an ophthalmology evaluation on 07/27/14 by Dr. Kathrin Penner and no diabetic retinopathy was found. Her last chest x-ray was on 12/21/12 elsewhere and showed a normal heart size and low lung volumes. She had a recent CT scan of the chest on 03/23/13 at [facility] which showed normal heart size and demonstrated atherosclerosis of the aorta.  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  Since last visit she has not been experiencing any new cardiac symptoms. She does have sleep apnea. She uses a CPAP machine. She has had some atypical chest discomfort more suggestive of dyspepsia. She had run out of fresh nitroglycerin and we have called her in a new supply today. He has variable ankle edema and we called her in a new prescription for Lasix as well. She has had some low back pain and pain in her hip radiating to her knees. She has seen Dr. Durward Fortes. She was admitted to the hospital on 01/18/15 for chest and abdominal pain.  She had a CTA of the chest on 01/18/15 which was negative for pulmonary embolus.  The following day she had a Myoview on 01/19/15 which showed no ischemia and her ejection fraction was 85%.  She had a lot of side effects from the [facility].  It was felt in retrospect that her presenting complaints were probably GI in origin rather than  cardiac.  She does have a history of GERD and sees Dr. Oletta Lamas.  Past Medical History  Diagnosis Date  . Hypertension   . Thyroid disease     hypothyroidism  . Gastritis   . Diverticulitis   . Back pain     prior back surgery in 2009 - slow to recover  . Diabetes   . HLD (hyperlipidemia)     statin intolerant  . Normal cardiac stress test 2011  . Obesity   . Hemangioma of liver     managed conservatively; followed at [facility]   . Cancer     Past Surgical History  Procedure Laterality Date  . Abdominal hysterectomy    . Nasal reconstruction    . Appendectomy       Current Outpatient Prescriptions  Medication Sig Dispense Refill  . aspirin 81 MG tablet Take 81 mg by mouth daily.    . bisoprolol-hydrochlorothiazide (ZIAC) 10-6.25 MG per tablet Take 1 tablet by mouth daily. 90 tablet 3  . clopidogrel (PLAVIX) 75 MG tablet Take 1 tablet (75 mg total) by mouth daily. 90 tablet 3  . colesevelam (WELCHOL) 625 MG tablet Take 3 tablets (1,875 mg total) by mouth 2 (two) times daily as needed (cholesterol). 180 tablet 6  . cyclobenzaprine (FLEXERIL) 5 MG tablet Take 2.5 mg by mouth daily as needed (muscle spasms).     . furosemide (LASIX) 40 MG tablet Take 1 tablet (40 mg total) by mouth daily as needed for fluid. 30 tablet 3  . gabapentin (NEURONTIN) 100 MG capsule Take 200 mg by mouth daily as needed (nerve pain).     Marland Kitchen glucose blood ([employer] BLOOD GLUCOSE TEST STRIPS) test strip Used to check Blood Sugar 4 times daily. Dx. Code 250.00    . hydrochlorothiazide (HYDRODIURIL) 25 MG tablet Take 25 mg by mouth as directed. 1/4 tablet by mouth daily    . hyoscyamine (LEVBID) 0.375 MG 12 hr tablet Take 0.375 mg by mouth every 12 (twelve) hours as needed (cramps).     . insulin glargine (LANTUS) 100 UNIT/ML injection Inject 32 Units into the skin every morning.     . insulin lispro (HUMALOG KWIKPEN) 100 UNIT/ML KiwkPen Inject 6-10 Units into the skin 3 (three) times daily. 6 units with breakfast, 6 units with lunch and 10 units with dinner    . Lancets (ACCU-CHEK MULTICLIX) lancets Used to check Blood Sugar 4 times daily. Dx. Code 250.00.    Marland Kitchen levothyroxine (SYNTHROID, LEVOTHROID) 125 MCG tablet Take 125 mcg by mouth daily.    Marland Kitchen losartan (COZAAR) 25 MG tablet Take 1 tablet (25 mg total) by mouth daily. 90 tablet 3  . nitroGLYCERIN (NITROSTAT) 0.4 MG SL tablet DISSOLVE ONE TABLET UNDER TONGUE AS NEEDED FOR ARM/CHEST PAIN. 25 tablet  PRN  . omeprazole (PRILOSEC) 20 MG capsule Take 2 capsules (40 mg total) by mouth daily. 60 capsule 1  . potassium chloride SA (K-DUR,KLOR-CON) 20 MEQ tablet Take 1 tablet (20 mEq total) by mouth 3 (three) times a week. TAke one tablet by mouth on Monday, Wednesday, and Friday or as directed 90 tablet 3  . ranitidine (ZANTAC) 150 MG tablet Take 1 tablet (150 mg total) by mouth at bedtime. 30 tablet 1   No current facility-administered medications for this visit.    Allergies:   Celecoxib; Ropinirole hcl; Actos; Crestor; Doxycycline; Fenofibrate; Imdur; Lipitor; Metformin and related; Niaspan; Pioglitazone; Pravachol; Ranolazine er; Rosuvastatin; and Zetia    Social History:  The patient  reports that  she has never smoked. She has never used smokeless tobacco. She reports that she does not drink alcohol or use illicit drugs.   Family History:  The patient's family history includes Dementia in her father; Heart attack in her brother and father; Heart disease in her brother and father; Hypertension in her mother; Stroke in her mother.    ROS:  Please see the history of present illness.   Otherwise, review of systems are positive for none.   All other systems are reviewed and negative.    PHYSICAL EXAM: VS:  BP 122/62 mmHg  Pulse 66  Ht 5\' 4"  (1.626 m)  Wt 196 lb 12.8 oz (89.268 kg)  BMI 33.76 kg/m2 , BMI Body mass index is 33.76 kg/(m^2). GEN: Well nourished, well developed, in no acute distress HEENT: normal Neck: no JVD, carotid bruits, or masses Cardiac: RRR; no murmurs, rubs, or gallops,no edema  Respiratory:  clear to auscultation bilaterally, normal work of breathing GI: soft, nontender, nondistended, + BS MS: no deformity or atrophy Skin: warm and dry, no rash Neuro:  Strength and sensation are intact Psych: euthymic mood, full affect   EKG:  EKG is not ordered today.    Recent Labs: 11/23/2014: ALT 15 01/17/2015: BUN 19; Potassium 3.5; Sodium 139 01/18/2015: Creatinine,  Ser 0.94; Hemoglobin 14.2; Platelets 236 01/19/2015: B Natriuretic Peptide 44.4    Lipid Panel    Component Value Date/Time   CHOL 199 11/23/2014 1116   TRIG 126.0 11/23/2014 1116   HDL 57.10 11/23/2014 1116   CHOLHDL 3 11/23/2014 1116   VLDL 25.2 11/23/2014 1116   LDLCALC 117* 11/23/2014 1116   LDLDIRECT 148.7 09/14/2012 0857      Wt Readings from Last 3 Encounters:  02/15/15 196 lb 12.8 oz (89.268 kg)  01/19/15 197 lb 4.8 oz (89.495 kg)  11/30/14 195 lb 12.8 oz (88.814 kg)         ASSESSMENT AND PLAN:  1. Hypertensive heart disease without heart failure 2. diabetes mellitus. Recent A1c at the [facility] clinic was 8.0 3. Atypical chest pain with normal Myoview stress test in August 2016.  No ischemia.  Ejection fraction 85%. 68. old CVA followed at [facility] 5. History of dermatofibro- sarcoma of the spine. 6. Dyslipidemia 7. Osteoarthritis 8. OSA on CPAP [facility]) 9. Hypothyroidism followed at [facility]. 10.  GERD, followed by Dr. Oletta Lamas Disposition: Continue same medication. Continue on WelChol. Recheck here in 4 months for office visit  lipid panel hepatic function panel basal metabolic. Her A1c's are followed at the [facility] clinic   .  The following changes have been made:  no change  Labs/ tests ordered today include:  No orders of the defined types were placed in this encounter.    Disposition: No change in meds.  Recheck 4 months.  Berna Spare MD 02/15/2015 5:16 PM    [facility] [address], [place], [place]  [address] Phone: [phone]; Fax: [phone]

## 2015-03-07 DIAGNOSIS — L989 Disorder of the skin and subcutaneous tissue, unspecified: Secondary | ICD-10-CM | POA: Diagnosis not present

## 2015-03-16 DIAGNOSIS — L918 Other hypertrophic disorders of the skin: Secondary | ICD-10-CM | POA: Diagnosis not present

## 2015-03-16 DIAGNOSIS — E038 Other specified hypothyroidism: Secondary | ICD-10-CM | POA: Diagnosis not present

## 2015-03-16 DIAGNOSIS — D229 Melanocytic nevi, unspecified: Secondary | ICD-10-CM | POA: Diagnosis not present

## 2015-03-16 DIAGNOSIS — C44599 Other specified malignant neoplasm of skin of other part of trunk: Secondary | ICD-10-CM | POA: Diagnosis not present

## 2015-03-16 DIAGNOSIS — L821 Other seborrheic keratosis: Secondary | ICD-10-CM | POA: Diagnosis not present

## 2015-03-16 DIAGNOSIS — G4733 Obstructive sleep apnea (adult) (pediatric): Secondary | ICD-10-CM | POA: Diagnosis not present

## 2015-03-16 DIAGNOSIS — E1165 Type 2 diabetes mellitus with hyperglycemia: Secondary | ICD-10-CM | POA: Diagnosis not present

## 2015-03-16 DIAGNOSIS — Z8673 Personal history of transient ischemic attack (TIA), and cerebral infarction without residual deficits: Secondary | ICD-10-CM | POA: Diagnosis not present

## 2015-03-16 DIAGNOSIS — I10 Essential (primary) hypertension: Secondary | ICD-10-CM | POA: Diagnosis not present

## 2015-03-16 DIAGNOSIS — Z794 Long term (current) use of insulin: Secondary | ICD-10-CM | POA: Diagnosis not present

## 2015-03-16 DIAGNOSIS — E119 Type 2 diabetes mellitus without complications: Secondary | ICD-10-CM | POA: Diagnosis not present

## 2015-03-16 DIAGNOSIS — Z9989 Dependence on other enabling machines and devices: Secondary | ICD-10-CM | POA: Diagnosis not present

## 2015-03-16 DIAGNOSIS — L570 Actinic keratosis: Secondary | ICD-10-CM | POA: Diagnosis not present

## 2015-03-16 DIAGNOSIS — E785 Hyperlipidemia, unspecified: Secondary | ICD-10-CM | POA: Diagnosis not present

## 2015-03-23 ENCOUNTER — Other Ambulatory Visit: Payer: Self-pay | Admitting: Cardiology

## 2015-03-24 ENCOUNTER — Other Ambulatory Visit: Payer: Self-pay | Admitting: Cardiology

## 2015-03-24 NOTE — Telephone Encounter (Signed)
Darlin Coco, MD at 02/15/2015 3:46 PM  10. GERD, followed by Dr. Oletta Lamas

## 2015-04-04 DIAGNOSIS — R935 Abnormal findings on diagnostic imaging of other abdominal regions, including retroperitoneum: Secondary | ICD-10-CM | POA: Diagnosis not present

## 2015-04-04 DIAGNOSIS — D1803 Hemangioma of intra-abdominal structures: Secondary | ICD-10-CM | POA: Diagnosis not present

## 2015-04-04 DIAGNOSIS — C7A8 Other malignant neuroendocrine tumors: Secondary | ICD-10-CM | POA: Diagnosis not present

## 2015-04-04 DIAGNOSIS — Z0389 Encounter for observation for other suspected diseases and conditions ruled out: Secondary | ICD-10-CM | POA: Diagnosis not present

## 2015-04-06 DIAGNOSIS — Z23 Encounter for immunization: Secondary | ICD-10-CM | POA: Diagnosis not present

## 2015-04-27 ENCOUNTER — Other Ambulatory Visit: Payer: Self-pay | Admitting: Cardiology

## 2015-04-27 DIAGNOSIS — E119 Type 2 diabetes mellitus without complications: Secondary | ICD-10-CM | POA: Diagnosis not present

## 2015-04-27 DIAGNOSIS — H43812 Vitreous degeneration, left eye: Secondary | ICD-10-CM | POA: Diagnosis not present

## 2015-04-27 DIAGNOSIS — H43811 Vitreous degeneration, right eye: Secondary | ICD-10-CM | POA: Diagnosis not present

## 2015-04-27 DIAGNOSIS — Z961 Presence of intraocular lens: Secondary | ICD-10-CM | POA: Diagnosis not present

## 2015-06-07 DIAGNOSIS — E785 Hyperlipidemia, unspecified: Secondary | ICD-10-CM | POA: Diagnosis not present

## 2015-06-07 DIAGNOSIS — R1031 Right lower quadrant pain: Secondary | ICD-10-CM | POA: Diagnosis not present

## 2015-06-07 DIAGNOSIS — I6601 Occlusion and stenosis of right middle cerebral artery: Secondary | ICD-10-CM | POA: Diagnosis not present

## 2015-06-07 DIAGNOSIS — E119 Type 2 diabetes mellitus without complications: Secondary | ICD-10-CM | POA: Diagnosis not present

## 2015-06-07 DIAGNOSIS — Z888 Allergy status to other drugs, medicaments and biological substances status: Secondary | ICD-10-CM | POA: Diagnosis not present

## 2015-06-07 DIAGNOSIS — G2581 Restless legs syndrome: Secondary | ICD-10-CM | POA: Diagnosis not present

## 2015-06-07 DIAGNOSIS — I10 Essential (primary) hypertension: Secondary | ICD-10-CM | POA: Diagnosis not present

## 2015-06-07 DIAGNOSIS — M25551 Pain in right hip: Secondary | ICD-10-CM | POA: Diagnosis not present

## 2015-06-07 DIAGNOSIS — Z881 Allergy status to other antibiotic agents status: Secondary | ICD-10-CM | POA: Diagnosis not present

## 2015-06-07 DIAGNOSIS — E039 Hypothyroidism, unspecified: Secondary | ICD-10-CM | POA: Diagnosis not present

## 2015-06-07 DIAGNOSIS — E7800 Pure hypercholesterolemia, unspecified: Secondary | ICD-10-CM | POA: Diagnosis not present

## 2015-06-07 DIAGNOSIS — Z79899 Other long term (current) drug therapy: Secondary | ICD-10-CM | POA: Diagnosis not present

## 2015-06-07 DIAGNOSIS — Z794 Long term (current) use of insulin: Secondary | ICD-10-CM | POA: Diagnosis not present

## 2015-06-07 DIAGNOSIS — Z8673 Personal history of transient ischemic attack (TIA), and cerebral infarction without residual deficits: Secondary | ICD-10-CM | POA: Diagnosis not present

## 2015-06-07 DIAGNOSIS — M5104 Intervertebral disc disorders with myelopathy, thoracic region: Secondary | ICD-10-CM | POA: Diagnosis not present

## 2015-06-07 DIAGNOSIS — M5416 Radiculopathy, lumbar region: Secondary | ICD-10-CM | POA: Diagnosis not present

## 2015-06-23 DIAGNOSIS — M5116 Intervertebral disc disorders with radiculopathy, lumbar region: Secondary | ICD-10-CM | POA: Diagnosis not present

## 2015-06-23 DIAGNOSIS — M4806 Spinal stenosis, lumbar region: Secondary | ICD-10-CM | POA: Diagnosis not present

## 2015-07-06 ENCOUNTER — Other Ambulatory Visit (INDEPENDENT_AMBULATORY_CARE_PROVIDER_SITE_OTHER): Payer: Medicare Other | Admitting: *Deleted

## 2015-07-06 DIAGNOSIS — E785 Hyperlipidemia, unspecified: Secondary | ICD-10-CM | POA: Diagnosis not present

## 2015-07-06 DIAGNOSIS — I119 Hypertensive heart disease without heart failure: Secondary | ICD-10-CM | POA: Diagnosis not present

## 2015-07-06 LAB — BASIC METABOLIC PANEL
BUN: 15 mg/dL (ref 7–25)
CALCIUM: 9.2 mg/dL (ref 8.6–10.4)
CO2: 21 mmol/L (ref 20–31)
Chloride: 101 mmol/L (ref 98–110)
Creat: 0.82 mg/dL (ref 0.60–0.93)
GLUCOSE: 157 mg/dL — AB (ref 65–99)
POTASSIUM: 3.7 mmol/L (ref 3.5–5.3)
Sodium: 139 mmol/L (ref 135–146)

## 2015-07-06 LAB — LIPID PANEL
CHOLESTEROL: 199 mg/dL (ref 125–200)
HDL: 49 mg/dL (ref >=46)
LDL Cholesterol: 127 mg/dL (ref <130)
TRIGLYCERIDES: 116 mg/dL (ref <150)
Total CHOL/HDL Ratio: 4.1 Ratio (ref <=5.0)
VLDL: 23 mg/dL (ref <30)

## 2015-07-06 LAB — HEPATIC FUNCTION PANEL
ALK PHOS: 70 U/L (ref 33–130)
ALT: 17 U/L (ref 6–29)
AST: 18 U/L (ref 10–35)
Albumin: 3.9 g/dL (ref 3.6–5.1)
BILIRUBIN DIRECT: 0.2 mg/dL (ref <=0.2)
BILIRUBIN INDIRECT: 0.6 mg/dL (ref 0.2–1.2)
TOTAL PROTEIN: 6.5 g/dL (ref 6.1–8.1)
Total Bilirubin: 0.8 mg/dL (ref 0.2–1.2)

## 2015-07-06 NOTE — Addendum Note (Signed)
Addended by: Eulis Foster on: 07/06/2015 10:24 AM   Modules accepted: Orders

## 2015-07-06 NOTE — Progress Notes (Signed)
Quick Note:  Please make copy of labs for patient visit. ______ 

## 2015-07-13 ENCOUNTER — Ambulatory Visit (INDEPENDENT_AMBULATORY_CARE_PROVIDER_SITE_OTHER): Payer: Medicare Other | Admitting: Cardiology

## 2015-07-13 ENCOUNTER — Encounter: Payer: Self-pay | Admitting: Cardiology

## 2015-07-13 VITALS — BP 126/70 | HR 68 | Ht 65.0 in | Wt 196.8 lb

## 2015-07-13 DIAGNOSIS — E785 Hyperlipidemia, unspecified: Secondary | ICD-10-CM | POA: Diagnosis not present

## 2015-07-13 DIAGNOSIS — I4510 Unspecified right bundle-branch block: Secondary | ICD-10-CM | POA: Diagnosis not present

## 2015-07-13 DIAGNOSIS — I119 Hypertensive heart disease without heart failure: Secondary | ICD-10-CM | POA: Diagnosis not present

## 2015-07-13 NOTE — Progress Notes (Signed)
Cardiology Office Note   Date:  07/13/2015   ID:  [name], [name] 1940-07-19, MRN [id number]  PCP:  Donnie Coffin, MD  Cardiologist: Darlin Coco MD  Chief Complaint  Patient presents with  . routine follow up    c/o sob with activity      History of Present Illness: Whitney Newton is a 75 y.o. female who presents for  Four-month follow-up visit  This pleasant 75 year old woman is seen for a scheduled followup visit. The patient has a history of having had a stroke in early December 2014. She was at her diabetes clinic at [facility] and they diagnosed her and sent her straight to neurology where she was hospitalized for 3 days. The MRI showed a new stroke on the right side. She had carotid Dopplers which did not show any obstructive lesions but she did have some plaque and they put her back on aspirin and continued her Plavix. The patient had a echocardiogram which was a bubble study and did not show any evidence of right-to-left shunt. More recently, she has had further neurology workup at [facility] on 06/20/14. She had a transcranial Doppler. 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 Previous carotid Dopplers had not shown any significant external cranial disease She has a history of essential hypertension, diabetes mellitus, and dyslipidemia. She has had atypical chest pain. She is intolerant of statin drugs. We updated her lexiscan Myoview stress test on 03/17/12 and it was normal showing no evidence of ischemia and her ejection fraction was 78%. The patient has never had a cardiac catheterization. She has continued to have some shortness of breath. She had an echocardiogram in 04/07/12 which showed an ejection fraction of 55-65% with grade 1 diastolic dysfunction. There is trivial aortic insufficiency.  The patient has a  history of essential hypertension as well as atypical chest pain. Her diabetes is now being followed at the [facility] clinic at [facility]. Since we last saw her she had an ophthalmology evaluation on 07/27/14 by Dr. Kathrin Penner and no diabetic retinopathy was found. Her last chest x-ray was on 12/21/12 elsewhere and showed a normal heart size and low lung volumes. She had a recent CT scan of the chest on 03/23/13 at [facility] which showed normal heart size and demonstrated atherosclerosis of the aorta.  The patient has a past history of dermatofibrosarcoma of the spine. She had surgery for this at [facility]. She also has a past history of angiolipoma of the abdominal wall.  Since last visit she has not been experiencing any new cardiac symptoms. She does have sleep apnea. She uses a CPAP machine. She has had some low back pain and pain in her hip radiating to her knees. She has seen Dr. Durward Fortes. She was admitted to the hospital on 01/18/15 for chest and abdominal pain. She had a CTA of the chest on 01/18/15 which was negative for pulmonary embolus. The following day she had a Myoview on 01/19/15 which showed no ischemia and her ejection fraction was 85%. She had a lot of side effects from the [facility]. It was felt in retrospect that her presenting complaints were probably GI in origin rather than cardiac. She does have a history of GERD and sees Dr. Oletta Lamas.  since last visit she has had no new cardiac symptoms.  She has not been  having any recent chest discomfort. Her weight is unchanged and her blood pressures been stable.  Past Medical History  Diagnosis Date  . Hypertension   . Thyroid disease     hypothyroidism  . Gastritis   . Diverticulitis   . Back pain     prior back surgery in 2009 - slow to recover  . Diabetes ([place])   . HLD (hyperlipidemia)     statin intolerant  . Normal cardiac stress test 2011  . Obesity   . Hemangioma of liver     managed  conservatively; followed at [facility]  . Cancer [facility])     Past Surgical History  Procedure Laterality Date  . Abdominal hysterectomy    . Nasal reconstruction    . Appendectomy       Current Outpatient Prescriptions  Medication Sig Dispense Refill  . aspirin 81 MG tablet Take 81 mg by mouth daily.    . bisoprolol-hydrochlorothiazide (ZIAC) 10-6.25 MG per tablet Take 1 tablet by mouth daily. 90 tablet 3  . clopidogrel (PLAVIX) 75 MG tablet Take 1 tablet (75 mg total) by mouth daily. 90 tablet 3  . colesevelam (WELCHOL) 625 MG tablet Take 3 tablets (1,875 mg total) by mouth 2 (two) times daily as needed (cholesterol). 180 tablet 6  . cyclobenzaprine (FLEXERIL) 5 MG tablet Take 2.5 mg by mouth daily as needed (muscle spasms).     . furosemide (LASIX) 40 MG tablet Take 1 tablet (40 mg total) by mouth daily as needed for fluid. 30 tablet 3  . gabapentin (NEURONTIN) 100 MG capsule Take 200 mg by mouth daily as needed (nerve pain).     Marland Kitchen glucose blood ([employer]S BLOOD GLUCOSE TEST STRIPS) test strip Used to check Blood Sugar 4 times daily. Dx. Code 250.00    . hydrochlorothiazide (HYDRODIURIL) 25 MG tablet Take by mouth daily. Take 1/4 tablet by mouth once daily    . hyoscyamine (LEVBID) 0.375 MG 12 hr tablet Take 0.375 mg by mouth every 12 (twelve) hours as needed (cramps).     . insulin glargine (LANTUS) 100 UNIT/ML injection Inject 32 Units into the skin every morning.     . insulin lispro (HUMALOG KWIKPEN) 100 UNIT/ML KiwkPen Inject 6-10 Units into the skin 3 (three) times daily. 6 units with breakfast, 6 units with lunch and 10 units with dinner    . Lancets (ACCU-CHEK MULTICLIX) lancets Used to check Blood Sugar 4 times daily. Dx. Code 250.00.    Marland Kitchen levothyroxine (SYNTHROID, LEVOTHROID) 125 MCG tablet Take 125 mcg by mouth daily.    Marland Kitchen losartan (COZAAR) 25 MG tablet Take 1 tablet (25 mg total) by mouth daily. 90 tablet 3  . nitroGLYCERIN (NITROSTAT) 0.4 MG SL tablet DISSOLVE ONE TABLET UNDER TONGUE  AS NEEDED FOR ARM/CHEST PAIN. 25 tablet PRN  . potassium chloride SA (K-DUR,KLOR-CON) 20 MEQ tablet Take 1 tablet (20 mEq total) by mouth 3 (three) times a week. TAke one tablet by mouth on Monday, Wednesday, and Friday or as directed 90 tablet 3   No current facility-administered medications for this visit.    Allergies:   Celecoxib; Ropinirole hcl; Actos; Crestor; Doxycycline; Fenofibrate; Imdur; Lipitor; Metformin and related; Niaspan; Pioglitazone; Pravachol; Ranolazine er; Rosuvastatin; and Zetia    Social History:  The patient  reports that she has never smoked. She has never used smokeless tobacco. She reports that she does not drink alcohol or use illicit drugs.   Family History:  The patient's family history includes Dementia in her father;  Heart attack in her brother and father; Heart disease in her brother and father; Hypertension in her mother; Stroke in her mother.    ROS:  Please see the history of present illness.   Otherwise, review of systems are positive for none.   All other systems are reviewed and negative.    PHYSICAL EXAM: VS:  BP 126/70 mmHg  Pulse 68  Ht 5\' 5"  (1.651 m)  Wt 196 lb 12.8 oz (89.268 kg)  BMI 32.75 kg/m2 , BMI Body mass index is 32.75 kg/(m^2). GEN: Well nourished, well developed, in no acute distress HEENT: normal Neck: no JVD, carotid bruits, or masses Cardiac: RRR; no murmurs, rubs, or gallops,no edema  Respiratory:  clear to auscultation bilaterally, normal work of breathing GI: soft, nontender, nondistended, + BS MS: no deformity or atrophy Skin: warm and dry, no rash Neuro:  Strength and sensation are intact Psych: euthymic mood, full affect   EKG:  EKG is ordered today. The ekg ordered today demonstrates  Normal sinus rhythm.  Bifascicular block. No significant change since prior tracing of 01/17/15   Recent Labs: 01/18/2015: Hemoglobin 14.2; Platelets 236 01/19/2015: B Natriuretic Peptide 44.4 07/06/2015: ALT 17; BUN 15; Creat 0.82;  Potassium 3.7; Sodium 139    Lipid Panel    Component Value Date/Time   CHOL 199 07/06/2015 1025   TRIG 116 07/06/2015 1025   HDL 49 07/06/2015 1025   CHOLHDL 4.1 07/06/2015 1025   VLDL 23 07/06/2015 1025   LDLCALC 127 07/06/2015 1025   LDLDIRECT 148.7 09/14/2012 0857      Wt Readings from Last 3 Encounters:  07/13/15 196 lb 12.8 oz (89.268 kg)  02/15/15 196 lb 12.8 oz (89.268 kg)  01/19/15 197 lb 4.8 oz (89.495 kg)        ASSESSMENT AND PLAN:  1. Hypertensive heart disease without heart failure 2. diabetes mellitus. Recent A1c at the [facility] clinic was 8.0 3. Atypical chest pain with normal Myoview stress test in August 2016. No ischemia. Ejection fraction 85%. 74. old CVA followed at [facility] 5. History of dermatofibro- sarcoma of the spine. 6. Dyslipidemia 7. Osteoarthritis 8. OSA on CPAP [facility]) 9. Hypothyroidism followed at [facility]. 10. GERD, followed by Dr. Oletta Lamas Disposition: Continue same medication. Continue on WelChol.  Recheck here in 4 months for office visit  Dr. Debara Pickett at [facility]. She would like to get fasting lipid panel hepatic function panel and basal metabolic panel prior to that visit.   Current medicines are reviewed at length with the patient today.  The patient does not have concerns regarding medicines.  The following changes have been made:  no change  Labs/ tests ordered today include:   Orders Placed This Encounter  Procedures  . EKG 12-Lead       Signed, Darlin Coco MD 07/13/2015 5:24 PM    [facility] [address], [place], [place]  [address] Phone: [phone]; Fax: [phone]

## 2015-07-13 NOTE — Patient Instructions (Signed)
Medication Instructions:  Your physician recommends that you continue on your current medications as directed. Please refer to the Current Medication list given to you today.  Labwork: none  Testing/Procedures: none  Follow-Up: Your physician recommends that you schedule a follow-up appointment in: 4 months with fasting labs (lp/bmet/hfp) with Dr Debara Pickett at [facility]   If you need a refill on your cardiac medications before your next appointment, please call your pharmacy.

## 2015-07-25 ENCOUNTER — Ambulatory Visit
Admission: RE | Admit: 2015-07-25 | Discharge: 2015-07-25 | Disposition: A | Discharge status: Home / Self Care | Payer: Medicare Other | Source: Ambulatory Visit | Attending: Physician Assistant | Admitting: Physician Assistant

## 2015-07-25 ENCOUNTER — Other Ambulatory Visit: Payer: Self-pay | Admitting: Physician Assistant

## 2015-07-25 DIAGNOSIS — R0602 Shortness of breath: Secondary | ICD-10-CM | POA: Diagnosis not present

## 2015-07-25 DIAGNOSIS — R0789 Other chest pain: Secondary | ICD-10-CM

## 2015-07-25 DIAGNOSIS — R079 Chest pain, unspecified: Secondary | ICD-10-CM | POA: Diagnosis not present

## 2015-08-01 DIAGNOSIS — Z6835 Body mass index (BMI) 35.0-35.9, adult: Secondary | ICD-10-CM | POA: Diagnosis not present

## 2015-08-01 DIAGNOSIS — Z1231 Encounter for screening mammogram for malignant neoplasm of breast: Secondary | ICD-10-CM | POA: Diagnosis not present

## 2015-08-01 DIAGNOSIS — N3281 Overactive bladder: Secondary | ICD-10-CM | POA: Diagnosis not present

## 2015-08-01 DIAGNOSIS — Z01419 Encounter for gynecological examination (general) (routine) without abnormal findings: Secondary | ICD-10-CM | POA: Diagnosis not present

## 2015-08-15 DIAGNOSIS — I10 Essential (primary) hypertension: Secondary | ICD-10-CM | POA: Diagnosis not present

## 2015-08-15 DIAGNOSIS — E7800 Pure hypercholesterolemia, unspecified: Secondary | ICD-10-CM | POA: Diagnosis not present

## 2015-08-15 DIAGNOSIS — Z794 Long term (current) use of insulin: Secondary | ICD-10-CM | POA: Diagnosis not present

## 2015-08-15 DIAGNOSIS — Z7982 Long term (current) use of aspirin: Secondary | ICD-10-CM | POA: Diagnosis not present

## 2015-08-15 DIAGNOSIS — E039 Hypothyroidism, unspecified: Secondary | ICD-10-CM | POA: Diagnosis not present

## 2015-08-15 DIAGNOSIS — E785 Hyperlipidemia, unspecified: Secondary | ICD-10-CM | POA: Diagnosis not present

## 2015-08-15 DIAGNOSIS — E1165 Type 2 diabetes mellitus with hyperglycemia: Secondary | ICD-10-CM | POA: Diagnosis not present

## 2015-08-15 DIAGNOSIS — Z79899 Other long term (current) drug therapy: Secondary | ICD-10-CM | POA: Diagnosis not present

## 2015-08-15 DIAGNOSIS — Z8673 Personal history of transient ischemic attack (TIA), and cerebral infarction without residual deficits: Secondary | ICD-10-CM | POA: Diagnosis not present

## 2015-09-18 DIAGNOSIS — D225 Melanocytic nevi of trunk: Secondary | ICD-10-CM | POA: Diagnosis not present

## 2015-09-18 DIAGNOSIS — L853 Xerosis cutis: Secondary | ICD-10-CM | POA: Diagnosis not present

## 2015-09-18 DIAGNOSIS — D2271 Melanocytic nevi of right lower limb, including hip: Secondary | ICD-10-CM | POA: Diagnosis not present

## 2015-09-18 DIAGNOSIS — L570 Actinic keratosis: Secondary | ICD-10-CM | POA: Diagnosis not present

## 2015-09-18 DIAGNOSIS — L821 Other seborrheic keratosis: Secondary | ICD-10-CM | POA: Diagnosis not present

## 2015-09-18 DIAGNOSIS — L304 Erythema intertrigo: Secondary | ICD-10-CM | POA: Diagnosis not present

## 2015-10-05 DIAGNOSIS — L602 Onychogryphosis: Secondary | ICD-10-CM | POA: Diagnosis not present

## 2015-10-24 ENCOUNTER — Other Ambulatory Visit: Payer: Self-pay

## 2015-10-24 MED ORDER — HYDROCHLOROTHIAZIDE 25 MG PO TABS: 25.0000 mg | ORAL_TABLET | Freq: Every day | ORAL | Status: DC

## 2015-11-14 ENCOUNTER — Telehealth: Payer: Self-pay | Admitting: Internal Medicine

## 2015-11-14 NOTE — Telephone Encounter (Signed)
New message  Pt has re-est appt w/ Dr Debara Pickett June 14- has lab work sched for tomorrow- 6/7- wanted to know if she can have these done @ her PCP tomorrow- Dr Alroy Dust. Please call back and discuss.

## 2015-11-14 NOTE — Telephone Encounter (Signed)
Returned call to patient. She states she has problems w/feet and leg swelling & some shortness of breath.  She is glad she is seeing her MD soon.   Patient said her PCP requires her to be fasting - informed her it is best to have her PCP send the lab results to Dr. Debara Pickett and cancel previously scheduled lab appt @ [address] for 11/15/15.  She voiced understanding.

## 2015-11-15 ENCOUNTER — Other Ambulatory Visit (INDEPENDENT_AMBULATORY_CARE_PROVIDER_SITE_OTHER): Payer: Medicare Other | Admitting: *Deleted

## 2015-11-15 ENCOUNTER — Other Ambulatory Visit: Payer: Medicare Other

## 2015-11-15 DIAGNOSIS — E785 Hyperlipidemia, unspecified: Secondary | ICD-10-CM

## 2015-11-15 DIAGNOSIS — R229 Localized swelling, mass and lump, unspecified: Secondary | ICD-10-CM | POA: Diagnosis not present

## 2015-11-15 DIAGNOSIS — I10 Essential (primary) hypertension: Secondary | ICD-10-CM

## 2015-11-15 DIAGNOSIS — E039 Hypothyroidism, unspecified: Secondary | ICD-10-CM | POA: Diagnosis not present

## 2015-11-15 DIAGNOSIS — I679 Cerebrovascular disease, unspecified: Secondary | ICD-10-CM | POA: Diagnosis not present

## 2015-11-15 DIAGNOSIS — E119 Type 2 diabetes mellitus without complications: Secondary | ICD-10-CM | POA: Diagnosis not present

## 2015-11-15 DIAGNOSIS — R609 Edema, unspecified: Secondary | ICD-10-CM | POA: Diagnosis not present

## 2015-11-15 DIAGNOSIS — I119 Hypertensive heart disease without heart failure: Secondary | ICD-10-CM

## 2015-11-15 DIAGNOSIS — E7800 Pure hypercholesterolemia, unspecified: Secondary | ICD-10-CM | POA: Diagnosis not present

## 2015-11-15 DIAGNOSIS — K219 Gastro-esophageal reflux disease without esophagitis: Secondary | ICD-10-CM | POA: Diagnosis not present

## 2015-11-15 LAB — BASIC METABOLIC PANEL
BUN: 18 mg/dL (ref 7–25)
CO2: 22 mmol/L (ref 20–31)
Calcium: 9 mg/dL (ref 8.6–10.4)
Chloride: 104 mmol/L (ref 98–110)
Creat: 0.74 mg/dL (ref 0.60–0.93)
GLUCOSE: 161 mg/dL — AB (ref 65–99)
POTASSIUM: 4 mmol/L (ref 3.5–5.3)
SODIUM: 140 mmol/L (ref 135–146)

## 2015-11-15 LAB — HEPATIC FUNCTION PANEL
ALT: 14 U/L (ref 6–29)
AST: 13 U/L (ref 10–35)
Albumin: 3.9 g/dL (ref 3.6–5.1)
Alkaline Phosphatase: 63 U/L (ref 33–130)
BILIRUBIN DIRECT: 0.1 mg/dL (ref <=0.2)
Indirect Bilirubin: 0.5 mg/dL (ref 0.2–1.2)
TOTAL PROTEIN: 6.1 g/dL (ref 6.1–8.1)
Total Bilirubin: 0.6 mg/dL (ref 0.2–1.2)

## 2015-11-15 LAB — LIPID PANEL
CHOL/HDL RATIO: 3.8 ratio (ref <=5.0)
CHOLESTEROL: 209 mg/dL — AB (ref 125–200)
HDL: 55 mg/dL (ref >=46)
LDL Cholesterol: 134 mg/dL — ABNORMAL HIGH (ref <130)
TRIGLYCERIDES: 102 mg/dL (ref <150)
VLDL: 20 mg/dL (ref <30)

## 2015-11-15 NOTE — Addendum Note (Signed)
Addended by: Eulis Foster on: 11/15/2015 09:54 AM   Modules accepted: Orders

## 2015-11-21 DIAGNOSIS — J029 Acute pharyngitis, unspecified: Secondary | ICD-10-CM | POA: Diagnosis not present

## 2015-11-21 DIAGNOSIS — J069 Acute upper respiratory infection, unspecified: Secondary | ICD-10-CM | POA: Diagnosis not present

## 2015-11-21 DIAGNOSIS — J209 Acute bronchitis, unspecified: Secondary | ICD-10-CM | POA: Diagnosis not present

## 2015-11-22 ENCOUNTER — Ambulatory Visit (INDEPENDENT_AMBULATORY_CARE_PROVIDER_SITE_OTHER): Payer: Medicare Other | Admitting: Internal Medicine

## 2015-11-22 ENCOUNTER — Encounter: Payer: Self-pay | Admitting: Internal Medicine

## 2015-11-22 VITALS — BP 146/69 | HR 74 | Ht 63.5 in | Wt 202.0 lb

## 2015-11-22 DIAGNOSIS — Z79899 Other long term (current) drug therapy: Secondary | ICD-10-CM

## 2015-11-22 DIAGNOSIS — I119 Hypertensive heart disease without heart failure: Secondary | ICD-10-CM

## 2015-11-22 DIAGNOSIS — R600 Localized edema: Secondary | ICD-10-CM

## 2015-11-22 DIAGNOSIS — R0602 Shortness of breath: Secondary | ICD-10-CM

## 2015-11-22 DIAGNOSIS — I10 Essential (primary) hypertension: Secondary | ICD-10-CM | POA: Diagnosis not present

## 2015-11-22 DIAGNOSIS — R0600 Dyspnea, unspecified: Secondary | ICD-10-CM | POA: Insufficient documentation

## 2015-11-22 MED ORDER — POTASSIUM CHLORIDE CRYS ER 20 MEQ PO TBCR: 20.0000 meq | EXTENDED_RELEASE_TABLET | Freq: Every day | ORAL | Status: DC

## 2015-11-22 MED ORDER — FUROSEMIDE 40 MG PO TABS: 40.0000 mg | ORAL_TABLET | Freq: Every day | ORAL | Status: DC

## 2015-11-22 NOTE — Patient Instructions (Signed)
Medication Instructions:   START lasix 40mg  once daily TAKE potassium every daily  Labwork:  Your physician recommends that you return for lab work NEXT WEEK   Testing/Procedures:  Your physician has requested that you have an echocardiogram @ [address]. Echocardiography is a painless test that uses sound waves to create images of your heart. It provides your doctor with information about the size and shape of your heart and how well your heart's chambers and valves are working. This procedure takes approximately one hour. There are no restrictions for this procedure.   Follow-Up:  Your physician recommends that you schedule a follow-up appointment with Dr. Debara Pickett after your testing.    Any Other Special Instructions Will Be Listed Below (If Applicable).

## 2015-11-22 NOTE — Progress Notes (Signed)
Cardiology Office Note   Date:  11/22/2015   ID:  Whitney [facility], DOB 02-14-1941, MRN [id number]  PCP:  Whitney Coffin, MD  Cardiologist: Whitney Coco MD  Chief Complaint  Patient presents with  . New Evaluation    former Whitney Ferrari, MD patient - labs in EPIC        History of Present Illness: Whitney [facility] is a 75 y.o. female who presents for  Four-month follow-up visit  This pleasant 75 year old woman is seen for a scheduled followup visit. The patient has a history of having had a stroke in early December 2014. She was at her diabetes clinic at [facility] and they diagnosed her and sent her straight to neurology where she was hospitalized for 3 days. The MRI showed a new stroke on the right side. She had carotid Dopplers which did not show any obstructive lesions but she did have some plaque and they put her back on aspirin and continued her Plavix. The patient had a echocardiogram which was a bubble study and did not show any evidence of right-to-left shunt. More recently, she has had further neurology workup at [facility] on 06/20/14. She had a transcranial Doppler. 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 Previous carotid Dopplers had not shown any significant external cranial disease She has a history of essential hypertension, diabetes mellitus, and dyslipidemia. She has had atypical chest pain. She is intolerant of statin drugs. We updated her lexiscan Myoview stress test on 03/17/12 and it was normal showing no evidence of ischemia and her ejection fraction was 78%. The patient has never had a cardiac catheterization. She has continued to have some shortness of breath. She had an echocardiogram in 04/07/12 which showed an ejection fraction of 55-65% with grade 1 diastolic dysfunction. There is trivial aortic insufficiency.   The patient has a history of essential hypertension as well as atypical chest pain. Her diabetes is now being followed at the [facility]. Since we last saw her she had an ophthalmology evaluation on 07/27/14 by Dr. Kathrin [facility] and no diabetic retinopathy was found. Her last chest x-ray was on 12/21/12 elsewhere and showed a normal heart size and low lung volumes. She had a recent CT scan of the chest on 03/23/13 at [facility] which showed normal heart size and demonstrated atherosclerosis of the aorta.  The patient has a past history of dermatofibrosarcoma of the spine. She had surgery for this at [facility]. She also has a past history of angiolipoma of the abdominal wall.  Since last visit she has not been experiencing any new cardiac symptoms. She does have sleep apnea. She uses a CPAP machine. She has had some low back pain and pain in her hip radiating to her knees. She has seen Dr. Durward [facility]. She was admitted to the [facility] on 01/18/15 for chest and abdominal pain. She had a CTA of the chest on 01/18/15 which was negative for pulmonary embolus. The following day she had a Myoview on 01/19/15 which showed no ischemia and her ejection fraction was 85%. She had a lot of side effects from the [facility]. It was felt in retrospect that her presenting complaints were probably GI in origin rather than cardiac. She does have a history of GERD and sees Dr. Oletta [facility].  since last visit she has had no new cardiac symptoms.  She has not been having any recent chest discomfort. Her weight is unchanged and her blood pressures been stable.  11/22/2015  Whitney [facility] presents today to establish care with me. She is a previous patient of Dr. Warren [facility]. She recently was at the beach and developed some worsening shortness of breath and leg swelling. Her nephew was there who came down from [place] with a head cold and she seems to have symptoms now at this time. She  was also seen at an urgent care there and placed on amoxicillin. She's taken that for a few days. She notes significant swelling in her weight is much higher than it had been in the past. She endorses a high salt intake. Her last echocardiogram was in 2013 which showed an EF of 55-65% with grade 1 diastolic dysfunction. She had a stress test in 2016 for chest pain which showed an EF of 85%.   Past Medical History  Diagnosis Date  . Hypertension   . Thyroid disease     hypothyroidism  . Gastritis   . Diverticulitis   . Back pain     prior back surgery in 2009 - slow to recover  . Diabetes ([place])   . HLD (hyperlipidemia)     statin intolerant  . Normal cardiac stress test 2011  . Obesity   . Hemangioma of liver     managed conservatively; followed at [facility]  . Cancer [facility])     Past Surgical History  Procedure Laterality Date  . Abdominal hysterectomy    . Nasal reconstruction    . Appendectomy       Current Outpatient Prescriptions  Medication Sig Dispense Refill  . aspirin 81 MG tablet Take 81 mg by mouth daily.    . bisoprolol-hydrochlorothiazide (ZIAC) 10-6.25 MG per tablet Take 1 tablet by mouth daily. 90 tablet 3  . clopidogrel (PLAVIX) 75 MG tablet Take 1 tablet (75 mg total) by mouth daily. 90 tablet 3  . colesevelam (WELCHOL) 625 MG tablet Take 3 tablets (1,875 mg total) by mouth 2 (two) times daily as needed (cholesterol). 180 tablet 6  . cyclobenzaprine (FLEXERIL) 5 MG tablet Take 2.5 mg by mouth daily as needed (muscle spasms).     . furosemide (LASIX) 40 MG tablet Take 1 tablet (40 mg total) by mouth daily as needed for fluid. 30 tablet 3  . gabapentin (NEURONTIN) 100 MG capsule Take 200 mg by mouth daily as needed (nerve pain).     Marland Kitchen glucose blood ([employer] BLOOD GLUCOSE TEST STRIPS) test strip Used to check Blood Sugar 4 times daily. Dx. Code 250.00    . hydrochlorothiazide (HYDRODIURIL) 25 MG tablet Take 6.25 mg by mouth daily.    . hyoscyamine (LEVBID) 0.375 MG 12 hr  tablet Take 0.375 mg by mouth every 12 (twelve) hours as needed (cramps).     . insulin glargine (LANTUS) 100 UNIT/ML injection Inject 32 Units into the skin every morning.     . Lancets (ACCU-CHEK MULTICLIX) lancets Used to check Blood Sugar 4 times daily. Dx. Code 250.00.    Marland Kitchen levothyroxine (SYNTHROID, LEVOTHROID) 125 MCG tablet Take 125 mcg by mouth daily.    Marland Kitchen losartan (COZAAR) 25 MG tablet Take 1 tablet (25 mg total) by mouth daily. 90 tablet 3  . nitroGLYCERIN (NITROSTAT) 0.4 MG SL tablet DISSOLVE ONE TABLET UNDER TONGUE AS NEEDED FOR ARM/CHEST PAIN. 25 tablet PRN  . potassium chloride SA (K-DUR,KLOR-CON) 20 MEQ tablet Take 1 tablet (20 mEq total) by mouth  3 (three) times a week. TAke one tablet by mouth on Monday, Wednesday, and Friday or as directed 90 tablet 3   No current facility-administered medications for this visit.    Allergies:   Celecoxib; Ropinirole hcl; Actos; Crestor; Doxycycline; Fenofibrate; Imdur; Lipitor; Metformin and related; Niaspan; Pioglitazone; Pravachol; Ranolazine er; Rosuvastatin; and Zetia    Social History:  The patient  reports that she has never smoked. She has never used smokeless tobacco. She reports that she does not drink alcohol or use illicit drugs.   Family History:  The patient's family history includes Dementia in her father; Heart attack in her brother and father; Heart disease in her brother and father; Hypertension in her mother; Stroke in her mother.    ROS:  Please see the history of present illness.   Otherwise, review of systems are positive for none.   All other systems are reviewed and negative.    PHYSICAL EXAM: VS:  BP 146/69 mmHg  Pulse 74  Ht 5' 3.5" (1.613 m)  Wt 202 lb (91.627 kg)  BMI 35.22 kg/m2 , BMI Body mass index is 35.22 kg/(m^2). GEN: Well nourished, well developed, in no acute distress Neck: no JVD, carotid bruits, or masses Cardiac: RRR; no murmurs, rubs, or gallops,no edema  Respiratory:  Diminished breath sounds  bilaterally GI: soft, nontender, nondistended, + BS MS: no deformity or atrophy Skin: warm and dry, no rash Neuro:  Strength and sensation are intact Psych: euthymic mood, full affect Ext: 2+ bilateral pitting edema   EKG:   Normal sinus rhythm at 73, RBBB  Recent Labs: 01/18/2015: Hemoglobin 14.2; Platelets 236 01/19/2015: B Natriuretic Peptide 44.4 11/15/2015: ALT 14; BUN 18; Creat 0.74; Potassium 4.0; Sodium 140    Lipid Panel    Component Value Date/Time   CHOL 209* 11/15/2015 0954   TRIG 102 11/15/2015 0954   HDL 55 11/15/2015 0954   CHOLHDL 3.8 11/15/2015 0954   VLDL 20 11/15/2015 0954   LDLCALC 134* 11/15/2015 0954   LDLDIRECT 148.7 09/14/2012 0857      Wt Readings from Last 3 Encounters:  11/22/15 202 lb (91.627 kg)  07/13/15 196 lb 12.8 oz (89.268 kg)  02/15/15 196 lb 12.8 oz (89.268 kg)     ASSESSMENT AND PLAN:  1. Progressive DOE - Leg edema 2. Hypertensive heart disease without heart failure 3. Diabetes mellitus. Recent A1c at the [facility] clinic was 8.0 4. Atypical chest pain with normal Myoview stress test in August 2016. No ischemia. Ejection fraction 85%. 77. old CVA followed at [facility] 6. History of dermatofibro- sarcoma of the spine. 7. Dyslipidemia 8. Osteoarthritis 9. OSA on CPAP [facility]) 10. Hypothyroidism followed at [facility]. 11. GERD, followed by Dr. Oletta [facility]   PLAN: 1. She reports increased shortness of breath and lower extremity swelling. This may be due to salt intake. I advised her to decrease sodium intake. Will start Lasix 40 mg daily and increase potassium to 20 mEq daily rather than 3 times a week. We'll go and recheck an echocardiogram to make sure there's been no change in LV function. I like to reassess a metabolic profile and BNP next week. Plan follow-up afterwards to see if she's adequately diuresed and weight is coming down. This may be diastolic heart failure.  Pixie Casino, MD, [facility] Attending  Cardiologist [facility]   11/22/2015 4:18 PM

## 2015-11-29 DIAGNOSIS — R0602 Shortness of breath: Secondary | ICD-10-CM | POA: Diagnosis not present

## 2015-11-29 DIAGNOSIS — Z79899 Other long term (current) drug therapy: Secondary | ICD-10-CM | POA: Diagnosis not present

## 2015-11-30 LAB — BASIC METABOLIC PANEL
BUN: 19 mg/dL (ref 7–25)
CHLORIDE: 99 mmol/L (ref 98–110)
CO2: 24 mmol/L (ref 20–31)
CREATININE: 0.85 mg/dL (ref 0.60–0.93)
Calcium: 9.4 mg/dL (ref 8.6–10.4)
Glucose, Bld: 256 mg/dL — ABNORMAL HIGH (ref 65–99)
Potassium: 4.1 mmol/L (ref 3.5–5.3)
Sodium: 138 mmol/L (ref 135–146)

## 2015-11-30 LAB — BRAIN NATRIURETIC PEPTIDE: Brain Natriuretic Peptide: 8.4 pg/mL (ref <100)

## 2015-12-14 ENCOUNTER — Ambulatory Visit (HOSPITAL_COMMUNITY): Payer: Medicare Other | Attending: Internal Medicine

## 2015-12-14 ENCOUNTER — Other Ambulatory Visit: Payer: Self-pay

## 2015-12-14 DIAGNOSIS — E785 Hyperlipidemia, unspecified: Secondary | ICD-10-CM | POA: Diagnosis not present

## 2015-12-14 DIAGNOSIS — I059 Rheumatic mitral valve disease, unspecified: Secondary | ICD-10-CM | POA: Diagnosis not present

## 2015-12-14 DIAGNOSIS — E119 Type 2 diabetes mellitus without complications: Secondary | ICD-10-CM | POA: Diagnosis not present

## 2015-12-14 DIAGNOSIS — R0600 Dyspnea, unspecified: Secondary | ICD-10-CM | POA: Diagnosis present

## 2015-12-14 DIAGNOSIS — G4733 Obstructive sleep apnea (adult) (pediatric): Secondary | ICD-10-CM | POA: Diagnosis not present

## 2015-12-14 DIAGNOSIS — I119 Hypertensive heart disease without heart failure: Secondary | ICD-10-CM | POA: Diagnosis not present

## 2015-12-14 DIAGNOSIS — R0602 Shortness of breath: Secondary | ICD-10-CM | POA: Diagnosis not present

## 2015-12-14 DIAGNOSIS — R600 Localized edema: Secondary | ICD-10-CM | POA: Insufficient documentation

## 2015-12-14 LAB — ECHOCARDIOGRAM COMPLETE
CHL CUP DOP CALC LVOT VTI: 27.2 cm
E decel time: 335 msec
EERAT: 9.3
FS: 35 % (ref 28–44)
IV/PV OW: 0.96
LA diam end sys: 38 mm
LA vol A4C: 35 ml
LA vol index: 18.9 mL/m2
LADIAMINDEX: 1.95 cm/m2
LASIZE: 38 mm
LAVOL: 36.9 mL
LV E/e' medial: 9.3
LV E/e'average: 9.3
LV TDI E'MEDIAL: 4.99
LVELAT: 6.83 cm/s
LVOT area: 3.14 cm2
LVOT diameter: 20 mm
LVOT peak vel: 109 cm/s
LVOTSV: 85 mL
MV Dec: 335
MVPKAVEL: 109 m/s
MVPKEVEL: 63.5 m/s
PW: 10.7 mm — AB (ref 0.6–1.1)
TAPSE: 22.1 mm
TDI e' lateral: 6.83

## 2015-12-18 ENCOUNTER — Telehealth: Payer: Self-pay | Admitting: Internal Medicine

## 2015-12-18 NOTE — Telephone Encounter (Signed)
Patient notified of results. Reminded of upcoming appointment on Monday 7/17.

## 2015-12-18 NOTE — Telephone Encounter (Signed)
New Message  Pt call requesting to speak with RN about Echo results. Please call back to discuss

## 2015-12-21 DIAGNOSIS — Z23 Encounter for immunization: Secondary | ICD-10-CM | POA: Diagnosis not present

## 2015-12-21 DIAGNOSIS — E1165 Type 2 diabetes mellitus with hyperglycemia: Secondary | ICD-10-CM | POA: Diagnosis not present

## 2015-12-21 DIAGNOSIS — E039 Hypothyroidism, unspecified: Secondary | ICD-10-CM | POA: Diagnosis not present

## 2015-12-21 DIAGNOSIS — E038 Other specified hypothyroidism: Secondary | ICD-10-CM | POA: Diagnosis not present

## 2015-12-21 DIAGNOSIS — Z794 Long term (current) use of insulin: Secondary | ICD-10-CM | POA: Diagnosis not present

## 2015-12-22 DIAGNOSIS — M5416 Radiculopathy, lumbar region: Secondary | ICD-10-CM | POA: Diagnosis not present

## 2015-12-22 DIAGNOSIS — I771 Stricture of artery: Secondary | ICD-10-CM | POA: Diagnosis not present

## 2015-12-22 DIAGNOSIS — I6601 Occlusion and stenosis of right middle cerebral artery: Secondary | ICD-10-CM | POA: Diagnosis not present

## 2015-12-25 ENCOUNTER — Encounter: Payer: Self-pay | Admitting: Internal Medicine

## 2015-12-25 ENCOUNTER — Ambulatory Visit (INDEPENDENT_AMBULATORY_CARE_PROVIDER_SITE_OTHER): Payer: Medicare Other | Admitting: Internal Medicine

## 2015-12-25 VITALS — BP 120/62 | HR 70 | Ht 63.5 in | Wt 194.4 lb

## 2015-12-25 DIAGNOSIS — I10 Essential (primary) hypertension: Secondary | ICD-10-CM

## 2015-12-25 DIAGNOSIS — I4510 Unspecified right bundle-branch block: Secondary | ICD-10-CM | POA: Diagnosis not present

## 2015-12-25 DIAGNOSIS — R0600 Dyspnea, unspecified: Secondary | ICD-10-CM | POA: Diagnosis not present

## 2015-12-25 DIAGNOSIS — I5031 Acute diastolic (congestive) heart failure: Secondary | ICD-10-CM | POA: Diagnosis not present

## 2015-12-25 HISTORY — DX: Acute diastolic (congestive) heart failure: I50.31

## 2015-12-25 MED ORDER — FUROSEMIDE 20 MG PO TABS: 40.0000 mg | ORAL_TABLET | Freq: Every day | ORAL | Status: DC | PRN

## 2015-12-25 NOTE — Progress Notes (Signed)
Cardiology Office Note   Date:  12/25/2015   ID:  LYZA BRISKEY, DOB 13-Apr-1941, MRN [id number]  PCP:  Donnie Coffin, MD  Cardiologist: Darlin Coco MD  Chief Complaint  Patient presents with  . Follow-up    echo      History of Present Illness: MARDELLE [facility] is a 75 y.o. female who presents for  Four-month follow-up visit  This pleasant 75 year old woman is seen for a scheduled followup visit. The patient has a history of having had a stroke in early December 2014. She was at her diabetes clinic at [facility] and they diagnosed her and sent her straight to neurology where she was hospitalized for 3 days. The MRI showed a new stroke on the right side. She had carotid Dopplers which did not show any obstructive lesions but she did have some plaque and they put her back on aspirin and continued her Plavix. The patient had a echocardiogram which was a bubble study and did not show any evidence of right-to-left shunt. More recently, she has had further neurology workup at [facility] on 06/20/14. She had a transcranial Doppler. 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 Previous carotid Dopplers had not shown any significant external cranial disease She has a history of essential hypertension, diabetes mellitus, and dyslipidemia. She has had atypical chest pain. She is intolerant of statin drugs. We updated her lexiscan Myoview stress test on 03/17/12 and it was normal showing no evidence of ischemia and her ejection fraction was 78%. The patient has never had a cardiac catheterization. She has continued to have some shortness of breath. She had an echocardiogram in 04/07/12 which showed an ejection fraction of 55-65% with grade 1 diastolic dysfunction. There is trivial aortic insufficiency.  The patient has a history of essential  hypertension as well as atypical chest pain. Her diabetes is now being followed at the [facility] clinic at [facility]. Since we last saw her she had an ophthalmology evaluation on 07/27/14 by Dr. Kathrin Penner and no diabetic retinopathy was found. Her last chest x-ray was on 12/21/12 elsewhere and showed a normal heart size and low lung volumes. She had a recent CT scan of the chest on 03/23/13 at [facility] which showed normal heart size and demonstrated atherosclerosis of the aorta.  The patient has a past history of dermatofibrosarcoma of the spine. She had surgery for this at [facility]. She also has a past history of angiolipoma of the abdominal wall.  Since last visit she has not been experiencing any new cardiac symptoms. She does have sleep apnea. She uses a CPAP machine. She has had some low back pain and pain in her hip radiating to her knees. She has seen Dr. Durward Fortes. She was admitted to the hospital on 01/18/15 for chest and abdominal pain. She had a CTA of the chest on 01/18/15 which was negative for pulmonary embolus. The following day she had a Myoview on 01/19/15 which showed no ischemia and her ejection fraction was 85%. She had a lot of side effects from the [facility]. It was felt in retrospect that her presenting complaints were probably GI in origin rather than cardiac. She does have a history of GERD and sees Dr. Oletta Lamas.  since last visit she has had no new cardiac symptoms.  She has not been having any recent chest discomfort.  Her weight is unchanged and her blood pressures been stable.  11/22/2015  [facility] presents today to establish care with me. She is a previous patient of Dr. Warren Danes. She recently was at the beach and developed some worsening shortness of breath and leg swelling. Her nephew was there who came down from [place] with a head cold and she seems to have symptoms now at this time. She was also seen at an urgent care there and  placed on amoxicillin. She's taken that for a few days. She notes significant swelling in her weight is much higher than it had been in the past. She endorses a high salt intake. Her last echocardiogram was in 2013 which showed an EF of 55-65% with grade 1 diastolic dysfunction. She had a stress test in 2016 for chest pain which showed an EF of 85%.  12/25/2015  I saw Whitney [facility] that today in the office. She has been taking the Lasix 40 mg daily which was over a week and this resulted in significant diuresis. Her weight had reduced from 202 down to 189. She then had to stop it for a few days because of some other medical problems are currently weight is up to 194. Her BNP associate with this was very low at 8 and renal function appeared to be stable. She reports a 75% improvement in breathing. Her repeat echo shows that EF remains stable with grade 1 diastolic dysfunction.  Past Medical History  Diagnosis Date  . Hypertension   . Thyroid disease     hypothyroidism  . Gastritis   . Diverticulitis   . Back pain     prior back surgery in 2009 - slow to recover  . Diabetes ([place])   . HLD (hyperlipidemia)     statin intolerant  . Normal cardiac stress test 2011  . Obesity   . Hemangioma of liver     managed conservatively; followed at [facility]  . Cancer [facility])     Past Surgical History  Procedure Laterality Date  . Abdominal hysterectomy    . Nasal reconstruction    . Appendectomy       Current Outpatient Prescriptions  Medication Sig Dispense Refill  . aspirin 81 MG tablet Take 81 mg by mouth daily.    . bisoprolol-hydrochlorothiazide (ZIAC) 10-6.25 MG per tablet Take 1 tablet by mouth daily. 90 tablet 3  . clopidogrel (PLAVIX) 75 MG tablet Take 1 tablet (75 mg total) by mouth daily. 90 tablet 3  . colesevelam (WELCHOL) 625 MG tablet Take 3 tablets (1,875 mg total) by mouth 2 (two) times daily as needed (cholesterol). 180 tablet 6  . cyclobenzaprine (FLEXERIL) 5 MG tablet Take 2.5 mg  by mouth daily as needed (muscle spasms).     . furosemide (LASIX) 20 MG tablet Take 2 tablets (40 mg total) by mouth daily as needed. For weight gain 180 tablet 1  . gabapentin (NEURONTIN) 100 MG capsule Take 200 mg by mouth daily as needed (nerve pain).     Marland Kitchen glucose blood (CVS BLOOD GLUCOSE TEST STRIPS) test strip Used to check Blood Sugar 4 times daily. Dx. Code 250.00    . hydrochlorothiazide (HYDRODIURIL) 25 MG tablet Take 6.25 mg by mouth daily.    . hyoscyamine (LEVBID) 0.375 MG 12 hr tablet Take 0.375 mg by mouth every 12 (twelve) hours as needed (cramps).     . insulin glargine (LANTUS) 100 UNIT/ML injection Inject 32 Units into the skin every morning.     Marland Kitchen  Lancets (ACCU-CHEK MULTICLIX) lancets Used to check Blood Sugar 4 times daily. Dx. Code 250.00.    Marland Kitchen levothyroxine (SYNTHROID, LEVOTHROID) 125 MCG tablet Take 125 mcg by mouth daily.    Marland Kitchen losartan (COZAAR) 25 MG tablet Take 1 tablet (25 mg total) by mouth daily. 90 tablet 3  . nitroGLYCERIN (NITROSTAT) 0.4 MG SL tablet DISSOLVE ONE TABLET UNDER TONGUE AS NEEDED FOR ARM/CHEST PAIN. 25 tablet PRN  . potassium chloride SA (K-DUR,KLOR-CON) 20 MEQ tablet Take 1 tablet (20 mEq total) by mouth daily. 30 tablet 5   No current facility-administered medications for this visit.    Allergies:   Celecoxib; Ropinirole hcl; Actos; Crestor; Doxycycline; Fenofibrate; Imdur; Lipitor; Metformin and related; Niaspan; Pioglitazone; Pravachol; Ranolazine er; Rosuvastatin; and Zetia    Social History:  The patient  reports that she has never smoked. She has never used smokeless tobacco. She reports that she does not drink alcohol or use illicit drugs.   Family History:  The patient's family history includes Dementia in her father; Heart attack in her brother and father; Heart disease in her brother and father; Hypertension in her mother; Stroke in her mother.    ROS:  Please see the history of present illness.   Otherwise, review of systems are  positive for none.   All other systems are reviewed and negative.    PHYSICAL EXAM: VS:  BP 120/62 mmHg  Pulse 70  Ht 5' 3.5" (1.613 m)  Wt 194 lb 6.4 oz (88.179 kg)  BMI 33.89 kg/m2 , BMI Body mass index is 33.89 kg/(m^2). Extremities: No significant pitting edema  EKG:   Deferred  Recent Labs: 01/18/2015: Hemoglobin 14.2; Platelets 236 11/15/2015: ALT 14 11/29/2015: Brain Natriuretic Peptide 8.4; BUN 19; Creat 0.85; Potassium 4.1; Sodium 138    Lipid Panel    Component Value Date/Time   CHOL 209* 11/15/2015 0954   TRIG 102 11/15/2015 0954   HDL 55 11/15/2015 0954   CHOLHDL 3.8 11/15/2015 0954   VLDL 20 11/15/2015 0954   LDLCALC 134* 11/15/2015 0954   LDLDIRECT 148.7 09/14/2012 0857      Wt Readings from Last 3 Encounters:  12/25/15 194 lb 6.4 oz (88.179 kg)  11/22/15 202 lb (91.627 kg)  07/13/15 196 lb 12.8 oz (89.268 kg)     ASSESSMENT AND PLAN:  1. Acute diastolic congestive heart failure 2. Hypertensive heart disease without heart failure 3. Diabetes mellitus. Recent A1c at the [facility] clinic was 8.0 4. Atypical chest pain with normal Myoview stress test in August 2016. No ischemia. Ejection fraction 85%. 41. old CVA followed at [facility] 6. History of dermatofibro- sarcoma of the spine. 7. Dyslipidemia 8. Osteoarthritis 9. OSA on CPAP [facility]) 10. Hypothyroidism followed at [facility]. 11. GERD, followed by Dr. Oletta Lamas   PLAN: 1. [facility] has improvement in her swelling with a significant 8 pound weight loss after diuresis. She does not want to take long-term Lasix however it may be necessary due to diastolic dysfunction. I advised her to be on 20 mg every day but she wishes to take up to 40 mg as needed based on a sliding scale. I've given her that sliding scale today and will prescribe her some 20 mg tablets for which she can take 1-2 as needed.  Follow-up in 3 months.  Pixie Casino, MD, [facility] Attending Cardiologist [facility]    12/25/2015 5:28 PM

## 2015-12-25 NOTE — Patient Instructions (Addendum)
Your physician recommends that you schedule a follow-up appointment in [place] with Dr. Debara Pickett.  If you gain 2-3 lbs in 2 days - take lasix 40mg  daily for 3 days -- could consider lasix 20mg  daily for maintenance   distolic heart failure heart failure with preserved ejection fraction

## 2016-01-02 DIAGNOSIS — E278 Other specified disorders of adrenal gland: Secondary | ICD-10-CM | POA: Diagnosis not present

## 2016-01-02 DIAGNOSIS — D1803 Hemangioma of intra-abdominal structures: Secondary | ICD-10-CM | POA: Diagnosis not present

## 2016-01-02 DIAGNOSIS — K8689 Other specified diseases of pancreas: Secondary | ICD-10-CM | POA: Diagnosis not present

## 2016-01-02 DIAGNOSIS — C7A8 Other malignant neuroendocrine tumors: Secondary | ICD-10-CM | POA: Diagnosis not present

## 2016-01-09 ENCOUNTER — Telehealth: Payer: Self-pay | Admitting: Internal Medicine

## 2016-01-09 NOTE — Telephone Encounter (Signed)
New message      Pt has an appt with Dr Debara Pickett (former Dr Mare Ferrari pt) on 03-13-16 for an follow up.  Prior to appts with Dr Mare Ferrari, he ordered blood work.  Will Dr Debara Pickett want pt to come in a few days early for labs?

## 2016-01-09 NOTE — Telephone Encounter (Signed)
Advised patient

## 2016-01-09 NOTE — Telephone Encounter (Signed)
No labs prior to the appointment given her recent labs in June. Thanks.  Dr. Lemmie Evens

## 2016-01-09 NOTE — Telephone Encounter (Signed)
Spoke with patient and advised no labs ordered and cholesterol checked in June so he may not want to recheck this soon. Any other labs needed would not need to be fasting so can get at appointment.  Will forward to Dr Debara Pickett to make sure no labs needed prior to [place]

## 2016-01-25 ENCOUNTER — Other Ambulatory Visit: Payer: Self-pay | Admitting: Internal Medicine

## 2016-01-25 NOTE — Telephone Encounter (Signed)
REFILL 

## 2016-02-13 ENCOUNTER — Other Ambulatory Visit: Payer: Self-pay

## 2016-02-13 ENCOUNTER — Other Ambulatory Visit: Payer: Self-pay | Admitting: Internal Medicine

## 2016-03-06 ENCOUNTER — Other Ambulatory Visit: Payer: Self-pay | Admitting: Internal Medicine

## 2016-03-06 NOTE — Telephone Encounter (Signed)
Rx(s) sent to pharmacy electronically.  

## 2016-03-11 DIAGNOSIS — Z7982 Long term (current) use of aspirin: Secondary | ICD-10-CM | POA: Diagnosis not present

## 2016-03-11 DIAGNOSIS — Z8673 Personal history of transient ischemic attack (TIA), and cerebral infarction without residual deficits: Secondary | ICD-10-CM | POA: Diagnosis not present

## 2016-03-11 DIAGNOSIS — E1165 Type 2 diabetes mellitus with hyperglycemia: Secondary | ICD-10-CM | POA: Diagnosis not present

## 2016-03-11 DIAGNOSIS — E7800 Pure hypercholesterolemia, unspecified: Secondary | ICD-10-CM | POA: Diagnosis not present

## 2016-03-11 DIAGNOSIS — Z794 Long term (current) use of insulin: Secondary | ICD-10-CM | POA: Diagnosis not present

## 2016-03-11 DIAGNOSIS — Z79899 Other long term (current) drug therapy: Secondary | ICD-10-CM | POA: Diagnosis not present

## 2016-03-11 DIAGNOSIS — E785 Hyperlipidemia, unspecified: Secondary | ICD-10-CM | POA: Diagnosis not present

## 2016-03-11 DIAGNOSIS — I10 Essential (primary) hypertension: Secondary | ICD-10-CM | POA: Diagnosis not present

## 2016-03-11 DIAGNOSIS — E039 Hypothyroidism, unspecified: Secondary | ICD-10-CM | POA: Diagnosis not present

## 2016-03-13 ENCOUNTER — Encounter: Payer: Self-pay | Admitting: Internal Medicine

## 2016-03-13 ENCOUNTER — Ambulatory Visit (INDEPENDENT_AMBULATORY_CARE_PROVIDER_SITE_OTHER): Payer: Medicare Other | Admitting: Internal Medicine

## 2016-03-13 VITALS — BP 140/76 | HR 70 | Ht 63.0 in | Wt 192.0 lb

## 2016-03-13 DIAGNOSIS — R600 Localized edema: Secondary | ICD-10-CM

## 2016-03-13 DIAGNOSIS — I10 Essential (primary) hypertension: Secondary | ICD-10-CM | POA: Diagnosis not present

## 2016-03-13 DIAGNOSIS — K219 Gastro-esophageal reflux disease without esophagitis: Secondary | ICD-10-CM | POA: Diagnosis not present

## 2016-03-13 DIAGNOSIS — E785 Hyperlipidemia, unspecified: Secondary | ICD-10-CM

## 2016-03-13 DIAGNOSIS — R0602 Shortness of breath: Secondary | ICD-10-CM

## 2016-03-13 NOTE — Patient Instructions (Signed)
Medication Instructions:  Your physician recommends that you continue on your current medications as directed. Please refer to the Current Medication list given to you today.  Labwork: NONE   Testing/Procedures: NONE  Follow-Up: Your physician recommends that you schedule a follow-up appointment in: 3 months with Dr Debara Pickett  Any Other Special Instructions Will Be Listed Below (If Applicable).    If you need a refill on your cardiac medications before your next appointment, please call your pharmacy.  NEXT APPT:  DATE:___________________________________  TIME:_______________________________AM/PM

## 2016-03-13 NOTE — Progress Notes (Signed)
Cardiology Office Note   Date:  03/13/2016   ID:  Whitney Newton, DOB 11-19-1940, MRN [id number]  PCP:  Donnie Coffin, MD   CC: Breathing and swelling have improved, occasional chest pain responsive to nitroglycerin  History of Present Illness: Whitney Newton is a 75 y.o. female who presents for  Four-month follow-up visit  This pleasant 75 year old woman is seen for a scheduled followup visit. The patient has a history of having had a stroke in early December 2014. She was at her diabetes clinic at [facility] and they diagnosed her and sent her straight to neurology where she was hospitalized for 3 days. The MRI showed a new stroke on the right side. She had carotid Dopplers which did not show any obstructive lesions but she did have some plaque and they put her back on aspirin and continued her Plavix. The patient had a echocardiogram which was a bubble study and did not show any evidence of right-to-left shunt. More recently, she has had further neurology workup at [facility] on 06/20/14. She had a transcranial Doppler. 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 Previous carotid Dopplers had not shown any significant external cranial disease She has a history of essential hypertension, diabetes mellitus, and dyslipidemia. She has had atypical chest pain. She is intolerant of statin drugs. We updated her lexiscan Myoview stress test on 03/17/12 and it was normal showing no evidence of ischemia and her ejection fraction was 78%. The patient has never had a cardiac catheterization. She has continued to have some shortness of breath. She had an echocardiogram in 04/07/12 which showed an ejection fraction of 55-65% with grade 1 diastolic dysfunction. There is trivial aortic insufficiency.  The patient has a history of essential hypertension as  well as atypical chest pain. Her diabetes is now being followed at the [facility] clinic at [facility]. Since we last saw her she had an ophthalmology evaluation on 07/27/14 by Dr. Kathrin Penner and no diabetic retinopathy was found. Her last chest x-ray was on 12/21/12 elsewhere and showed a normal heart size and low lung volumes. She had a recent CT scan of the chest on 03/23/13 at [facility] which showed normal heart size and demonstrated atherosclerosis of the aorta.  The patient has a past history of dermatofibrosarcoma of the spine. She had surgery for this at [facility]. She also has a past history of angiolipoma of the abdominal wall.  Since last visit she has not been experiencing any new cardiac symptoms. She does have sleep apnea. She uses a CPAP machine. She has had some low back pain and pain in her hip radiating to her knees. She has seen Dr. Durward Fortes. She was admitted to the hospital on 01/18/15 for chest and abdominal pain. She had a CTA of the chest on 01/18/15 which was negative for pulmonary embolus. The following day she had a Myoview on 01/19/15 which showed no ischemia and her ejection fraction was 85%. She had a lot of side effects from the [facility]. It was felt in retrospect that her presenting complaints were probably GI in origin rather than cardiac. She does have a history of GERD and sees Dr. Oletta Lamas.  since last visit she has had no new cardiac symptoms.  She has not been having any recent chest discomfort. Her weight is unchanged and her blood pressures been  stable.  11/22/2015  Whitney Newton presents today to establish care with me. She is a previous patient of Dr. Warren Danes. [place] the beach and developed some worsening shortness of breath and leg swelling. Her nephew was there who came down from [place] with a head cold and she seems to have symptoms now at this time. She was also seen at an urgent care there and placed on  amoxicillin. She's taken that for a few days. She notes significant swelling in her weight is much higher than it had been in the past. She endorses a high salt intake. Her last echocardiogram was in 2013 which showed an EF of 55-65% with grade 1 diastolic dysfunction. She had a stress test in 2016 for chest pain which showed an EF of 85%.  12/25/2015  I saw Whitney Newton that today in the office. She has been taking the Lasix 40 mg daily which was over a week and this resulted in significant diuresis. Her weight had reduced from 202 down to 189. She then had to stop it for a few days because of some other medical problems are currently weight is up to 194. Her BNP associate with this was very low at 8 and renal function appeared to be stable. She reports a 75% improvement in breathing. Her repeat echo shows that EF remains stable with grade 1 diastolic dysfunction.  03/13/2016  Whitney Newton reports she's had a marked improvement in her swelling and breathing with additional diuretics. Weight has been fairly stable. She reports one episode of chest pain which was responsive to nitroglycerin. She's also had some reflux symptoms and is currently on Zegerid which appears to be helping her symptoms. Is not clear whether these episodes of chest discomfort are related to reflux or coronary ischemia. She did have a negative Myoview stress test in August 2016.  Past Medical History:  Diagnosis Date  . Acute diastolic (congestive) heart failure 12/25/2015  . Back pain    prior back surgery in 2009 - slow to recover  . Cancer ([place])   . Diabetes ([place])   . Diverticulitis   . Gastritis   . Hemangioma of liver    managed conservatively; followed at [facility] (hyperlipidemia)    statin intolerant  . Hypertension   . Normal cardiac stress test 2011  . Obesity   . Thyroid disease    hypothyroidism    Past Surgical History:  Procedure Laterality Date  . ABDOMINAL HYSTERECTOMY    . APPENDECTOMY    .  NASAL RECONSTRUCTION       Current Outpatient Prescriptions  Medication Sig Dispense Refill  . aspirin 81 MG tablet Take 81 mg by mouth daily.    . bisoprolol-hydrochlorothiazide (ZIAC) 10-6.25 MG tablet Take 1 tablet by mouth daily. 90 tablet 0  . clopidogrel (PLAVIX) 75 MG tablet Take 1 tablet (75 mg total) by mouth daily. KEEP OV. 90 tablet 0  . colesevelam (WELCHOL) 625 MG tablet Take 3 tablets (1,875 mg total) by mouth 2 (two) times daily as needed (cholesterol). 180 tablet 6  . cyclobenzaprine (FLEXERIL) 5 MG tablet Take 2.5 mg by mouth daily as needed (muscle spasms).     . furosemide (LASIX) 20 MG tablet Take 2 tablets (40 mg total) by mouth daily as needed. For weight gain 180 tablet 1  . gabapentin (NEURONTIN) 100 MG capsule Take 200 mg by mouth daily as needed (nerve pain).     Marland Kitchen glucose blood ([employer]  BLOOD GLUCOSE TEST STRIPS) test strip Used to check Blood Sugar 4 times daily. Dx. Code 250.00    . hydrochlorothiazide (HYDRODIURIL) 25 MG tablet Take 6.25 mg by mouth daily.    . hyoscyamine (LEVBID) 0.375 MG 12 hr tablet Take 0.375 mg by mouth every 12 (twelve) hours as needed (cramps).     . insulin glargine (LANTUS) 100 UNIT/ML injection Inject 32 Units into the skin every morning.     . Lancets (ACCU-CHEK MULTICLIX) lancets Used to check Blood Sugar 4 times daily. Dx. Code 250.00.    Marland Kitchen levothyroxine (SYNTHROID, LEVOTHROID) 125 MCG tablet Take 125 mcg by mouth daily.    Marland Kitchen losartan (COZAAR) 25 MG tablet TAKE 1 TABLET ONCE DAILY. 90 tablet 2  . nitroGLYCERIN (NITROSTAT) 0.4 MG SL tablet DISSOLVE ONE TABLET UNDER TONGUE AS NEEDED FOR ARM/CHEST PAIN. 25 tablet PRN  . potassium chloride SA (K-DUR,KLOR-CON) 20 MEQ tablet Take 1 tablet (20 mEq total) by mouth daily. 30 tablet 5   No current facility-administered medications for this visit.     Allergies:   Celecoxib; Ropinirole hcl; Actos [pioglitazone hydrochloride]; Crestor [rosuvastatin calcium]; Doxycycline; Fenofibrate; Imdur  [isosorbide nitrate]; Lipitor [atorvastatin calcium]; Metformin and related; Niaspan [niacin er]; Pioglitazone; Pravachol; Ranolazine er; Rosuvastatin; and Zetia [ezetimibe]    Social History:  The patient  reports that she has never smoked. She has never used smokeless tobacco. She reports that she does not drink alcohol or use drugs.   Family History:  The patient's family history includes Dementia in her father; Heart attack in her brother and father; Heart disease in her brother and father; Hypertension in her mother; Stroke in her mother.    ROS:  Please see the history of present illness.   Otherwise, review of systems are positive for none.   All other systems are reviewed and negative.    PHYSICAL EXAM: VS:  BP 140/76   Pulse 70   Ht 5\' 3"  (1.6 m)   Wt 192 lb (87.1 kg)   BMI 34.01 kg/m  , BMI Body mass index is 34.01 kg/m. Extremities: No significant pitting edema  EKG:   Deferred  Recent Labs: 11/15/2015: ALT 14 11/29/2015: Brain Natriuretic Peptide 8.4; BUN 19; Creat 0.85; Potassium 4.1; Sodium 138    Lipid Panel    Component Value Date/Time   CHOL 209 (H) 11/15/2015 0954   TRIG 102 11/15/2015 0954   HDL 55 11/15/2015 0954   CHOLHDL 3.8 11/15/2015 0954   VLDL 20 11/15/2015 0954   LDLCALC 134 (H) 11/15/2015 0954   LDLDIRECT 148.7 09/14/2012 0857      Wt Readings from Last 3 Encounters:  03/13/16 192 lb (87.1 kg)  12/25/15 194 lb 6.4 oz (88.2 kg)  11/22/15 202 lb (91.6 kg)     ASSESSMENT AND PLAN:  1. Acute diastolic congestive heart failure 2. Hypertensive heart disease without heart failure 3. Diabetes mellitus. Recent A1c at the [facility] clinic was 8.0 4. Atypical chest pain with normal Myoview stress test in August 2016. No ischemia. Ejection fraction 85%. 73. old CVA followed at [facility] 6. History of dermatofibro- sarcoma of the spine. 7. Dyslipidemia 8. Osteoarthritis 9. OSA on CPAP [facility]) 10. Hypothyroidism followed at  [facility]. 11. GERD, followed by Dr. Oletta Lamas   PLAN: 1. Mr. Jobe Newton Seems to be doing well with sliding scale diuretics. She generally takes 20 mg daily but can take up to 40 mg. Weight is been stable. She's had improvement in her breathlessness and no worsening edema.  She occasionally gets chest discomfort which she takes nitro IV. Not clear if this is angina or not. Her stress test was negative in August 2016. If she requires any more nitroglycerin or has more frequent episodes of angina, and advised her to contact her office and she may need to be scheduled for cardiac catheterization.  Follow-up in 6 months.  Pixie Casino, MD, [facility] Attending Cardiologist [facility]   03/13/2016 2:58 PM

## 2016-03-19 ENCOUNTER — Other Ambulatory Visit: Payer: Self-pay | Admitting: Gastroenterology

## 2016-03-19 DIAGNOSIS — D1803 Hemangioma of intra-abdominal structures: Secondary | ICD-10-CM | POA: Diagnosis not present

## 2016-03-19 DIAGNOSIS — K869 Disease of pancreas, unspecified: Secondary | ICD-10-CM | POA: Diagnosis not present

## 2016-03-19 DIAGNOSIS — R1310 Dysphagia, unspecified: Secondary | ICD-10-CM | POA: Diagnosis not present

## 2016-03-19 DIAGNOSIS — Z86010 Personal history of colonic polyps: Secondary | ICD-10-CM | POA: Diagnosis not present

## 2016-03-19 DIAGNOSIS — I679 Cerebrovascular disease, unspecified: Secondary | ICD-10-CM | POA: Diagnosis not present

## 2016-03-19 DIAGNOSIS — K219 Gastro-esophageal reflux disease without esophagitis: Secondary | ICD-10-CM | POA: Diagnosis not present

## 2016-03-20 ENCOUNTER — Ambulatory Visit
Admission: RE | Admit: 2016-03-20 | Discharge: 2016-03-20 | Disposition: A | Discharge status: Home / Self Care | Payer: Medicare Other | Source: Ambulatory Visit | Attending: Gastroenterology | Admitting: Gastroenterology

## 2016-03-20 DIAGNOSIS — E038 Other specified hypothyroidism: Secondary | ICD-10-CM | POA: Diagnosis not present

## 2016-03-20 DIAGNOSIS — K449 Diaphragmatic hernia without obstruction or gangrene: Secondary | ICD-10-CM | POA: Diagnosis not present

## 2016-03-20 DIAGNOSIS — R1310 Dysphagia, unspecified: Secondary | ICD-10-CM

## 2016-04-04 DIAGNOSIS — J069 Acute upper respiratory infection, unspecified: Secondary | ICD-10-CM | POA: Diagnosis not present

## 2016-04-05 DIAGNOSIS — Z1211 Encounter for screening for malignant neoplasm of colon: Secondary | ICD-10-CM | POA: Diagnosis not present

## 2016-04-12 ENCOUNTER — Other Ambulatory Visit: Payer: Self-pay | Admitting: Internal Medicine

## 2016-04-19 DIAGNOSIS — J209 Acute bronchitis, unspecified: Secondary | ICD-10-CM | POA: Diagnosis not present

## 2016-04-24 DIAGNOSIS — Z961 Presence of intraocular lens: Secondary | ICD-10-CM | POA: Diagnosis not present

## 2016-04-24 DIAGNOSIS — H524 Presbyopia: Secondary | ICD-10-CM | POA: Diagnosis not present

## 2016-04-24 DIAGNOSIS — H43813 Vitreous degeneration, bilateral: Secondary | ICD-10-CM | POA: Diagnosis not present

## 2016-04-24 DIAGNOSIS — E103292 Type 1 diabetes mellitus with mild nonproliferative diabetic retinopathy without macular edema, left eye: Secondary | ICD-10-CM | POA: Diagnosis not present

## 2016-04-25 DIAGNOSIS — I771 Stricture of artery: Secondary | ICD-10-CM | POA: Diagnosis not present

## 2016-04-25 DIAGNOSIS — I10 Essential (primary) hypertension: Secondary | ICD-10-CM | POA: Diagnosis not present

## 2016-04-30 ENCOUNTER — Other Ambulatory Visit: Payer: Self-pay | Admitting: Internal Medicine

## 2016-05-03 ENCOUNTER — Other Ambulatory Visit: Payer: Self-pay | Admitting: Internal Medicine

## 2016-05-28 DIAGNOSIS — K869 Disease of pancreas, unspecified: Secondary | ICD-10-CM | POA: Diagnosis not present

## 2016-05-28 DIAGNOSIS — D1803 Hemangioma of intra-abdominal structures: Secondary | ICD-10-CM | POA: Diagnosis not present

## 2016-05-28 DIAGNOSIS — K219 Gastro-esophageal reflux disease without esophagitis: Secondary | ICD-10-CM | POA: Diagnosis not present

## 2016-05-28 DIAGNOSIS — Z86010 Personal history of colonic polyps: Secondary | ICD-10-CM | POA: Diagnosis not present

## 2016-05-28 DIAGNOSIS — I679 Cerebrovascular disease, unspecified: Secondary | ICD-10-CM | POA: Diagnosis not present

## 2016-05-28 DIAGNOSIS — R1310 Dysphagia, unspecified: Secondary | ICD-10-CM | POA: Diagnosis not present

## 2016-05-28 DIAGNOSIS — E119 Type 2 diabetes mellitus without complications: Secondary | ICD-10-CM | POA: Diagnosis not present

## 2016-05-28 DIAGNOSIS — Z794 Long term (current) use of insulin: Secondary | ICD-10-CM | POA: Diagnosis not present

## 2016-06-19 DIAGNOSIS — Z23 Encounter for immunization: Secondary | ICD-10-CM | POA: Diagnosis not present

## 2016-07-13 ENCOUNTER — Other Ambulatory Visit: Payer: Self-pay | Admitting: Internal Medicine

## 2016-07-15 ENCOUNTER — Other Ambulatory Visit: Payer: Self-pay | Admitting: Internal Medicine

## 2016-07-26 DIAGNOSIS — H8111 Benign paroxysmal vertigo, right ear: Secondary | ICD-10-CM | POA: Diagnosis not present

## 2016-07-26 DIAGNOSIS — R42 Dizziness and giddiness: Secondary | ICD-10-CM | POA: Diagnosis not present

## 2016-07-26 DIAGNOSIS — Z79899 Other long term (current) drug therapy: Secondary | ICD-10-CM | POA: Diagnosis not present

## 2016-07-26 DIAGNOSIS — G4739 Other sleep apnea: Secondary | ICD-10-CM | POA: Diagnosis not present

## 2016-07-26 DIAGNOSIS — I6602 Occlusion and stenosis of left middle cerebral artery: Secondary | ICD-10-CM | POA: Diagnosis not present

## 2016-07-26 DIAGNOSIS — Z8673 Personal history of transient ischemic attack (TIA), and cerebral infarction without residual deficits: Secondary | ICD-10-CM | POA: Diagnosis not present

## 2016-07-26 DIAGNOSIS — Z9989 Dependence on other enabling machines and devices: Secondary | ICD-10-CM | POA: Diagnosis not present

## 2016-08-01 ENCOUNTER — Ambulatory Visit: Payer: Medicare Other | Attending: Neurology | Admitting: Rehabilitative and Restorative Service Providers"

## 2016-08-01 DIAGNOSIS — H8111 Benign paroxysmal vertigo, right ear: Secondary | ICD-10-CM | POA: Insufficient documentation

## 2016-08-01 DIAGNOSIS — R2681 Unsteadiness on feet: Secondary | ICD-10-CM

## 2016-08-01 DIAGNOSIS — R2689 Other abnormalities of gait and mobility: Secondary | ICD-10-CM

## 2016-08-01 NOTE — Therapy (Signed)
[facility] [address] [place], [place], [address] Phone: [phone]   Fax:  [phone]  Physical Therapy Evaluation  Patient Details  Name: Whitney Newton MRN: [id number] Date of Birth: 05-24-1941 Referring Provider: Marianna Payment, MD  Encounter Date: 08/01/2016      PT End of Session - 08/01/16 1901    Visit Number 1   Number of Visits 4   Date for PT Re-Evaluation 08/31/16   Authorization Type G code every 10th visit   PT Start Time 1022   PT Stop Time 1106   PT Time Calculation (min) 44 min   Activity Tolerance Patient tolerated treatment well   Behavior During Therapy [facility] for tasks assessed/performed      Past Medical History:  Diagnosis Date  . Acute diastolic (congestive) heart failure 12/25/2015  . Back pain    prior back surgery in 2009 - slow to recover  . Cancer ([place])   . Diabetes ([place])   . Diverticulitis   . Gastritis   . Hemangioma of liver    managed conservatively; followed at [facility] (hyperlipidemia)    statin intolerant  . Hypertension   . Normal cardiac stress test 2011  . Obesity   . Thyroid disease    hypothyroidism    Past Surgical History:  Procedure Laterality Date  . ABDOMINAL HYSTERECTOMY    . APPENDECTOMY    . NASAL RECONSTRUCTION      There were no vitals filed for this visit.       Subjective Assessment - 08/01/16 1030    Subjective The patient awoke one morning in January with spinning dizziness when rolling to the right.  She was prescribed meclizine and zofran for the nausea.  She notes symptoms are intermittent in nature.  Has been using rollater RW since onset of dizziness.   Pertinent History h/o 3 strokes (2005, 2014), spinal compression at T10, h/o migraine aura without headache,  CHF, back surgery 2009, diabetes mellitus, hypothyroidism, middle cerebral artery stenosis (right), lumbar radiculopathy   Patient Stated Goals "I'd atleast like  to get back to where I was" prior to onset of vertigo.            [facility] PT Assessment - 08/01/16 1036      Assessment   Medical Diagnosis BPPV, right   Referring Provider Marianna Payment, MD   Onset Date/Surgical Date --  06/2016   Prior Therapy had PT after CVAs and spinal surgeries     Precautions   Precautions Fall     Restrictions   Weight Bearing Restrictions No     Balance Screen   Has the patient fallen in the past 6 months No   Has the patient had a decrease in activity level because of a fear of falling?  Yes   Is the patient reluctant to leave their home because of a fear of falling?  No  won't go anywhere by herself, not driving     [place] residence   Living Arrangements Spouse/significant other   Type of [place] to enter   Entrance Stairs-Number of Steps 2   [place] One level   [place] - 4 wheels     Prior Function   Level of Independence Independent     Observation/Other Assessments   Focus on Therapeutic Outcomes (FOTO)  40%   Other  Surveys  Other Surveys   Dizziness Handicap Inventory [facility])  76%            Vestibular Assessment - 08/01/16 1040      Vestibular Assessment   General Observation Walks into clinic with rollater RW modified indep.     Symptom Behavior   Type of Dizziness Spinning   Frequency of Dizziness intermittent   Duration of Dizziness seconds   Aggravating Factors Rolling to right   Relieving Factors Head stationary     Occulomotor Exam   Occulomotor Alignment Normal   Spontaneous Absent   Gaze-induced Absent   Smooth Pursuits Saccades   Saccades Dysmetria     Vestibulo-Occular Reflex   VOR 1 Head Only (x 1 viewing) self regulated pace- patient able to tolerate     Positional Testing   Dix-Hallpike Dix-Hallpike Right;Dix-Hallpike Left   Sidelying Test Sidelying Right;Sidelying Left   Horizontal  Canal Testing Horizontal Canal Right;Horizontal Canal Left     Dix-Hallpike Right   Dix-Hallpike Right Duration none   Dix-Hallpike Right Symptoms No nystagmus     Dix-Hallpike Left   Dix-Hallpike Left Duration none   Dix-Hallpike Left Symptoms No nystagmus     Sidelying Right   Sidelying Right Duration mild sensation, no nystagmus viewed in room light   Sidelying Right Symptoms No nystagmus     Sidelying Left   Sidelying Left Duration none   Sidelying Left Symptoms No nystagmus     Horizontal Canal Right   Horizontal Canal Right Duration mild sensation   Horizontal Canal Right Symptoms Normal     Horizontal Canal Left   Horizontal Canal Left Duration none   Horizontal Canal Left Symptoms Normal                Vestibular Treatment/Exercise - 08/01/16 1105      Vestibular Treatment/Exercise   Vestibular Treatment Provided Habituation   Habituation Exercises Laruth Bouchard Daroff;Horizontal Roll     [employer]   Number of Reps  2   Symptom Description  attempted 2 times to determine if we could provoke nystagmus- mild nausea and dizziness noted, but no nystagmus viewed in room light     Horizontal Roll   Number of Reps  3   Symptom Description  provokes mild sensation of dizziness, but no nystagmus and improves with repetition               PT Education - 08/01/16 1856    Education provided Yes   Education Details HEP: habituation rolling and brandt daroff (recommended to perform within tolerance with assist from husband)   Person(s) Educated Patient;Spouse   Methods Explanation;Demonstration;Handout   Comprehension Verbalized understanding;Returned demonstration             PT Long Term Goals - 08/01/16 1910      PT LONG TERM GOAL #1   Title The patient will be able to perform HEP for habituation with husand's assist for mgmt of motion sensitivity.   Baseline Target date 08/31/2016   Time 4   Period Weeks     PT LONG TERM GOAL #2   Title The  patient will report no symptoms with positional testing.   Baseline Target date 08/31/2016   Time 4   Period Weeks     PT LONG TERM GOAL #3   Title The patient will be further assessed on balance measures and goals to follow, as indicated.   Baseline Target date 08/31/2016   Time 4   Period Weeks  PT LONG TERM GOAL #4   Title The patient will ambulate short household distances x 30 ft without device (prior level of function before onset of vertigo) with intermittent UE support.   Baseline Target date 08/31/2016   Time 4   Period Weeks     PT LONG TERM GOAL #5   Title Reduce DHI from 76% to < or equal to 50% to demo dec'd self perception of dizziness.   Baseline Target date 08/31/2016   Time 4   Period Weeks               Plan - 08/01/16 1915    Clinical Impression Statement The patient is a 76 year old female presenting to OP rehab with recent h/o positional vertigo with rolling to the right.  She notes a severe episode 2 days ago when looking down and has not had significant symptoms since that time (may have repositioned otoconio).  She notes mild nausea with sidelying tests and sensation that symptoms could be provoked, but no nystagmus or true spinning noted.  PT provided habituation and plans to f/u next week to ensure vertigo symptoms remain resolved.  Also, the patient notes a decrease in confidence with walking since onset of symptoms and may benefit from further HEP for balance/mobility.    Rehab Potential Good   PT Frequency 1x / week   PT Duration 4 weeks   PT Treatment/Interventions ADLs/Self Care Home Management;Canalith Repostioning;Vestibular;Neuromuscular re-education;Therapeutic activities;Therapeutic exercise;Gait training;Patient/family education;Functional mobility training   PT Next Visit Plan Recheck positional testing, provide balance/moblity HEP; tx BPPV as indicated.   Consulted and Agree with Plan of Care Patient      Patient will benefit from  skilled therapeutic intervention in order to improve the following deficits and impairments:  Abnormal gait, Decreased activity tolerance, Decreased balance, Dizziness, Difficulty walking  Visit Diagnosis: BPPV (benign paroxysmal positional vertigo), right  Other abnormalities of gait and mobility  Unsteadiness on feet     Problem List Patient Active Problem List   Diagnosis Date Noted  . Acute diastolic (congestive) heart failure 12/25/2015  . Dyspnea 11/22/2015  . Adrenal adenoma 02/14/2015  . Arthritis 02/14/2015  . Diabetes ([place]) 02/14/2015  . Diabetes mellitus type 2, uncontrolled ([place]) 02/14/2015  . Essential (primary) hypertension 02/14/2015  . HLD (hyperlipidemia) 02/14/2015  . BP (high blood pressure) 02/14/2015  . Disc disease with myelopathy, 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  . 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 Nerve root pain 02/14/2015  . Right hemisphere, 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[place]) 02/14/2015  . Cerebral vascular accident ([place]) 02/14/2015  . Benign essential HTN   . Esophageal reflux   . Hepatic hemangioma   . Acute kidney injury (nontraumatic) ([place]) 01/18/2015  . Diabetes mellitus type 2, controlled ([place]) 01/18/2015  . Pain in the chest   . Lumbar radiculopathy 12/02/2014  . RBBB 08/02/2014  . Cerebral atherosclerosis 12/08/2013  . Obstructive apnea 11/26/2013  . Cerebral infarct ([place]) 05/12/2013  . Atherosclerosis of aorta ([place]) 04/12/2013  . 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 Chest pain 04/12/2013  . Skin rash 01/15/2013  . 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/14/2012  . Fatty tumor 09/09/2012  . H/O malignant neoplasm of skin 09/09/2012  . CVA (cerebral vascular accident) ([place]) 07/31/2012  . Acquired aphasia 05/25/2012  . Neoplasm of uncertain behavior of skin 09/24/2011  . Bilateral edema of lower extremity 09/09/2011  . Diabetes mellitus, type 2 ([place]) 08/15/2011  . Carpal tunnel syndrome 08/07/2011  . Lesion,  thoracic root 08/07/2011  .  Cervical nerve root disorder 07/18/2011  . 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 Hypothyroidism 09/18/2010  . Benign hypertensive heart disease without heart failure 09/18/2010  . Dyslipidemia 09/18/2010  . Type II or unspecified type diabetes mellitus without mention of complication, uncontrolled 09/18/2010  . Hemangioma of liver 09/18/2010  . Postmenopausal state 09/18/2010  . Chest pain 09/18/2010  . Dizziness 09/18/2010    Yeilyn Gent, PT 08/01/2016, 7:33 PM  [facility] [address] [place], [place], [address] Phone: [phone]   Fax:  [phone]  Name: MCCOY CHAO MRN: [id number] Date of Birth: [birth year]

## 2016-08-01 NOTE — Patient Instructions (Signed)
Habituation - Tip Card  1.The goal of habituation training is to assist in decreasing symptoms of vertigo, dizziness, or nausea provoked by specific head and body motions. 2.These exercises may initially increase symptoms; however, be persistent and work through symptoms. With repetition and time, the exercises will assist in reducing or eliminating symptoms. 3.Exercises should be stopped and discussed with the therapist if you experience any of the following: - Sudden change or fluctuation in hearing - New onset of ringing in the ears, or increase in current intensity - Any fluid discharge from the ear - Severe pain in neck or back - Extreme nausea  Copyright  VHI. All rights reserved.   Habituation - Rolling   With pillow under head, start on back. Roll to your right side.  Hold until dizziness stops, plus 20 seconds and then roll to the left side.  Hold until dizziness stops, plus 20 seconds.  Repeat sequence 5 times per session. Do 2 sessions per day.  Copyright  VHI. All rights reserved.          Habituation - Sit to Side-Lying   Sit on edge of bed. Lie down onto the right side and hold until dizziness stops, plus 20 seconds.  Return to sitting and wait until dizziness stops, plus 20 seconds.  Repeat to the left side. Repeat sequence 5 times per session. Do 2 sessions per day.  Copyright  VHI. All rights reserved.     

## 2016-08-08 ENCOUNTER — Telehealth: Payer: Self-pay | Admitting: Rehabilitative and Restorative Service Providers"

## 2016-08-08 NOTE — Telephone Encounter (Signed)
PT returned patient phone call regarding worsening/return of vertigo.  She notes that dizziness is bad when she lays on her back.  PT inquired about other symptoms:  HA- she notes that she did have HA earlier this week and took ibuprofen to tx; resolved now  She denies vision changes.  PT and patient also discussed using bedside commode as dizziness is worse this week and she is having incontinence issues trying to get to bathroom.  Recommended using bedside commode.    Also, patient not scheduled this week for PT.  Able to get scheduled for tomorrow at 2pm with Raylene Everts, PT  [place], PT

## 2016-08-09 ENCOUNTER — Encounter: Payer: Self-pay | Admitting: Physical Therapy

## 2016-08-09 ENCOUNTER — Ambulatory Visit: Payer: Medicare Other | Attending: Neurology | Admitting: Physical Therapy

## 2016-08-09 DIAGNOSIS — R2689 Other abnormalities of gait and mobility: Secondary | ICD-10-CM | POA: Diagnosis not present

## 2016-08-09 DIAGNOSIS — R2681 Unsteadiness on feet: Secondary | ICD-10-CM

## 2016-08-09 DIAGNOSIS — H8111 Benign paroxysmal vertigo, right ear: Secondary | ICD-10-CM | POA: Diagnosis not present

## 2016-08-09 NOTE — Therapy (Signed)
[facility] [address] [place], [place], [address] Phone: [phone]   Fax:  [phone]  Physical Therapy Treatment  Patient Details  Name: Whitney Newton MRN: [id number] Date of Birth: [birth year] Referring Provider: Marianna Payment, MD  Encounter Date: 08/09/2016      PT End of Session - 08/09/16 1648    Visit Number 2   Number of Visits 4   Date for PT Re-Evaluation 08/31/16   Authorization Type G code every 10th visit   PT Start Time 1400   PT Stop Time 1445   PT Time Calculation (min) 45 min   Activity Tolerance Patient tolerated treatment well   Behavior During Therapy [facility] for tasks assessed/performed      Past Medical History:  Diagnosis Date  . Acute diastolic (congestive) heart failure 12/25/2015  . Back pain    prior back surgery in 2009 - slow to recover  . Cancer ([place])   . Diabetes ([place])   . Diverticulitis   . Gastritis   . Hemangioma of liver    managed conservatively; followed at [place] (hyperlipidemia)    statin intolerant  . Hypertension   . Normal cardiac stress test 2011  . Obesity   . Thyroid disease    hypothyroidism    Past Surgical History:  Procedure Laterality Date  . ABDOMINAL HYSTERECTOMY    . APPENDECTOMY    . NASAL RECONSTRUCTION      There were no vitals filed for this visit.      Subjective Assessment - 08/09/16 1413    Subjective Pt reporting continued positional vertigo at home when rolling in bed.  Returns today for re-assessment of all canals.  Not using RW today.   Patient is accompained by: Family member   Pertinent History h/o 3 strokes (2005, 2014), spinal compression at T10, h/o migraine aura without headache,  CHF, back surgery 2009, diabetes mellitus, hypothyroidism, middle cerebral artery stenosis (right), lumbar radiculopathy   Patient Stated Goals "I'd atleast like to get back to where I was" prior to onset of vertigo.   Currently in  Pain? No/denies          Vestibular Assessment - 08/09/16 1417      Vestibular Assessment   General Observation Not using RW today but continues to report vertigo and difficulty walkling     Positional Testing   Dix-Hallpike --   Sidelying Test Sidelying Right;Sidelying Left   Horizontal Canal Testing Horizontal Canal Right;Horizontal Canal Left     Sidelying Right   Sidelying Right Duration 15 seconds   Sidelying Right Symptoms Upbeat, right rotatory nystagmus     Sidelying Left   Sidelying Left Duration none   Sidelying Left Symptoms No nystagmus     Horizontal Canal Right   Horizontal Canal Right Duration 0   Horizontal Canal Right Symptoms Normal     Horizontal Canal Left   Horizontal Canal Left Duration 0   Horizontal Canal Left Symptoms Normal          Vestibular Treatment/Exercise - 08/09/16 1430      Vestibular Treatment/Exercise   Vestibular Treatment Provided Canalith Repositioning   Canalith Repositioning Epley Manuever Right      EPLEY MANUEVER RIGHT   Number of Reps  2   Overall Response Improved Symptoms          PT Education - 08/09/16 1647    Education provided Yes   Education Details BPPV and treatment recommendations  Person(s) Educated Patient   Methods Explanation   Comprehension Verbalized understanding           PT Long Term Goals - 08/01/16 1910      PT LONG TERM GOAL #1   Title The patient will be able to perform HEP for habituation with husand's assist for mgmt of motion sensitivity.   Baseline Target date 08/31/2016   Time 4   Period Weeks     PT LONG TERM GOAL #2   Title The patient will report no symptoms with positional testing.   Baseline Target date 08/31/2016   Time 4   Period Weeks     PT LONG TERM GOAL #3   Title The patient will be further assessed on balance measures and goals to follow, as indicated.   Baseline Target date 08/31/2016   Time 4   Period Weeks     PT LONG TERM GOAL #4   Title The  patient will ambulate short household distances x 30 ft without device (prior level of function before onset of vertigo) with intermittent UE support.   Baseline Target date 08/31/2016   Time 4   Period Weeks     PT LONG TERM GOAL #5   Title Reduce DHI from 76% to < or equal to 50% to demo dec'd self perception of dizziness.   Baseline Target date 08/31/2016   Time 4   Period Weeks          Plan - 08/09/16 1648    Clinical Impression Statement Treatment session focused on re-assessment of canals due to continued reports of vertigo during rolling and supine <> sit.  Use of Frenzel lenses today to deny fixation of gaze.  Pt noted to have R posterior canal canalithiasis-treated x 2 with improvement in symptoms.  Will continue to assess and will initiate balance HEP if symptoms vertigo has resolved.   Rehab Potential Good   PT Frequency 1x / week   PT Duration 4 weeks   PT Treatment/Interventions ADLs/Self Care Home Management;Canalith Repostioning;Vestibular;Neuromuscular re-education;Therapeutic activities;Therapeutic exercise;Gait training;Patient/family education;Functional mobility training   PT Next Visit Plan Recheck positional testing, provide balance/moblity HEP; tx BPPV as indicated.   Consulted and Agree with Plan of Care Patient      Patient will benefit from skilled therapeutic intervention in order to improve the following deficits and impairments:  Abnormal gait, Decreased activity tolerance, Decreased balance, Dizziness, Difficulty walking  Visit Diagnosis: BPPV (benign paroxysmal positional vertigo), right  Other abnormalities of gait and mobility  Unsteadiness on feet     Problem List Patient Active Problem List   Diagnosis Date Noted  . Acute diastolic (congestive) heart failure 12/25/2015  . Dyspnea 11/22/2015  . Adrenal adenoma 02/14/2015  . Arthritis 02/14/2015  . Diabetes ([place]) 02/14/2015  . Diabetes mellitus type 2, uncontrolled ([place]) 02/14/2015  .  Essential (primary) hypertension 02/14/2015  . HLD (hyperlipidemia) 02/14/2015  . BP (high blood pressure) 02/14/2015  . Disc disease with myelopathy, 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  . 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 Nerve root pain 02/14/2015  . Right hemisphere, 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[place]) 02/14/2015  . Cerebral vascular accident ([place]) 02/14/2015  . Benign essential HTN   . Esophageal reflux   . Hepatic hemangioma   . Acute kidney injury (nontraumatic) ([place]) 01/18/2015  . Diabetes mellitus type 2, controlled ([place]) 01/18/2015  . Pain in the chest   . Lumbar radiculopathy 12/02/2014  . RBBB 08/02/2014  . Cerebral atherosclerosis 12/08/2013  . Obstructive apnea 11/26/2013  .  Cerebral infarct ([place]) 05/12/2013  . Atherosclerosis of aorta ([place]) 04/12/2013  . 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 Chest pain 04/12/2013  . Skin rash 01/15/2013  . 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/14/2012  . Fatty tumor 09/09/2012  . H/O malignant neoplasm of skin 09/09/2012  . CVA (cerebral vascular accident) ([place]) 07/31/2012  . Acquired aphasia 05/25/2012  . Neoplasm of uncertain behavior of skin 09/24/2011  . Bilateral edema of lower extremity 09/09/2011  . Diabetes mellitus, type 2 ([place]) 08/15/2011  . Carpal tunnel syndrome 08/07/2011  . Lesion, thoracic root 08/07/2011  . Cervical nerve root disorder 07/18/2011  . 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 Hypothyroidism 09/18/2010  . Benign hypertensive heart disease without heart failure 09/18/2010  . Dyslipidemia 09/18/2010  . Type II or unspecified type diabetes mellitus without mention of complication, uncontrolled 09/18/2010  . Hemangioma of liver 09/18/2010  . Postmenopausal state 09/18/2010  . Chest pain 09/18/2010  . Dizziness 09/18/2010    Raylene Everts, PT, DPT 08/09/16    4:51 PM   [facility] [address] [place], [place],  [address] Phone: [phone]   Fax:  [phone]  Name: Whitney Newton MRN: [id number] Date of Birth: [birth year]

## 2016-08-12 ENCOUNTER — Other Ambulatory Visit: Payer: Self-pay | Admitting: Internal Medicine

## 2016-08-12 NOTE — Telephone Encounter (Signed)
Rx has been sent to the pharmacy electronically. ° °

## 2016-08-14 ENCOUNTER — Ambulatory Visit: Payer: Medicare Other | Admitting: Rehabilitative and Restorative Service Providers"

## 2016-08-14 DIAGNOSIS — R2681 Unsteadiness on feet: Secondary | ICD-10-CM | POA: Diagnosis not present

## 2016-08-14 DIAGNOSIS — H8111 Benign paroxysmal vertigo, right ear: Secondary | ICD-10-CM | POA: Diagnosis not present

## 2016-08-14 DIAGNOSIS — R2689 Other abnormalities of gait and mobility: Secondary | ICD-10-CM

## 2016-08-14 NOTE — Patient Instructions (Addendum)
Functional Quadriceps: Sit to Stand    Sit on edge of chair, feet flat on floor. Stand upright, extending knees fully.   Repeat __10__ times per set. Do __1__ sets per session. Do __2__ sessions per day.  http://orth.exer.us/735   Copyright  VHI. All rights reserved.   Mini Squat: Double Leg    With feet shoulder width apart, reach forward for balance and do a mini squat. Keep knees in line with second toe. Knees do not go past toes.  HOLD ONTO COUNTERTOP. Repeat _10__ times per set. 2 Times/day.  http://plyo.exer.us/70   Copyright  VHI. All rights reserved.   Heel Raise: Bilateral (Standing)    Rise on balls of feet.  HOLD COUNTERTOP FOR SUPPORT. Repeat _20___ times per set. Do __1__ sets per session. Do __2__ sessions per day.  http://orth.exer.us/39   Copyright  VHI. All rights reserved.   Toe Raise (Standing)    Rock back on heels. HOLD COUNTERTOP. Repeat __20__ times per set. Do __1__ sets per session. Do __2__ sessions per day.  http://orth.exer.us/43   Copyright  VHI. All rights reserved.     WALKING FOR EXERCISE: Use your rollater walker (with seat) and begin walking on level surfaces for 5 minutes at a time.  2 times/day.  Work towards increasing distance to 10 minutes at a time without resting.

## 2016-08-14 NOTE — Therapy (Signed)
[facility] [address] [place], [place], [address] Phone: [phone]   Fax:  [phone]  Physical Therapy Treatment  Patient Details  Name: Whitney Newton MRN: [id number] Date of Birth: April 28, 1941 Referring Provider: Marianna Payment, MD  Encounter Date: 08/14/2016      PT End of Session - 08/14/16 1157    Visit Number 3   Number of Visits 4   Date for PT Re-Evaluation 08/31/16   Authorization Type G code every 10th visit   PT Start Time 1152   PT Stop Time 1232   PT Time Calculation (min) 40 min   Activity Tolerance Patient tolerated treatment well   Behavior During Therapy [facility] for tasks assessed/performed      Past Medical History:  Diagnosis Date  . Acute diastolic (congestive) heart failure 12/25/2015  . Back pain    prior back surgery in 2009 - slow to recover  . Cancer ([place])   . Diabetes ([place])   . Diverticulitis   . Gastritis   . Hemangioma of liver    managed conservatively; followed at [place] (hyperlipidemia)    statin intolerant  . Hypertension   . Normal cardiac stress test 2011  . Obesity   . Thyroid disease    hypothyroidism    Past Surgical History:  Procedure Laterality Date  . ABDOMINAL HYSTERECTOMY    . APPENDECTOMY    . NASAL RECONSTRUCTION      There were no vitals filed for this visit.      Subjective Assessment - 08/14/16 1154    Subjective The patient feels vertigo improved, but still underlying.  She brought RW today.  She feels like vertigo could restart at any time.     Patient is accompained by: Family member  spouse waits in lobby   Pertinent History h/o 3 strokes (2005, 2014), spinal compression at T10, h/o migraine aura without headache,  CHF, back surgery 2009, diabetes mellitus, hypothyroidism, middle cerebral artery stenosis (right), lumbar radiculopathy   Patient Stated Goals "I'd atleast like to get back to where I was" prior to onset of vertigo.    Currently in Pain? No/denies                Vestibular Assessment - 08/14/16 1218      Vestibular Assessment   General Observation "no comparison" for vertigo as compared to visit on 08/09/16.       Positional Testing   Sidelying Test Sidelying Right;Sidelying Left     Sidelying Right   Sidelying Right Duration mild sensation with frenzels donned   Sidelying Right Symptoms No nystagmus     Sidelying Left   Sidelying Left Duration none   Sidelying Left Symptoms No nystagmus                 [facility] Adult PT Treatment/Exercise - 08/14/16 1208      Ambulation/Gait   Ambulation/Gait Yes   Ambulation/Gait Assistance 6: Modified independent (Device/Increase time)   Ambulation Distance (Feet) 300 Feet   Assistive device 4-wheeled walker;None   Ambulation Surface Level   Gait Comments Also ambulated without a device x 115 feet with supervision without UE support.  Patient notes she moves small distances in her home without walker due to vertigo feeling improved.     Exercises   Exercises Knee/Hip;Ankle     Knee/Hip Exercises: Standing   Functional Squat 10 reps   Functional Squat Limitations at countertop     Knee/Hip  Exercises: Seated   Sit to Sand 10 reps;with UE support     Ankle Exercises: Standing   Heel Raises 10 reps   Toe Raise 10 reps   Toe Raise Limitations with UE support.                PT Education - 08/14/16 1233    Education provided Yes   Education Details HEP:  mini squats, sit<>stand, heel/toe raises all with UE support.   Person(s) Educated Patient   Methods Explanation;Demonstration;Handout   Comprehension Verbalized understanding;Returned demonstration             PT Long Term Goals - 08/14/16 1427      PT LONG TERM GOAL #1   Title The patient will be able to perform HEP for habituation with husand's assist for mgmt of motion sensitivity.   Baseline Target date 08/31/2016   Time 4   Period Weeks   Status On-going      PT LONG TERM GOAL #2   Title The patient will report no symptoms with positional testing.   Baseline Target date 08/31/2016   Time 4   Period Weeks   Status On-going     PT LONG TERM GOAL #3   Title The patient will be further assessed on balance measures and goals to follow, as indicated.   Baseline Target date 08/31/2016   Time 4   Period Weeks     PT LONG TERM GOAL #4   Title The patient will ambulate short household distances x 30 ft without device (prior level of function before onset of vertigo) with intermittent UE support.   Baseline Met on 08/14/16   Time 4   Period Weeks   Status Achieved     PT LONG TERM GOAL #5   Title Reduce DHI from 76% to < or equal to 50% to demo dec'd self perception of dizziness.   Baseline Target date 08/31/2016   Time 4   Period Weeks   Status On-going               Plan - 08/14/16 1429    Clinical Impression Statement The patient met 1 LTG for walking intermittently in home without rollater RW --which is her baseline level of mobility.  PT progressed HEP to include balance activities.  Rechecked BPPV with frenzels donned without nystagmus viewed.  Continue working towards [employer] and general conditioning.    PT Treatment/Interventions ADLs/Self Care Home Management;Canalith Repostioning;Vestibular;Neuromuscular re-education;Therapeutic activities;Therapeutic exercise;Gait training;Patient/family education;Functional mobility training   PT Next Visit Plan Recheck positional with frenzel lenses, HEP for LE strength, ? add core exercises/trunk (if tolerable).   Consulted and Agree with Plan of Care Patient      Patient will benefit from skilled therapeutic intervention in order to improve the following deficits and impairments:  Abnormal gait, Decreased activity tolerance, Decreased balance, Dizziness, Difficulty walking  Visit Diagnosis: BPPV (benign paroxysmal positional vertigo), right  Other abnormalities of gait and  mobility  Unsteadiness on feet     Problem List Patient Active Problem List   Diagnosis Date Noted  . Acute diastolic (congestive) heart failure 12/25/2015  . Dyspnea 11/22/2015  . Adrenal adenoma 02/14/2015  . Arthritis 02/14/2015  . Diabetes ([place]) 02/14/2015  . Diabetes mellitus type 2, uncontrolled ([place]) 02/14/2015  . Essential (primary) hypertension 02/14/2015  . HLD (hyperlipidemia) 02/14/2015  . BP (high blood pressure) 02/14/2015  . Disc disease with myelopathy, 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  . 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 Nerve  root pain 02/14/2015  . Right hemisphere, 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[place]) 02/14/2015  . Cerebral vascular accident ([place]) 02/14/2015  . Benign essential HTN   . Esophageal reflux   . Hepatic hemangioma   . Acute kidney injury (nontraumatic) ([place]) 01/18/2015  . Diabetes mellitus type 2, controlled ([place]) 01/18/2015  . Pain in the chest   . Lumbar radiculopathy 12/02/2014  . RBBB 08/02/2014  . Cerebral atherosclerosis 12/08/2013  . Obstructive apnea 11/26/2013  . Cerebral infarct ([place]) 05/12/2013  . Atherosclerosis of aorta ([place]) 04/12/2013  . 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 Chest pain 04/12/2013  . Skin rash 01/15/2013  . 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/14/2012  . Fatty tumor 09/09/2012  . H/O malignant neoplasm of skin 09/09/2012  . CVA (cerebral vascular accident) ([place]) 07/31/2012  . Acquired aphasia 05/25/2012  . Neoplasm of uncertain behavior of skin 09/24/2011  . Bilateral edema of lower extremity 09/09/2011  . Diabetes mellitus, type 2 ([place]) 08/15/2011  . Carpal tunnel syndrome 08/07/2011  . Lesion, thoracic root 08/07/2011  . Cervical nerve root disorder 07/18/2011  . 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 Hypothyroidism 09/18/2010  . Benign hypertensive heart disease without heart failure 09/18/2010  . Dyslipidemia 09/18/2010  . Type II or unspecified type diabetes mellitus  without mention of complication, uncontrolled 09/18/2010  . Hemangioma of liver 09/18/2010  . Postmenopausal state 09/18/2010  . Chest pain 09/18/2010  . Dizziness 09/18/2010    Elai Vanwyk, PT 08/14/2016, 2:30 PM  [facility] [address] [place], [address] Phone: [phone]   Fax:  [phone]  Name: LINCOLN KLEINER MRN: [id number] Date of Birth: 1940/11/25

## 2016-08-21 ENCOUNTER — Ambulatory Visit: Payer: Medicare Other | Admitting: Rehabilitative and Restorative Service Providers"

## 2016-08-21 DIAGNOSIS — H8111 Benign paroxysmal vertigo, right ear: Secondary | ICD-10-CM

## 2016-08-21 DIAGNOSIS — R2681 Unsteadiness on feet: Secondary | ICD-10-CM

## 2016-08-21 DIAGNOSIS — R2689 Other abnormalities of gait and mobility: Secondary | ICD-10-CM | POA: Diagnosis not present

## 2016-08-21 NOTE — Therapy (Signed)
[facility] [address] [place], [place], [address] Phone: [phone]   Fax:  [phone]  Physical Therapy Treatment  Patient Details  Name: Whitney Newton MRN: [id number] Date of Birth: [birth year] Referring Provider: Marianna Payment, MD  Encounter Date: 08/21/2016      PT End of Session - 08/21/16 1032    Visit Number 4   Number of Visits 4   Date for PT Re-Evaluation 08/31/16   Authorization Type G code every 10th visit   PT Start Time 1027   PT Stop Time 1107   PT Time Calculation (min) 40 min   Activity Tolerance Patient tolerated treatment well   Behavior During Therapy [facility] for tasks assessed/performed      Past Medical History:  Diagnosis Date  . Acute diastolic (congestive) heart failure 12/25/2015  . Back pain    prior back surgery in 2009 - slow to recover  . Cancer ([place])   . Diabetes ([place])   . Diverticulitis   . Gastritis   . Hemangioma of liver    managed conservatively; followed at [facility] (hyperlipidemia)    statin intolerant  . Hypertension   . Normal cardiac stress test 2011  . Obesity   . Thyroid disease    hypothyroidism    Past Surgical History:  Procedure Laterality Date  . ABDOMINAL HYSTERECTOMY    . APPENDECTOMY    . NASAL RECONSTRUCTION      There were no vitals filed for this visit.      Subjective Assessment - 08/21/16 1029    Subjective The patient felt that vertigo was imrpoved, but then returned 2 nights ago.  She reports that was able to leave her walker in the car on Sunday for church.  She brought her old walker for PT to adjust.   She reports that she is going without the RW more in the home.   Pertinent History h/o 3 strokes (2005, 2014), spinal compression at T10, h/o migraine aura without headache,  CHF, back surgery 2009, diabetes mellitus, hypothyroidism, middle cerebral artery stenosis (right), lumbar radiculopathy   Patient Stated Goals "I'd  atleast like to get back to where I was" prior to onset of vertigo.   Currently in Pain? No/denies                Vestibular Assessment - 08/21/16 1033      Vestibular Assessment   General Observation Walks into clinic with rollater RW.   She notes diizziness was a sensation like she would be a little off balance when she got up.       Positional Testing   Sidelying Test Sidelying Right;Sidelying Left   Horizontal Canal Testing Horizontal Canal Right;Horizontal Canal Left     Sidelying Right   Sidelying Right Duration nausea, mild sensation, no nystagmus viewed with frenzels on   Sidelying Right Symptoms No nystagmus     Sidelying Left   Sidelying Left Duration none   Sidelying Left Symptoms No nystagmus     Horizontal Canal Right   Horizontal Canal Right Duration none   Horizontal Canal Right Symptoms Normal     Horizontal Canal Left   Horizontal Canal Left Duration mild sense of spining in her head; no room spinning   Horizontal Canal Left Symptoms Normal  used frenzel lenses                 [facility] Adult PT Treatment/Exercise - 08/21/16 1300  Ambulation/Gait   Ambulation/Gait Yes   Ambulation/Gait Assistance 6: Modified independent (Device/Increase time)   Ambulation Distance (Feet) 300 Feet   Assistive device 4-wheeled walker   Ambulation Surface Level;Indoor;Outdoor   Gait Comments Ambulated to car with rollater RW.      Self-Care   Self-Care Other Self-Care Comments   Other Self-Care Comments  Patient demonstrated how to needs to lift LEs using hands to move legs into the car.  PT to add one more visit to work on strengthening for car transfers.      Neuro Re-ed    Neuro Re-ed Details  Wall bumps for hip strategy and core strengthening x 10 reps.      Exercises   Exercises Other Exercises   Other Exercises  Standing marching near countertop with bilateral UE support x 10 marches with fatigue worse on R side (lower lifting).            Vestibular Treatment/Exercise - 08/21/16 1045      Vestibular Treatment/Exercise   Vestibular Treatment Provided Habituation   Habituation Exercises Legrand Como Daroff   Number of Reps  2   Symptom Description  mild sensation of spin in frontal area of her head; mild nausea; no nystagmus viewed                    PT Long Term Goals - 08/14/16 1427      PT LONG TERM GOAL #1   Title The patient will be able to perform HEP for habituation with husand's assist for mgmt of motion sensitivity.   Baseline Target date 08/31/2016   Time 4   Period Weeks   Status On-going     PT LONG TERM GOAL #2   Title The patient will report no symptoms with positional testing.   Baseline Target date 08/31/2016   Time 4   Period Weeks   Status On-going     PT LONG TERM GOAL #3   Title The patient will be further assessed on balance measures and goals to follow, as indicated.   Baseline Target date 08/31/2016   Time 4   Period Weeks     PT LONG TERM GOAL #4   Title The patient will ambulate short household distances x 30 ft without device (prior level of function before onset of vertigo) with intermittent UE support.   Baseline Met on 08/14/16   Time 4   Period Weeks   Status Achieved     PT LONG TERM GOAL #5   Title Reduce DHI from 76% to < or equal to 50% to demo dec'd self perception of dizziness.   Baseline Target date 08/31/2016   Time 4   Period Weeks   Status On-going               Plan - 08/21/16 1259    Clinical Impression Statement The patient is continuing with  mild motion sensitivity, however she is without nystagmus with positional testing.  PT began to incorporate some activities including wall bumps and marching to improve core strengthening.  Plan to add one further visit to provide further HEP and work on car transfer techniques (and strengthening exercises for car transfers).    PT Treatment/Interventions ADLs/Self Care Home  Management;Canalith Repostioning;Vestibular;Neuromuscular re-education;Therapeutic activities;Therapeutic exercise;Gait training;Patient/family education;Functional mobility training   PT Next Visit Plan Add marching + wall bumps to HEP, car transfers (work on hip flexor strengthening to improve in clinic)   Consulted and  Agree with Plan of Care Patient      Patient will benefit from skilled therapeutic intervention in order to improve the following deficits and impairments:  Abnormal gait, Decreased activity tolerance, Decreased balance, Dizziness, Difficulty walking  Visit Diagnosis: BPPV (benign paroxysmal positional vertigo), right  Other abnormalities of gait and mobility  Unsteadiness on feet     Problem List Patient Active Problem List   Diagnosis Date Noted  . Acute diastolic (congestive) heart failure 12/25/2015  . Dyspnea 11/22/2015  . Adrenal adenoma 02/14/2015  . Arthritis 02/14/2015  . Diabetes ([place]) 02/14/2015  . Diabetes mellitus type 2, uncontrolled ([place]) 02/14/2015  . Essential (primary) hypertension 02/14/2015  . HLD (hyperlipidemia) 02/14/2015  . BP (high blood pressure) 02/14/2015  . Disc disease with myelopathy, 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  . 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 Nerve root pain 02/14/2015  . Right hemisphere, 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[place]) 02/14/2015  . Cerebral vascular accident ([place]) 02/14/2015  . Benign essential HTN   . Esophageal reflux   . Hepatic hemangioma   . Acute kidney injury (nontraumatic) ([place]) 01/18/2015  . Diabetes mellitus type 2, controlled ([place]) 01/18/2015  . Pain in the chest   . Lumbar radiculopathy 12/02/2014  . RBBB 08/02/2014  . Cerebral atherosclerosis 12/08/2013  . Obstructive apnea 11/26/2013  . Cerebral infarct ([place]) 05/12/2013  . Atherosclerosis of aorta ([place]) 04/12/2013  . 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 Chest pain 04/12/2013  . Skin rash 01/15/2013  .  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/14/2012  . Fatty tumor 09/09/2012  . H/O malignant neoplasm of skin 09/09/2012  . CVA (cerebral vascular accident) ([place]) 07/31/2012  . Acquired aphasia 05/25/2012  . Neoplasm of uncertain behavior of skin 09/24/2011  . Bilateral edema of lower extremity 09/09/2011  . Diabetes mellitus, type 2 ([place]) 08/15/2011  . Carpal tunnel syndrome 08/07/2011  . Lesion, thoracic root 08/07/2011  . Cervical nerve root disorder 07/18/2011  . 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 Hypothyroidism 09/18/2010  . Benign hypertensive heart disease without heart failure 09/18/2010  . Dyslipidemia 09/18/2010  . Type II or unspecified type diabetes mellitus without mention of complication, uncontrolled 09/18/2010  . Hemangioma of liver 09/18/2010  . Postmenopausal state 09/18/2010  . Chest pain 09/18/2010  . Dizziness 09/18/2010    Majid Mccravy, PT 08/21/2016, 1:02 PM  [facility] [address] [place], [place], [address] Phone: [phone]   Fax:  [phone]  Name: Whitney Newton MRN: [id number] Date of Birth: 10-21-1940

## 2016-08-29 ENCOUNTER — Ambulatory Visit (INDEPENDENT_AMBULATORY_CARE_PROVIDER_SITE_OTHER): Payer: Medicare Other | Admitting: Internal Medicine

## 2016-08-29 ENCOUNTER — Encounter: Payer: Self-pay | Admitting: Internal Medicine

## 2016-08-29 VITALS — BP 142/70 | HR 71 | Ht 63.5 in | Wt 198.0 lb

## 2016-08-29 DIAGNOSIS — Z79899 Other long term (current) drug therapy: Secondary | ICD-10-CM

## 2016-08-29 DIAGNOSIS — R0602 Shortness of breath: Secondary | ICD-10-CM

## 2016-08-29 DIAGNOSIS — I5031 Acute diastolic (congestive) heart failure: Secondary | ICD-10-CM | POA: Diagnosis not present

## 2016-08-29 DIAGNOSIS — I10 Essential (primary) hypertension: Secondary | ICD-10-CM

## 2016-08-29 LAB — BASIC METABOLIC PANEL
BUN: 19 mg/dL (ref 7–25)
CHLORIDE: 103 mmol/L (ref 98–110)
CO2: 25 mmol/L (ref 20–31)
Calcium: 9.2 mg/dL (ref 8.6–10.4)
Creat: 0.77 mg/dL (ref 0.60–0.93)
Glucose, Bld: 274 mg/dL — ABNORMAL HIGH (ref 65–99)
POTASSIUM: 3.9 mmol/L (ref 3.5–5.3)
SODIUM: 140 mmol/L (ref 135–146)

## 2016-08-29 MED ORDER — POTASSIUM CHLORIDE CRYS ER 20 MEQ PO TBCR: 40.0000 meq | EXTENDED_RELEASE_TABLET | Freq: Every day | ORAL | 1 refills | Status: DC

## 2016-08-29 MED ORDER — FUROSEMIDE 40 MG PO TABS: 40.0000 mg | ORAL_TABLET | Freq: Every day | ORAL | 1 refills | Status: DC

## 2016-08-29 NOTE — Patient Instructions (Addendum)
Your physician recommends that you return for lab work TODAY - BMET, BNP  Your physician has recommended you make the following change in your medication: -- INCREASE lasix to 40mg  daily & INCREASE potassium to 38mEq daily  Your physician wants you to follow-up in: 1 months with Dr. Debara Pickett.

## 2016-08-29 NOTE — Progress Notes (Signed)
Cardiology Office Note   Date:  08/29/2016   ID:  Whitney Newton 1940/12/21, MRN [id number]  PCP:  Donnie Coffin, MD   CC: Worsening shortness of breath and swelling for several days  History of Present Illness: Whitney Newton is a 76 y.o. female who presents for  Four-month follow-up visit  This pleasant 76 year old woman is seen for a scheduled followup visit. The patient has a history of having had a stroke in early December 2014. She was at her diabetes clinic at [facility] and they diagnosed her and sent her straight to neurology where she was hospitalized for 3 days. The MRI showed a new stroke on the right side. She had carotid Dopplers which did not show any obstructive lesions but she did have some plaque and they put her back on aspirin and continued her Plavix. The patient had a echocardiogram which was a bubble study and did not show any evidence of right-to-left shunt. More recently, she has had further neurology workup at [facility] on 06/20/14. She had a transcranial Doppler. 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 Previous carotid Dopplers had not shown any significant external cranial disease She has a history of essential hypertension, diabetes mellitus, and dyslipidemia. She has had atypical chest pain. She is intolerant of statin drugs. We updated her lexiscan Myoview stress test on 03/17/12 and it was normal showing no evidence of ischemia and her ejection fraction was 78%. The patient has never had a cardiac catheterization. She has continued to have some shortness of breath. She had an echocardiogram in 04/07/12 which showed an ejection fraction of 55-65% with grade 1 diastolic dysfunction. There is trivial aortic insufficiency.  The patient has a history of essential hypertension as well as atypical chest pain.  Her diabetes is now being followed at the [facility] clinic at [facility]. Since we last saw her she had an ophthalmology evaluation on 07/27/14 by Dr. Kathrin Penner and no diabetic retinopathy was found. Her last chest x-ray was on 12/21/12 elsewhere and showed a normal heart size and low lung volumes. She had a recent CT scan of the chest on 03/23/13 at [facility] which showed normal heart size and demonstrated atherosclerosis of the aorta.  The patient has a past history of dermatofibrosarcoma of the spine. She had surgery for this at [facility]. She also has a past history of angiolipoma of the abdominal wall.  Since last visit she has not been experiencing any new cardiac symptoms. She does have sleep apnea. She uses a CPAP machine. She has had some low back pain and pain in her hip radiating to her knees. She has seen Dr. Durward Fortes. She was admitted to the hospital on 01/18/15 for chest and abdominal pain. She had a CTA of the chest on 01/18/15 which was negative for pulmonary embolus. The following day she had a Myoview on 01/19/15 which showed no ischemia and her ejection fraction was 85%. She had a lot of side effects from the [facility]. It was felt in retrospect that her presenting complaints were probably GI in origin rather than cardiac. She does have a history of GERD and sees Dr. Oletta Lamas.  since last visit she has had no new cardiac symptoms.  She has not been having any recent chest discomfort. Her weight is unchanged and her blood pressures been stable.  11/22/2015  Whitney Newton presents today to establish care with me. She is a previous patient of Dr. Warren Danes. [place] the beach and developed some worsening shortness of breath and leg swelling. Her nephew was there who came down from [place] with a head cold and she seems to have symptoms now at this time. She was also seen at an urgent care there and placed on amoxicillin. She's taken that for  a few days. She notes significant swelling in her weight is much higher than it had been in the past. She endorses a high salt intake. Her last echocardiogram was in 2013 which showed an EF of 55-65% with grade 1 diastolic dysfunction. She had a stress test in 2016 for chest pain which showed an EF of 85%.  12/25/2015  I saw Whitney Newton that today in the office. She has been taking the Lasix 40 mg daily which was over a week and this resulted in significant diuresis. Her weight had reduced from 202 down to 189. She then had to stop it for a few days because of some other medical problems are currently weight is up to 194. Her BNP associate with this was very low at 8 and renal function appeared to be stable. She reports a 76% improvement in breathing. Her repeat echo shows that EF remains stable with grade 1 diastolic dysfunction.  03/13/2016  Whitney Newton reports she's had a marked improvement in her swelling and breathing with additional diuretics. Weight has been fairly stable. She reports one episode of chest pain which was responsive to nitroglycerin. She's also had some reflux symptoms and is currently on Zegerid which appears to be helping her symptoms. Is not clear whether these episodes of chest discomfort are related to reflux or coronary ischemia. She did have a negative Myoview stress test in August 2016.  08/29/2016  Whitney Newton was seen today in the office. Over the past several day she's had worsening shortness of breath and lower extremity swelling. Her weight is now up about 6 pounds from her previous office visit. She said over the auscultation is been taking Lasix more regularly. She's currently taking 40 mg daily but as previously taken that for 3 days and alternating with 20 mg. She notes some improvement today after a couple days of diuretics.  Past Medical History:  Diagnosis Date  . Acute diastolic (congestive) heart failure 12/25/2015  . Back pain    prior back surgery in 2009 -  slow to recover  . Cancer ([place])   . Diabetes ([place])   . Diverticulitis   . Gastritis   . Hemangioma of liver    managed conservatively; followed at [place] (hyperlipidemia)    statin intolerant  . Hypertension   . Normal cardiac stress test 2011  . Obesity   . Thyroid disease    hypothyroidism    Past Surgical History:  Procedure Laterality Date  . ABDOMINAL HYSTERECTOMY    . APPENDECTOMY    . NASAL RECONSTRUCTION       Current Outpatient Prescriptions  Medication Sig Dispense Refill  . aspirin 81 MG Newton Take 81 mg by mouth daily.    . bisoprolol-hydrochlorothiazide (ZIAC) 10-6.25 MG Newton TAKE (1) Newton DAILY AS DIRECTED. 90 Newton 0  . clopidogrel (PLAVIX) 75 MG Newton TAKE 1 Newton DAILY. 90 Newton 0  . colesevelam (WELCHOL) 625 MG Newton Take 3 tablets (1,875 mg total) by mouth 2 (two) times daily as needed (cholesterol). [place]  Newton 6  . diazepam (VALIUM) 2 MG Newton Use as needed    . furosemide (LASIX) 40 MG Newton Take 1 Newton (40 mg total) by mouth daily. 90 Newton 1  . gabapentin (NEURONTIN) 100 MG capsule Take 200 mg by mouth daily as needed (nerve pain).     Marland Kitchen glucose blood ([employer] BLOOD GLUCOSE TEST STRIPS) test strip Used to check Blood Sugar 4 times daily. Dx. Code 250.00    . HUMALOG KWIKPEN 100 UNIT/ML KiwkPen Use 6 at breakfast, 8 units at lunch and 12 at supper    . hydrochlorothiazide (HYDRODIURIL) 25 MG Newton Take 6.25 mg by mouth daily.    . hyoscyamine (LEVBID) 0.375 MG 12 hr Newton Take 0.375 mg by mouth every 12 (twelve) hours as needed (cramps).     . insulin glargine (LANTUS) 100 UNIT/ML injection Inject 32 Units into the skin every morning.     . Lancets (ACCU-CHEK MULTICLIX) lancets Used to check Blood Sugar 4 times daily. Dx. Code 250.00.    Marland Kitchen levothyroxine (SYNTHROID, LEVOTHROID) 125 MCG Newton Take 125 mcg by mouth daily.    Marland Kitchen losartan (COZAAR) 25 MG Newton TAKE 1 Newton ONCE DAILY. 90 Newton 2  . nitroGLYCERIN (NITROSTAT) 0.4 MG SL  Newton DISSOLVE ONE Newton UNDER TONGUE AS NEEDED FOR ARM/CHEST PAIN. 25 Newton PRN  . pantoprazole (PROTONIX) 40 MG Newton Take 1 Newton by mouth daily.    . potassium chloride SA (K-DUR,KLOR-CON) 20 MEQ Newton Take 2 tablets (40 mEq total) by mouth daily. 180 Newton 1   No current facility-administered medications for this visit.     Allergies:   Celecoxib; Ropinirole hcl; Actos [pioglitazone hydrochloride]; Crestor [rosuvastatin calcium]; Doxycycline; Fenofibrate; Imdur [isosorbide nitrate]; Lipitor [atorvastatin calcium]; Metformin and related; Niaspan [niacin er]; Pioglitazone; Pravachol; Ranolazine er; Rosuvastatin; and Zetia [ezetimibe]    Social History:  The patient  reports that she has never smoked. She has never used smokeless tobacco. She reports that she does not drink alcohol or use drugs.   Family History:  The patient's family history includes Dementia in her father; Heart attack in her brother and father; Heart disease in her brother and father; Hypertension in her mother; Stroke in her mother.    ROS:  Please see the history of present illness.   Otherwise, review of systems are positive for none.   All other systems are reviewed and negative.    PHYSICAL EXAM: VS:  BP (!) 142/70   Pulse 71   Ht 5' 3.5" (1.613 m)   Wt 198 lb (89.8 kg)   BMI 34.52 kg/m  , BMI Body mass index is 34.52 kg/m. General appearance: alert, no distress and moderately obese Neck: JVD - 3 cm above sternal notch and no carotid bruit Lungs: diminished breath sounds bilaterally Heart: regular rate and rhythm Abdomen: soft, non-tender; bowel sounds normal; no masses,  no organomegaly Extremities: edema 2+ edema Pulses: 2+ and symmetric Skin: Skin color, texture, turgor normal. No rashes or lesions Neurologic: Grossly normal Psych: Mildly anxious  EKG:   Normal sinus rhythm, incomplete right bundle branch block  Recent Labs: 11/15/2015: ALT 14 11/29/2015: Brain Natriuretic Peptide 8.4; BUN  19; Creat 0.85; Potassium 4.1; Sodium 138    Lipid Panel    Component Value Date/Time   CHOL 209 (H) 11/15/2015 0954   TRIG 102 11/15/2015 0954   HDL 55 11/15/2015 0954   CHOLHDL 3.8 11/15/2015 0954   VLDL 20 11/15/2015 0954   LDLCALC 134 (H) 11/15/2015 3474  LDLDIRECT 148.7 09/14/2012 0857      Wt Readings from Last 3 Encounters:  08/29/16 198 lb (89.8 kg)  03/13/16 192 lb (87.1 kg)  12/25/15 194 lb 6.4 oz (88.2 kg)     ASSESSMENT AND PLAN:  1. Acute diastolic congestive heart failure 2. Hypertensive heart disease without heart failure 3. Diabetes mellitus. Recent A1c at the [facility] clinic was 8.0 4. Atypical chest pain with normal Myoview stress test in August 2016. No ischemia. Ejection fraction 85%. 28. old CVA followed at [facility] 6. History of dermatofibro- sarcoma of the spine. 7. Dyslipidemia 8. Osteoarthritis 9. OSA on CPAP [facility]) 10. Hypothyroidism followed at [facility]. 11. GERD, followed by Dr. Oletta Lamas   PLAN:  1. Whitney Newton appears to be in acute diastolic congestive heart failure. She's had about 6 pound weight gain. I like to have her continue on Lasix 40 mg by mouth daily and will recheck a metabolic profile and BNP on Monday. I may further increase her Lasix to 40 mg twice a day until we can adequately diurese her. She is advised to increase her potassium to 40 mEq daily.  Follow-up in 1 month.  Pixie Casino, MD, [facility] Attending Cardiologist [facility]   08/29/2016 1:02 PM

## 2016-08-30 ENCOUNTER — Ambulatory Visit: Payer: Medicare Other | Admitting: Rehabilitative and Restorative Service Providers"

## 2016-08-30 DIAGNOSIS — H8111 Benign paroxysmal vertigo, right ear: Secondary | ICD-10-CM | POA: Diagnosis not present

## 2016-08-30 DIAGNOSIS — R2689 Other abnormalities of gait and mobility: Secondary | ICD-10-CM | POA: Diagnosis not present

## 2016-08-30 DIAGNOSIS — R2681 Unsteadiness on feet: Secondary | ICD-10-CM | POA: Diagnosis not present

## 2016-08-30 LAB — BRAIN NATRIURETIC PEPTIDE: Brain Natriuretic Peptide: 45.7 pg/mL (ref <100)

## 2016-08-30 NOTE — Therapy (Signed)
[facility] [address] [place], [place], [address] Phone: [phone]   Fax:  [phone]  Physical Therapy Treatment and Discharge Summary  Patient Details  Name: Whitney Newton MRN: [id number] Date of Birth: [birth year] Referring Provider: Marianna Payment, MD  Encounter Date: 08/30/2016      PT End of Session - 08/30/16 1502    Visit Number 5   Date for PT Re-Evaluation 08/31/16   Authorization Type G code every 10th visit   PT Start Time 1456   PT Stop Time 1535   PT Time Calculation (min) 39 min   Activity Tolerance Patient tolerated treatment well   Behavior During Therapy [facility] for tasks assessed/performed      Past Medical History:  Diagnosis Date  . Acute diastolic (congestive) heart failure 12/25/2015  . Back pain    prior back surgery in 2009 - slow to recover  . Cancer ([place])   . Diabetes ([place])   . Diverticulitis   . Gastritis   . Hemangioma of liver    managed conservatively; followed at [facility] (hyperlipidemia)    statin intolerant  . Hypertension   . Normal cardiac stress test 2011  . Obesity   . Thyroid disease    hypothyroidism    Past Surgical History:  Procedure Laterality Date  . ABDOMINAL HYSTERECTOMY    . APPENDECTOMY    . NASAL RECONSTRUCTION      There were no vitals filed for this visit.      Subjective Assessment - 08/30/16 1459    Subjective The patient is doing HEP.  She notes she is improvising with exercises.  She notes vertigo is returning as far as "dizziness inside my head".  She does not feel room is moving.  She notes "strange" things happening with vision-- describing a sense that her cup was chipped, peripheral vision has some movement with "black blobs".      Pertinent History h/o 3 strokes (2005, 2014), spinal compression at T10, h/o migraine aura without headache,  CHF, back surgery 2009, diabetes mellitus, hypothyroidism, middle cerebral artery  stenosis (right), lumbar radiculopathy   Patient Stated Goals "I'd atleast like to get back to where I was" prior to onset of vertigo.   Currently in Pain? No/denies                Vestibular Assessment - 08/30/16 2217      Positional Testing   Sidelying Test Sidelying Right;Sidelying Left     Sidelying Right   Sidelying Right Duration mild sensation of "movement" in head; "no spinning like before", no nystagmus viewed in room light   Sidelying Right Symptoms No nystagmus     Sidelying Left   Sidelying Left Duration none   Sidelying Left Symptoms No nystagmus                 [facility] Adult PT Treatment/Exercise - 08/30/16 1505      Ambulation/Gait   Ambulation/Gait Yes   Ambulation/Gait Assistance 6: Modified independent (Device/Increase time)  slowed speed   Ambulation Distance (Feet) 200 Feet   Assistive device None   Ambulation Surface Level   Gait Comments Patient notes she is not using the walker for indoor surfaces and did not bring to PT today.      Self-Care   Self-Care Other Self-Care Comments   Other Self-Care Comments  Discussed community wellness program and use of silver sneakers membership.      Neuro Re-ed  Neuro Re-ed Details  Wall bumps for hip strategy x 10 reps.     Exercises   Exercises Other Exercises   Other Exercises  Seated marching x 10 reps R and L sides; seated wide to narrow marching.           Vestibular Treatment/Exercise - 08/30/16 2217      Vestibular Treatment/Exercise   Vestibular Treatment Provided Habituation   Habituation Exercises Legrand Como Daroff   Symptom Description  Recommended patient continue performing brandt daroff until sensation of mild symptoms clear.  Also recommended seeing eye doctor due to visual illusions.               PT Education - 08/30/16 1529    Education provided Yes   Education Details seated marching, wall bumps.  Silver sneakers*   Person(s) Educated Patient    Methods Explanation;Demonstration;Handout   Comprehension Verbalized understanding;Returned demonstration             PT Long Term Goals - 08/30/16 2213      PT LONG TERM GOAL #1   Title The patient will be able to perform HEP for habituation with husand's assist for mgmt of motion sensitivity.   Baseline [place] 08/30/2016   Time 4   Period Weeks   Status Achieved     PT LONG TERM GOAL #2   Title The patient will report no symptoms with positional testing.   Baseline No further spinning episodes, but notes intermittent sensation of movement in her head.   Time 4   Period Weeks   Status Partially Met     PT LONG TERM GOAL #3   Title The patient will be further assessed on balance measures and goals to follow, as indicated.   Baseline PT addressed balance deficits with HEP.    Time 4   Period Weeks   Status Achieved     PT LONG TERM GOAL #4   Title The patient will ambulate short household distances x 30 ft without device (prior level of function before onset of vertigo) with intermittent UE support.   Baseline Met on 08/14/16   Time 4   Period Weeks   Status Achieved     PT LONG TERM GOAL #5   Title Reduce DHI from 76% to < or equal to 50% to demo dec'd self perception of dizziness.   Baseline Did not capture at last session.   Time 4   Period Weeks   Status Not Met               Plan - 08/30/16 2214    Clinical Impression Statement The patient partially met LTGs.  PT has provided comprehensive HEP for strength, balance and habituation.  Patient notes she feels she has returned to prior level of mobility.   PT Treatment/Interventions ADLs/Self Care Home Management;Canalith Repostioning;Vestibular;Neuromuscular re-education;Therapeutic activities;Therapeutic exercise;Gait training;Patient/family education;Functional mobility training   PT Next Visit Plan Discharge today.   Consulted and Agree with Plan of Care Patient      Patient will benefit from skilled  therapeutic intervention in order to improve the following deficits and impairments:  Abnormal gait, Decreased activity tolerance, Decreased balance, Dizziness, Difficulty walking  Visit Diagnosis: BPPV (benign paroxysmal positional vertigo), right  Other abnormalities of gait and mobility  Unsteadiness on feet     Problem List Patient Active Problem List   Diagnosis Date Noted  . Acute diastolic congestive heart failure ([place]) 12/25/2015  . Dyspnea 11/22/2015  .  Adrenal adenoma 02/14/2015  . Arthritis 02/14/2015  . Diabetes ([place]) 02/14/2015  . Diabetes mellitus type 2, uncontrolled ([place]) 02/14/2015  . Essential (primary) hypertension 02/14/2015  . HLD (hyperlipidemia) 02/14/2015  . BP (high blood pressure) 02/14/2015  . Disc disease with myelopathy, 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  . 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 Nerve root pain 02/14/2015  . Right hemisphere, 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[place]) 02/14/2015  . Cerebral vascular accident ([place]) 02/14/2015  . Benign essential HTN   . Esophageal reflux   . Hepatic hemangioma   . Acute kidney injury (nontraumatic) ([place]) 01/18/2015  . Diabetes mellitus type 2, controlled ([place]) 01/18/2015  . Pain in the chest   . Lumbar radiculopathy 12/02/2014  . RBBB 08/02/2014  . Cerebral atherosclerosis 12/08/2013  . Obstructive apnea 11/26/2013  . Cerebral infarct ([place]) 05/12/2013  . Atherosclerosis of aorta ([place]) 04/12/2013  . 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 Chest pain 04/12/2013  . Skin rash 01/15/2013  . 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/14/2012  . Fatty tumor 09/09/2012  . H/O malignant neoplasm of skin 09/09/2012  . CVA (cerebral vascular accident) ([place]) 07/31/2012  . Acquired aphasia 05/25/2012  . Neoplasm of uncertain behavior of skin 09/24/2011  . Bilateral edema of lower extremity 09/09/2011  . Diabetes mellitus, type 2 ([place]) 08/15/2011  . Carpal tunnel syndrome 08/07/2011  . Lesion,  thoracic root 08/07/2011  . Cervical nerve root disorder 07/18/2011  . 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 Hypothyroidism 09/18/2010  . Benign hypertensive heart disease without heart failure 09/18/2010  . Dyslipidemia 09/18/2010  . Type II or unspecified type diabetes mellitus without mention of complication, uncontrolled 09/18/2010  . Hemangioma of liver 09/18/2010  . Postmenopausal state 09/18/2010  . Chest pain 09/18/2010  . Dizziness 09/18/2010    Kialee Kham, PT 08/30/2016, 10:19 PM  [facility] [address] [place], [place], [address] Phone: [phone]   Fax:  [phone]  Name: Whitney Newton MRN: [id number] Date of Birth: 1941/02/05

## 2016-08-30 NOTE — Patient Instructions (Signed)
Hip (Front)    Begin sitting tall, both feet flat on floor. Lift one leg and then the other, keeping upper body straight and still. Slowly return to starting position. Repeat _10___ times each leg. Do __2__ sets per session. Do _2___ sessions per day.  OPTION:   Place small item on floor and work to step over it (small book, wooden spoon).  Do 10 times right leg, then 10 times left leg.  Copyright  VHI. All rights reserved.   Weight Shift: Anterior / Posterior (Righting / Equilibrium)    Lean on wall behind you *KEEP FEET ON THE FLOOR*.  Bring hips away from the wall and get your balance.  Bump your bottom against the wall and return to standing 10 times.  Can have a chair in front of you for balance but try not to hold onto it unless you need it.    Copyright  VHI. All rights reserved.

## 2016-09-07 ENCOUNTER — Other Ambulatory Visit: Payer: Self-pay | Admitting: Internal Medicine

## 2016-10-07 DIAGNOSIS — Z961 Presence of intraocular lens: Secondary | ICD-10-CM | POA: Diagnosis not present

## 2016-10-07 DIAGNOSIS — H43813 Vitreous degeneration, bilateral: Secondary | ICD-10-CM | POA: Diagnosis not present

## 2016-10-07 DIAGNOSIS — E113213 Type 2 diabetes mellitus with mild nonproliferative diabetic retinopathy with macular edema, bilateral: Secondary | ICD-10-CM | POA: Diagnosis not present

## 2016-10-08 ENCOUNTER — Ambulatory Visit (INDEPENDENT_AMBULATORY_CARE_PROVIDER_SITE_OTHER): Payer: Medicare Other | Admitting: Internal Medicine

## 2016-10-08 ENCOUNTER — Encounter: Payer: Self-pay | Admitting: Internal Medicine

## 2016-10-08 VITALS — BP 147/71 | HR 69 | Ht 63.0 in | Wt 196.8 lb

## 2016-10-08 DIAGNOSIS — R600 Localized edema: Secondary | ICD-10-CM

## 2016-10-08 DIAGNOSIS — I10 Essential (primary) hypertension: Secondary | ICD-10-CM

## 2016-10-08 DIAGNOSIS — I5031 Acute diastolic (congestive) heart failure: Secondary | ICD-10-CM

## 2016-10-08 NOTE — Progress Notes (Signed)
Cardiology Office Note   Date:  10/08/2016   ID:  Whitney Newton, DOB [birth year], MRN [id number]  PCP:  Donnie Coffin, MD   CC: Worsening shortness of breath and swelling for several days  History of Present Illness: Whitney Newton is a 76 y.o. female who presents for  Four-month follow-up visit  This pleasant 76 year old woman is seen for a scheduled followup visit. The patient has a history of having had a stroke in early December 2014. She was at her diabetes clinic at [facility] and they diagnosed her and sent her straight to neurology where she was hospitalized for 3 days. The MRI showed a new stroke on the right side. She had carotid Dopplers which did not show any obstructive lesions but she did have some plaque and they put her back on aspirin and continued her Plavix. The patient had a echocardiogram which was a bubble study and did not show any evidence of right-to-left shunt. More recently, she has had further neurology workup at [facility] on 06/20/14. She had a transcranial Doppler. 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 Previous carotid Dopplers had not shown any significant external cranial disease She has a history of essential hypertension, diabetes mellitus, and dyslipidemia. She has had atypical chest pain. She is intolerant of statin drugs. We updated her lexiscan Myoview stress test on 03/17/12 and it was normal showing no evidence of ischemia and her ejection fraction was 78%. The patient has never had a cardiac catheterization. She has continued to have some shortness of breath. She had an echocardiogram in 04/07/12 which showed an ejection fraction of 55-65% with grade 1 diastolic dysfunction. There is trivial aortic insufficiency.  The patient has a history of essential hypertension as well as atypical chest pain. Her  diabetes is now being followed at the [facility] clinic at [facility]. Since we last saw her she had an ophthalmology evaluation on 07/27/14 by Dr. Kathrin Penner and no diabetic retinopathy was found. Her last chest x-ray was on 12/21/12 elsewhere and showed a normal heart size and low lung volumes. She had a recent CT scan of the chest on 03/23/13 at [facility] which showed normal heart size and demonstrated atherosclerosis of the aorta.  The patient has a past history of dermatofibrosarcoma of the spine. She had surgery for this at [facility]. She also has a past history of angiolipoma of the abdominal wall.  Since last visit she has not been experiencing any new cardiac symptoms. She does have sleep apnea. She uses a CPAP machine. She has had some low back pain and pain in her hip radiating to her knees. She has seen Dr. Durward Fortes. She was admitted to the hospital on 01/18/15 for chest and abdominal pain. She had a CTA of the chest on 01/18/15 which was negative for pulmonary embolus. The following day she had a Myoview on 01/19/15 which showed no ischemia and her ejection fraction was 85%. She had a lot of side effects from the [facility]. It was felt in retrospect that her presenting complaints were probably GI in origin rather than cardiac. She does have a history of GERD and sees Dr. Oletta Lamas.  since last visit she has had no new cardiac symptoms.  She has not been having any recent chest discomfort. Her weight is unchanged and her blood pressures been stable.  11/22/2015  Whitney Newton presents today to establish care with me. She is a previous patient of Dr. Warren Danes. [place] the beach and developed some worsening shortness of breath and leg swelling. Her nephew was there who came down from [place] with a head cold and she seems to have symptoms now at this time. She was also seen at an urgent care there and placed on amoxicillin. She's taken that for a  few days. She notes significant swelling in her weight is much higher than it had been in the past. She endorses a high salt intake. Her last echocardiogram was in 2013 which showed an EF of 55-65% with grade 1 diastolic dysfunction. She had a stress test in 2016 for chest pain which showed an EF of 85%.  12/25/2015  I saw Whitney Newton that today in the office. She has been taking the Lasix 40 mg daily which was over a week and this resulted in significant diuresis. Her weight had reduced from 202 down to 189. She then had to stop it for a few days because of some other medical problems are currently weight is up to 194. Her BNP associate with this was very low at 8 and renal function appeared to be stable. She reports a 75% improvement in breathing. Her repeat echo shows that EF remains stable with grade 1 diastolic dysfunction.  03/13/2016  Whitney Newton reports she's had a marked improvement in her swelling and breathing with additional diuretics. Weight has been fairly stable. She reports one episode of chest pain which was responsive to nitroglycerin. She's also had some reflux symptoms and is currently on Zegerid which appears to be helping her symptoms. Is not clear whether these episodes of chest discomfort are related to reflux or coronary ischemia. She did have a negative Myoview stress test in August 2016.  08/29/2016  Whitney Newton was seen today in the office. Over the past several day she's had worsening shortness of breath and lower extremity swelling. Her weight is now up about 6 pounds from her previous office visit. She said over the auscultation is been taking Lasix more regularly. She's currently taking 40 mg daily but as previously taken that for 3 days and alternating with 20 mg. She notes some improvement today after a couple days of diuretics.  10/08/2016  Whitney Newton returns today for follow-up. She has diuresed several pounds with increased dose diuretics are per creatinine rose and  therefore I decreased her diuretic back to 40 mg daily. She says she feels like she is over dried out on that dose of diuretic. She occasionally gets some cramping. She's also had some labile blood sugars which she attributes to the diuretics. In addition she had a recent bowel obstruction which may been related to diuresis.  Past Medical History:  Diagnosis Date  . Acute diastolic (congestive) heart failure ([place]) 12/25/2015  . Back pain    prior back surgery in 2009 - slow to recover  . Cancer ([place])   . Diabetes ([place])   . Diverticulitis   . Gastritis   . Hemangioma of liver    managed conservatively; followed at [facility] (hyperlipidemia)    statin intolerant  . Hypertension   . Normal cardiac stress test 2011  . Obesity   . Thyroid disease    hypothyroidism    Past Surgical History:  Procedure Laterality Date  . ABDOMINAL HYSTERECTOMY    . APPENDECTOMY    . NASAL RECONSTRUCTION  Current Outpatient Prescriptions  Medication Sig Dispense Refill  . aspirin 81 MG tablet Take 81 mg by mouth daily.    . bisoprolol-hydrochlorothiazide (ZIAC) 10-6.25 MG tablet TAKE (1) TABLET DAILY AS DIRECTED. 90 tablet 0  . clopidogrel (PLAVIX) 75 MG tablet TAKE 1 TABLET DAILY. 90 tablet 0  . colesevelam (WELCHOL) 625 MG tablet Take 3 tablets (1,875 mg total) by mouth 2 (two) times daily as needed (cholesterol). 180 tablet 6  . diazepam (VALIUM) 2 MG tablet Use as needed    . furosemide (LASIX) 20 MG tablet Take 20 mg by mouth daily.    Marland Kitchen gabapentin (NEURONTIN) 100 MG capsule Take 200 mg by mouth daily as needed (nerve pain).     Marland Kitchen glucose blood ([employer] BLOOD GLUCOSE TEST STRIPS) test strip Used to check Blood Sugar 4 times daily. Dx. Code 250.00    . HUMALOG KWIKPEN 100 UNIT/ML KiwkPen Use 6 at breakfast, 8 units at lunch and 12 at supper    . hyoscyamine (LEVBID) 0.375 MG 12 hr tablet Take 0.375 mg by mouth every 12 (twelve) hours as needed (cramps).     . insulin glargine (LANTUS) 100  UNIT/ML injection Inject 32 Units into the skin every morning.     . Lancets (ACCU-CHEK MULTICLIX) lancets Used to check Blood Sugar 4 times daily. Dx. Code 250.00.    Marland Kitchen levothyroxine (SYNTHROID, LEVOTHROID) 125 MCG tablet Take 125 mcg by mouth daily.    Marland Kitchen losartan (COZAAR) 25 MG tablet TAKE 1 TABLET ONCE DAILY. 90 tablet 3  . nitroGLYCERIN (NITROSTAT) 0.4 MG SL tablet DISSOLVE ONE TABLET UNDER TONGUE AS NEEDED FOR ARM/CHEST PAIN. 25 tablet PRN  . pantoprazole (PROTONIX) 40 MG tablet Take 1 tablet by mouth daily.    . potassium chloride SA (K-DUR,KLOR-CON) 20 MEQ tablet Take 20 mEq by mouth daily.     No current facility-administered medications for this visit.     Allergies:   Celecoxib; Ropinirole hcl; Actos [pioglitazone hydrochloride]; Crestor [rosuvastatin calcium]; Doxycycline; Fenofibrate; Imdur [isosorbide nitrate]; Lipitor [atorvastatin calcium]; Metformin and related; Niaspan [niacin er]; Pioglitazone; Pravachol; Ranolazine er; Rosuvastatin; and Zetia [ezetimibe]    Social History:  The patient  reports that she has never smoked. She has never used smokeless tobacco. She reports that she does not drink alcohol or use drugs.   Family History:  The patient's family history includes Dementia in her father; Heart attack in her brother and father; Heart disease in her brother and father; Hypertension in her mother; Stroke in her mother.    ROS:  Please see the history of present illness.   Otherwise, review of systems are positive for none.   All other systems are reviewed and negative.    PHYSICAL EXAM: VS:  BP (!) 147/71   Pulse 69   Ht 5\' 3"  (1.6 m)   Wt 196 lb 12.8 oz (89.3 kg)   BMI 34.86 kg/m  , BMI Body mass index is 34.86 kg/m. General appearance: alert, no distress and moderately obese Neck: no carotid bruit and no JVD Lungs: diminished breath sounds bilaterally Heart: regular rate and rhythm Abdomen: soft, non-tender; bowel sounds normal; no masses,  no  organomegaly Extremities: edema Trace pedal edema Pulses: 2+ and symmetric Skin: Skin color, texture, turgor normal. No rashes or lesions Neurologic: Grossly normal Psych: Mildly anxious  EKG:   Deferred  Recent Labs: 11/15/2015: ALT 14 08/29/2016: Brain Natriuretic Peptide 45.7; BUN 19; Creat 0.77; Potassium 3.9; Sodium 140    Lipid Panel  Component Value Date/Time   CHOL 209 (H) 11/15/2015 0954   TRIG 102 11/15/2015 0954   HDL 55 11/15/2015 0954   CHOLHDL 3.8 11/15/2015 0954   VLDL 20 11/15/2015 0954   LDLCALC 134 (H) 11/15/2015 0954   LDLDIRECT 148.7 09/14/2012 0857      Wt Readings from Last 3 Encounters:  10/08/16 196 lb 12.8 oz (89.3 kg)  08/29/16 198 lb (89.8 kg)  03/13/16 192 lb (87.1 kg)     ASSESSMENT AND PLAN:  1. Acute diastolic congestive heart failure 2. Hypertensive heart disease without heart failure 3. Diabetes mellitus. Recent A1c at the [facility] clinic was 8.0 4. Atypical chest pain with normal Myoview stress test in August 2016. No ischemia. Ejection fraction 85%. 70. old CVA followed at [facility] 6. History of dermatofibro- sarcoma of the spine. 7. Dyslipidemia 8. Osteoarthritis 9. OSA on CPAP [facility]) 10. Hypothyroidism followed at [facility]. 11. GERD, followed by Dr. Oletta Lamas   PLAN:  Whitney Newton has diuresed well on Lasix however feels like she is dried out too much. I like to decrease her Lasix back to 20 mg daily and her potassium back to 20 mEq daily. She should continue some oral water intake during the day as well. A1c is been poorly controlled with elevated blood sugars which she attributes to the diuretics. I provided a Lasix sliding scale which he can use if she were to gain weight or develop worsening swelling.  Follow-up in 6 months.  Pixie Casino, MD, [facility] Attending Cardiologist [facility]   10/08/2016 6:23 PM

## 2016-10-08 NOTE — Patient Instructions (Addendum)
Your physician has recommended you make the following change in your medication:  -- DECREASE lasix to 20mg  daily -- DECREASE potassium to 58mEq -- STOP extra hydrochlorothiazide   Your physician recommends that you schedule a follow-up appointment in: September 2018 with Dr. Debara Pickett

## 2016-10-25 ENCOUNTER — Other Ambulatory Visit: Payer: Self-pay | Admitting: Internal Medicine

## 2016-11-01 DIAGNOSIS — E039 Hypothyroidism, unspecified: Secondary | ICD-10-CM | POA: Diagnosis not present

## 2016-11-01 DIAGNOSIS — I672 Cerebral atherosclerosis: Secondary | ICD-10-CM | POA: Diagnosis not present

## 2016-11-01 DIAGNOSIS — I10 Essential (primary) hypertension: Secondary | ICD-10-CM | POA: Diagnosis not present

## 2016-11-01 DIAGNOSIS — Z794 Long term (current) use of insulin: Secondary | ICD-10-CM | POA: Diagnosis not present

## 2016-11-01 DIAGNOSIS — R42 Dizziness and giddiness: Secondary | ICD-10-CM | POA: Diagnosis not present

## 2016-11-01 DIAGNOSIS — E1165 Type 2 diabetes mellitus with hyperglycemia: Secondary | ICD-10-CM | POA: Diagnosis not present

## 2016-11-01 DIAGNOSIS — E785 Hyperlipidemia, unspecified: Secondary | ICD-10-CM | POA: Diagnosis not present

## 2016-11-01 DIAGNOSIS — R55 Syncope and collapse: Secondary | ICD-10-CM | POA: Diagnosis not present

## 2016-11-01 DIAGNOSIS — Z79899 Other long term (current) drug therapy: Secondary | ICD-10-CM | POA: Diagnosis not present

## 2016-11-01 DIAGNOSIS — R931 Abnormal findings on diagnostic imaging of heart and coronary circulation: Secondary | ICD-10-CM | POA: Diagnosis not present

## 2016-11-01 DIAGNOSIS — Z8673 Personal history of transient ischemic attack (TIA), and cerebral infarction without residual deficits: Secondary | ICD-10-CM | POA: Diagnosis not present

## 2016-11-01 DIAGNOSIS — E7800 Pure hypercholesterolemia, unspecified: Secondary | ICD-10-CM | POA: Diagnosis not present

## 2016-11-01 DIAGNOSIS — R9089 Other abnormal findings on diagnostic imaging of central nervous system: Secondary | ICD-10-CM | POA: Diagnosis not present

## 2016-11-01 DIAGNOSIS — I679 Cerebrovascular disease, unspecified: Secondary | ICD-10-CM | POA: Diagnosis not present

## 2016-11-01 DIAGNOSIS — E119 Type 2 diabetes mellitus without complications: Secondary | ICD-10-CM | POA: Diagnosis not present

## 2016-11-06 DIAGNOSIS — E113393 Type 2 diabetes mellitus with moderate nonproliferative diabetic retinopathy without macular edema, bilateral: Secondary | ICD-10-CM | POA: Diagnosis not present

## 2016-11-07 ENCOUNTER — Other Ambulatory Visit: Payer: Self-pay | Admitting: Cardiology

## 2016-11-07 DIAGNOSIS — Z6836 Body mass index (BMI) 36.0-36.9, adult: Secondary | ICD-10-CM | POA: Diagnosis not present

## 2016-11-07 DIAGNOSIS — Z01419 Encounter for gynecological examination (general) (routine) without abnormal findings: Secondary | ICD-10-CM | POA: Diagnosis not present

## 2016-11-07 DIAGNOSIS — Z1231 Encounter for screening mammogram for malignant neoplasm of breast: Secondary | ICD-10-CM | POA: Diagnosis not present

## 2016-11-18 ENCOUNTER — Telehealth: Payer: Self-pay | Admitting: Internal Medicine

## 2016-11-18 NOTE — Telephone Encounter (Signed)
Whitney Newton came in with a registration plate renewal for handicap and wanted Dr. Debara Pickett to sign it.  Dr. Debara Pickett is out of the office this week.  I left the form with Jenna to have Dr. Debara Pickett sign it.

## 2016-11-22 DIAGNOSIS — L309 Dermatitis, unspecified: Secondary | ICD-10-CM | POA: Diagnosis not present

## 2016-11-26 ENCOUNTER — Telehealth: Payer: Self-pay | Admitting: Internal Medicine

## 2016-11-26 NOTE — Telephone Encounter (Signed)
Patient notified that application for handicapped drivers registration plate is completed and ready for pick up. Copy made for records.

## 2016-11-29 DIAGNOSIS — D2271 Melanocytic nevi of right lower limb, including hip: Secondary | ICD-10-CM | POA: Diagnosis not present

## 2016-11-29 DIAGNOSIS — L821 Other seborrheic keratosis: Secondary | ICD-10-CM | POA: Diagnosis not present

## 2016-11-29 DIAGNOSIS — L570 Actinic keratosis: Secondary | ICD-10-CM | POA: Diagnosis not present

## 2016-11-29 DIAGNOSIS — D225 Melanocytic nevi of trunk: Secondary | ICD-10-CM | POA: Diagnosis not present

## 2016-11-29 DIAGNOSIS — D171 Benign lipomatous neoplasm of skin and subcutaneous tissue of trunk: Secondary | ICD-10-CM | POA: Diagnosis not present

## 2016-11-29 DIAGNOSIS — D2262 Melanocytic nevi of left upper limb, including shoulder: Secondary | ICD-10-CM | POA: Diagnosis not present

## 2016-12-06 DIAGNOSIS — L309 Dermatitis, unspecified: Secondary | ICD-10-CM | POA: Diagnosis not present

## 2016-12-06 DIAGNOSIS — E1165 Type 2 diabetes mellitus with hyperglycemia: Secondary | ICD-10-CM | POA: Diagnosis not present

## 2016-12-06 DIAGNOSIS — Z794 Long term (current) use of insulin: Secondary | ICD-10-CM | POA: Diagnosis not present

## 2016-12-06 DIAGNOSIS — Z0000 Encounter for general adult medical examination without abnormal findings: Secondary | ICD-10-CM | POA: Diagnosis not present

## 2016-12-31 DIAGNOSIS — D1803 Hemangioma of intra-abdominal structures: Secondary | ICD-10-CM | POA: Diagnosis not present

## 2016-12-31 DIAGNOSIS — C7A8 Other malignant neuroendocrine tumors: Secondary | ICD-10-CM | POA: Diagnosis not present

## 2017-03-06 ENCOUNTER — Encounter: Payer: Self-pay | Admitting: Internal Medicine

## 2017-03-06 ENCOUNTER — Ambulatory Visit (INDEPENDENT_AMBULATORY_CARE_PROVIDER_SITE_OTHER): Payer: Medicare Other | Admitting: Internal Medicine

## 2017-03-06 VITALS — BP 132/78 | HR 66 | Ht 63.0 in | Wt 203.6 lb

## 2017-03-06 DIAGNOSIS — I10 Essential (primary) hypertension: Secondary | ICD-10-CM | POA: Diagnosis not present

## 2017-03-06 DIAGNOSIS — I5031 Acute diastolic (congestive) heart failure: Secondary | ICD-10-CM | POA: Diagnosis not present

## 2017-03-06 DIAGNOSIS — R600 Localized edema: Secondary | ICD-10-CM

## 2017-03-06 NOTE — Patient Instructions (Addendum)
Your physician has recommended you make the following change in your medication: TAKE lasix as prescribed  Your physician wants you to follow-up in: 6 months with Dr. Debara Pickett. You will receive a reminder letter in the mail two months in advance. If you don't receive a letter, please call our office to schedule the follow-up appointment.   Dr. Debara Pickett recommends KNEE HIGH COMPRESSION STOCKINGS  -- 20-30 mmHg (compression strength) -- [facility]  -- [address]  -- [place]   -- [phone]  How to Use Compression Stockings Compression stockings are elastic socks that squeeze the legs. They help to increase blood flow to the legs, decrease swelling in the legs, and reduce the chance of developing blood clots in the lower legs. Compression stockings are often used by people who:  Are recovering from surgery.  Have poor circulation in their legs.  Are prone to getting blood clots in their legs.  Have varicose veins.  Sit or stay in bed for long periods of time. How to use compression stockings Before you put on your compression stockings:  Make sure that they are the correct size. If you do not know your size, ask your health care provider.  Make sure that they are clean, dry, and in good condition.  Check them for rips and tears. Do not put them on if they are ripped or torn. Put your stockings on first thing in the morning, before you get out of bed. Keep them on for as long as your health care provider advises. When you are wearing your stockings:  Keep them as smooth as possible. Do not allow them to bunch up. It is especially important to prevent the stockings from bunching up around your toes or behind your knees.  Do not roll the stockings downward and leave them rolled down. This can decrease blood flow to your leg.  Change them right away if they become wet or dirty. When you take  off your stockings, inspect your legs and feet. Anything that does not seem normal may require medical attention. Look for:  Open sores.  Red spots.  Swelling. Information and tips  Do not stop wearing your compression stockings without talking to your health care provider first.  Wash your stockings every day with mild detergent in cold or warm water. Do not use bleach. Air-dry your stockings or dry them in a clothes dryer on low heat.  Replace your stockings every 3-6 months.  If skin moisturizing is part of your treatment plan, apply lotion or cream at night so that your skin will be dry when you put on the stockings in the morning. It is harder to put the stockings on when you have lotion on your legs or feet. Contact a health care provider if: Remove your stockings and seek medical care if:  You have a feeling of pins and needles in your feet or legs.  You have any new changes in your skin.  You have skin lesions that are getting worse.  You have swelling or pain that is getting worse. Get help right away if:  You have numbness or tingling in your lower legs that does not get better right after you take the stockings off.  Your toes or feet become cold and blue.  You develop open sores or red spots on your legs that do not go away.  You see or feel a warm spot  on your leg.  You have new swelling or soreness in your leg.  You are short of breath or you have chest pain for no reason.  You have a rapid or irregular heartbeat.  You feel light-headed or dizzy. This information is not intended to replace advice given to you by your health care provider. Make sure you discuss any questions you have with your health care provider. Document Released: 03/24/2009 Document Revised: 10/25/2015 Document Reviewed: 05/04/2014 Elsevier Interactive Patient Education  2017 [employer].

## 2017-03-06 NOTE — Progress Notes (Signed)
Cardiology Office Note   Date:  03/06/2017   ID:  ARHIANNA EBEY, DOB 04-15-1941, MRN [id number]  PCP:  Aurea Graff.Marlou Sa, MD   CC: Leg swelling, weight gain  History of Present Illness: Whitney Newton is a 76 y.o. female who presents for  Four-month follow-up visit  This pleasant 76 year old woman is seen for a scheduled followup visit. The patient has a history of having had a stroke in early December 2014. She was at her diabetes clinic at [facility] and they diagnosed her and sent her straight to neurology where she was hospitalized for 3 days. The MRI showed a new stroke on the right side. She had carotid Dopplers which did not show any obstructive lesions but she did have some plaque and they put her back on aspirin and continued her Plavix. The patient had a echocardiogram which was a bubble study and did not show any evidence of right-to-left shunt. More recently, she has had further neurology workup at [facility] on 06/20/14. She had a transcranial Doppler. 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 Previous carotid Dopplers had not shown any significant external cranial disease She has a history of essential hypertension, diabetes mellitus, and dyslipidemia. She has had atypical chest pain. She is intolerant of statin drugs. We updated her lexiscan Myoview stress test on 03/17/12 and it was normal showing no evidence of ischemia and her ejection fraction was 78%. The patient has never had a cardiac catheterization. She has continued to have some shortness of breath. She had an echocardiogram in 04/07/12 which showed an ejection fraction of 55-65% with grade 1 diastolic dysfunction. There is trivial aortic insufficiency.  The patient has a history of essential hypertension as well as atypical chest pain. Her diabetes is now being  followed at the [facility] clinic at [facility]. Since we last saw her she had an ophthalmology evaluation on 07/27/14 by Dr. Kathrin Penner and no diabetic retinopathy was found. Her last chest x-ray was on 12/21/12 elsewhere and showed a normal heart size and low lung volumes. She had a recent CT scan of the chest on 03/23/13 at [facility] which showed normal heart size and demonstrated atherosclerosis of the aorta.  The patient has a past history of dermatofibrosarcoma of the spine. She had surgery for this at [facility]. She also has a past history of angiolipoma of the abdominal wall.  Since last visit she has not been experiencing any new cardiac symptoms. She does have sleep apnea. She uses a CPAP machine. She has had some low back pain and pain in her hip radiating to her knees. She has seen Dr. Durward Fortes. She was admitted to the hospital on 01/18/15 for chest and abdominal pain. She had a CTA of the chest on 01/18/15 which was negative for pulmonary embolus. The following day she had a Myoview on 01/19/15 which showed no ischemia and her ejection fraction was 85%. She had a lot of side effects from the [facility] It was felt in retrospect that her presenting complaints were probably GI in origin rather than cardiac. She does have a history of GERD and sees Dr. Oletta Lamas.  since last visit she has had no new cardiac symptoms.  She has not been having any recent chest discomfort. Her weight is unchanged and her blood pressures been stable.  11/22/2015  Mrs. Butrick presents  today to establish care with me. She is a previous patient of Dr. Warren Danes. She recently was at the beach and developed some worsening shortness of breath and leg swelling. Her nephew was there who came down from [place] with a head cold and she seems to have symptoms now at this time. She was also seen at an urgent care there and placed on amoxicillin. She's taken that for a few days. She notes  significant swelling in her weight is much higher than it had been in the past. She endorses a high salt intake. Her last echocardiogram was in 2013 which showed an EF of 55-65% with grade 1 diastolic dysfunction. She had a stress test in 2016 for chest pain which showed an EF of 85%.  12/25/2015  I saw [name] that today in the office. She has been taking the Lasix 40 mg daily which was over a week and this resulted in significant diuresis. Her weight had reduced from 202 down to 189. She then had to stop it for a few days because of some other medical problems are currently weight is up to 194. Her BNP associate with this was very low at 8 and renal function appeared to be stable. She reports a 75% improvement in breathing. Her repeat echo shows that EF remains stable with grade 1 diastolic dysfunction.  03/13/2016  [name] reports she's had a marked improvement in her swelling and breathing with additional diuretics. Weight has been fairly stable. She reports one episode of chest pain which was responsive to nitroglycerin. She's also had some reflux symptoms and is currently on Zegerid which appears to be helping her symptoms. Is not clear whether these episodes of chest discomfort are related to reflux or coronary ischemia. She did have a negative Myoview stress test in August 2016.  08/29/2016  Mrs. Owensby was seen today in the office. Over the past several day she's had worsening shortness of breath and lower extremity swelling. Her weight is now up about 6 pounds from her previous office visit. She said over the auscultation is been taking Lasix more regularly. She's currently taking 40 mg daily but as previously taken that for 3 days and alternating with 20 mg. She notes some improvement today after a couple days of diuretics.  10/08/2016  [name] returns today for follow-up. She has diuresed several pounds with increased dose diuretics are per creatinine rose and therefore I  decreased her diuretic back to 40 mg daily. She says she feels like she is over dried out on that dose of diuretic. She occasionally gets some cramping. She's also had some labile blood sugars which she attributes to the diuretics. In addition she had a recent bowel obstruction which may been related to diuresis.  03/06/2017  Kasha was seen today in follow-up. She recently got back from a trip to assess catch one with her husband. She said they were traveling for about 16 days and she did not take her Lasix during that period of time. Her weight is up about 6 pounds that she reports lower extremity swelling. She has a sliding scale Lasix to take but has not been compliant with that.  Past Medical History:  Diagnosis Date  . Acute diastolic (congestive) heart failure ([place]) 12/25/2015  . Back pain    prior back surgery in 2009 - slow to recover  . Cancer ([place])   . Diabetes ([place])   . Diverticulitis   . Gastritis   . Hemangioma  of liver    managed conservatively; followed at [place] (hyperlipidemia)    statin intolerant  . Hypertension   . Normal cardiac stress test 2011  . Obesity   . Thyroid disease    hypothyroidism    Past Surgical History:  Procedure Laterality Date  . ABDOMINAL HYSTERECTOMY    . APPENDECTOMY    . NASAL RECONSTRUCTION       Current Outpatient Prescriptions  Medication Sig Dispense Refill  . aspirin 81 MG tablet Take 81 mg by mouth daily.    . bisoprolol-hydrochlorothiazide (ZIAC) 10-6.25 MG tablet TAKE (1) TABLET DAILY AS DIRECTED. 90 tablet 3  . clopidogrel (PLAVIX) 75 MG tablet TAKE 1 TABLET DAILY. 90 tablet 3  . colesevelam (WELCHOL) 625 MG tablet Take 3 tablets (1,875 mg total) by mouth 2 (two) times daily as needed (cholesterol). 180 tablet 6  . furosemide (LASIX) 20 MG tablet Take 20 mg by mouth daily.    Marland Kitchen gabapentin (NEURONTIN) 100 MG capsule Take 200 mg by mouth daily as needed (nerve pain).     Marland Kitchen glucose blood ([facility] BLOOD GLUCOSE TEST STRIPS)  test strip Used to check Blood Sugar 4 times daily. Dx. Code 250.00    . HUMALOG KWIKPEN 100 UNIT/ML KiwkPen Use 7 at breakfast, 9 units at lunch and 12 at supper    . hyoscyamine (LEVBID) 0.375 MG 12 hr tablet Take 0.375 mg by mouth every 12 (twelve) hours as needed (cramps).     . insulin glargine (LANTUS) 100 UNIT/ML injection Inject 32 Units into the skin every morning.     . Lancets (ACCU-CHEK MULTICLIX) lancets Used to check Blood Sugar 4 times daily. Dx. Code 250.00.    Marland Kitchen levothyroxine (SYNTHROID, LEVOTHROID) 125 MCG tablet Take 125 mcg by mouth daily.    Marland Kitchen losartan (COZAAR) 25 MG tablet TAKE 1 TABLET ONCE DAILY. 90 tablet 3  . nitroGLYCERIN (NITROSTAT) 0.4 MG SL tablet DISSOLVE ONE TABLET UNDER TONGUE AS NEEDED FOR ARM/CHEST PAIN. 25 tablet PRN  . pantoprazole (PROTONIX) 40 MG tablet Take 1 tablet by mouth daily.    . potassium chloride SA (K-DUR,KLOR-CON) 20 MEQ tablet Take 20 mEq by mouth daily.     No current facility-administered medications for this visit.     Allergies:   Celecoxib; Ropinirole hcl; Actos [pioglitazone hydrochloride]; Crestor [rosuvastatin calcium]; Doxycycline; Fenofibrate; Imdur [isosorbide nitrate]; Lipitor [atorvastatin calcium]; Metformin and related; Niaspan [niacin er]; Pioglitazone; Pravachol; Ranolazine er; Rosuvastatin; and Zetia [ezetimibe]    Social History:  The patient  reports that she has never smoked. She has never used smokeless tobacco. She reports that she does not drink alcohol or use drugs.   Family History:  The patient's family history includes Dementia in her father; Heart attack in her brother and father; Heart disease in her brother and father; Hypertension in her mother; Stroke in her mother.    ROS:   Pertinent items noted in HPI and remainder of comprehensive ROS otherwise negative.  PHYSICAL EXAM: VS:  BP 132/78   Pulse 66   Ht 5\' 3"  (1.6 m)   Wt 203 lb 9.6 oz (92.4 kg)   BMI 36.07 kg/m  , BMI Body mass index is 36.07  kg/m. General appearance: alert, no distress and moderately obese Neck: no carotid bruit and no JVD Lungs: diminished breath sounds bilaterally Heart: regular rate and rhythm Abdomen: soft, non-tender; bowel sounds normal; no masses,  no organomegaly Extremities: edema 1+ pedal edema Pulses: 2+ and symmetric Skin: Skin color,  texture, turgor normal. No rashes or lesions Neurologic: Grossly normal Psych: Mildly anxious  EKG:   Normal sinus rhythm at 66, incomplete right bundle branch block-personally reviewed  Recent Labs: 08/29/2016: Brain Natriuretic Peptide 45.7; BUN 19; Creat 0.77; Potassium 3.9; Sodium 140    Lipid Panel    Component Value Date/Time   CHOL 209 (H) 11/15/2015 0954   TRIG 102 11/15/2015 0954   HDL 55 11/15/2015 0954   CHOLHDL 3.8 11/15/2015 0954   VLDL 20 11/15/2015 0954   LDLCALC 134 (H) 11/15/2015 0954   LDLDIRECT 148.7 09/14/2012 0857      Wt Readings from Last 3 Encounters:  03/06/17 203 lb 9.6 oz (92.4 kg)  10/08/16 196 lb 12.8 oz (89.3 kg)  08/29/16 198 lb (89.8 kg)     ASSESSMENT AND PLAN:  1. Acute diastolic congestive heart failure 2. Hypertensive heart disease without heart failure 3. Diabetes mellitus. Recent A1c at the [facility] clinic was 8.0 4. Atypical chest pain with normal Myoview stress test in August 2016. No ischemia. Ejection fraction 85%. 32. old CVA followed at [facility] 6. History of dermatofibro- sarcoma of the spine. 7. Dyslipidemia 8. Osteoarthritis 9. OSA on CPAP [facility]) 10. Hypothyroidism followed at [facility]. 11. GERD, followed by Dr. Oletta Lamas   PLAN:  Mrs. Eshelman Again has lower extremity edema however she did not take her Lasix essentially for 2 weeks. She has problems with incontinence and therefore does not use the medicine regularly however I reminded her that it may be progressively more difficult to treat her edema if she does not treated aggressively upfront. She's advised to go back to  taking a daily Lasix.  Follow-up in 6 months.  Pixie Casino, MD, [facility] Attending Cardiologist [facility]   03/06/2017 2:30 PM

## 2017-03-19 DIAGNOSIS — Z794 Long term (current) use of insulin: Secondary | ICD-10-CM | POA: Diagnosis not present

## 2017-03-19 DIAGNOSIS — E1165 Type 2 diabetes mellitus with hyperglycemia: Secondary | ICD-10-CM | POA: Diagnosis not present

## 2017-03-19 DIAGNOSIS — E785 Hyperlipidemia, unspecified: Secondary | ICD-10-CM | POA: Diagnosis not present

## 2017-03-19 DIAGNOSIS — E039 Hypothyroidism, unspecified: Secondary | ICD-10-CM | POA: Diagnosis not present

## 2017-03-19 DIAGNOSIS — Z23 Encounter for immunization: Secondary | ICD-10-CM | POA: Diagnosis not present

## 2017-03-19 DIAGNOSIS — I10 Essential (primary) hypertension: Secondary | ICD-10-CM | POA: Diagnosis not present

## 2017-03-19 DIAGNOSIS — E119 Type 2 diabetes mellitus without complications: Secondary | ICD-10-CM | POA: Diagnosis not present

## 2017-03-24 DIAGNOSIS — I509 Heart failure, unspecified: Secondary | ICD-10-CM | POA: Diagnosis not present

## 2017-03-24 DIAGNOSIS — G4733 Obstructive sleep apnea (adult) (pediatric): Secondary | ICD-10-CM | POA: Diagnosis not present

## 2017-03-24 DIAGNOSIS — Z9989 Dependence on other enabling machines and devices: Secondary | ICD-10-CM | POA: Diagnosis not present

## 2017-03-24 DIAGNOSIS — E119 Type 2 diabetes mellitus without complications: Secondary | ICD-10-CM | POA: Diagnosis not present

## 2017-03-24 DIAGNOSIS — G2581 Restless legs syndrome: Secondary | ICD-10-CM | POA: Diagnosis not present

## 2017-03-24 DIAGNOSIS — Z8673 Personal history of transient ischemic attack (TIA), and cerebral infarction without residual deficits: Secondary | ICD-10-CM | POA: Diagnosis not present

## 2017-03-24 DIAGNOSIS — K219 Gastro-esophageal reflux disease without esophagitis: Secondary | ICD-10-CM | POA: Diagnosis not present

## 2017-03-24 DIAGNOSIS — Z794 Long term (current) use of insulin: Secondary | ICD-10-CM | POA: Diagnosis not present

## 2017-03-24 DIAGNOSIS — E669 Obesity, unspecified: Secondary | ICD-10-CM | POA: Diagnosis not present

## 2017-03-24 DIAGNOSIS — Z6837 Body mass index (BMI) 37.0-37.9, adult: Secondary | ICD-10-CM | POA: Diagnosis not present

## 2017-03-24 DIAGNOSIS — I110 Hypertensive heart disease with heart failure: Secondary | ICD-10-CM | POA: Diagnosis not present

## 2017-04-08 DIAGNOSIS — Z888 Allergy status to other drugs, medicaments and biological substances status: Secondary | ICD-10-CM | POA: Diagnosis not present

## 2017-04-08 DIAGNOSIS — I6602 Occlusion and stenosis of left middle cerebral artery: Secondary | ICD-10-CM | POA: Diagnosis not present

## 2017-04-08 DIAGNOSIS — R202 Paresthesia of skin: Secondary | ICD-10-CM | POA: Diagnosis not present

## 2017-04-08 DIAGNOSIS — Z79899 Other long term (current) drug therapy: Secondary | ICD-10-CM | POA: Diagnosis not present

## 2017-04-08 DIAGNOSIS — G2581 Restless legs syndrome: Secondary | ICD-10-CM | POA: Diagnosis not present

## 2017-04-08 DIAGNOSIS — I10 Essential (primary) hypertension: Secondary | ICD-10-CM | POA: Diagnosis not present

## 2017-04-08 DIAGNOSIS — Z881 Allergy status to other antibiotic agents status: Secondary | ICD-10-CM | POA: Diagnosis not present

## 2017-04-08 DIAGNOSIS — E119 Type 2 diabetes mellitus without complications: Secondary | ICD-10-CM | POA: Diagnosis not present

## 2017-04-08 DIAGNOSIS — G4733 Obstructive sleep apnea (adult) (pediatric): Secondary | ICD-10-CM | POA: Diagnosis not present

## 2017-04-08 DIAGNOSIS — E039 Hypothyroidism, unspecified: Secondary | ICD-10-CM | POA: Diagnosis not present

## 2017-04-08 DIAGNOSIS — G5713 Meralgia paresthetica, bilateral lower limbs: Secondary | ICD-10-CM | POA: Insufficient documentation

## 2017-04-08 DIAGNOSIS — Z7982 Long term (current) use of aspirin: Secondary | ICD-10-CM | POA: Diagnosis not present

## 2017-04-08 DIAGNOSIS — Z9989 Dependence on other enabling machines and devices: Secondary | ICD-10-CM | POA: Diagnosis not present

## 2017-04-08 DIAGNOSIS — Z7902 Long term (current) use of antithrombotics/antiplatelets: Secondary | ICD-10-CM | POA: Diagnosis not present

## 2017-04-08 DIAGNOSIS — E7800 Pure hypercholesterolemia, unspecified: Secondary | ICD-10-CM | POA: Diagnosis not present

## 2017-04-08 DIAGNOSIS — Z794 Long term (current) use of insulin: Secondary | ICD-10-CM | POA: Diagnosis not present

## 2017-04-08 DIAGNOSIS — H8111 Benign paroxysmal vertigo, right ear: Secondary | ICD-10-CM | POA: Insufficient documentation

## 2017-04-08 DIAGNOSIS — Z8673 Personal history of transient ischemic attack (TIA), and cerebral infarction without residual deficits: Secondary | ICD-10-CM | POA: Diagnosis not present

## 2017-04-08 DIAGNOSIS — I771 Stricture of artery: Secondary | ICD-10-CM | POA: Insufficient documentation

## 2017-04-08 DIAGNOSIS — M79659 Pain in unspecified thigh: Secondary | ICD-10-CM | POA: Diagnosis not present

## 2017-04-08 DIAGNOSIS — E785 Hyperlipidemia, unspecified: Secondary | ICD-10-CM | POA: Diagnosis not present

## 2017-05-06 DIAGNOSIS — H43813 Vitreous degeneration, bilateral: Secondary | ICD-10-CM | POA: Diagnosis not present

## 2017-05-06 DIAGNOSIS — H35371 Puckering of macula, right eye: Secondary | ICD-10-CM | POA: Diagnosis not present

## 2017-05-06 DIAGNOSIS — E113293 Type 2 diabetes mellitus with mild nonproliferative diabetic retinopathy without macular edema, bilateral: Secondary | ICD-10-CM | POA: Diagnosis not present

## 2017-06-13 ENCOUNTER — Other Ambulatory Visit: Payer: Self-pay | Admitting: Internal Medicine

## 2017-06-13 IMAGING — RF DG ESOPHAGUS
7 of 8 series · 19 of 24 positions shown · non-contrast
Comparison: None.

CLINICAL DATA: Dysphagia unspecified

EXAM:
ESOPHOGRAM / BARIUM SWALLOW / BARIUM TABLET STUDY
TECHNIQUE: Combined double contrast and single contrast examination performed
using effervescent crystals, thick barium liquid, and thin barium
liquid. The patient was observed with fluoroscopy swallowing a 13 mm
barium sulphate tablet.
FLUOROSCOPY TIME:  Fluoroscopy Time:  1 minutes 12 second
Radiation Exposure Index (if provided by the fluoroscopic device):
Number of Acquired Spot Images: 0

[Series 1: run · 13 of 21 slices shown (1 of 7)]
[im 1/21]
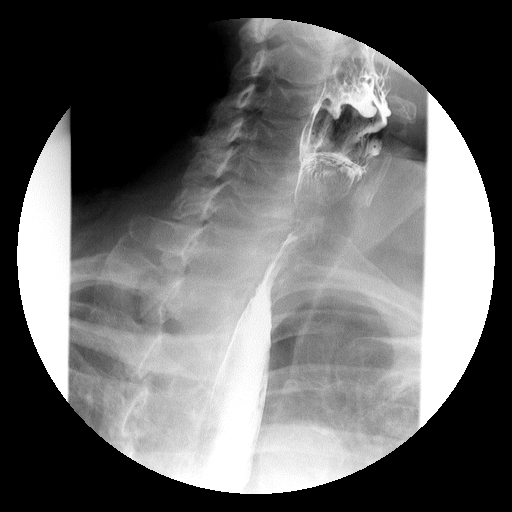
[im 2/21]
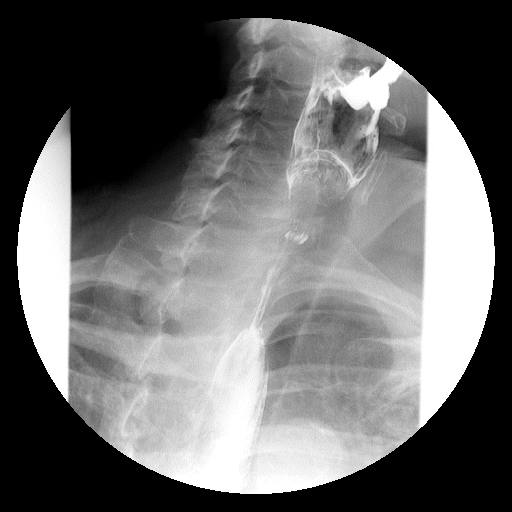
[im 4/21]
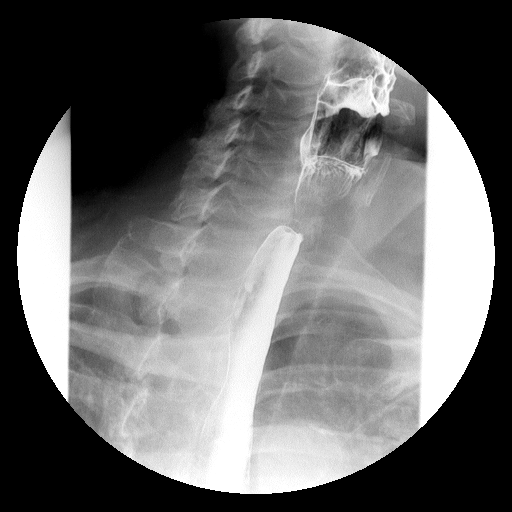
[im 6/21]
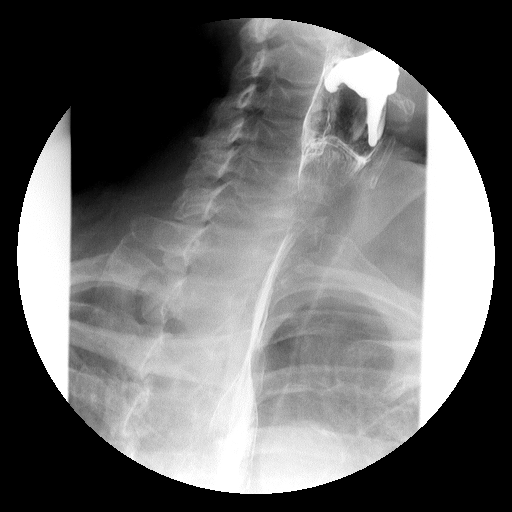
[im 7/21]
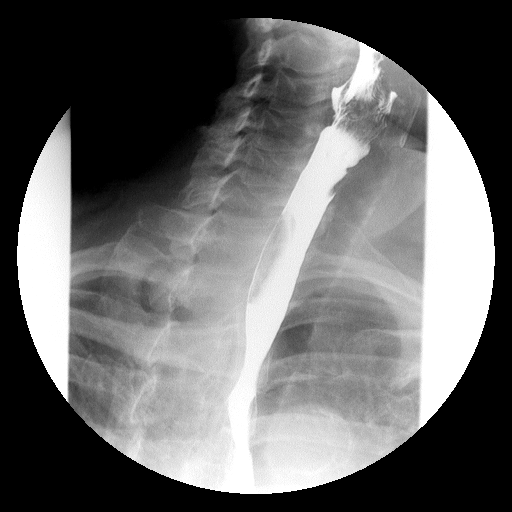
[im 8/21]
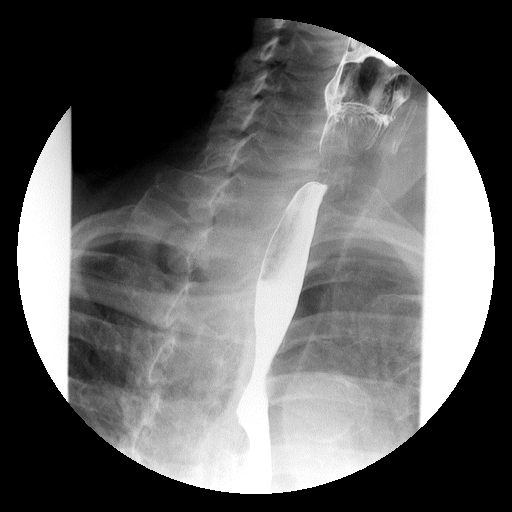
[im 11/21]
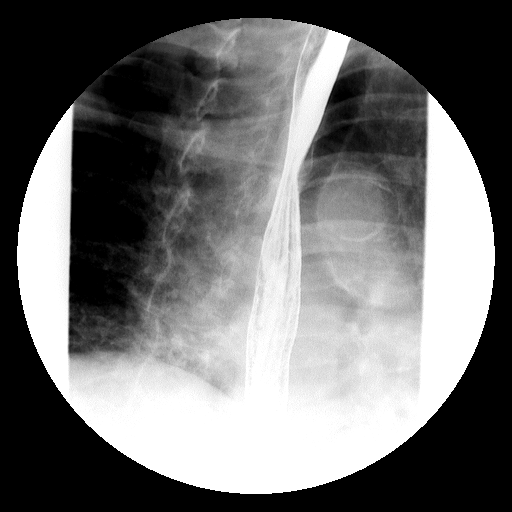
[im 12/21]
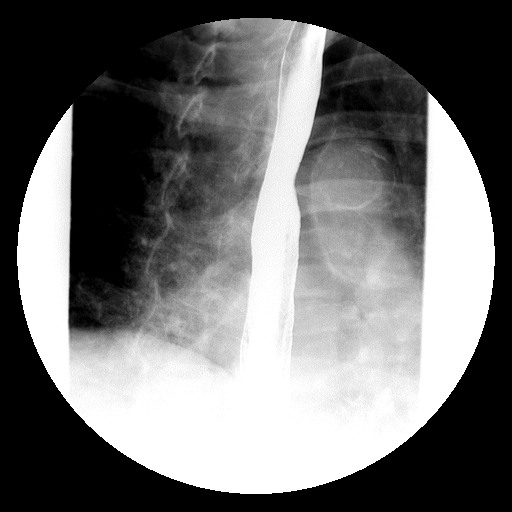
[im 13/21]
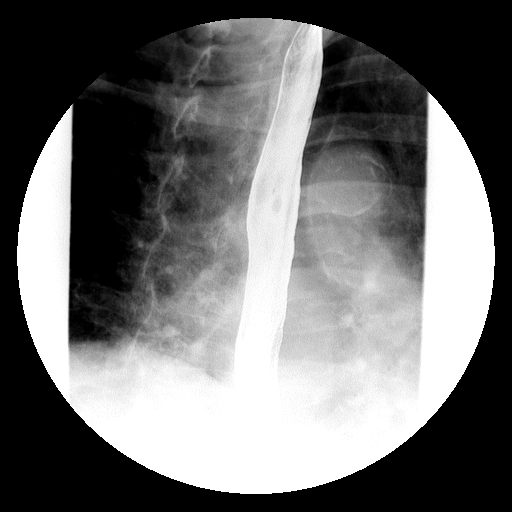
[im 16/21]
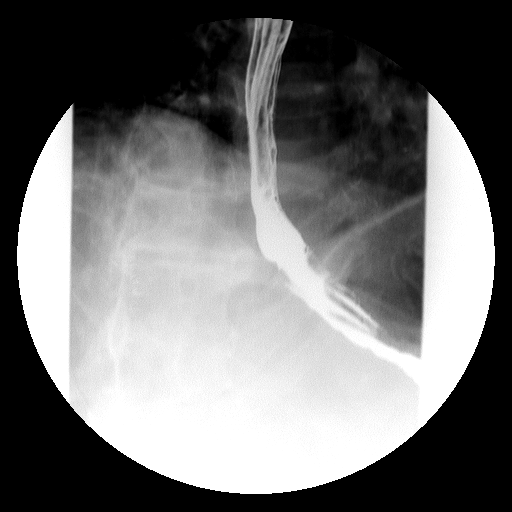
[im 17/21]
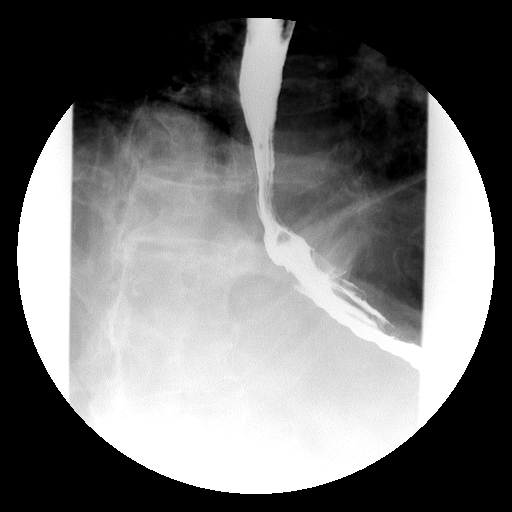
[im 18/21]
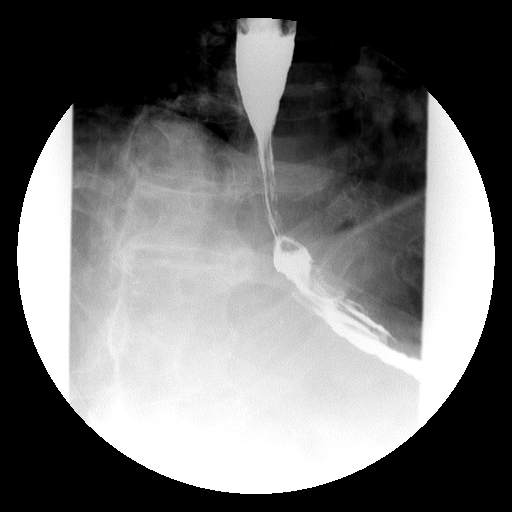
[im 19/21]
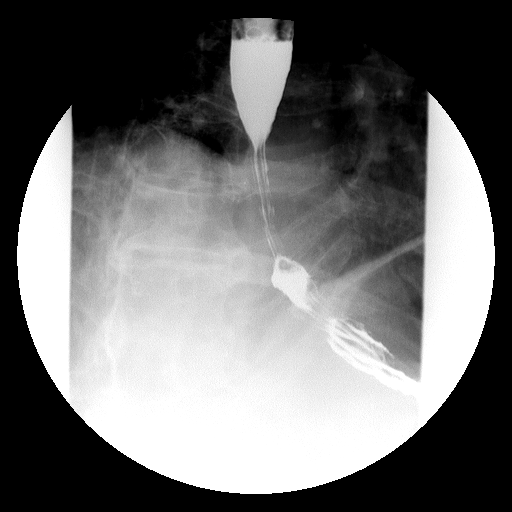

[Series 2: run · 1 of 1 slices shown (2 of 7)]
[im 1/1]
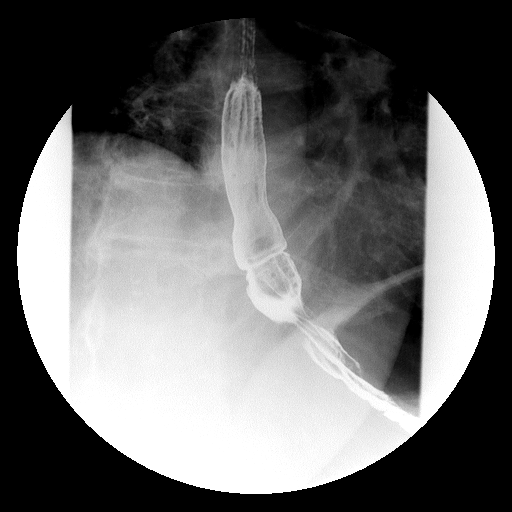

[Series 3: run · 1 of 1 slices shown (3 of 7)]
[im 1/1]
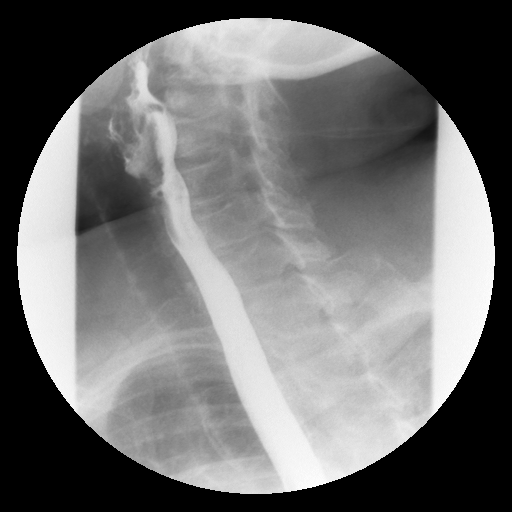

[Series 4: run · 1 of 1 slices shown (4 of 7)]
[im 1/1]
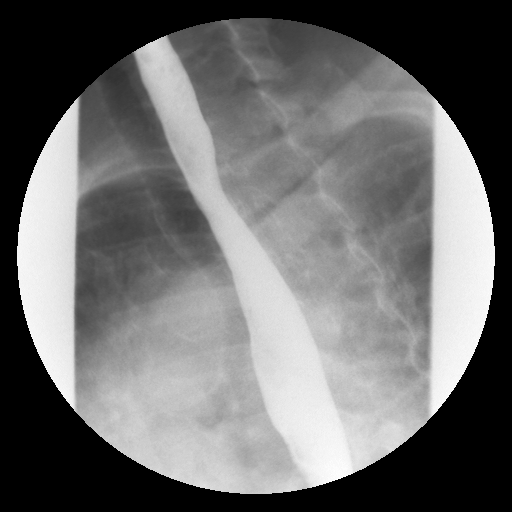

[Series 5: run · 1 of 1 slices shown (5 of 7)]
[im 1/1]
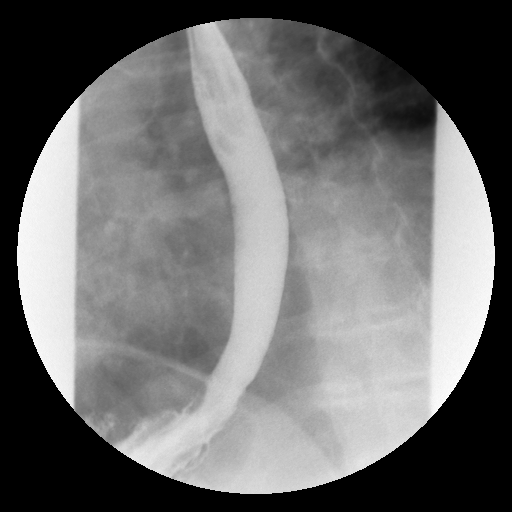

[Series 7: run · 1 of 1 slices shown (6 of 7)]
[im 1/1]
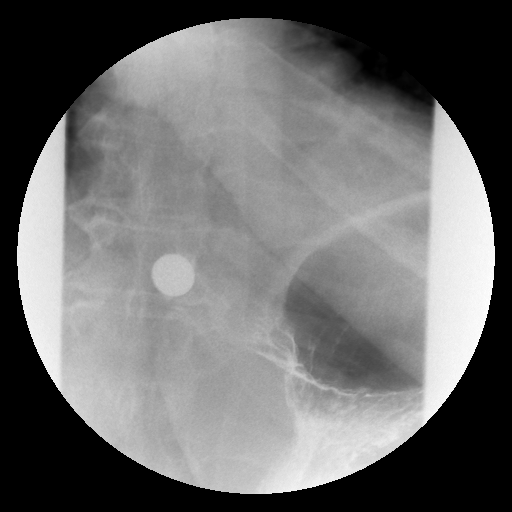

[Series 8: run · 1 of 1 slices shown (7 of 7)]
[im 1/1]
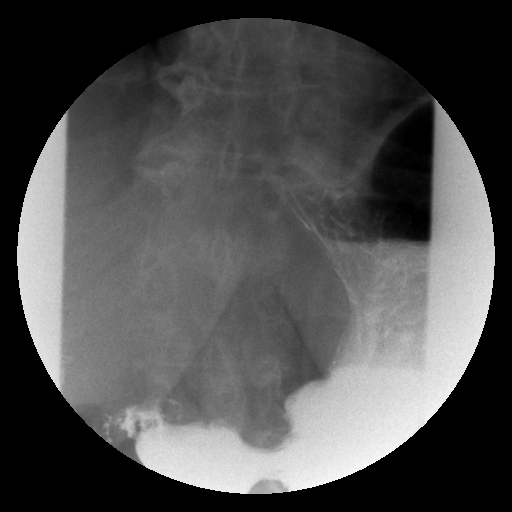

[19 of 24 positions shown; findings below may reference images not displayed]

FINDINGS: Patient had limited mobility.

Mild decrease in esophageal motility. No esophageal mass or
stricture. Small hiatal hernia. No reflux identified.

Barium tablet passed readily into the stomach without delay.
IMPRESSION: Small hiatal hernia without stricture or mass. Barium tablet passed
into the stomach without delay.

## 2017-07-04 DIAGNOSIS — E039 Hypothyroidism, unspecified: Secondary | ICD-10-CM | POA: Diagnosis not present

## 2017-07-04 DIAGNOSIS — Z794 Long term (current) use of insulin: Secondary | ICD-10-CM | POA: Diagnosis not present

## 2017-07-04 DIAGNOSIS — E1165 Type 2 diabetes mellitus with hyperglycemia: Secondary | ICD-10-CM | POA: Diagnosis not present

## 2017-07-18 DIAGNOSIS — E113293 Type 2 diabetes mellitus with mild nonproliferative diabetic retinopathy without macular edema, bilateral: Secondary | ICD-10-CM | POA: Diagnosis not present

## 2017-07-18 DIAGNOSIS — Z961 Presence of intraocular lens: Secondary | ICD-10-CM | POA: Diagnosis not present

## 2017-07-18 DIAGNOSIS — H43813 Vitreous degeneration, bilateral: Secondary | ICD-10-CM | POA: Diagnosis not present

## 2017-07-22 ENCOUNTER — Telehealth: Payer: Self-pay | Admitting: Internal Medicine

## 2017-07-22 NOTE — Telephone Encounter (Signed)
Returned call to patient, patient states she called pharmacy and her Losartan is not affected.  Advised to continue this medication.  Patient aware and verbalized understanding.

## 2017-07-22 NOTE — Telephone Encounter (Signed)
Pt c/o medication issue:  1. Name of [place]  2. How are you currently taking this medication (dosage and times per day)? 25 mg   3. Are you having a reaction (difficulty breathing--STAT)? no  4. What is your medication issue? Being recalled

## 2017-08-01 DIAGNOSIS — Z1211 Encounter for screening for malignant neoplasm of colon: Secondary | ICD-10-CM | POA: Diagnosis not present

## 2017-08-12 ENCOUNTER — Ambulatory Visit
Admission: RE | Admit: 2017-08-12 | Discharge: 2017-08-12 | Disposition: A | Discharge status: Home / Self Care | Payer: Medicare Other | Source: Ambulatory Visit | Attending: Family Medicine | Admitting: Family Medicine

## 2017-08-12 ENCOUNTER — Other Ambulatory Visit: Payer: Self-pay | Admitting: Family Medicine

## 2017-08-12 DIAGNOSIS — R0781 Pleurodynia: Secondary | ICD-10-CM | POA: Diagnosis not present

## 2017-08-19 ENCOUNTER — Other Ambulatory Visit: Payer: Self-pay | Admitting: Gastroenterology

## 2017-08-19 DIAGNOSIS — K921 Melena: Secondary | ICD-10-CM | POA: Diagnosis not present

## 2017-08-19 DIAGNOSIS — R1011 Right upper quadrant pain: Secondary | ICD-10-CM | POA: Diagnosis not present

## 2017-08-19 DIAGNOSIS — C4499 Other specified malignant neoplasm of skin, unspecified: Secondary | ICD-10-CM | POA: Diagnosis not present

## 2017-08-20 ENCOUNTER — Encounter: Payer: Self-pay | Admitting: Internal Medicine

## 2017-08-20 ENCOUNTER — Ambulatory Visit (INDEPENDENT_AMBULATORY_CARE_PROVIDER_SITE_OTHER): Payer: Medicare Other | Admitting: Internal Medicine

## 2017-08-20 VITALS — BP 132/66 | HR 67 | Ht 62.0 in | Wt 209.0 lb

## 2017-08-20 DIAGNOSIS — I4510 Unspecified right bundle-branch block: Secondary | ICD-10-CM

## 2017-08-20 DIAGNOSIS — R600 Localized edema: Secondary | ICD-10-CM

## 2017-08-20 DIAGNOSIS — I5031 Acute diastolic (congestive) heart failure: Secondary | ICD-10-CM

## 2017-08-20 DIAGNOSIS — R0602 Shortness of breath: Secondary | ICD-10-CM

## 2017-08-20 NOTE — Progress Notes (Signed)
Cardiology Office Note   Date:  08/20/2017   ID:  [facility], DOB [birth year], MRN [id number]  PCP:  Aurea Graff.Marlou Sa, MD   CC: Leg swelling, weight gain  History of Present Illness: [facility] is a 77 y.o. female who presents for  Four-month follow-up visit  This pleasant 77 year old woman is seen for a scheduled followup visit. The patient has a history of having had a stroke in early December 2014. She was at her [facility] at [facility] and they diagnosed her and sent her straight to neurology where she was hospitalized for 3 days. The MRI showed a new stroke on the right side. She had carotid Dopplers which did not show any obstructive lesions but she did have some plaque and they put her back on aspirin and continued her Plavix. The patient had a echocardiogram which was a bubble study and did not show any evidence of right-to-left shunt. More recently, she has had further neurology workup at [facility] on 06/20/14. She had a transcranial Doppler. 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 Previous carotid Dopplers had not shown any significant external cranial disease She has a history of essential hypertension, diabetes mellitus, and dyslipidemia. She has had atypical chest pain. She is intolerant of statin drugs. We updated her lexiscan Myoview stress test on 03/17/12 and it was normal showing no evidence of ischemia and her ejection fraction was 78%. The patient has never had a cardiac catheterization. She has continued to have some shortness of breath. She had an echocardiogram in 04/07/12 which showed an ejection fraction of 55-65% with grade 1 diastolic dysfunction. There is trivial aortic insufficiency.  The patient has a history of essential hypertension as well as atypical chest pain. Her diabetes is now being  [facility] at [facility]. Since we last saw her she had an ophthalmology evaluation on 07/27/14 by Dr. Kathrin Penner and no diabetic retinopathy was found. Her last chest x-ray was on 12/21/12 elsewhere and showed a normal heart size and low lung volumes. She had a recent CT scan of the chest on 03/23/13 at [facility] which showed normal heart size and demonstrated atherosclerosis of the aorta.  The patient has a past history of dermatofibrosarcoma of the spine. She had surgery for this at [facility]. She also has a past history of angiolipoma of the abdominal wall.  Since last visit she has not been experiencing any new cardiac symptoms. She does have sleep apnea. She uses a CPAP machine. She has had some low back pain and pain in her hip radiating to her knees. She has seen Dr. Durward Fortes. She was admitted to the [facility] on 01/18/15 for chest and abdominal pain. She had a CTA of the chest on 01/18/15 which was negative for pulmonary embolus. The following day she had a Myoview on 01/19/15 which showed no ischemia and her ejection fraction was 85%. She had a lot of side effects from the [facility]. It was felt in retrospect that her presenting complaints were probably GI in origin rather than cardiac. She does have a history of GERD and sees Dr. Oletta Lamas.  since last visit she has had no new cardiac symptoms.  She has not been having any recent chest discomfort. Her weight is unchanged and her blood pressures been stable.  11/22/2015  [facility] presents  today to establish care with me. She is a previous patient of Dr. Warren Danes. She [place] was at the beach and developed some worsening shortness of breath and leg swelling. Her nephew was there who came down from [place] with a head cold and she seems to have symptoms now at this time. She was also seen at an urgent care there and placed on amoxicillin. She's taken that for a few days. She notes  significant swelling in her weight is much higher than it had been in the past. She endorses a high salt intake. Her last echocardiogram was in 2013 which showed an EF of 55-65% with grade 1 diastolic dysfunction. She had a stress test in 2016 for chest pain which showed an EF of 85%.  12/25/2015  I saw [facility] that today in the office. She has been taking the Lasix 40 mg daily which was over a week and this resulted in significant diuresis. Her weight had reduced from 202 down to 189. She then had to stop it for a few days because of some other medical problems are currently weight is up to 194. Her BNP associate with this was very low at 8 and renal function appeared to be stable. She reports a 75% improvement in breathing. Her repeat echo shows that EF remains stable with grade 1 diastolic dysfunction.  03/13/2016  [facility] reports she's had a marked improvement in her swelling and breathing with additional diuretics. Weight has been fairly stable. She reports one episode of chest pain which was responsive to nitroglycerin. She's also had some reflux symptoms and is currently on Zegerid which appears to be helping her symptoms. Is not clear whether these episodes of chest discomfort are related to reflux or coronary ischemia. She did have a negative Myoview stress test in August 2016.  08/29/2016  [facility] was seen today in the office. Over the past several day she's had worsening shortness of breath and lower extremity swelling. Her weight is now up about 6 pounds from her previous office visit. She said over the auscultation is been taking Lasix more regularly. She's currently taking 40 mg daily but as previously taken that for 3 days and alternating with 20 mg. She notes some improvement today after a couple days of diuretics.  10/08/2016  [facility] returns today for follow-up. She has diuresed several pounds with increased dose diuretics are per creatinine rose and therefore I  decreased her diuretic back to 40 mg daily. She says she feels like she is over dried out on that dose of diuretic. She occasionally gets some cramping. She's also had some labile blood sugars which she attributes to the diuretics. In addition she had a recent bowel obstruction which may been related to diuresis.  03/06/2017  Perle was seen today in follow-up. She recently got back from a trip to assess catch one with her husband. She said they were traveling for about 16 days and she did not take her Lasix during that period of time. Her weight is up about 6 pounds that she reports lower extremity swelling. She has a sliding scale Lasix to take but has not been compliant with that.  08/20/2017  Tashaya returns today for follow-up.  She has been having a lot of symptoms recently.  She is complaining of pain across her abdomen.  This is been further assessed and there is a suggestion it could be related to back surgery.  She was also found to have mass  on the liver and pancreas, thought to be hemangioma.  She is scheduled to have a CT scan of her abdomen next week.  She continues to gain weight.  Her weight is up 6 pounds since I saw her in September.  Which was 203 at the time.  Her weight had been as low as 195 last year.  She does report lower extremity edema and some worsening shortness of breath however she relates the shortness of breath more to her pain.  She had been on higher dose Lasix in the past but was noted to have some elevation in creatinine.  We did reduce the dose and she has been compliant taking 20 mg daily.  Past Medical History:  Diagnosis Date  . Acute diastolic (congestive) heart failure ([place]) 12/25/2015  . Back pain    prior back surgery in 2009 - slow to recover  . Cancer ([place])   . Diabetes ([place])   . Diverticulitis   . Gastritis   . Hemangioma of liver    managed conservatively; followed at [facility] (hyperlipidemia)    statin intolerant  . Hypertension   . Normal  cardiac stress test 2011  . Obesity   . Thyroid disease    hypothyroidism    Past Surgical History:  Procedure Laterality Date  . ABDOMINAL HYSTERECTOMY    . APPENDECTOMY    . NASAL RECONSTRUCTION       Current Outpatient Medications  Medication Sig Dispense Refill  . aspirin 81 MG tablet Take 81 mg by mouth daily.    . bisoprolol-hydrochlorothiazide (ZIAC) 10-6.25 MG tablet TAKE (1) TABLET DAILY AS DIRECTED. 90 tablet 3  . clopidogrel (PLAVIX) 75 MG tablet TAKE 1 TABLET DAILY. 90 tablet 3  . colesevelam (WELCHOL) 625 MG tablet Take 3 tablets (1,875 mg total) by mouth 2 (two) times daily as needed (cholesterol). 180 tablet 6  . furosemide (LASIX) 20 MG tablet Take 20 mg by mouth daily.    Marland Kitchen gabapentin (NEURONTIN) 100 MG capsule Take 200 mg by mouth daily as needed (nerve pain).     Marland Kitchen glucose blood ([employer] BLOOD GLUCOSE TEST STRIPS) test strip Used to check Blood Sugar 4 times daily. Dx. Code 250.00    . HUMALOG KWIKPEN 100 UNIT/ML KiwkPen Use 9 at breakfast, 12 units at lunch and 14 at supper + sliding scale    . hyoscyamine (LEVBID) 0.375 MG 12 hr tablet Take 0.375 mg by mouth every 12 (twelve) hours as needed (cramps).     . insulin glargine (LANTUS) 100 UNIT/ML injection Inject 26 Units into the skin every morning.     . Lancets (ACCU-CHEK MULTICLIX) lancets Used to check Blood Sugar 4 times daily. Dx. Code 250.00.    Marland Kitchen levothyroxine (SYNTHROID, LEVOTHROID) 125 MCG tablet Take 125 mcg by mouth daily.    Marland Kitchen losartan (COZAAR) 25 MG tablet TAKE 1 TABLET ONCE DAILY. 90 tablet 3  . nitroGLYCERIN (NITROSTAT) 0.4 MG SL tablet DISSOLVE ONE TABLET UNDER TONGUE AS NEEDED FOR ARM/CHEST PAIN. 25 tablet PRN  . pantoprazole (PROTONIX) 40 MG tablet Take 1 tablet by mouth daily.    . potassium chloride SA (K-DUR,KLOR-CON) 20 MEQ tablet TAKE (2) TABLETS BY MOUTH DAILY. 180 tablet 0   No current facility-administered medications for this visit.     Allergies:   Celecoxib; Ropinirole hcl; Actos  [pioglitazone hydrochloride]; Crestor [rosuvastatin calcium]; Doxycycline; Fenofibrate; Imdur [isosorbide nitrate]; Lipitor [atorvastatin calcium]; Metformin and related; Niaspan [niacin er]; Pioglitazone; Pravachol; Ranolazine er; Rosuvastatin; and Zetia [  ezetimibe]    Social History:  The patient  reports that  has never smoked. she has never used smokeless tobacco. She reports that she does not drink alcohol or use drugs.   Family History:  The patient's family history includes Dementia in her father; Heart attack in her brother and father; Heart disease in her brother and father; Hypertension in her mother; Stroke in her mother.    ROS:   Pertinent items noted in HPI and remainder of comprehensive ROS otherwise negative.  PHYSICAL EXAM: VS:  BP 132/66   Pulse 67   Ht 5\' 2"  (1.575 m)   Wt 209 lb (94.8 kg)   BMI 38.23 kg/m  , BMI Body mass index is 38.23 kg/m. General appearance: alert, no distress and moderately obese Neck: no carotid bruit and no JVD Lungs: diminished breath sounds bilaterally Heart: regular rate and rhythm Abdomen: soft, non-tender; bowel sounds normal; no masses,  no organomegaly Extremities: edema 1+ pedal edema Pulses: 2+ and symmetric Skin: Skin color, texture, turgor normal. No rashes or lesions Neurologic: Grossly normal Psych: Mildly anxious  EKG:   Normal sinus rhythm at 67, RBBB-personally reviewed  Recent Labs: 08/29/2016: Brain Natriuretic Peptide 45.7; BUN 19; Creat 0.77; Potassium 3.9; Sodium 140    Lipid Panel    Component Value Date/Time   CHOL 209 (H) 11/15/2015 0954   TRIG 102 11/15/2015 0954   HDL 55 11/15/2015 0954   CHOLHDL 3.8 11/15/2015 0954   VLDL 20 11/15/2015 0954   LDLCALC 134 (H) 11/15/2015 0954   LDLDIRECT 148.7 09/14/2012 0857      Wt Readings from Last 3 Encounters:  08/20/17 209 lb (94.8 kg)  03/06/17 203 lb 9.6 oz (92.4 kg)  10/08/16 196 lb 12.8 oz (89.3 kg)     ASSESSMENT AND PLAN:  1. Acute diastolic  congestive heart failure 2. Hypertensive heart disease without heart failure 3. Diabetes mellitus - on insulin 4. Atypical chest pain with normal Myoview stress test in August 2016. No ischemia. Ejection fraction 85%. 64. old CVA followed at [facility] 6. History of dermatofibro- sarcoma of the spine. 7. Dyslipidemia 8. Osteoarthritis 9. OSA on CPAP [facility]) 10. Hypothyroidism [facility] at [facility]. 11. GERD, followed by Dr. Oletta Lamas  12. RBBB   PLAN:  Mrs. Gluth has had continued weight gain, shortness of breath and lower extremity swelling.  Some of this she attributes to adjustments in her thyroid medication.  She had recent dose reduction.  She reports compliance with her Lasix.  She looks like she would benefit from some additional Lasix.  I advised her to take 40 mg daily for 3 days and monitor her weight.  I suspect her dry weight is somewhere around 200 pounds but may be higher.  She should then go back to her tube 20 mg daily Lasix dosing.  She does report some dietary noncompliance recently.  She intermittently wears compression stockings.  We will also provide her with a sliding scale Lasix instructions.  Follow-up in 6 months.  Pixie Casino, MD, [facility] [place] Director of the [facility] Diplomate of the American Board of Clinical Lipidology Attending Cardiologist  Direct Dial: [phone]  Fax: [phone]  Website:  [email or web address]   08/20/2017 9:13 AM

## 2017-08-20 NOTE — Patient Instructions (Signed)
INCREASE lasix 40mg  daily for THREE DAYS then decrease to 20mg  daily   Sliding scale Lasix:  Weigh yourself when you get home, then daily in the morning.  GOAL WEIGHT - 200lbs Weight here in office today is 209 lbs  If you gain more than 3 pounds from dry weight: increase the lasix dosing to 20 mg in the morning and 20 mg in the afternoon until weight returns to baseline dry weight.  If weight gain is greater than 5 pounds in 2 days: increase to lasix 40 mg in the morning and 20mg  in the afternoon and contact the office for further assistance if weight does not go down the next day.  Your physician wants you to follow-up in: 6 months with Dr. Debara Pickett. You will receive a reminder letter in the mail two months in advance. If you don't receive a letter, please call our office to schedule the follow-up appointment.

## 2017-09-01 ENCOUNTER — Ambulatory Visit
Admission: RE | Admit: 2017-09-01 | Discharge: 2017-09-01 | Disposition: A | Discharge status: Home / Self Care | Payer: Medicare Other | Source: Ambulatory Visit | Attending: Gastroenterology | Admitting: Gastroenterology

## 2017-09-01 DIAGNOSIS — R1011 Right upper quadrant pain: Secondary | ICD-10-CM

## 2017-09-01 DIAGNOSIS — D1803 Hemangioma of intra-abdominal structures: Secondary | ICD-10-CM | POA: Diagnosis not present

## 2017-09-01 MED ORDER — IOPAMIDOL (ISOVUE-300) INJECTION 61%
100.0000 mL | Freq: Once | INTRAVENOUS | Status: AC | PRN
  Administered 2017-09-01: 100 mL via INTRAVENOUS

## 2017-09-03 DIAGNOSIS — D1803 Hemangioma of intra-abdominal structures: Secondary | ICD-10-CM | POA: Diagnosis not present

## 2017-09-03 DIAGNOSIS — K869 Disease of pancreas, unspecified: Secondary | ICD-10-CM | POA: Diagnosis not present

## 2017-09-03 DIAGNOSIS — I679 Cerebrovascular disease, unspecified: Secondary | ICD-10-CM | POA: Diagnosis not present

## 2017-09-03 DIAGNOSIS — Z86010 Personal history of colonic polyps: Secondary | ICD-10-CM | POA: Diagnosis not present

## 2017-09-03 DIAGNOSIS — E119 Type 2 diabetes mellitus without complications: Secondary | ICD-10-CM | POA: Diagnosis not present

## 2017-09-03 DIAGNOSIS — K219 Gastro-esophageal reflux disease without esophagitis: Secondary | ICD-10-CM | POA: Diagnosis not present

## 2017-09-03 DIAGNOSIS — R1011 Right upper quadrant pain: Secondary | ICD-10-CM | POA: Diagnosis not present

## 2017-09-04 ENCOUNTER — Other Ambulatory Visit: Payer: Self-pay | Admitting: Internal Medicine

## 2017-09-05 NOTE — Telephone Encounter (Signed)
REFILL 

## 2017-09-27 DIAGNOSIS — M5114 Intervertebral disc disorders with radiculopathy, thoracic region: Secondary | ICD-10-CM | POA: Diagnosis not present

## 2017-09-30 DIAGNOSIS — H8111 Benign paroxysmal vertigo, right ear: Secondary | ICD-10-CM | POA: Diagnosis not present

## 2017-09-30 DIAGNOSIS — Z79899 Other long term (current) drug therapy: Secondary | ICD-10-CM | POA: Diagnosis not present

## 2017-09-30 DIAGNOSIS — M5414 Radiculopathy, thoracic region: Secondary | ICD-10-CM | POA: Diagnosis not present

## 2017-09-30 DIAGNOSIS — Z9989 Dependence on other enabling machines and devices: Secondary | ICD-10-CM | POA: Diagnosis not present

## 2017-09-30 DIAGNOSIS — G4733 Obstructive sleep apnea (adult) (pediatric): Secondary | ICD-10-CM | POA: Diagnosis not present

## 2017-09-30 DIAGNOSIS — Z8673 Personal history of transient ischemic attack (TIA), and cerebral infarction without residual deficits: Secondary | ICD-10-CM | POA: Diagnosis not present

## 2017-09-30 DIAGNOSIS — I6601 Occlusion and stenosis of right middle cerebral artery: Secondary | ICD-10-CM | POA: Diagnosis not present

## 2017-10-14 DIAGNOSIS — D171 Benign lipomatous neoplasm of skin and subcutaneous tissue of trunk: Secondary | ICD-10-CM | POA: Diagnosis not present

## 2017-10-14 DIAGNOSIS — C44722 Squamous cell carcinoma of skin of right lower limb, including hip: Secondary | ICD-10-CM | POA: Diagnosis not present

## 2017-10-14 DIAGNOSIS — L821 Other seborrheic keratosis: Secondary | ICD-10-CM | POA: Diagnosis not present

## 2017-10-14 DIAGNOSIS — R222 Localized swelling, mass and lump, trunk: Secondary | ICD-10-CM | POA: Diagnosis not present

## 2017-10-14 DIAGNOSIS — C4499 Other specified malignant neoplasm of skin, unspecified: Secondary | ICD-10-CM | POA: Diagnosis not present

## 2017-10-14 DIAGNOSIS — Z86018 Personal history of other benign neoplasm: Secondary | ICD-10-CM | POA: Diagnosis not present

## 2017-10-14 DIAGNOSIS — Z85828 Personal history of other malignant neoplasm of skin: Secondary | ICD-10-CM | POA: Diagnosis not present

## 2017-10-16 ENCOUNTER — Other Ambulatory Visit: Payer: Self-pay | Admitting: Internal Medicine

## 2017-10-31 DIAGNOSIS — E039 Hypothyroidism, unspecified: Secondary | ICD-10-CM | POA: Diagnosis not present

## 2017-10-31 DIAGNOSIS — I10 Essential (primary) hypertension: Secondary | ICD-10-CM | POA: Diagnosis not present

## 2017-10-31 DIAGNOSIS — Z9889 Other specified postprocedural states: Secondary | ICD-10-CM | POA: Diagnosis not present

## 2017-10-31 DIAGNOSIS — M5414 Radiculopathy, thoracic region: Secondary | ICD-10-CM | POA: Diagnosis not present

## 2017-10-31 DIAGNOSIS — M1990 Unspecified osteoarthritis, unspecified site: Secondary | ICD-10-CM | POA: Diagnosis not present

## 2017-10-31 DIAGNOSIS — E119 Type 2 diabetes mellitus without complications: Secondary | ICD-10-CM | POA: Diagnosis not present

## 2017-10-31 DIAGNOSIS — Z79899 Other long term (current) drug therapy: Secondary | ICD-10-CM | POA: Diagnosis not present

## 2017-10-31 DIAGNOSIS — K219 Gastro-esophageal reflux disease without esophagitis: Secondary | ICD-10-CM | POA: Diagnosis not present

## 2017-11-01 ENCOUNTER — Other Ambulatory Visit: Payer: Self-pay | Admitting: Internal Medicine

## 2017-11-04 DIAGNOSIS — E113293 Type 2 diabetes mellitus with mild nonproliferative diabetic retinopathy without macular edema, bilateral: Secondary | ICD-10-CM | POA: Diagnosis not present

## 2017-11-04 DIAGNOSIS — H35371 Puckering of macula, right eye: Secondary | ICD-10-CM | POA: Diagnosis not present

## 2017-11-04 DIAGNOSIS — H43813 Vitreous degeneration, bilateral: Secondary | ICD-10-CM | POA: Diagnosis not present

## 2017-11-04 NOTE — Telephone Encounter (Signed)
Rx sent to pharmacy   

## 2017-11-05 DIAGNOSIS — E119 Type 2 diabetes mellitus without complications: Secondary | ICD-10-CM | POA: Diagnosis not present

## 2017-11-05 DIAGNOSIS — K219 Gastro-esophageal reflux disease without esophagitis: Secondary | ICD-10-CM | POA: Diagnosis not present

## 2017-11-05 DIAGNOSIS — Z7984 Long term (current) use of oral hypoglycemic drugs: Secondary | ICD-10-CM | POA: Diagnosis not present

## 2017-11-05 DIAGNOSIS — C4499 Other specified malignant neoplasm of skin, unspecified: Secondary | ICD-10-CM | POA: Diagnosis not present

## 2017-11-05 DIAGNOSIS — K869 Disease of pancreas, unspecified: Secondary | ICD-10-CM | POA: Diagnosis not present

## 2017-11-05 DIAGNOSIS — M546 Pain in thoracic spine: Secondary | ICD-10-CM | POA: Diagnosis not present

## 2017-11-05 DIAGNOSIS — D1803 Hemangioma of intra-abdominal structures: Secondary | ICD-10-CM | POA: Diagnosis not present

## 2017-11-09 DIAGNOSIS — L089 Local infection of the skin and subcutaneous tissue, unspecified: Secondary | ICD-10-CM | POA: Diagnosis not present

## 2017-11-09 DIAGNOSIS — K529 Noninfective gastroenteritis and colitis, unspecified: Secondary | ICD-10-CM | POA: Diagnosis not present

## 2017-11-15 ENCOUNTER — Other Ambulatory Visit: Payer: Self-pay | Admitting: Internal Medicine

## 2017-11-17 NOTE — Telephone Encounter (Signed)
Rx(s) sent to pharmacy electronically.  

## 2017-11-19 DIAGNOSIS — L821 Other seborrheic keratosis: Secondary | ICD-10-CM | POA: Diagnosis not present

## 2017-11-19 DIAGNOSIS — R238 Other skin changes: Secondary | ICD-10-CM | POA: Diagnosis not present

## 2017-11-19 DIAGNOSIS — R222 Localized swelling, mass and lump, trunk: Secondary | ICD-10-CM | POA: Diagnosis not present

## 2017-11-19 DIAGNOSIS — Z483 Aftercare following surgery for neoplasm: Secondary | ICD-10-CM | POA: Diagnosis not present

## 2017-11-19 DIAGNOSIS — D1722 Benign lipomatous neoplasm of skin and subcutaneous tissue of left arm: Secondary | ICD-10-CM | POA: Diagnosis not present

## 2017-11-19 DIAGNOSIS — N6459 Other signs and symptoms in breast: Secondary | ICD-10-CM | POA: Diagnosis not present

## 2017-11-19 DIAGNOSIS — C44722 Squamous cell carcinoma of skin of right lower limb, including hip: Secondary | ICD-10-CM | POA: Diagnosis not present

## 2017-11-19 DIAGNOSIS — Z4889 Encounter for other specified surgical aftercare: Secondary | ICD-10-CM | POA: Diagnosis not present

## 2017-12-16 DIAGNOSIS — Z6837 Body mass index (BMI) 37.0-37.9, adult: Secondary | ICD-10-CM | POA: Diagnosis not present

## 2017-12-16 DIAGNOSIS — Z01419 Encounter for gynecological examination (general) (routine) without abnormal findings: Secondary | ICD-10-CM | POA: Diagnosis not present

## 2017-12-16 DIAGNOSIS — Z1231 Encounter for screening mammogram for malignant neoplasm of breast: Secondary | ICD-10-CM | POA: Diagnosis not present

## 2017-12-16 DIAGNOSIS — N3281 Overactive bladder: Secondary | ICD-10-CM | POA: Diagnosis not present

## 2017-12-30 DIAGNOSIS — D1803 Hemangioma of intra-abdominal structures: Secondary | ICD-10-CM | POA: Diagnosis not present

## 2017-12-30 DIAGNOSIS — D3A8 Other benign neuroendocrine tumors: Secondary | ICD-10-CM | POA: Diagnosis not present

## 2017-12-30 DIAGNOSIS — C7A8 Other malignant neuroendocrine tumors: Secondary | ICD-10-CM | POA: Diagnosis not present

## 2018-01-05 DIAGNOSIS — E119 Type 2 diabetes mellitus without complications: Secondary | ICD-10-CM | POA: Diagnosis not present

## 2018-01-05 DIAGNOSIS — Z79899 Other long term (current) drug therapy: Secondary | ICD-10-CM | POA: Diagnosis not present

## 2018-01-05 DIAGNOSIS — E039 Hypothyroidism, unspecified: Secondary | ICD-10-CM | POA: Diagnosis not present

## 2018-01-05 DIAGNOSIS — Z0000 Encounter for general adult medical examination without abnormal findings: Secondary | ICD-10-CM | POA: Diagnosis not present

## 2018-01-05 DIAGNOSIS — I10 Essential (primary) hypertension: Secondary | ICD-10-CM | POA: Diagnosis not present

## 2018-01-05 DIAGNOSIS — E785 Hyperlipidemia, unspecified: Secondary | ICD-10-CM | POA: Diagnosis not present

## 2018-01-05 DIAGNOSIS — E1165 Type 2 diabetes mellitus with hyperglycemia: Secondary | ICD-10-CM | POA: Diagnosis not present

## 2018-01-05 DIAGNOSIS — N951 Menopausal and female climacteric states: Secondary | ICD-10-CM | POA: Diagnosis not present

## 2018-01-05 DIAGNOSIS — Z794 Long term (current) use of insulin: Secondary | ICD-10-CM | POA: Diagnosis not present

## 2018-01-05 DIAGNOSIS — M546 Pain in thoracic spine: Secondary | ICD-10-CM | POA: Diagnosis not present

## 2018-02-02 ENCOUNTER — Other Ambulatory Visit: Payer: Self-pay | Admitting: Internal Medicine

## 2018-02-03 ENCOUNTER — Telehealth: Payer: Self-pay | Admitting: Internal Medicine

## 2018-02-03 NOTE — Telephone Encounter (Signed)
Spoke with pt. Pt sts that she has had 2 episodes of chest pain. Starting on 02/02/18. The 1st on 8/26 was rated a 5-6 on a 1-10 pain scale. Pt she felt clammy but denied n/v. Pain did not radiate. Pain was not relieved with rest. Chest pain did not radiate  The 2nd episode occurred last night . Pt reports the pain was a 9-10 out of 10. She was in bed at the time. She took 1 Nitro with no relief. She did not call EMS.She then took an antacid and 2 Ibuprofen the pain eventually subsided.  No chest pain today, no n/v, no diaphoresis. She does report that her chest feels sore pain 1-2. She always has exertional sob but this is stable. She denies swelling.   She is scheduled to have bone density test later today. Adv pt that if chest pain returns that is not relieved with rest and Nitroglycerin she should seek emergency care.  Appt with Dr.Hilty moved up to 02/04/18 @ 10:45am (ok to schedule per Jenna,RN). Pt aware of appt.  Pt verbalized understanding to instruction given and voiced appreciation for the call.

## 2018-02-03 NOTE — Telephone Encounter (Signed)
° ° °  Chest pain started 02/02/18  1. Are you having CP right now? NO, complains of some soreness  2. Are you experiencing any other symptoms (ex. SOB, nausea, vomiting, sweating)? SOB, nausea   3. How long have you been experiencing CP?   4. Is your CP continuous or coming and going? Pain started on 8/26 continuous, then went away  5. Have you taken Nitroglycerin? YES ?

## 2018-02-04 ENCOUNTER — Encounter: Payer: Self-pay | Admitting: Internal Medicine

## 2018-02-04 ENCOUNTER — Ambulatory Visit (INDEPENDENT_AMBULATORY_CARE_PROVIDER_SITE_OTHER): Payer: Medicare Other | Admitting: Internal Medicine

## 2018-02-04 VITALS — BP 136/67 | HR 71 | Ht 63.0 in | Wt 203.0 lb

## 2018-02-04 DIAGNOSIS — I200 Unstable angina: Secondary | ICD-10-CM | POA: Diagnosis not present

## 2018-02-04 DIAGNOSIS — R0600 Dyspnea, unspecified: Secondary | ICD-10-CM | POA: Diagnosis not present

## 2018-02-04 NOTE — Patient Instructions (Signed)
Dr. Debara Pickett has recommended a heart cath. Please call our office to schedule this procedure. Our nurses are available to assist you with scheduling this Monday-Friday from 8am-5pm. This procedure is done at [facility].

## 2018-02-04 NOTE — H&P (View-Only) (Signed)
Cardiology Office Note   Date:  02/04/2018   ID:  Whitney Newton, DOB Jul 25, 1940, MRN [id number]  PCP:  Whitney Graff.Marlou Sa, MD   CC: Chest pressure, shortness of breath  History of Present Illness: Whitney Newton is a 77 y.o. female who presents for  Four-month follow-up visit  This pleasant 77 year old woman is seen for a scheduled followup visit. The patient has a history of having had a stroke in early December 2014. She was at her diabetes clinic at [facility] and they diagnosed her and sent her straight to neurology where she was hospitalized for 3 days. The MRI showed a new stroke on the right side. She had carotid Dopplers which did not show any obstructive lesions but she did have some plaque and they put her back on aspirin and continued her Plavix. The patient had a echocardiogram which was a bubble study and did not show any evidence of right-to-left shunt. More recently, she has had further neurology workup at [facility] on 06/20/14. She had a transcranial Doppler. 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 Previous carotid Dopplers had not shown any significant external cranial disease She has a history of essential hypertension, diabetes mellitus, and dyslipidemia. She has had atypical chest pain. She is intolerant of statin drugs. We updated her lexiscan Myoview stress test on 03/17/12 and it was normal showing no evidence of ischemia and her ejection fraction was 78%. The patient has never had a cardiac catheterization. She has continued to have some shortness of breath. She had an echocardiogram in 04/07/12 which showed an ejection fraction of 55-65% with grade 1 diastolic dysfunction. There is trivial aortic insufficiency.  The patient has a history of essential hypertension as well as atypical chest pain. Her diabetes is now  being followed at the [facility] clinic at [facility]. Since we last saw her she had an ophthalmology evaluation on 07/27/14 by Dr. Kathrin Penner and no diabetic retinopathy was found. Her last chest x-ray was on 12/21/12 elsewhere and showed a normal heart size and low lung volumes. She had a recent CT scan of the chest on 03/23/13 at [facility] which showed normal heart size and demonstrated atherosclerosis of the aorta.  The patient has a past history of dermatofibrosarcoma of the spine. She had surgery for this at [facility]. She also has a past history of angiolipoma of the abdominal wall.  Since last visit she has not been experiencing any new cardiac symptoms. She does have sleep apnea. She uses a CPAP machine. She has had some low back pain and pain in her hip radiating to her knees. She has seen Dr. Durward Fortes. She was admitted to the hospital on 01/18/15 for chest and abdominal pain. She had a CTA of the chest on 01/18/15 which was negative for pulmonary embolus. The following day she had a Myoview on 01/19/15 which showed no ischemia and her ejection fraction was 85%. She had a lot of side effects from the [facility]. It was felt in retrospect that her presenting complaints were probably GI in origin rather than cardiac. She does have a history of GERD and sees Dr. Oletta Lamas.  since last visit she has had no new cardiac symptoms.  She has not been having any recent chest discomfort. Her weight is unchanged and her blood pressures been stable.  11/22/2015  Whitney Newton  presents today to establish care with me. She is a previous patient of Dr. Warren Danes. She recently was at the beach and developed some worsening shortness of breath and leg swelling. Her nephew was there who came down from [place] with a head cold and she seems to have symptoms now at this time. She was also seen at an urgent care there and placed on amoxicillin. She's taken that for a few days. She  notes significant swelling in her weight is much higher than it had been in the past. She endorses a high salt intake. Her last echocardiogram was in 2013 which showed an EF of 55-65% with grade 1 diastolic dysfunction. She had a stress test in 2016 for chest pain which showed an EF of 85%.  12/25/2015  I saw Whitney Newton that today in the office. She has been taking the Lasix 40 mg daily which was over a week and this resulted in significant diuresis. Her weight had reduced from 202 down to 189. She then had to stop it for a few days because of some other medical problems are currently weight is up to 194. Her BNP associate with this was very low at 8 and renal function appeared to be stable. She reports a 75% improvement in breathing. Her repeat echo shows that EF remains stable with grade 1 diastolic dysfunction.  03/13/2016  Whitney Newton reports she's had a marked improvement in her swelling and breathing with additional diuretics. Weight has been fairly stable. She reports one episode of chest pain which was responsive to nitroglycerin. She's also had some reflux symptoms and is currently on Zegerid which appears to be helping her symptoms. Is not clear whether these episodes of chest discomfort are related to reflux or coronary ischemia. She did have a negative Myoview stress test in August 2016.  08/29/2016  Whitney Newton was seen today in the office. Over the past several day she's had worsening shortness of breath and lower extremity swelling. Her weight is now up about 6 pounds from her previous office visit. She said over the auscultation is been taking Lasix more regularly. She's currently taking 40 mg daily but as previously taken that for 3 days and alternating with 20 mg. She notes some improvement today after a couple days of diuretics.  10/08/2016  Whitney Newton returns today for follow-up. She has diuresed several pounds with increased dose diuretics are per creatinine rose and therefore I  decreased her diuretic back to 40 mg daily. She says she feels like she is over dried out on that dose of diuretic. She occasionally gets some cramping. She's also had some labile blood sugars which she attributes to the diuretics. In addition she had a recent bowel obstruction which may been related to diuresis.  03/06/2017  Whitney Newton was seen today in follow-up. She recently got back from a trip to assess catch one with her husband. She said they were traveling for about 16 days and she did not take her Lasix during that period of time. Her weight is up about 6 pounds that she reports lower extremity swelling. She has a sliding scale Lasix to take but has not been compliant with that.  08/20/2017  Whitney Newton returns today for follow-up.  She has been having a lot of symptoms recently.  She is complaining of pain across her abdomen.  This is been further assessed and there is a suggestion it could be related to back surgery.  She was also found to have  mass on the liver and pancreas, thought to be hemangioma.  She is scheduled to have a CT scan of her abdomen next week.  She continues to gain weight.  Her weight is up 6 pounds since I saw her in September.  Which was 203 at the time.  Her weight had been as low as 195 last year.  She does report lower extremity edema and some worsening shortness of breath however she relates the shortness of breath more to her pain.  She had been on higher dose Lasix in the past but was noted to have some elevation in creatinine.  We did reduce the dose and she has been compliant taking 20 mg daily.  02/05/2018  Whitney Newton returns for follow-up today.  Again she has numerous complaints however she is recently been having worsening chest discomfort.  She describes it as a squeezing or pressure in the center of her chest sometimes radiates up her neck.  May be associated with worsening shortness of breath.  Despite this she is taken extra Lasix and her weight is come down to her baseline of  203.  She has not had symptomatic improvement.  She was scheduled to see me in a few weeks but got an earlier appointment because of her chest discomfort.  Sometimes her symptoms are associated with exertion and relieved by rest at other times can be present at rest or when awakening.  Past Medical History:  Diagnosis Date  . Acute diastolic (congestive) heart failure ([place]) 12/25/2015  . Back pain    prior back surgery in 2009 - slow to recover  . Cancer ([place])   . Diabetes ([place])   . Diverticulitis   . Gastritis   . Hemangioma of liver    managed conservatively; followed at [facility] (hyperlipidemia)    statin intolerant  . Hypertension   . Normal cardiac stress test 2011  . Obesity   . Thyroid disease    hypothyroidism    Past Surgical History:  Procedure Laterality Date  . ABDOMINAL HYSTERECTOMY    . APPENDECTOMY    . NASAL RECONSTRUCTION       Current Outpatient Medications  Medication Sig Dispense Refill  . aspirin 81 MG tablet Take 81 mg by mouth daily.    . bisoprolol-hydrochlorothiazide (ZIAC) 10-6.25 MG tablet Take 1 tablet by mouth daily. KEEP OV. 90 tablet 0  . clopidogrel (PLAVIX) 75 MG tablet TAKE 1 TABLET BY MOUTH DAILY. 90 tablet 3  . colesevelam (WELCHOL) 625 MG tablet Take 3 tablets (1,875 mg total) by mouth 2 (two) times daily as needed (cholesterol). 180 tablet 6  . furosemide (LASIX) 20 MG tablet Take 20 mg by mouth daily.    . furosemide (LASIX) 40 MG tablet TAKE 1 TABLET ONCE DAILY. 90 tablet 1  . gabapentin (NEURONTIN) 100 MG capsule Take 200 mg by mouth daily as needed (nerve pain).     Marland Kitchen glucose blood ([employer] BLOOD GLUCOSE TEST STRIPS) test strip Used to check Blood Sugar 4 times daily. Dx. Code 250.00    . HUMALOG KWIKPEN 100 UNIT/ML KiwkPen Use 9 at breakfast, 12 units at lunch and 14 at supper + sliding scale    . hyoscyamine (LEVBID) 0.375 MG 12 hr tablet Take 0.375 mg by mouth every 12 (twelve) hours as needed (cramps).     . insulin glargine  (LANTUS) 100 UNIT/ML injection Inject 26 Units into the skin every morning.     . Lancets (ACCU-CHEK MULTICLIX) lancets Used to  check Blood Sugar 4 times daily. Dx. Code 250.00.    Marland Kitchen levothyroxine (SYNTHROID, LEVOTHROID) 125 MCG tablet Take 88 mcg by mouth daily.     Marland Kitchen losartan (COZAAR) 25 MG tablet Take 1 tablet (25 mg total) by mouth daily. 90 tablet 2  . nitroGLYCERIN (NITROSTAT) 0.4 MG SL tablet DISSOLVE ONE TABLET UNDER TONGUE AS NEEDED FOR ARM/CHEST PAIN. 25 tablet PRN  . potassium chloride Newton (K-DUR,KLOR-CON) 20 MEQ tablet TAKE (2) TABLETS BY MOUTH DAILY. 180 tablet 0  . raNITIdine HCl (ZANTAC PO) Take by mouth daily.     No current facility-administered medications for this visit.     Allergies:   Celecoxib; Ropinirole hcl; Actos [pioglitazone hydrochloride]; Crestor [rosuvastatin calcium]; Doxycycline; Fenofibrate; Imdur [isosorbide nitrate]; Lipitor [atorvastatin calcium]; Metformin and related; Niaspan [niacin er]; Pioglitazone; Pravachol; Ranolazine er; Rosuvastatin; and Zetia [ezetimibe]    Social History:  The patient  reports that she has never smoked. She has never used smokeless tobacco. She reports that she does not drink alcohol or use drugs.   Family History:  The patient's family history includes Dementia in her father; Heart attack in her brother and father; Heart disease in her brother and father; Hypertension in her mother; Stroke in her mother.    ROS:   Pertinent items noted in HPI and remainder of comprehensive ROS otherwise negative.  PHYSICAL EXAM: VS:  Ht 5\' 3"  (1.6 m)   Wt 203 lb (92.1 kg)   BMI 35.96 kg/m  , BMI Body mass index is 35.96 kg/m. General appearance: alert, no distress and moderately obese Neck: no carotid bruit and no JVD Lungs: diminished breath sounds bilaterally Heart: regular rate and rhythm Abdomen: soft, non-tender; bowel sounds normal; no masses,  no organomegaly Extremities: edema 1+ pedal edema Pulses: 2+ and symmetric Skin:  Skin color, texture, turgor normal. No rashes or lesions Neurologic: Grossly normal Psych: Mildly anxious  EKG:   Sinus rhythm 69, incomplete right bundle branch block, poor R wave progression anteriorly-personally reviewed  Recent Labs: No results found for requested labs within last 8760 hours.    Lipid Panel    Component Value Date/Time   CHOL 209 (H) 11/15/2015 0954   TRIG 102 11/15/2015 0954   HDL 55 11/15/2015 0954   CHOLHDL 3.8 11/15/2015 0954   VLDL 20 11/15/2015 0954   LDLCALC 134 (H) 11/15/2015 0954   LDLDIRECT 148.7 09/14/2012 0857      Wt Readings from Last 3 Encounters:  02/04/18 203 lb (92.1 kg)  08/20/17 209 lb (94.8 kg)  03/06/17 203 lb 9.6 oz (92.4 kg)     ASSESSMENT AND PLAN:  1. Acute diastolic congestive heart failure 2. Hypertensive heart disease without heart failure 3. Diabetes mellitus - on insulin 4. Atypical chest pain with normal Myoview stress test in August 2016. No ischemia. Ejection fraction 85%. 65. old CVA followed at [facility] 6. History of dermatofibro- sarcoma of the spine. 7. Dyslipidemia 8. Osteoarthritis 9. OSA on CPAP [facility]) 10. Hypothyroidism followed at [facility]. 11. GERD, followed by Dr. Oletta Lamas  12. RBBB   PLAN:  Whitney Newton is describing chest pressure which has been worsening recently with shortness of breath.  She treated herself with extra Lasix, assuming possible acute on chronic diastolic congestive heart failure however despite weight loss and diuresis, she continues to be symptomatic.  She appears euvolemic on exam today.  Her last stress test was a Myoview in 2016 which was negative for ischemia.  She is never had a definitive heart  catheterization.  Given her ongoing chest discomfort, I would recommend a left heart catheterization.  I discussed the risk, benefits and alternatives with her and her husband today and there was to proceed.  We will try to schedule that in the near  future.  Follow-up with me afterwards.  Pixie Casino, MD, [facility], [place] Director of the Advanced Lipid Disorders &  Cardiovascular Risk Reduction Clinic Diplomate of the American Board of Clinical Lipidology Attending Cardiologist  Direct Dial: [phone]  Fax: [phone]  Website:  [email or web address]   02/04/2018 11:18 AM

## 2018-02-04 NOTE — Progress Notes (Signed)
Cardiology Office Note   Date:  02/04/2018   ID:  Whitney Newton, DOB 10/01/1940, MRN [id number]  PCP:  Whitney Graff.Whitney Sa, MD   CC: Chest pressure, shortness of breath  History of Present Illness: Whitney Newton is a 77 y.o. female who presents for  Four-month follow-up visit  This pleasant 77 year old woman is seen for a scheduled followup visit. The patient has a history of having had a stroke in early December 2014. She was at her diabetes clinic at [facility] and they diagnosed her and sent her straight to neurology where she was hospitalized for 3 days. The MRI showed a new stroke on the right side. She had carotid Dopplers which did not show any obstructive lesions but she did have some plaque and they put her back on aspirin and continued her Plavix. The patient had a echocardiogram which was a bubble study and did not show any evidence of right-to-left shunt. More recently, she has had further neurology workup at [facility] on 06/20/14. She had a transcranial Doppler. 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 Previous carotid Dopplers had not shown any significant external cranial disease She has a history of essential hypertension, diabetes mellitus, and dyslipidemia. She has had atypical chest pain. She is intolerant of statin drugs. We updated her lexiscan Myoview stress test on 03/17/12 and it was normal showing no evidence of ischemia and her ejection fraction was 78%. The patient has never had a cardiac catheterization. She has continued to have some shortness of breath. She had an echocardiogram in 04/07/12 which showed an ejection fraction of 55-65% with grade 1 diastolic dysfunction. There is trivial aortic insufficiency.  The patient has a history of essential hypertension as well as atypical chest pain. Her diabetes is now  being followed at the [facility] clinic at [facility]. Since we last saw her she had an ophthalmology evaluation on 07/27/14 by Dr. Kathrin Newton and no diabetic retinopathy was found. Her last chest x-ray was on 12/21/12 elsewhere and showed a normal heart size and low lung volumes. She had a recent CT scan of the chest on 03/23/13 at [facility] which showed normal heart size and demonstrated atherosclerosis of the aorta.  The patient has a past history of dermatofibrosarcoma of the spine. She had surgery for this at [facility]. She also has a past history of angiolipoma of the abdominal wall.  Since last visit she has not been experiencing any new cardiac symptoms. She does have sleep apnea. She uses a CPAP machine. She has had some low back pain and pain in her hip radiating to her knees. She has seen Dr. Durward Newton. She was admitted to the hospital on 01/18/15 for chest and abdominal pain. She had a CTA of the chest on 01/18/15 which was negative for pulmonary embolus. The following day she had a Myoview on 01/19/15 which showed no ischemia and her ejection fraction was 85%. She had a lot of side effects from the [facility]. It was felt in retrospect that her presenting complaints were probably GI in origin rather than cardiac. She does have a history of GERD and sees Dr. Oletta Newton.  since last visit she has had no new cardiac symptoms.  She has not been having any recent chest discomfort. Her weight is unchanged and her blood pressures been stable.  11/22/2015  Whitney Newton  presents today to establish care with me. She is a previous patient of Dr. Warren Danes. She recently was at the beach and developed some worsening shortness of breath and leg swelling. Her nephew was there who came down from [place] with a head cold and she seems to have symptoms now at this time. She was also seen at an urgent care there and placed on amoxicillin. She's taken that for a few days. She  notes significant swelling in her weight is much higher than it had been in the past. She endorses a high salt intake. Her last echocardiogram was in 2013 which showed an EF of 55-65% with grade 1 diastolic dysfunction. She had a stress test in 2016 for chest pain which showed an EF of 85%.  12/25/2015  I saw Whitney Newton that today in the office. She has been taking the Lasix 40 mg daily which was over a week and this resulted in significant diuresis. Her weight had reduced from 202 down to 189. She then had to stop it for a few days because of some other medical problems are currently weight is up to 194. Her BNP associate with this was very low at 8 and renal function appeared to be stable. She reports a 75% improvement in breathing. Her repeat echo shows that EF remains stable with grade 1 diastolic dysfunction.  03/13/2016  Whitney Newton reports she's had a marked improvement in her swelling and breathing with additional diuretics. Weight has been fairly stable. She reports one episode of chest pain which was responsive to nitroglycerin. She's also had some reflux symptoms and is currently on Zegerid which appears to be helping her symptoms. Is not clear whether these episodes of chest discomfort are related to reflux or coronary ischemia. She did have a negative Myoview stress test in August 2016.  08/29/2016  Whitney Newton was seen today in the office. Over the past several day she's had worsening shortness of breath and lower extremity swelling. Her weight is now up about 6 pounds from her previous office visit. She said over the auscultation is been taking Lasix more regularly. She's currently taking 40 mg daily but as previously taken that for 3 days and alternating with 20 mg. She notes some improvement today after a couple days of diuretics.  10/08/2016  Whitney Newton returns today for follow-up. She has diuresed several pounds with increased dose diuretics are per creatinine rose and therefore I  decreased her diuretic back to 40 mg daily. She says she feels like she is over dried out on that dose of diuretic. She occasionally gets some cramping. She's also had some labile blood sugars which she attributes to the diuretics. In addition she had a recent bowel obstruction which may been related to diuresis.  03/06/2017  Whitney Newton was seen today in follow-up. She recently got back from a trip to assess catch one with her husband. She said they were traveling for about 16 days and she did not take her Lasix during that period of time. Her weight is up about 6 pounds that she reports lower extremity swelling. She has a sliding scale Lasix to take but has not been compliant with that.  08/20/2017  Whitney Newton returns today for follow-up.  She has been having a lot of symptoms recently.  She is complaining of pain across her abdomen.  This is been further assessed and there is a suggestion it could be related to back surgery.  She was also found to have  mass on the liver and pancreas, thought to be hemangioma.  She is scheduled to have a CT scan of her abdomen next week.  She continues to gain weight.  Her weight is up 6 pounds since I saw her in September.  Which was 203 at the time.  Her weight had been as low as 195 last year.  She does report lower extremity edema and some worsening shortness of breath however she relates the shortness of breath more to her pain.  She had been on higher dose Lasix in the past but was noted to have some elevation in creatinine.  We did reduce the dose and she has been compliant taking 20 mg daily.  02/05/2018  Whitney Newton returns for follow-up today.  Again she has numerous complaints however she is recently been having worsening chest discomfort.  She describes it as a squeezing or pressure in the center of her chest sometimes radiates up her neck.  May be associated with worsening shortness of breath.  Despite this she is taken extra Lasix and her weight is come down to her baseline of  203.  She has not had symptomatic improvement.  She was scheduled to see me in a few weeks but got an earlier appointment because of her chest discomfort.  Sometimes her symptoms are associated with exertion and relieved by rest at other times can be present at rest or when awakening.  Past Medical History:  Diagnosis Date  . Acute diastolic (congestive) heart failure ([place]) 12/25/2015  . Back pain    prior back surgery in 2009 - slow to recover  . Cancer ([place])   . Diabetes ([place])   . Diverticulitis   . Gastritis   . Hemangioma of liver    managed conservatively; followed at [place] (hyperlipidemia)    statin intolerant  . Hypertension   . Normal cardiac stress test 2011  . Obesity   . Thyroid disease    hypothyroidism    Past Surgical History:  Procedure Laterality Date  . ABDOMINAL HYSTERECTOMY    . APPENDECTOMY    . NASAL RECONSTRUCTION       Current Outpatient Medications  Medication Sig Dispense Refill  . aspirin 81 MG tablet Take 81 mg by mouth daily.    . bisoprolol-hydrochlorothiazide (ZIAC) 10-6.25 MG tablet Take 1 tablet by mouth daily. KEEP OV. 90 tablet 0  . clopidogrel (PLAVIX) 75 MG tablet TAKE 1 TABLET BY MOUTH DAILY. 90 tablet 3  . colesevelam (WELCHOL) 625 MG tablet Take 3 tablets (1,875 mg total) by mouth 2 (two) times daily as needed (cholesterol). 180 tablet 6  . furosemide (LASIX) 20 MG tablet Take 20 mg by mouth daily.    . furosemide (LASIX) 40 MG tablet TAKE 1 TABLET ONCE DAILY. 90 tablet 1  . gabapentin (NEURONTIN) 100 MG capsule Take 200 mg by mouth daily as needed (nerve pain).     Marland Kitchen glucose blood ([employer] BLOOD GLUCOSE TEST STRIPS) test strip Used to check Blood Sugar 4 times daily. Dx. Code 250.00    . HUMALOG KWIKPEN 100 UNIT/ML KiwkPen Use 9 at breakfast, 12 units at lunch and 14 at supper + sliding scale    . hyoscyamine (LEVBID) 0.375 MG 12 hr tablet Take 0.375 mg by mouth every 12 (twelve) hours as needed (cramps).     . insulin glargine  (LANTUS) 100 UNIT/ML injection Inject 26 Units into the skin every morning.     . Lancets (ACCU-CHEK MULTICLIX) lancets Used to  check Blood Sugar 4 times daily. Dx. Code 250.00.    Marland Kitchen levothyroxine (SYNTHROID, LEVOTHROID) 125 MCG tablet Take 88 mcg by mouth daily.     Marland Kitchen losartan (COZAAR) 25 MG tablet Take 1 tablet (25 mg total) by mouth daily. 90 tablet 2  . nitroGLYCERIN (NITROSTAT) 0.4 MG SL tablet DISSOLVE ONE TABLET UNDER TONGUE AS NEEDED FOR ARM/CHEST PAIN. 25 tablet PRN  . potassium chloride Newton (K-DUR,KLOR-CON) 20 MEQ tablet TAKE (2) TABLETS BY MOUTH DAILY. 180 tablet 0  . raNITIdine HCl (ZANTAC PO) Take by mouth daily.     No current facility-administered medications for this visit.     Allergies:   Celecoxib; Ropinirole hcl; Actos [pioglitazone hydrochloride]; Crestor [rosuvastatin calcium]; Doxycycline; Fenofibrate; Imdur [isosorbide nitrate]; Lipitor [atorvastatin calcium]; Metformin and related; Niaspan [niacin er]; Pioglitazone; Pravachol; Ranolazine er; Rosuvastatin; and Zetia [ezetimibe]    Social History:  The patient  reports that she has never smoked. She has never used smokeless tobacco. She reports that she does not drink alcohol or use drugs.   Family History:  The patient's family history includes Dementia in her father; Heart attack in her brother and father; Heart disease in her brother and father; Hypertension in her mother; Stroke in her mother.    ROS:   Pertinent items noted in HPI and remainder of comprehensive ROS otherwise negative.  PHYSICAL EXAM: VS:  Ht 5\' 3"  (1.6 m)   Wt 203 lb (92.1 kg)   BMI 35.96 kg/m  , BMI Body mass index is 35.96 kg/m. General appearance: alert, no distress and moderately obese Neck: no carotid bruit and no JVD Lungs: diminished breath sounds bilaterally Heart: regular rate and rhythm Abdomen: soft, non-tender; bowel sounds normal; no masses,  no organomegaly Extremities: edema 1+ pedal edema Pulses: 2+ and symmetric Skin:  Skin color, texture, turgor normal. No rashes or lesions Neurologic: Grossly normal Psych: Mildly anxious  EKG:   Sinus rhythm 69, incomplete right bundle branch block, poor R wave progression anteriorly-personally reviewed  Recent Labs: No results found for requested labs within last 8760 hours.    Lipid Panel    Component Value Date/Time   CHOL 209 (H) 11/15/2015 0954   TRIG 102 11/15/2015 0954   HDL 55 11/15/2015 0954   CHOLHDL 3.8 11/15/2015 0954   VLDL 20 11/15/2015 0954   LDLCALC 134 (H) 11/15/2015 0954   LDLDIRECT 148.7 09/14/2012 0857      Wt Readings from Last 3 Encounters:  02/04/18 203 lb (92.1 kg)  08/20/17 209 lb (94.8 kg)  03/06/17 203 lb 9.6 oz (92.4 kg)     ASSESSMENT AND PLAN:  1. Acute diastolic congestive heart failure 2. Hypertensive heart disease without heart failure 3. Diabetes mellitus - on insulin 4. Atypical chest pain with normal Myoview stress test in August 2016. No ischemia. Ejection fraction 85%. 84. old CVA followed at [facility] 6. History of dermatofibro- sarcoma of the spine. 7. Dyslipidemia 8. Osteoarthritis 9. OSA on CPAP [facility]) 10. Hypothyroidism followed at [facility]. 11. GERD, followed by Dr. Oletta Newton  12. RBBB   PLAN:  Mrs. Rape is describing chest pressure which has been worsening recently with shortness of breath.  She treated herself with extra Lasix, assuming possible acute on chronic diastolic congestive heart failure however despite weight loss and diuresis, she continues to be symptomatic.  She appears euvolemic on exam today.  Her last stress test was a Myoview in 2016 which was negative for ischemia.  She is never had a definitive heart  catheterization.  Given her ongoing chest discomfort, I would recommend a left heart catheterization.  I discussed the risk, benefits and alternatives with her and her husband today and there was to proceed.  We will try to schedule that in the near  future.  Follow-up with me afterwards.  Pixie Casino, MD, [facility], [place] Director of the Advanced Lipid Disorders &  Cardiovascular Risk Reduction Clinic Diplomate of the American Board of Clinical Lipidology Attending Cardiologist  Direct Dial: [phone]  Fax: [phone]  Website:  [email or web address]   02/04/2018 11:18 AM

## 2018-02-05 ENCOUNTER — Encounter: Payer: Self-pay | Admitting: Internal Medicine

## 2018-02-06 ENCOUNTER — Other Ambulatory Visit: Payer: Self-pay | Admitting: *Deleted

## 2018-02-06 ENCOUNTER — Telehealth: Payer: Self-pay | Admitting: Internal Medicine

## 2018-02-06 DIAGNOSIS — I200 Unstable angina: Secondary | ICD-10-CM

## 2018-02-06 DIAGNOSIS — Z01812 Encounter for preprocedural laboratory examination: Secondary | ICD-10-CM

## 2018-02-06 DIAGNOSIS — R0600 Dyspnea, unspecified: Secondary | ICD-10-CM

## 2018-02-06 NOTE — Telephone Encounter (Signed)
New message   Patient states that she has made about Cardiac Cath Procedure. Please call to discuss.

## 2018-02-06 NOTE — Telephone Encounter (Signed)
 @  [facility] OFFICE [place], [place] [address] Dept: [place]: [place]  02/06/2018  You are scheduled for a Cardiac Catheterization on Wednesday, September 4 with Dr. Shelva Majestic.  1. Please arrive at the [facility] (Main Entrance A) at [facility]: [address] [place], [place] [address] at 12:30 PM (This time is two hours before your procedure to ensure your preparation). Free valet parking service is available.   Special note: Every effort is made to have your procedure done on time. Please understand that emergencies sometimes delay scheduled procedures.  2. Diet: Do not eat solid foods after midnight. You may have clear liquids until 5am upon the day of the procedure - broth, jello, juice, water for example.  3. Labs: You will need to have blood drawn on Tuesday 9/3 early morning. You will come to Dr. Lysbeth Penner office for this - lab opens at 8am - sign in, ring bell.   4. Medication instructions in preparation for your procedure:  Take HALF DOSE of lantus the morning of procedure DO NOT use any short acting insulin the morning of your procedure   On the morning of your procedure, take your aspirin & plavix and any morning medicines NOT listed above.  You may use sips of water.  5. Plan for one night stay--bring personal belongings. 6. Bring a current list of your medications and current insurance cards. 7. You MUST have a responsible person to drive you home. 8. Someone MUST be with you the first 24 hours after you arrive home or your discharge will be delayed. 9. Please wear clothes that are easy to get on and off and wear slip-on shoes.  Thank you for allowing [name] to care for you!   -- [facility]

## 2018-02-06 NOTE — Telephone Encounter (Signed)
Spoke with patient. She would like to go ahead and schedule her left heart cath. She is able to do procedure next week.  Spoke with Santiago Glad in cath lab. Scheduled patient for Wednesday 9/4 @ 2:30pm with Dr. Claiborne Billings.   Patient called with cath info & instructions.   Will send instructions to patient in [place].

## 2018-02-10 ENCOUNTER — Telehealth: Payer: Self-pay

## 2018-02-10 DIAGNOSIS — E039 Hypothyroidism, unspecified: Secondary | ICD-10-CM | POA: Diagnosis not present

## 2018-02-10 DIAGNOSIS — Z01812 Encounter for preprocedural laboratory examination: Secondary | ICD-10-CM | POA: Diagnosis not present

## 2018-02-10 DIAGNOSIS — I200 Unstable angina: Secondary | ICD-10-CM | POA: Diagnosis not present

## 2018-02-10 LAB — CBC
HEMATOCRIT: 42.8 % (ref 34.0–46.6)
Hemoglobin: 15.1 g/dL (ref 11.1–15.9)
MCH: 31.5 pg (ref 26.6–33.0)
MCHC: 35.3 g/dL (ref 31.5–35.7)
MCV: 89 fL (ref 79–97)
PLATELETS: 289 10*3/uL (ref 150–450)
RBC: 4.8 x10E6/uL (ref 3.77–5.28)
RDW: 13.8 % (ref 12.3–15.4)
WBC: 9.7 10*3/uL (ref 3.4–10.8)

## 2018-02-10 LAB — BASIC METABOLIC PANEL
BUN / CREAT RATIO: 21 (ref 12–28)
BUN: 18 mg/dL (ref 8–27)
CHLORIDE: 103 mmol/L (ref 96–106)
CO2: 23 mmol/L (ref 20–29)
Calcium: 10.1 mg/dL (ref 8.7–10.3)
Creatinine, Ser: 0.84 mg/dL (ref 0.57–1.00)
GFR calc Af Amer: 78 mL/min/{1.73_m2} (ref >59)
GFR calc non Af Amer: 67 mL/min/{1.73_m2} (ref >59)
GLUCOSE: 180 mg/dL — AB (ref 65–99)
POTASSIUM: 5.2 mmol/L (ref 3.5–5.2)
Sodium: 140 mmol/L (ref 134–144)

## 2018-02-10 NOTE — Telephone Encounter (Signed)
Left CVM per DPR:  Patient contacted pre-catheterization at [facility] scheduled for:  2:30 pm on 02/11/2018 Arrival time and place:  Admitting at 12:30 pm Confirmed AM meds to be taken pre-cath with sip of water: Take ASA/Plavix Take half lantus Hold biso-hctz, lasix and kdure\ Notified patient must have responsible person to drive home post procedure and observe patient for 24 hours:  yes Addl concerns:   NEED LABS

## 2018-02-11 ENCOUNTER — Encounter (HOSPITAL_COMMUNITY)
Admission: RE | Disposition: A | Discharge status: Home / Self Care | Payer: Self-pay | Source: Ambulatory Visit | Attending: Cardiovascular Disease

## 2018-02-11 ENCOUNTER — Other Ambulatory Visit: Payer: Self-pay

## 2018-02-11 ENCOUNTER — Ambulatory Visit (HOSPITAL_COMMUNITY)
Admission: RE | Admit: 2018-02-11 | Discharge: 2018-02-11 | Disposition: A | Discharge status: Home / Self Care | Payer: Medicare Other | Source: Ambulatory Visit | Attending: Cardiovascular Disease | Admitting: Cardiovascular Disease

## 2018-02-11 DIAGNOSIS — Z7902 Long term (current) use of antithrombotics/antiplatelets: Secondary | ICD-10-CM | POA: Insufficient documentation

## 2018-02-11 DIAGNOSIS — K219 Gastro-esophageal reflux disease without esophagitis: Secondary | ICD-10-CM | POA: Diagnosis not present

## 2018-02-11 DIAGNOSIS — Z8719 Personal history of other diseases of the digestive system: Secondary | ICD-10-CM | POA: Insufficient documentation

## 2018-02-11 DIAGNOSIS — Z7982 Long term (current) use of aspirin: Secondary | ICD-10-CM | POA: Insufficient documentation

## 2018-02-11 DIAGNOSIS — G4733 Obstructive sleep apnea (adult) (pediatric): Secondary | ICD-10-CM | POA: Diagnosis not present

## 2018-02-11 DIAGNOSIS — E039 Hypothyroidism, unspecified: Secondary | ICD-10-CM | POA: Insufficient documentation

## 2018-02-11 DIAGNOSIS — Z79899 Other long term (current) drug therapy: Secondary | ICD-10-CM | POA: Diagnosis not present

## 2018-02-11 DIAGNOSIS — I5032 Chronic diastolic (congestive) heart failure: Secondary | ICD-10-CM | POA: Diagnosis not present

## 2018-02-11 DIAGNOSIS — I4510 Unspecified right bundle-branch block: Secondary | ICD-10-CM | POA: Diagnosis not present

## 2018-02-11 DIAGNOSIS — E119 Type 2 diabetes mellitus without complications: Secondary | ICD-10-CM | POA: Diagnosis not present

## 2018-02-11 DIAGNOSIS — Z888 Allergy status to other drugs, medicaments and biological substances status: Secondary | ICD-10-CM | POA: Diagnosis not present

## 2018-02-11 DIAGNOSIS — I2510 Atherosclerotic heart disease of native coronary artery without angina pectoris: Secondary | ICD-10-CM

## 2018-02-11 DIAGNOSIS — Z823 Family history of stroke: Secondary | ICD-10-CM | POA: Insufficient documentation

## 2018-02-11 DIAGNOSIS — Z794 Long term (current) use of insulin: Secondary | ICD-10-CM | POA: Diagnosis not present

## 2018-02-11 DIAGNOSIS — Z79890 Hormone replacement therapy (postmenopausal): Secondary | ICD-10-CM | POA: Diagnosis not present

## 2018-02-11 DIAGNOSIS — R0600 Dyspnea, unspecified: Secondary | ICD-10-CM

## 2018-02-11 DIAGNOSIS — I259 Chronic ischemic heart disease, unspecified: Secondary | ICD-10-CM | POA: Insufficient documentation

## 2018-02-11 DIAGNOSIS — E669 Obesity, unspecified: Secondary | ICD-10-CM | POA: Insufficient documentation

## 2018-02-11 DIAGNOSIS — Z6835 Body mass index (BMI) 35.0-35.9, adult: Secondary | ICD-10-CM | POA: Diagnosis not present

## 2018-02-11 DIAGNOSIS — I110 Hypertensive heart disease with heart failure: Secondary | ICD-10-CM | POA: Diagnosis not present

## 2018-02-11 DIAGNOSIS — Z859 Personal history of malignant neoplasm, unspecified: Secondary | ICD-10-CM | POA: Insufficient documentation

## 2018-02-11 DIAGNOSIS — M1990 Unspecified osteoarthritis, unspecified site: Secondary | ICD-10-CM | POA: Insufficient documentation

## 2018-02-11 DIAGNOSIS — E785 Hyperlipidemia, unspecified: Secondary | ICD-10-CM | POA: Diagnosis not present

## 2018-02-11 DIAGNOSIS — Z8673 Personal history of transient ischemic attack (TIA), and cerebral infarction without residual deficits: Secondary | ICD-10-CM | POA: Diagnosis not present

## 2018-02-11 DIAGNOSIS — Z90710 Acquired absence of both cervix and uterus: Secondary | ICD-10-CM | POA: Insufficient documentation

## 2018-02-11 DIAGNOSIS — Z8249 Family history of ischemic heart disease and other diseases of the circulatory system: Secondary | ICD-10-CM | POA: Diagnosis not present

## 2018-02-11 DIAGNOSIS — Z9889 Other specified postprocedural states: Secondary | ICD-10-CM | POA: Insufficient documentation

## 2018-02-11 DIAGNOSIS — Z881 Allergy status to other antibiotic agents status: Secondary | ICD-10-CM | POA: Diagnosis not present

## 2018-02-11 DIAGNOSIS — I200 Unstable angina: Secondary | ICD-10-CM

## 2018-02-11 HISTORY — PX: LEFT HEART CATH AND CORONARY ANGIOGRAPHY: CATH118249

## 2018-02-11 LAB — GLUCOSE, CAPILLARY: Glucose-Capillary: 230 mg/dL — ABNORMAL HIGH (ref 70–99)

## 2018-02-11 SURGERY — LEFT HEART CATH AND CORONARY ANGIOGRAPHY
Anesthesia: LOCAL

## 2018-02-11 MED ORDER — FENTANYL CITRATE (PF) 100 MCG/2ML IJ SOLN
INTRAMUSCULAR | Status: DC | PRN
  Administered 2018-02-11 (×2): 50 ug via INTRAVENOUS

## 2018-02-11 MED ORDER — FENTANYL CITRATE (PF) 100 MCG/2ML IJ SOLN
INTRAMUSCULAR | Status: AC
  Filled 2018-02-11: qty 2

## 2018-02-11 MED ORDER — SODIUM CHLORIDE 0.9% FLUSH: 3.0000 mL | INTRAVENOUS | Status: DC | PRN

## 2018-02-11 MED ORDER — VERAPAMIL HCL 2.5 MG/ML IV SOLN
INTRAVENOUS | Status: DC | PRN
  Administered 2018-02-11: 10 mL via INTRA_ARTERIAL

## 2018-02-11 MED ORDER — ONDANSETRON HCL 4 MG/2ML IJ SOLN: 4.0000 mg | Freq: Four times a day (QID) | INTRAMUSCULAR | Status: DC | PRN

## 2018-02-11 MED ORDER — ISOSORBIDE MONONITRATE ER 30 MG PO TB24
30.0000 mg | ORAL_TABLET | Freq: Every day | ORAL | Status: DC
  Filled 2018-02-11: qty 1

## 2018-02-11 MED ORDER — SODIUM CHLORIDE 0.9% FLUSH: 3.0000 mL | Freq: Two times a day (BID) | INTRAVENOUS | Status: DC

## 2018-02-11 MED ORDER — SODIUM CHLORIDE 0.9 % IV SOLN: INTRAVENOUS | Status: DC

## 2018-02-11 MED ORDER — LIDOCAINE HCL (PF) 1 % IJ SOLN
INTRAMUSCULAR | Status: DC | PRN
  Administered 2018-02-11: 2 mL via INTRADERMAL

## 2018-02-11 MED ORDER — HEPARIN (PORCINE) IN NACL 1000-0.9 UT/500ML-% IV SOLN
INTRAVENOUS | Status: DC | PRN
  Administered 2018-02-11 (×2): 500 mL

## 2018-02-11 MED ORDER — SODIUM CHLORIDE 0.9 % IV SOLN
INTRAVENOUS | Status: DC
  Administered 2018-02-11: 14:00:00 via INTRAVENOUS

## 2018-02-11 MED ORDER — MIDAZOLAM HCL 2 MG/2ML IJ SOLN
INTRAMUSCULAR | Status: AC
  Filled 2018-02-11: qty 2

## 2018-02-11 MED ORDER — CLOPIDOGREL BISULFATE 75 MG PO TABS: 75.0000 mg | ORAL_TABLET | Freq: Every day | ORAL | Status: DC

## 2018-02-11 MED ORDER — SODIUM CHLORIDE 0.9 % IV SOLN: 250.0000 mL | INTRAVENOUS | Status: DC | PRN

## 2018-02-11 MED ORDER — DIAZEPAM 5 MG PO TABS: 5.0000 mg | ORAL_TABLET | Freq: Four times a day (QID) | ORAL | Status: DC | PRN

## 2018-02-11 MED ORDER — HEPARIN SODIUM (PORCINE) 1000 UNIT/ML IJ SOLN
INTRAMUSCULAR | Status: AC
  Filled 2018-02-11: qty 1

## 2018-02-11 MED ORDER — VERAPAMIL HCL 2.5 MG/ML IV SOLN
INTRAVENOUS | Status: AC
  Filled 2018-02-11: qty 2

## 2018-02-11 MED ORDER — LIDOCAINE HCL (PF) 1 % IJ SOLN
INTRAMUSCULAR | Status: AC
  Filled 2018-02-11: qty 30

## 2018-02-11 MED ORDER — IOPAMIDOL (ISOVUE-370) INJECTION 76%
INTRAVENOUS | Status: DC | PRN
  Administered 2018-02-11: 60 mL

## 2018-02-11 MED ORDER — MIDAZOLAM HCL 2 MG/2ML IJ SOLN
INTRAMUSCULAR | Status: DC | PRN
  Administered 2018-02-11 (×2): 1 mg via INTRAVENOUS

## 2018-02-11 MED ORDER — HEPARIN SODIUM (PORCINE) 1000 UNIT/ML IJ SOLN
INTRAMUSCULAR | Status: DC | PRN
  Administered 2018-02-11: 4500 [IU] via INTRAVENOUS

## 2018-02-11 MED ORDER — ASPIRIN 81 MG PO CHEW: 81.0000 mg | CHEWABLE_TABLET | Freq: Every day | ORAL | Status: DC

## 2018-02-11 MED ORDER — HEPARIN (PORCINE) IN NACL 1000-0.9 UT/500ML-% IV SOLN
INTRAVENOUS | Status: AC
  Filled 2018-02-11: qty 1000

## 2018-02-11 MED ORDER — ASPIRIN 81 MG PO CHEW: 81.0000 mg | CHEWABLE_TABLET | ORAL | Status: DC

## 2018-02-11 MED ORDER — ACETAMINOPHEN 325 MG PO TABS: 650.0000 mg | ORAL_TABLET | ORAL | Status: DC | PRN

## 2018-02-11 SURGICAL SUPPLY — 12 items

## 2018-02-11 NOTE — Discharge Instructions (Signed)

## 2018-02-11 NOTE — Interval H&P Note (Signed)
Cath Lab Visit (complete for each Cath Lab visit)  Clinical Evaluation Leading to the Procedure:   ACS: No.  Non-ACS:    Anginal Classification: CCS III  Anti-ischemic medical therapy: Minimal Therapy (1 class of medications)  Non-Invasive Test Results: No non-invasive testing performed  Prior CABG: No previous CABG      History and Physical Interval Note:  02/11/2018 4:22 PM  Whitney Newton  has presented today for surgery, with the diagnosis of ua  The various methods of treatment have been discussed with the patient and family. After consideration of risks, benefits and other options for treatment, the patient has consented to  Procedure(s): LEFT HEART CATH AND CORONARY ANGIOGRAPHY (N/A) as a surgical intervention .  The patient's history has been reviewed, patient examined, no change in status, stable for surgery.  I have reviewed the patient's chart and labs.  Questions were answered to the patient's satisfaction.     Shelva Majestic

## 2018-02-12 ENCOUNTER — Telehealth: Payer: Self-pay | Admitting: Internal Medicine

## 2018-02-12 ENCOUNTER — Encounter (HOSPITAL_COMMUNITY): Payer: Self-pay | Admitting: Cardiovascular Disease

## 2018-02-12 NOTE — Telephone Encounter (Signed)
° ° °  Patient has questions post cath

## 2018-02-12 NOTE — Telephone Encounter (Signed)
SPOKE TO PATIENT. PATIENT STATES SHE DID NOT GET ALL THE INFORMATION SHE  NEEDED AFTER  CATH YESTERDAY.   RN DID INFORM HER  OF DR Evette Georges  RESPONSE ON CATH REPORT. PATIENT AWARE TO WRITE DOWN ANY QUESTION TO ASK DR HILTY AT 02/23/18 APPOINTMENT.Whitney Newton

## 2018-02-13 DIAGNOSIS — E1165 Type 2 diabetes mellitus with hyperglycemia: Secondary | ICD-10-CM | POA: Diagnosis not present

## 2018-02-13 DIAGNOSIS — E039 Hypothyroidism, unspecified: Secondary | ICD-10-CM | POA: Diagnosis not present

## 2018-02-13 DIAGNOSIS — Z794 Long term (current) use of insulin: Secondary | ICD-10-CM | POA: Diagnosis not present

## 2018-02-13 DIAGNOSIS — I2510 Atherosclerotic heart disease of native coronary artery without angina pectoris: Secondary | ICD-10-CM | POA: Diagnosis not present

## 2018-02-23 ENCOUNTER — Encounter: Payer: Self-pay | Admitting: Internal Medicine

## 2018-02-23 ENCOUNTER — Ambulatory Visit (INDEPENDENT_AMBULATORY_CARE_PROVIDER_SITE_OTHER): Payer: Medicare Other | Admitting: Internal Medicine

## 2018-02-23 VITALS — BP 132/70 | HR 74 | Ht 62.5 in | Wt 206.0 lb

## 2018-02-23 DIAGNOSIS — Z789 Other specified health status: Secondary | ICD-10-CM

## 2018-02-23 DIAGNOSIS — I200 Unstable angina: Secondary | ICD-10-CM | POA: Diagnosis not present

## 2018-02-23 DIAGNOSIS — E785 Hyperlipidemia, unspecified: Secondary | ICD-10-CM

## 2018-02-23 DIAGNOSIS — I2510 Atherosclerotic heart disease of native coronary artery without angina pectoris: Secondary | ICD-10-CM

## 2018-02-23 DIAGNOSIS — R0600 Dyspnea, unspecified: Secondary | ICD-10-CM

## 2018-02-23 NOTE — Patient Instructions (Addendum)
Dr. Debara Pickett recommends Repatha (PCSK9). This is an injectable cholesterol medication. This medication will need prior approval with your insurance company, which we will work on. If the medication is not approved initially, we may need to do an appeal with your insurance. We will keep you updated on this process. This medication can be provided at some local pharmacies or be shipped to your from a specialty pharmacy. Please review the provided patient assistance application. If you meet the income criteria, please complete application as we can submit it once we receive approval from your insurance company.  -- once the medication is shipped to you (from either pharmacy or drug company thru patient assistance), if you wish to arrange a day to come in to the office to do your first injection, please notify our office - this is generally done with our clinical pharmacy staff  Your physician recommends that you return for lab work FASTING to check cholesterol   Your physician wants you to follow-up in: 6 months with Dr. Debara Pickett. You will receive a reminder letter in the mail two months in advance. If you don't receive a letter, please call our office to schedule the follow-up appointment.

## 2018-02-24 ENCOUNTER — Encounter: Payer: Self-pay | Admitting: Internal Medicine

## 2018-02-24 ENCOUNTER — Telehealth: Payer: Self-pay | Admitting: Internal Medicine

## 2018-02-24 NOTE — Telephone Encounter (Signed)
New message   Patient wants a call in reference to her lipid panel. Please call to discuss.

## 2018-02-24 NOTE — Telephone Encounter (Signed)
Yes - ok to use this to apply for PCSK9.  Dr. Lemmie Evens

## 2018-02-24 NOTE — Telephone Encounter (Signed)
Spoke with pt who states she had recent lab work from her pcp and thinks she had a lipid panel as well. Faxed number provided to pt to send for Dr. Debara Pickett to review. Pt states if its not sufficient, she would have repeat labs drawn with current active order. Will route to MD and Nurse.

## 2018-02-24 NOTE — Progress Notes (Signed)
Cardiology Office Note   Date:  02/24/2018   ID:  Whitney Newton, DOB 05-Jul-1940, MRN [id number]  PCP:  Aurea Graff.Marlou Sa, MD   CC: Follow-up heart cath  History of Present Illness: Whitney Newton is a 77 y.o. female who presents for  Four-month follow-up visit  This pleasant 77 year old woman is seen for a scheduled followup visit. The patient has a history of having had a stroke in early December 2014. She was at her diabetes clinic at [facility] and they diagnosed her and sent her straight to neurology where she was hospitalized for 3 days. The MRI showed a new stroke on the right side. She had carotid Dopplers which did not show any obstructive lesions but she did have some plaque and they put her back on aspirin and continued her Plavix. The patient had a echocardiogram which was a bubble study and did not show any evidence of right-to-left shunt. More recently, she has had further neurology workup at [facility] on 06/20/14. She had a transcranial Doppler. 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 Previous carotid Dopplers had not shown any significant external cranial disease She has a history of essential hypertension, diabetes mellitus, and dyslipidemia. She has had atypical chest pain. She is intolerant of statin drugs. We updated her lexiscan Myoview stress test on 03/17/12 and it was normal showing no evidence of ischemia and her ejection fraction was 78%. The patient has never had a cardiac catheterization. She has continued to have some shortness of breath. She had an echocardiogram in 04/07/12 which showed an ejection fraction of 55-65% with grade 1 diastolic dysfunction. There is trivial aortic insufficiency.  The patient has a history of essential hypertension as well as atypical chest pain. Her diabetes is now being followed at  the [facility] clinic at [facility]. Since we last saw her she had an ophthalmology evaluation on 07/27/14 by Dr. Kathrin Penner and no diabetic retinopathy was found. Her last chest x-ray was on 12/21/12 elsewhere and showed a normal heart size and low lung volumes. She had a recent CT scan of the chest on 03/23/13 at [facility] which showed normal heart size and demonstrated atherosclerosis of the aorta.  The patient has a past history of dermatofibrosarcoma of the spine. She had surgery for this at [facility]. She also has a past history of angiolipoma of the abdominal wall.  Since last visit she has not been experiencing any new cardiac symptoms. She does have sleep apnea. She uses a CPAP machine. She has had some low back pain and pain in her hip radiating to her knees. She has seen Dr. Durward Fortes. She was admitted to the hospital on 01/18/15 for chest and abdominal pain. She had a CTA of the chest on 01/18/15 which was negative for pulmonary embolus. The following day she had a Myoview on 01/19/15 which showed no ischemia and her ejection fraction was 85%. She had a lot of side effects from the [facility]. It was felt in retrospect that her presenting complaints were probably GI in origin rather than cardiac. She does have a history of GERD and sees Dr. Oletta Lamas.  since last visit she has had no new cardiac symptoms.  She has not been having any recent chest discomfort. Her weight is unchanged and her blood pressures been stable.  11/22/2015  Whitney Newton presents today  to establish care with me. She is a previous patient of Dr. Warren Danes. She recently was at the beach and developed some worsening shortness of breath and leg swelling. Her nephew was there who came down from [place] with a head cold and she seems to have symptoms now at this time. She was also seen at an urgent care there and placed on amoxicillin. She's taken that for a few days. She notes significant  swelling in her weight is much higher than it had been in the past. She endorses a high salt intake. Her last echocardiogram was in 2013 which showed an EF of 55-65% with grade 1 diastolic dysfunction. She had a stress test in 2016 for chest pain which showed an EF of 85%.  12/25/2015  I saw Whitney Newton that today in the office. She has been taking the Lasix 40 mg daily which was over a week and this resulted in significant diuresis. Her weight had reduced from 202 down to 189. She then had to stop it for a few days because of some other medical problems are currently weight is up to 194. Her BNP associate with this was very low at 8 and renal function appeared to be stable. She reports a 75% improvement in breathing. Her repeat echo shows that EF remains stable with grade 1 diastolic dysfunction.  03/13/2016  Whitney Newton reports she's had a marked improvement in her swelling and breathing with additional diuretics. Weight has been fairly stable. She reports one episode of chest pain which was responsive to nitroglycerin. She's also had some reflux symptoms and is currently on Zegerid which appears to be helping her symptoms. Is not clear whether these episodes of chest discomfort are related to reflux or coronary ischemia. She did have a negative Myoview stress test in August 2016.  08/29/2016  Whitney Newton was seen today in the office. Over the past several day she's had worsening shortness of breath and lower extremity swelling. Her weight is now up about 6 pounds from her previous office visit. She said over the auscultation is been taking Lasix more regularly. She's currently taking 40 mg daily but as previously taken that for 3 days and alternating with 20 mg. She notes some improvement today after a couple days of diuretics.  10/08/2016  Whitney Newton returns today for follow-up. She has diuresed several pounds with increased dose diuretics are per creatinine rose and therefore I decreased her  diuretic back to 40 mg daily. She says she feels like she is over dried out on that dose of diuretic. She occasionally gets some cramping. She's also had some labile blood sugars which she attributes to the diuretics. In addition she had a recent bowel obstruction which may been related to diuresis.  03/06/2017  Whitney Newton was seen today in follow-up. She recently got back from a trip to assess catch one with her husband. She said they were traveling for about 16 days and she did not take her Lasix during that period of time. Her weight is up about 6 pounds that she reports lower extremity swelling. She has a sliding scale Lasix to take but has not been compliant with that.  08/20/2017  Whitney Newton returns today for follow-up.  She has been having a lot of symptoms recently.  She is complaining of pain across her abdomen.  This is been further assessed and there is a suggestion it could be related to back surgery.  She was also found to have mass on  the liver and pancreas, thought to be hemangioma.  She is scheduled to have a CT scan of her abdomen next week.  She continues to gain weight.  Her weight is up 6 pounds since I saw her in September.  Which was 203 at the time.  Her weight had been as low as 195 last year.  She does report lower extremity edema and some worsening shortness of breath however she relates the shortness of breath more to her pain.  She had been on higher dose Lasix in the past but was noted to have some elevation in creatinine.  We did reduce the dose and she has been compliant taking 20 mg daily.  02/05/2018  Whitney Newton returns for follow-up today.  Again she has numerous complaints however she is recently been having worsening chest discomfort.  She describes it as a squeezing or pressure in the center of her chest sometimes radiates up her neck.  May be associated with worsening shortness of breath.  Despite this she is taken extra Lasix and her weight is come down to her baseline of 203.  She has  not had symptomatic improvement.  She was scheduled to see me in a few weeks but got an earlier appointment because of her chest discomfort.  Sometimes her symptoms are associated with exertion and relieved by rest at other times can be present at rest or when awakening.  02/23/2018  Whitney Newton was seen today in follow-up of heart cath.  She underwent left heart catheterization on 02/11/2018.  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  LVEF was 65% with normal LVEDP 18 mmHg.  Post cath she still reports shortness of breath with certain activities.  She did have recent lipid testing which was as follows: LDL 148, Total chol 225, HDL 54, Non-HDL 171, TG 178.  Her goal LDL is less than 70 and she has not yet reach that target.  Unfortunately she has been statin intolerant and is a good candidate for PCSK9 inhibitor.  Past Medical History:  Diagnosis Date  . Acute diastolic (congestive) heart failure ([place]) 12/25/2015  . Back pain    prior back surgery in 2009 - slow to recover  . Cancer ([place])   . Diabetes ([place])   . Diverticulitis   . Gastritis   . Hemangioma of liver    managed conservatively; followed at [place] (hyperlipidemia)    statin intolerant  . Hypertension   . Normal cardiac stress test 2011  . Obesity   . Thyroid disease    hypothyroidism    Past Surgical History:  Procedure Laterality Date  . ABDOMINAL HYSTERECTOMY    . APPENDECTOMY    . LEFT HEART CATH AND CORONARY ANGIOGRAPHY N/A 02/11/2018   Procedure: LEFT HEART CATH AND CORONARY ANGIOGRAPHY;  Surgeon: Troy Sine, MD;  Location: [facility];  Service: Cardiovascular;  Laterality: N/A;  . NASAL RECONSTRUCTION       Current Outpatient Medications  Medication Sig Dispense Refill  . aspirin 81 MG tablet Take 81  mg by mouth daily.    . bisoprolol-hydrochlorothiazide (ZIAC) 10-6.25 MG tablet Take 1 tablet by mouth daily. KEEP OV. 90 tablet 0  . clopidogrel (PLAVIX) 75 MG tablet TAKE 1 TABLET BY MOUTH DAILY. (Patient taking differently: Take 75 mg by mouth daily. )  90 tablet 3  . colesevelam (WELCHOL) 625 MG tablet Take 3 tablets (1,875 mg total) by mouth 2 (two) times daily as needed (cholesterol). 180 tablet 6  . diclofenac sodium (VOLTAREN) 1 % GEL Apply 1 application topically at bedtime as needed (for pain).    . furosemide (LASIX) 40 MG tablet TAKE 1 TABLET ONCE DAILY. (Patient taking differently: Take 20-40 mg by mouth See admin instructions. Take 20 mg by mouth daily. Take 40 mg by mouth daily if needed for fluid.) 90 tablet 1  . gabapentin (NEURONTIN) 100 MG capsule Take 200 mg by mouth at bedtime.     Marland Kitchen HUMALOG KWIKPEN 100 UNIT/ML KiwkPen Inject 9-14 Units into the skin See admin instructions. Inject 11 units SQ at breakfast, inject 14 units SQ at lunch and inject 16 units SQ at supper. Plus sliding scale 1 unit for every 25 BS is > 120    . hyoscyamine (LEVBID) 0.375 MG 12 hr tablet Take 0.375 mg by mouth daily.     . insulin glargine (LANTUS) 100 UNIT/ML injection Inject 26 Units into the skin every morning.     Marland Kitchen levothyroxine (SYNTHROID, LEVOTHROID) 88 MCG tablet Take 88 mcg by mouth daily.     . Liniments (SALONPAS EX) Apply 1 patch topically daily as needed (for pain).    Marland Kitchen losartan (COZAAR) 25 MG tablet Take 1 tablet (25 mg total) by mouth daily. 90 tablet 2  . nitroGLYCERIN (NITROSTAT) 0.4 MG SL tablet DISSOLVE ONE TABLET UNDER TONGUE AS NEEDED FOR ARM/CHEST PAIN. (Patient taking differently: Place 0.4 mg under the tongue every 5 (five) minutes as needed for chest pain. ) 25 tablet PRN  . potassium chloride SA (K-DUR,KLOR-CON) 20 MEQ tablet TAKE (2) TABLETS BY MOUTH DAILY. (Patient taking differently: Take 20-40 mEq by mouth See admin instructions. Take 20 meq by mouth daily. Take 40 meq by  mouth daily when taking 40 mg lasix.) 180 tablet 0  . ranitidine (ZANTAC) 150 MG tablet Take 150 mg by mouth daily.      No current facility-administered medications for this visit.     Allergies:   Celecoxib; Lipitor [atorvastatin calcium]; Zetia [ezetimibe]; Ropinirole hcl; Actos [pioglitazone hydrochloride]; Crestor [rosuvastatin calcium]; Doxycycline; Fenofibrate; Imdur [isosorbide nitrate]; Metformin and related; Pravachol; Ranolazine er; and Niaspan [niacin er]    Social History:  The patient  reports that she has never smoked. She has never used smokeless tobacco. She reports that she does not drink alcohol or use drugs.   Family History:  The patient's family history includes Dementia in her father; Heart attack in her brother and father; Heart disease in her brother and father; Hypertension in her mother; Stroke in her mother.    ROS:   Pertinent items noted in HPI and remainder of comprehensive ROS otherwise negative.  PHYSICAL EXAM: VS:  BP 132/70 (BP Location: Left Arm, Patient Position: Sitting, Cuff Size: Large)   Pulse 74   Ht 5' 2.5" (1.588 m)   Wt 206 lb (93.4 kg)   SpO2 98%   BMI 37.08 kg/m  , BMI Body mass index is 37.08 kg/m. General appearance: alert, no distress and moderately obese Neck: no carotid bruit and no JVD Lungs: diminished breath sounds bilaterally Heart: regular rate and rhythm Abdomen: soft, non-tender; bowel sounds normal; no masses,  no organomegaly Extremities: edema 1+ pedal edema Pulses: 2+ and symmetric Skin: Skin color, texture, turgor normal. No rashes or lesions Neurologic: Grossly normal Psych: Mildly anxious  EKG:   Deferred  Recent  Labs: 02/10/2018: BUN 18; Creatinine, Ser 0.84; Hemoglobin 15.1; Platelets 289; Potassium 5.2; Sodium 140    Lipid Panel    Component Value Date/Time   CHOL 209 (H) 11/15/2015 0954   TRIG 102 11/15/2015 0954   HDL 55 11/15/2015 0954   CHOLHDL 3.8 11/15/2015 0954   VLDL 20 11/15/2015 0954    LDLCALC 134 (H) 11/15/2015 0954   LDLDIRECT 148.7 09/14/2012 0857      Wt Readings from Last 3 Encounters:  02/23/18 206 lb (93.4 kg)  02/11/18 203 lb 0.7 oz (92.1 kg)  02/04/18 203 lb (92.1 kg)     ASSESSMENT AND PLAN:  1. Acute diastolic congestive heart failure 2. Hypertensive heart disease without heart failure 3. Diabetes mellitus - on insulin 4. Atypical chest pain with normal Myoview stress test in August 2016. No ischemia. Ejection fraction 85%. 54. old CVA followed at [facility] 6. History of dermatofibro- sarcoma of the spine. 7. Dyslipidemia 8. Osteoarthritis 9. OSA on CPAP [facility]) 10. Hypothyroidism followed at [facility]. 11. GERD, followed by Dr. Oletta Lamas  12. RBBB   PLAN:  Mrs. Bezanson had mild to moderate nonobstructive coronary disease by cath with normal LVEDP and normal LV function.  She still reports shortness of breath which is somewhat positional, including when she raises her arms overhead.  The etiology is that this is not totally clear.  LDL remains above goal at 145.  Unfortunately she has been statin intolerant.  Given her diabetes and known coronary artery disease she needs aggressive medical therapy it is good candidate for PCSK9 inhibitor.  We will pursue the addition of PCSK9 in addition to her current regimen.  Follow-up with me in 3 to 6 months.  Pixie Casino, MD, [facility], [place] Director of the Advanced Lipid Disorders &  Cardiovascular Risk Reduction Clinic Diplomate of the American Board of Clinical Lipidology Attending Cardiologist  Direct Dial: [phone]  Fax: [phone]  Website:  [email or web address]   02/24/2018 6:31 PM

## 2018-02-24 NOTE — Telephone Encounter (Signed)
Spoke with patient who reports her endocrinologist Dr. Quay Burow checked her lipid panel on 02/10/18 (care everywhere). She was fasting for the labs. She would like to know if these labs are sufficient per Dr. Debara Pickett so she does not need to repeat her lipid profile. Will route to MD  LDL 148 Total chol 225 HDL 54 Non-HDL 171 TG 178

## 2018-02-25 NOTE — Telephone Encounter (Signed)
Request Reference Number: [id number].  REPATHA SURE INJ 140MG /ML is approved through 08/26/2018  Patient called & notified of approval. She was concerned they would not be able to apply for patient assistance based on the verbiage of the application. Reviewed the application with patient and advised her which sections she needs to check off on - I have medicare part D & cannot afford my high out of pocket cost. Her household meets the financial criteria. Advised that she complete application as soon as possible and return to office so it can be faxed.

## 2018-02-25 NOTE — Telephone Encounter (Signed)
Great news as well.  Dr. Lemmie Evens

## 2018-02-25 NOTE — Telephone Encounter (Signed)
Prior auth for Repatha submitted via [email or web address] Key: [id number]

## 2018-02-27 NOTE — Telephone Encounter (Signed)
Received patient's [place] manager. There is one questions unanswered about medicare low income subsidy. Attempted to call patient on both #s but there was no answer @ home # and no VM @ cell #. MyChart message sent to patient

## 2018-03-02 NOTE — Telephone Encounter (Signed)
Faxed patient assistance application for Repatha [employer] to [employer] @ [phone]

## 2018-03-03 NOTE — Telephone Encounter (Signed)
Received fax notice that patient has been approved for Repatha free of charge from [employer] from 03/02/18 - 06/09/18.   Patient notified via [place]

## 2018-03-06 ENCOUNTER — Telehealth: Payer: Self-pay | Admitting: Internal Medicine

## 2018-03-06 NOTE — Telephone Encounter (Signed)
Spoke with patient about Repatha administration and steps to self-administer medication. She was concerned about a reaction to the medication, as she has had reactions to other cholesterol meds in the past. Advised that this medication works differently than statins, etc. She is familiar with process of injections, as she self-administers insulin. She will complete injection today and she was advised to call with any questions, concerns, etc.

## 2018-03-06 NOTE — Telephone Encounter (Signed)
New Message         Patient states that she has received her "Repatha" and was told that she could come in and get directions on how to do her first dosage. Pls call and advise

## 2018-03-06 NOTE — Telephone Encounter (Signed)
Will forward to Clarnce Flock RN with Dr Debara Pickett

## 2018-03-10 ENCOUNTER — Other Ambulatory Visit: Payer: Self-pay | Admitting: Internal Medicine

## 2018-03-13 DIAGNOSIS — Z780 Asymptomatic menopausal state: Secondary | ICD-10-CM | POA: Diagnosis not present

## 2018-03-24 DIAGNOSIS — E669 Obesity, unspecified: Secondary | ICD-10-CM | POA: Diagnosis not present

## 2018-03-24 DIAGNOSIS — G4733 Obstructive sleep apnea (adult) (pediatric): Secondary | ICD-10-CM | POA: Diagnosis not present

## 2018-03-24 DIAGNOSIS — Z9989 Dependence on other enabling machines and devices: Secondary | ICD-10-CM | POA: Diagnosis not present

## 2018-03-27 DIAGNOSIS — E1165 Type 2 diabetes mellitus with hyperglycemia: Secondary | ICD-10-CM | POA: Diagnosis not present

## 2018-03-27 DIAGNOSIS — Z23 Encounter for immunization: Secondary | ICD-10-CM | POA: Diagnosis not present

## 2018-03-27 DIAGNOSIS — Z794 Long term (current) use of insulin: Secondary | ICD-10-CM | POA: Diagnosis not present

## 2018-03-31 ENCOUNTER — Telehealth: Payer: Self-pay | Admitting: Internal Medicine

## 2018-03-31 NOTE — Telephone Encounter (Signed)
New Message   Dentist office calling, patient in the chair and need clearance for teeth cleaning

## 2018-03-31 NOTE — Telephone Encounter (Signed)
Received call from dentist Dr.Irving stated her dental hygienist was concerned when she brought patient back to have dental cleaning she was very sob.She wanted to make sure ok to clean teeth.Patient was upset she wanted to have teeth cleaned.Spoke to patient she stated her sob was better.Stated she had trouble parking her car and she had to walk up a hill in the rain to get to office.Stated her breathing is back to normal now.She did not sound sob.Advised to have dental cleaning.Follow up appointment scheduled with Dr.Hilty 05/22/18 at 11:00 am.Advised to call sooner if needed.

## 2018-04-01 ENCOUNTER — Telehealth: Payer: Self-pay

## 2018-04-01 ENCOUNTER — Telehealth: Payer: Self-pay | Admitting: Internal Medicine

## 2018-04-01 NOTE — Telephone Encounter (Addendum)
   Primary Cardiologist: Pixie Casino, MD  Chart reviewed as part of pre-operative protocol coverage. Patient was contacted 04/01/2018 in reference to pre-operative risk assessment for pending surgery as outlined below.  Whitney Newton was last seen on 02/23/2018 by Dr. Debara Pickett.  Since that day, Whitney Newton has done well.  When she went to the dentist, she was having some problems with increasing dyspnea on exertion.  She admits that she held her Lasix for a couple of days because of those appointments.  She stated that her weight has been generally stable and until she held the Lasix, she was not having unusual dyspnea on exertion.  However, she admits that she was having increased dyspnea on exertion in the last couple of days.  She states she was planning on taking an entire Lasix tablet today instead of just 1/2 tablet that she normally takes.  I recommended that she take a whole tablet today and tomorrow and continue daily weights and low-sodium diet.  Of note, she is also thinking about just having the tooth pulled instead of getting a crown.  I advised her that from a cardiac standpoint there was no difference.  At this time, keep scheduled follow-up with cardiology and no further cardiac evaluation needed prior to the dental procedure, which ever when she gets.  Per Dr. Debara Pickett, it is okay to hold the aspirin and Plavix up to 5 days for the procedure.  Therefore, based on ACC/AHA guidelines, the patient would be at acceptable risk for the planned procedure without further cardiovascular testing.   I will route this recommendation to the requesting party via Epic fax function and remove from pre-op pool.  Please call with questions.  Rosaria Ferries, PA-C 04/01/2018, 2:09 PM

## 2018-04-01 NOTE — Telephone Encounter (Signed)
Primary Cardiologist: [facility] Pre-operative Risk Assessment    Request for surgical clearance:  1. What type of surgery is being performed? Dental Crown   2. When is this surgery scheduled? TBD   3. What type of clearance is required (medical clearance vs. Pharmacy clearance to hold med vs. Both)? Both  4. Are there any medications that need to be held prior to surgery and how long? Not listed   5. Practice name and name of physician performing surgery? [facility] (Dr.Irving)  6. What is your office phone number [phone]    7.   What is your office fax number [phone]  8.   Anesthesia type (None, local, MAC, general) ? Local (Carbocaine 3%) No epinephrine   Lamar Laundry 04/01/2018, 12:39 PM  _________________________________________________________________   (provider comments below)  DOD call received from [place]. The pt was seen yesterday for a routine cleaning. Dr.Irvin called our office to report that the pt was visibly sob. Pt spoke directly with a triage nurse. the pt stated that she was fine, she was rushing in from the rain and had climbed a hill to get to her appt on time. Pt was seen today for a consult for a crown or extraction. Dr.Irving called again to report that the pt was still SOB. She sts that she has seen the pt for years and has noticed a difference. Pt sts that she is ok and does not feel as though she needs to be seen by Cardiology. Adv Dr.Irvin I will fwd the clearance request to our pre-op pool. The provider will contact the pt to access symptoms prior to clearance being granted.

## 2018-04-01 NOTE — Telephone Encounter (Signed)
  1. What dental office are you calling from? [facility]   2. What is your office phone number? [phone]  3. What is your fax number?  4. What type of procedure is the patient having performed? crown  5. What date is procedure scheduled or is the patient there now? now  6. What is your question (ex. Antibiotics prior to procedure, holding medication-we need to know how long dentist wants pt to hold med)?   DOD nurse took call with Dr Laurena Bering

## 2018-04-07 DIAGNOSIS — I771 Stricture of artery: Secondary | ICD-10-CM | POA: Diagnosis not present

## 2018-04-07 DIAGNOSIS — R931 Abnormal findings on diagnostic imaging of heart and coronary circulation: Secondary | ICD-10-CM | POA: Diagnosis not present

## 2018-04-07 DIAGNOSIS — Z8673 Personal history of transient ischemic attack (TIA), and cerebral infarction without residual deficits: Secondary | ICD-10-CM | POA: Diagnosis not present

## 2018-04-07 DIAGNOSIS — I10 Essential (primary) hypertension: Secondary | ICD-10-CM | POA: Diagnosis not present

## 2018-04-14 DIAGNOSIS — I6601 Occlusion and stenosis of right middle cerebral artery: Secondary | ICD-10-CM | POA: Diagnosis not present

## 2018-04-14 DIAGNOSIS — Z8673 Personal history of transient ischemic attack (TIA), and cerebral infarction without residual deficits: Secondary | ICD-10-CM | POA: Diagnosis not present

## 2018-04-14 DIAGNOSIS — Z79899 Other long term (current) drug therapy: Secondary | ICD-10-CM | POA: Diagnosis not present

## 2018-04-14 DIAGNOSIS — M79652 Pain in left thigh: Secondary | ICD-10-CM | POA: Diagnosis not present

## 2018-04-14 DIAGNOSIS — H8111 Benign paroxysmal vertigo, right ear: Secondary | ICD-10-CM | POA: Diagnosis not present

## 2018-04-14 DIAGNOSIS — M5414 Radiculopathy, thoracic region: Secondary | ICD-10-CM | POA: Diagnosis not present

## 2018-04-15 DIAGNOSIS — E113293 Type 2 diabetes mellitus with mild nonproliferative diabetic retinopathy without macular edema, bilateral: Secondary | ICD-10-CM | POA: Diagnosis not present

## 2018-04-15 DIAGNOSIS — H5213 Myopia, bilateral: Secondary | ICD-10-CM | POA: Diagnosis not present

## 2018-04-15 DIAGNOSIS — Z961 Presence of intraocular lens: Secondary | ICD-10-CM | POA: Diagnosis not present

## 2018-04-15 DIAGNOSIS — H43813 Vitreous degeneration, bilateral: Secondary | ICD-10-CM | POA: Diagnosis not present

## 2018-04-20 ENCOUNTER — Telehealth: Payer: Self-pay | Admitting: Internal Medicine

## 2018-04-20 NOTE — Telephone Encounter (Signed)
Error no note needed °

## 2018-04-28 ENCOUNTER — Telehealth: Payer: Self-pay

## 2018-04-28 NOTE — Telephone Encounter (Signed)
   [facility] Pre-operative Risk Assessment    Request for surgical clearance:  1. What type of surgery is being performed? Molar extraction   2. When is this surgery scheduled? TBD   3. What type of clearance is required (medical clearance vs. Pharmacy clearance to hold med vs. Both)? Both  4. Are there any medications that need to be held prior to surgery and how long? Hold Plavix 3 to 5 days prior  5. Practice name and name of physician performing surgery? [facility]  6. What is your office phone number [phone]   7.   What is your office fax number [phone]  8.   Anesthesia type Local   Whitney Newton 04/28/2018, 6:10 PM  _________________________________________________________________   (provider comments below)

## 2018-04-30 ENCOUNTER — Telehealth: Payer: Self-pay | Admitting: Internal Medicine

## 2018-04-30 NOTE — Telephone Encounter (Signed)
Pt was cleared 04/01/18, I will fax that note. OK to hold Plavix 5 days pre op if needed.  Kerin Ransom PA-C 04/30/2018 2:45 PM

## 2018-04-30 NOTE — Telephone Encounter (Signed)
Faxed completed/signed Dance movement psychotherapist Net application to [phone] for 2020 assistance with Repatha. Patient has been approved by insurance for medication until 08/26/2018

## 2018-05-02 ENCOUNTER — Other Ambulatory Visit: Payer: Self-pay | Admitting: Internal Medicine

## 2018-05-06 DIAGNOSIS — H43813 Vitreous degeneration, bilateral: Secondary | ICD-10-CM | POA: Diagnosis not present

## 2018-05-06 DIAGNOSIS — H35371 Puckering of macula, right eye: Secondary | ICD-10-CM | POA: Diagnosis not present

## 2018-05-06 DIAGNOSIS — E113293 Type 2 diabetes mellitus with mild nonproliferative diabetic retinopathy without macular edema, bilateral: Secondary | ICD-10-CM | POA: Diagnosis not present

## 2018-05-19 NOTE — Telephone Encounter (Signed)
Received fax notice from [place] that patient has been approved for Repatha free of charge from 06/10/2018 - 06/10/2019

## 2018-05-22 ENCOUNTER — Ambulatory Visit (INDEPENDENT_AMBULATORY_CARE_PROVIDER_SITE_OTHER): Payer: Medicare Other | Admitting: Internal Medicine

## 2018-05-22 ENCOUNTER — Encounter: Payer: Self-pay | Admitting: Internal Medicine

## 2018-05-22 VITALS — BP 135/65 | HR 78 | Ht 62.5 in | Wt 206.0 lb

## 2018-05-22 DIAGNOSIS — I2510 Atherosclerotic heart disease of native coronary artery without angina pectoris: Secondary | ICD-10-CM

## 2018-05-22 DIAGNOSIS — Z789 Other specified health status: Secondary | ICD-10-CM | POA: Diagnosis not present

## 2018-05-22 DIAGNOSIS — E782 Mixed hyperlipidemia: Secondary | ICD-10-CM

## 2018-05-22 DIAGNOSIS — I200 Unstable angina: Secondary | ICD-10-CM

## 2018-05-22 NOTE — Progress Notes (Signed)
Cardiology Office Note   Date:  05/22/2018   ID:  Whitney Newton, DOB 01/08/1941, MRN [id number]  PCP:  Aurea Graff.Marlou Sa, MD   CC: Follow-up Repatha  History of Present Illness: Whitney Newton is a 77 y.o. female who presents for  Four-month follow-up visit  This pleasant 77 year old woman is seen for a scheduled followup visit. The patient has a history of having had a stroke in early December 2014. She was at her diabetes clinic at [facility] and they diagnosed her and sent her straight to neurology where she was hospitalized for 3 days. The MRI showed a new stroke on the right side. She had carotid Dopplers which did not show any obstructive lesions but she did have some plaque and they put her back on aspirin and continued her Plavix. The patient had a echocardiogram which was a bubble study and did not show any evidence of right-to-left shunt. More recently, she has had further neurology workup at [facility] on 06/20/14. She had a transcranial Doppler. 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 Previous carotid Dopplers had not shown any significant external cranial disease She has a history of essential hypertension, diabetes mellitus, and dyslipidemia. She has had atypical chest pain. She is intolerant of statin drugs. We updated her lexiscan Myoview stress test on 03/17/12 and it was normal showing no evidence of ischemia and her ejection fraction was 78%. The patient has never had a cardiac catheterization. She has continued to have some shortness of breath. She had an echocardiogram in 04/07/12 which showed an ejection fraction of 55-65% with grade 1 diastolic dysfunction. There is trivial aortic insufficiency.  The patient has a history of essential hypertension as well as atypical chest pain. Her diabetes is now being followed at  the [facility] at [facility]. Since we last saw her she had an ophthalmology evaluation on 07/27/14 by Dr. Kathrin Penner and no diabetic retinopathy was found. Her last chest x-ray was on 12/21/12 elsewhere and showed a normal heart size and low lung volumes. She had a recent CT scan of the chest on 03/23/13 at [facility] which showed normal heart size and demonstrated atherosclerosis of the aorta.  The patient has a past history of dermatofibrosarcoma of the spine. She had surgery for this at [facility]. She also has a past history of angiolipoma of the abdominal wall.  Since last visit she has not been experiencing any new cardiac symptoms. She does have sleep apnea. She uses a CPAP machine. She has had some low back pain and pain in her hip radiating to her knees. She has seen Dr. Durward Fortes. She was admitted to the hospital on 01/18/15 for chest and abdominal pain. She had a CTA of the chest on 01/18/15 which was negative for pulmonary embolus. The following day she had a Myoview on 01/19/15 which showed no ischemia and her ejection fraction was 85%. She had a lot of side effects from the [facility]. It was felt in retrospect that her presenting complaints were probably GI in origin rather than cardiac. She does have a history of GERD and sees Dr. Oletta Lamas.  since last visit she has had no new cardiac symptoms.  She has not been having any recent chest discomfort. Her weight is unchanged and her blood pressures been stable.  11/22/2015  Whitney Newton presents today to  establish care with me. She is a previous patient of Dr. Warren Danes. She recently was at the beach and developed some worsening shortness of breath and leg swelling. Her nephew was there who came down from [place] with a head cold and she seems to have symptoms now at this time. She was also seen at an urgent care there and placed on amoxicillin. She's taken that for a few days. She notes significant  swelling in her weight is much higher than it had been in the past. She endorses a high salt intake. Her last echocardiogram was in 2013 which showed an EF of 55-65% with grade 1 diastolic dysfunction. She had a stress test in 2016 for chest pain which showed an EF of 85%.  12/25/2015  I saw Whitney Newton that today in the office. She has been taking the Lasix 40 mg daily which was over a week and this resulted in significant diuresis. Her weight had reduced from 202 down to 189. She then had to stop it for a few days because of some other medical problems are currently weight is up to 194. Her BNP associate with this was very low at 8 and renal function appeared to be stable. She reports a 75% improvement in breathing. Her repeat echo shows that EF remains stable with grade 1 diastolic dysfunction.  03/13/2016  Whitney Newton reports she's had a marked improvement in her swelling and breathing with additional diuretics. Weight has been fairly stable. She reports one episode of chest pain which was responsive to nitroglycerin. She's also had some reflux symptoms and is currently on Zegerid which appears to be helping her symptoms. Is not clear whether these episodes of chest discomfort are related to reflux or coronary ischemia. She did have a negative Myoview stress test in August 2016.  08/29/2016  Whitney Newton was seen today in the office. Over the past several day she's had worsening shortness of breath and lower extremity swelling. Her weight is now up about 6 pounds from her previous office visit. She said over the auscultation is been taking Lasix more regularly. She's currently taking 40 mg daily but as previously taken that for 3 days and alternating with 20 mg. She notes some improvement today after a couple days of diuretics.  10/08/2016  Whitney Newton returns today for follow-up. She has diuresed several pounds with increased dose diuretics are per creatinine rose and therefore I decreased her  diuretic back to 40 mg daily. She says she feels like she is over dried out on that dose of diuretic. She occasionally gets some cramping. She's also had some labile blood sugars which she attributes to the diuretics. In addition she had a recent bowel obstruction which may been related to diuresis.  03/06/2017  Whitney Newton was seen today in follow-up. She recently got back from a trip to assess catch one with her husband. She said they were traveling for about 16 days and she did not take her Lasix during that period of time. Her weight is up about 6 pounds that she reports lower extremity swelling. She has a sliding scale Lasix to take but has not been compliant with that.  08/20/2017  Whitney Newton returns today for follow-up.  She has been having a lot of symptoms recently.  She is complaining of pain across her abdomen.  This is been further assessed and there is a suggestion it could be related to back surgery.  She was also found to have mass on the  liver and pancreas, thought to be hemangioma.  She is scheduled to have a CT scan of her abdomen next week.  She continues to gain weight.  Her weight is up 6 pounds since I saw her in September.  Which was 203 at the time.  Her weight had been as low as 195 last year.  She does report lower extremity edema and some worsening shortness of breath however she relates the shortness of breath more to her pain.  She had been on higher dose Lasix in the past but was noted to have some elevation in creatinine.  We did reduce the dose and she has been compliant taking 20 mg daily.  02/05/2018  Whitney Newton returns for follow-up today.  Again she has numerous complaints however she is recently been having worsening chest discomfort.  She describes it as a squeezing or pressure in the center of her chest sometimes radiates up her neck.  May be associated with worsening shortness of breath.  Despite this she is taken extra Lasix and her weight is come down to her baseline of 203.  She has  not had symptomatic improvement.  She was scheduled to see me in a few weeks but got an earlier appointment because of her chest discomfort.  Sometimes her symptoms are associated with exertion and relieved by rest at other times can be present at rest or when awakening.  02/23/2018  Whitney Newton was seen today in follow-up of heart cath.  She underwent left heart catheterization on 02/11/2018.  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  LVEF was 65% with normal LVEDP 18 mmHg.  Post cath she still reports shortness of breath with certain activities.  She did have recent lipid testing which was as follows: LDL 148, Total chol 225, HDL 54, Non-HDL 171, TG 178.  Her goal LDL is less than 70 and she has not yet reach that target.  Unfortunately she has been statin intolerant and is a good candidate for PCSK9 inhibitor.  05/22/2018  Whitney Newton is seen today in follow-up.  She was started on Repatha which she seems to be tolerating well.  She has been on the medicine now for about 3 months and is getting it from free with support from [place].  Recently she had transcranial Dopplers at [facility] which actually shows some mild improvement in her intracranial atherosclerosis.  This could be related to her aggressive lipid therapy.  She was supposed to have repeat labs prior to this visit however it was not performed.  We will need to order a repeat lipid profile in order to reassess her therapy so far.  Past Medical History:  Diagnosis Date  . Acute diastolic (congestive) heart failure ([place]) 12/25/2015  . Back pain    prior back surgery in 2009 - slow to recover  . Cancer ([place])   . Diabetes ([place])   . Diverticulitis   . Gastritis   . Hemangioma of liver    managed conservatively; followed at [facility]  (hyperlipidemia)    statin intolerant  . Hypertension   . Normal cardiac stress test 2011  . Obesity   . Thyroid disease    hypothyroidism    Past Surgical History:  Procedure Laterality Date  . ABDOMINAL HYSTERECTOMY    . APPENDECTOMY    .  LEFT HEART CATH AND CORONARY ANGIOGRAPHY N/A 02/11/2018   Procedure: LEFT HEART CATH AND CORONARY ANGIOGRAPHY;  Surgeon: Troy Sine, MD;  Location: [facility];  Service: Cardiovascular;  Laterality: N/A;  . NASAL RECONSTRUCTION       Current Outpatient Medications  Medication Sig Dispense Refill  . aspirin 81 MG tablet Take 81 mg by mouth daily.    . bisoprolol-hydrochlorothiazide (ZIAC) 10-6.25 MG tablet TAKE (1) TABLET DAILY AS DIRECTED. 90 tablet 1  . clopidogrel (PLAVIX) 75 MG tablet TAKE 1 TABLET BY MOUTH DAILY. (Patient taking differently: Take 75 mg by mouth daily. ) 90 tablet 3  . colesevelam (WELCHOL) 625 MG tablet Take 3 tablets (1,875 mg total) by mouth 2 (two) times daily as needed (cholesterol). 180 tablet 6  . diclofenac sodium (VOLTAREN) 1 % GEL Apply 1 application topically at bedtime as needed (for pain).    . Evolocumab (REPATHA SURECLICK) 824 MG/ML SOAJ Inject 1 Dose into the skin every 14 (fourteen) days.     . furosemide (LASIX) 40 MG tablet TAKE 1 TABLET ONCE DAILY. (Patient taking differently: Take 20-40 mg by mouth See admin instructions. Take 20 mg by mouth daily. Take 40 mg by mouth daily if needed for fluid.) 90 tablet 1  . gabapentin (NEURONTIN) 100 MG capsule Take 200 mg by mouth at bedtime.     Marland Kitchen HUMALOG KWIKPEN 100 UNIT/ML KiwkPen Inject 9-14 Units into the skin See admin instructions. Inject 11 units SQ at breakfast, inject 14 units SQ at lunch and inject 16 units SQ at supper. Plus sliding scale 1 unit for every 25 BS is > 120    . hyoscyamine (LEVBID) 0.375 MG 12 hr tablet Take 0.375 mg by mouth daily.     . insulin glargine (LANTUS) 100 UNIT/ML injection Inject 24 Units into the skin every morning.       Marland Kitchen levothyroxine (SYNTHROID, LEVOTHROID) 88 MCG tablet Take 88 mcg by mouth daily.     . Liniments (SALONPAS EX) Apply 1 patch topically daily as needed (for pain).    Marland Kitchen losartan (COZAAR) 25 MG tablet Take 1 tablet (25 mg total) by mouth daily. 90 tablet 2  . nitroGLYCERIN (NITROSTAT) 0.4 MG SL tablet DISSOLVE ONE TABLET UNDER TONGUE AS NEEDED FOR ARM/CHEST PAIN. (Patient taking differently: Place 0.4 mg under the tongue every 5 (five) minutes as needed for chest pain. ) 25 tablet PRN  . potassium chloride SA (K-DUR,KLOR-CON) 20 MEQ tablet TAKE (2) TABLETS BY MOUTH DAILY. (Patient taking differently: Take 20-40 mEq by mouth See admin instructions. Take 20 meq by mouth daily. Take 40 meq by mouth daily when taking 40 mg lasix.) 180 tablet 0  . potassium chloride SA (K-DUR,KLOR-CON) 20 MEQ tablet TAKE (2) TABLETS BY MOUTH DAILY. 180 tablet 4   No current facility-administered medications for this visit.     Allergies:   Celecoxib; Lipitor [atorvastatin calcium]; Zetia [ezetimibe]; Ropinirole hcl; Actos [pioglitazone hydrochloride]; Crestor [rosuvastatin calcium]; Doxycycline; Fenofibrate; Imdur [isosorbide nitrate]; Metformin and related; Pravachol; Ranolazine er; and Niaspan [niacin er]    Social History:  The patient  reports that she has never smoked. She has never used smokeless tobacco. She reports that she does not drink alcohol or use drugs.   Family History:  The patient's family history includes Dementia in her father; Heart attack in her brother and father; Heart disease in her brother and father; Hypertension in her mother; Stroke in her mother.    ROS:   Pertinent items noted  in HPI and remainder of comprehensive ROS otherwise negative.  PHYSICAL EXAM: VS:  BP 135/65   Pulse 78   Ht 5' 2.5" (1.588 m)   Wt 206 lb (93.4 kg)   BMI 37.08 kg/m  , BMI Body mass index is 37.08 kg/m. General appearance: alert, no distress and moderately obese Neck: no carotid bruit and no JVD Lungs:  diminished breath sounds bilaterally Heart: regular rate and rhythm Abdomen: soft, non-tender; bowel sounds normal; no masses,  no organomegaly Extremities: edema 1+ pedal edema Pulses: 2+ and symmetric Skin: Skin color, texture, turgor normal. No rashes or lesions Neurologic: Grossly normal Psych: Mildly anxious  EKG:   Normal sinus rhythm 76, incomplete right bundle branch block with inferior and lateral infarct patterns at 76-personally reviewed  Recent Labs: 02/10/2018: BUN 18; Creatinine, Ser 0.84; Hemoglobin 15.1; Platelets 289; Potassium 5.2; Sodium 140    Lipid Panel    Component Value Date/Time   CHOL 209 (H) 11/15/2015 0954   TRIG 102 11/15/2015 0954   HDL 55 11/15/2015 0954   CHOLHDL 3.8 11/15/2015 0954   VLDL 20 11/15/2015 0954   LDLCALC 134 (H) 11/15/2015 0954   LDLDIRECT 148.7 09/14/2012 0857      Wt Readings from Last 3 Encounters:  05/22/18 206 lb (93.4 kg)  02/23/18 206 lb (93.4 kg)  02/11/18 203 lb 0.7 oz (92.1 kg)     ASSESSMENT AND PLAN:  1. Acute diastolic congestive heart failure 2. Hypertensive heart disease without heart failure 3. Diabetes mellitus - on insulin 4. Atypical chest pain with normal Myoview stress test in August 2016. No ischemia. Ejection fraction 85%. 3. old CVA followed at [facility] 6. History of dermatofibro- sarcoma of the spine. 7. Dyslipidemia 8. Osteoarthritis 9. OSA on CPAP [facility]) 10. Hypothyroidism followed at [facility]. 11. GERD, followed by Dr. Oletta Lamas  12. RBBB   PLAN:  Whitney Newton seems to be tolerating Repatha well.  We will repeat a lipid profile and continue on this.  Her goal LDL is less than 70.  There may be some evidence on transcranial Doppler according to her at [facility] indicating that she may have had some plaque regression with regards to intracranial atherosclerosis.  Plan follow-up with me in 6 months or sooner as necessary.  Pixie Casino, MD, [facility], [place] Director of the Advanced Lipid Disorders &  Cardiovascular Risk Reduction Clinic Diplomate of the American Board of Clinical Lipidology Attending Cardiologist  Direct Dial: [phone]  Fax: [phone]  Website:  [email or web address]   05/22/2018 11:11 AM

## 2018-05-22 NOTE — Patient Instructions (Signed)
Medication Instructions:  Continue current medications If you need a refill on your cardiac medications before your next appointment, please call your pharmacy.   Lab work: FASTING lab work to check cholesterol If you have labs (blood work) drawn today and your tests are completely normal, you will receive your results only by: Marland Kitchen MyChart Message (if you have MyChart) OR . A paper copy in the mail If you have any lab test that is abnormal or we need to change your treatment, we will call you to review the results.   Follow-Up: At [facility], you and your health needs are our priority.  As part of our continuing mission to provide you with exceptional heart care, we have created designated Provider Care Teams.  These Care Teams include your primary Cardiologist (physician) and Advanced Practice Providers (APPs -  Physician Assistants and Nurse Practitioners) who all work together to provide you with the care you need, when you need it. You will need a follow up appointment in 6 months.  Please call our office 2 months in advance to schedule this appointment.  You may see Pixie Casino, MD or one of the following Advanced Practice Providers on your designated Care Team: [place], [place] . Fabian Sharp, PA-C  Any Other Special Instructions Will Be Listed Below (If Applicable).

## 2018-05-25 DIAGNOSIS — D1730 Benign lipomatous neoplasm of skin and subcutaneous tissue of unspecified sites: Secondary | ICD-10-CM | POA: Diagnosis not present

## 2018-05-25 DIAGNOSIS — C4499 Other specified malignant neoplasm of skin, unspecified: Secondary | ICD-10-CM | POA: Diagnosis not present

## 2018-05-25 DIAGNOSIS — D179 Benign lipomatous neoplasm, unspecified: Secondary | ICD-10-CM | POA: Diagnosis not present

## 2018-05-25 DIAGNOSIS — L821 Other seborrheic keratosis: Secondary | ICD-10-CM | POA: Diagnosis not present

## 2018-05-25 DIAGNOSIS — L853 Xerosis cutis: Secondary | ICD-10-CM | POA: Diagnosis not present

## 2018-05-25 DIAGNOSIS — Z85828 Personal history of other malignant neoplasm of skin: Secondary | ICD-10-CM | POA: Diagnosis not present

## 2018-05-26 DIAGNOSIS — E785 Hyperlipidemia, unspecified: Secondary | ICD-10-CM | POA: Diagnosis not present

## 2018-05-26 LAB — LIPID PANEL
CHOL/HDL RATIO: 1.7 ratio (ref 0.0–4.4)
Cholesterol, Total: 104 mg/dL (ref 100–199)
HDL: 62 mg/dL (ref >39)
LDL Calculated: 26 mg/dL (ref 0–99)
Triglycerides: 81 mg/dL (ref 0–149)
VLDL Cholesterol Cal: 16 mg/dL (ref 5–40)

## 2018-07-29 ENCOUNTER — Other Ambulatory Visit: Payer: Self-pay | Admitting: Cardiovascular Disease

## 2018-08-10 ENCOUNTER — Encounter: Payer: Self-pay | Admitting: Internal Medicine

## 2018-08-10 ENCOUNTER — Ambulatory Visit (INDEPENDENT_AMBULATORY_CARE_PROVIDER_SITE_OTHER): Payer: Medicare Other | Admitting: Internal Medicine

## 2018-08-10 VITALS — BP 152/64 | HR 63 | Ht 62.0 in | Wt 208.2 lb

## 2018-08-10 DIAGNOSIS — E0800 Diabetes mellitus due to underlying condition with hyperosmolarity without nonketotic hyperglycemic-hyperosmolar coma (NKHHC): Secondary | ICD-10-CM

## 2018-08-10 DIAGNOSIS — I2510 Atherosclerotic heart disease of native coronary artery without angina pectoris: Secondary | ICD-10-CM | POA: Diagnosis not present

## 2018-08-10 DIAGNOSIS — I4510 Unspecified right bundle-branch block: Secondary | ICD-10-CM

## 2018-08-10 DIAGNOSIS — E782 Mixed hyperlipidemia: Secondary | ICD-10-CM

## 2018-08-10 DIAGNOSIS — Z794 Long term (current) use of insulin: Secondary | ICD-10-CM | POA: Diagnosis not present

## 2018-08-10 DIAGNOSIS — Z789 Other specified health status: Secondary | ICD-10-CM

## 2018-08-10 NOTE — Progress Notes (Signed)
Cardiology Office Note   Date:  08/10/2018   ID:  Whitney Newton, DOB 19-Mar-1941, MRN [id number]  PCP:  Aurea Graff.Marlou Sa, MD   CC: Follow-up Repatha  History of Present Illness: Whitney Newton is a 78 y.o. female who presents for  Four-month follow-up visit  This pleasant 78 year old woman is seen for a scheduled followup visit. The patient has a history of having had a stroke in early December 2014. She was at her diabetes clinic at [facility] and they diagnosed her and sent her straight to neurology where she was hospitalized for 3 days. The MRI showed a new stroke on the right side. She had carotid Dopplers which did not show any obstructive lesions but she did have some plaque and they put her back on aspirin and continued her Plavix. The patient had a echocardiogram which was a bubble study and did not show any evidence of right-to-left shunt. More recently, she has had further neurology workup at [facility] on 06/20/14. She had a transcranial Doppler. 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 Previous carotid Dopplers had not shown any significant external cranial disease She has a history of essential hypertension, diabetes mellitus, and dyslipidemia. She has had atypical chest pain. She is intolerant of statin drugs. We updated her lexiscan Myoview stress test on 03/17/12 and it was normal showing no evidence of ischemia and her ejection fraction was 78%. The patient has never had a cardiac catheterization. She has continued to have some shortness of breath. She had an echocardiogram in 04/07/12 which showed an ejection fraction of 55-65% with grade 1 diastolic dysfunction. There is trivial aortic insufficiency.  The patient has a history of essential hypertension as well as atypical chest pain. Her diabetes is now being followed at the  [facility] clinic at [facility]. Since we last saw her she had an ophthalmology evaluation on 07/27/14 by Dr. Kathrin Penner and no diabetic retinopathy was found. Her last chest x-ray was on 12/21/12 elsewhere and showed a normal heart size and low lung volumes. She had a recent CT scan of the chest on 03/23/13 at [facility] which showed normal heart size and demonstrated atherosclerosis of the aorta.  The patient has a past history of dermatofibrosarcoma of the spine. She had surgery for this at [facility]. She also has a past history of angiolipoma of the abdominal wall.  Since last visit she has not been experiencing any new cardiac symptoms. She does have sleep apnea. She uses a CPAP machine. She has had some low back pain and pain in her hip radiating to her knees. She has seen Dr. Durward Fortes. She was admitted to the hospital on 01/18/15 for chest and abdominal pain. She had a CTA of the chest on 01/18/15 which was negative for pulmonary embolus. The following day she had a Myoview on 01/19/15 which showed no ischemia and her ejection fraction was 85%. She had a lot of side effects from the [facility]. It was felt in retrospect that her presenting complaints were probably GI in origin rather than cardiac. She does have a history of GERD and sees Dr. Oletta Lamas.  since last visit she has had no new cardiac symptoms.  She has not been having any recent chest discomfort. Her weight is unchanged and her blood pressures been stable.  11/22/2015  Whitney Newton presents today to  establish care with me. She is a previous patient of Dr. Warren Danes. She recently was at the beach and developed some worsening shortness of breath and leg swelling. Her nephew was there who came down from [place] with a head cold and she seems to have symptoms now at this time. She was also seen at an urgent care there and placed on amoxicillin. She's taken that for a few days. She notes significant swelling  in her weight is much higher than it had been in the past. She endorses a high salt intake. Her last echocardiogram was in 2013 which showed an EF of 55-65% with grade 1 diastolic dysfunction. She had a stress test in 2016 for chest pain which showed an EF of 85%.  12/25/2015  I saw Whitney Newton that today in the office. She has been taking the Lasix 40 mg daily which was over a week and this resulted in significant diuresis. Her weight had reduced from 202 down to 189. She then had to stop it for a few days because of some other medical problems are currently weight is up to 194. Her BNP associate with this was very low at 8 and renal function appeared to be stable. She reports a 75% improvement in breathing. Her repeat echo shows that EF remains stable with grade 1 diastolic dysfunction.  03/13/2016  Whitney Newton reports she's had a marked improvement in her swelling and breathing with additional diuretics. Weight has been fairly stable. She reports one episode of chest pain which was responsive to nitroglycerin. She's also had some reflux symptoms and is currently on Zegerid which appears to be helping her symptoms. Is not clear whether these episodes of chest discomfort are related to reflux or coronary ischemia. She did have a negative Myoview stress test in August 2016.  08/29/2016  Whitney Newton was seen today in the office. Over the past several day she's had worsening shortness of breath and lower extremity swelling. Her weight is now up about 6 pounds from her previous office visit. She said over the auscultation is been taking Lasix more regularly. She's currently taking 40 mg daily but as previously taken that for 3 days and alternating with 20 mg. She notes some improvement today after a couple days of diuretics.  10/08/2016  Whitney Newton returns today for follow-up. She has diuresed several pounds with increased dose diuretics are per creatinine rose and therefore I decreased her diuretic back  to 40 mg daily. She says she feels like she is over dried out on that dose of diuretic. She occasionally gets some cramping. She's also had some labile blood sugars which she attributes to the diuretics. In addition she had a recent bowel obstruction which may been related to diuresis.  03/06/2017  Whitney Newton was seen today in follow-up. She recently got back from a trip to assess catch one with her husband. She said they were traveling for about 16 days and she did not take her Lasix during that period of time. Her weight is up about 6 pounds that she reports lower extremity swelling. She has a sliding scale Lasix to take but has not been compliant with that.  08/20/2017  Whitney Newton returns today for follow-up.  She has been having a lot of symptoms recently.  She is complaining of pain across her abdomen.  This is been further assessed and there is a suggestion it could be related to back surgery.  She was also found to have mass on the  liver and pancreas, thought to be hemangioma.  She is scheduled to have a CT scan of her abdomen next week.  She continues to gain weight.  Her weight is up 6 pounds since I saw her in September.  Which was 203 at the time.  Her weight had been as low as 195 last year.  She does report lower extremity edema and some worsening shortness of breath however she relates the shortness of breath more to her pain.  She had been on higher dose Lasix in the past but was noted to have some elevation in creatinine.  We did reduce the dose and she has been compliant taking 20 mg daily.  02/05/2018  Whitney Newton returns for follow-up today.  Again she has numerous complaints however she is recently been having worsening chest discomfort.  She describes it as a squeezing or pressure in the center of her chest sometimes radiates up her neck.  May be associated with worsening shortness of breath.  Despite this she is taken extra Lasix and her weight is come down to her baseline of 203.  She has not had  symptomatic improvement.  She was scheduled to see me in a few weeks but got an earlier appointment because of her chest discomfort.  Sometimes her symptoms are associated with exertion and relieved by rest at other times can be present at rest or when awakening.  02/23/2018  Whitney Newton was seen today in follow-up of heart cath.  She underwent left heart catheterization on 02/11/2018.  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  LVEF was 65% with normal LVEDP 18 mmHg.  Post cath she still reports shortness of breath with certain activities.  She did have recent lipid testing which was as follows: LDL 148, Total chol 225, HDL 54, Non-HDL 171, TG 178.  Her goal LDL is less than 70 and she has not yet reach that target.  Unfortunately she has been statin intolerant and is a good candidate for PCSK9 inhibitor.  05/22/2018  Whitney Newton is seen today in follow-up.  She was started on Repatha which she seems to be tolerating well.  She has been on the medicine now for about 3 months and is getting it from free with support from [place].  Recently she had transcranial Dopplers at [facility] which actually shows some mild improvement in her intracranial atherosclerosis.  This could be related to her aggressive lipid therapy.  She was supposed to have repeat labs prior to this visit however it was not performed.  We will need to order a repeat lipid profile in order to reassess her therapy so far.  08/10/2018  Whitney Newton returns today for follow-up.  She has been doing well with Repatha although has had several injections which have possibly failed.  She seems to be injecting on the anterior thigh and has had some bleeding and bruising.  She also has trouble depressing the injector and noted some leakage of the medication.  I did  perform some extra training with her today and helped describe to her a better way to hold the pen as well as a better injection site which I think should improve her drug delivery.  That being said in December, her total cholesterol come down significantly to 104, with HDL 62, LDL 26 and triglycerides of 81.  Hemoglobin A1c remains persistently elevated at 8.2.  Past Medical History:  Diagnosis Date  . Acute diastolic (congestive) heart failure ([place]) 12/25/2015  . Back pain    prior back surgery in 2009 - slow to recover  . Cancer ([place])   . Diabetes ([place])   . Diverticulitis   . Gastritis   . Hemangioma of liver    managed conservatively; followed at [facility] (hyperlipidemia)    statin intolerant  . Hypertension   . Normal cardiac stress test 2011  . Obesity   . Thyroid disease    hypothyroidism    Past Surgical History:  Procedure Laterality Date  . ABDOMINAL HYSTERECTOMY    . APPENDECTOMY    . LEFT HEART CATH AND CORONARY ANGIOGRAPHY N/A 02/11/2018   Procedure: LEFT HEART CATH AND CORONARY ANGIOGRAPHY;  Surgeon: Troy Sine, MD;  Location: [facility];  Service: Cardiovascular;  Laterality: N/A;  . NASAL RECONSTRUCTION       Current Outpatient Medications  Medication Sig Dispense Refill  . aspirin 81 MG tablet Take 81 mg by mouth daily.    . bisoprolol-hydrochlorothiazide (ZIAC) 10-6.25 MG tablet TAKE (1) TABLET DAILY AS DIRECTED. 90 tablet 1  . clopidogrel (PLAVIX) 75 MG tablet TAKE 1 TABLET BY MOUTH DAILY. (Patient taking differently: Take 75 mg by mouth daily. ) 90 tablet 3  . colesevelam (WELCHOL) 625 MG tablet Take 3 tablets (1,875 mg total) by mouth 2 (two) times daily as needed (cholesterol). 180 tablet 6  . diclofenac sodium (VOLTAREN) 1 % GEL Apply 1 application topically at bedtime as needed (for pain).    . Evolocumab (REPATHA SURECLICK) 518 MG/ML SOAJ Inject 1 Dose into the skin every 14 (fourteen) days.     . furosemide (LASIX) 40 MG tablet TAKE 1  TABLET ONCE DAILY. (Patient taking differently: Take 20-40 mg by mouth See admin instructions. Take 20 mg by mouth daily. Take 40 mg by mouth daily if needed for fluid.) 90 tablet 1  . gabapentin (NEURONTIN) 100 MG capsule Take 200 mg by mouth at bedtime.     Marland Kitchen HUMALOG KWIKPEN 100 UNIT/ML KiwkPen Inject 9-14 Units into the skin See admin instructions. Inject 11 units SQ at breakfast, inject 14 units SQ at lunch and inject 16 units SQ at supper. Plus sliding scale 1 unit for every 25 BS is > 120    . hyoscyamine (LEVBID) 0.375 MG 12 hr tablet Take 0.375 mg by mouth daily.     . insulin glargine (LANTUS) 100 UNIT/ML injection Inject 24 Units into the skin every morning.     Marland Kitchen levothyroxine (SYNTHROID, LEVOTHROID) 88 MCG tablet Take 88 mcg by mouth daily.     . Liniments (SALONPAS EX) Apply 1 patch topically daily as needed (for pain).    Marland Kitchen losartan (COZAAR) 25 MG tablet TAKE 1 TABLET ONCE DAILY. 90 tablet 3  . nitroGLYCERIN (NITROSTAT) 0.4 MG SL tablet DISSOLVE ONE TABLET UNDER TONGUE AS NEEDED FOR ARM/CHEST PAIN. (Patient taking differently: Place 0.4 mg under the tongue every 5 (five) minutes as needed for chest pain. ) 25 tablet PRN  . potassium chloride SA (K-DUR,KLOR-CON) 20 MEQ tablet TAKE (2) TABLETS BY MOUTH DAILY. 180 tablet 4   No current facility-administered medications for this visit.     Allergies:   Celecoxib; Lipitor [atorvastatin calcium]; Zetia [ezetimibe]; Ropinirole hcl; Actos [pioglitazone hydrochloride]; Crestor [rosuvastatin calcium]; Doxycycline; Fenofibrate; Imdur [isosorbide nitrate]; Metformin and related; Pravachol; Ranolazine er; Niaspan [niacin er]; and Ranolazine  Social History:  The patient  reports that she has never smoked. She has never used smokeless tobacco. She reports that she does not drink alcohol or use drugs.   Family History:  The patient's family history includes Dementia in her father; Heart attack in her brother and father; Heart disease in her  brother and father; Hypertension in her mother; Stroke in her mother.    ROS:   Pertinent items noted in HPI and remainder of comprehensive ROS otherwise negative.  PHYSICAL EXAM: VS:  BP (!) 152/64   Pulse 63   Ht 5\' 2"  (1.575 m)   Wt 208 lb 3.2 oz (94.4 kg)   SpO2 97%   BMI 38.08 kg/m  , BMI Body mass index is 38.08 kg/m. General appearance: alert, no distress and moderately obese Neck: no carotid bruit and no JVD Lungs: diminished breath sounds bilaterally Heart: regular rate and rhythm Abdomen: soft, non-tender; bowel sounds normal; no masses,  no organomegaly Extremities: edema 1+ pedal edema Pulses: 2+ and symmetric Skin: Skin color, texture, turgor normal. No rashes or lesions Neurologic: Grossly normal Psych: Mildly anxious  EKG:   Deferred  Recent Labs: 02/10/2018: BUN 18; Creatinine, Ser 0.84; Hemoglobin 15.1; Platelets 289; Potassium 5.2; Sodium 140    Lipid Panel    Component Value Date/Time   CHOL 104 05/26/2018 1050   TRIG 81 05/26/2018 1050   HDL 62 05/26/2018 1050   CHOLHDL 1.7 05/26/2018 1050   CHOLHDL 3.8 11/15/2015 0954   VLDL 20 11/15/2015 0954   LDLCALC 26 05/26/2018 1050   LDLDIRECT 148.7 09/14/2012 0857      Wt Readings from Last 3 Encounters:  08/10/18 208 lb 3.2 oz (94.4 kg)  05/22/18 206 lb (93.4 kg)  02/23/18 206 lb (93.4 kg)     ASSESSMENT AND PLAN:  1. Acute diastolic congestive heart failure 2. Hypertensive heart disease without heart failure 3. Diabetes mellitus - on insulin 4. Atypical chest pain with normal Myoview stress test in August 2016. No ischemia. Ejection fraction 85%. 43. old CVA followed at [facility] 6. History of dermatofibro- sarcoma of the spine. 7. Dyslipidemia 8. Osteoarthritis 9. OSA on CPAP [facility]) 10. Hypothyroidism followed at [facility]. 11. GERD, followed by Dr. Oletta Lamas  12. RBBB   PLAN:  Mrs. Gibbs is doing well with Repatha.  I gave her some additional teaching today and I  think she will be even more effective at controlling her cholesterol.  No other medications were changed today.  She has no signs or symptoms of worsening heart failure.  Her heat A1c remains above target and she is on Lantus insulin.  I have encouraged her to follow-up with her PCP to improve this as well as for her to work on dietary changes.  Follow-up 6 months.   Pixie Casino, MD, [facility], [place] Director of the Advanced Lipid Disorders &  Cardiovascular Risk Reduction Clinic Diplomate of the American Board of Clinical Lipidology Attending Cardiologist  Direct Dial: [phone]  Fax: [phone]  Website:  [email or web address]   08/10/2018 9:25 AM

## 2018-08-10 NOTE — Patient Instructions (Signed)
Medication Instructions:  Your Physician recommend you continue on your current medication as directed.    If you need a refill on your cardiac medications before your next appointment, please call your pharmacy.   Lab work: None  Testing/Procedures: None  Follow-Up: At [facility], you and your health needs are our priority.  As part of our continuing mission to provide you with exceptional heart care, we have created designated Provider Care Teams.  These Care Teams include your primary Cardiologist (physician) and Advanced Practice Providers (APPs -  Physician Assistants and Nurse Practitioners) who all work together to provide you with the care you need, when you need it. You will need a follow up appointment in 6 months.  Please call our office 2 months in advance to schedule this appointment.  You may see Kenneth C Hilty, MD or one of the following Advanced Practice Providers on your designated Care Team: Hao Meng, PA-C . Angela Duke, PA-C     

## 2018-08-18 DIAGNOSIS — Z794 Long term (current) use of insulin: Secondary | ICD-10-CM | POA: Diagnosis not present

## 2018-08-18 DIAGNOSIS — E1165 Type 2 diabetes mellitus with hyperglycemia: Secondary | ICD-10-CM | POA: Diagnosis not present

## 2018-08-18 DIAGNOSIS — I10 Essential (primary) hypertension: Secondary | ICD-10-CM | POA: Diagnosis not present

## 2018-08-18 DIAGNOSIS — Z79899 Other long term (current) drug therapy: Secondary | ICD-10-CM | POA: Diagnosis not present

## 2018-08-18 DIAGNOSIS — E785 Hyperlipidemia, unspecified: Secondary | ICD-10-CM | POA: Diagnosis not present

## 2018-08-18 DIAGNOSIS — E039 Hypothyroidism, unspecified: Secondary | ICD-10-CM | POA: Diagnosis not present

## 2018-08-21 ENCOUNTER — Other Ambulatory Visit: Payer: Self-pay | Admitting: Internal Medicine

## 2018-08-25 ENCOUNTER — Other Ambulatory Visit: Payer: Self-pay | Admitting: Internal Medicine

## 2018-08-26 ENCOUNTER — Telehealth: Payer: Self-pay

## 2018-08-26 NOTE — Telephone Encounter (Signed)
Called to let the pt know that the pa for the repatha was approved and I asked if they needed a refill and they stated that they get pt assistance and their pt assistance stated that they are good until the end of the year

## 2018-10-23 ENCOUNTER — Other Ambulatory Visit: Payer: Self-pay | Admitting: Internal Medicine

## 2018-10-23 NOTE — Telephone Encounter (Signed)
Clopidogrel and generic ziac refilled.

## 2018-11-24 DIAGNOSIS — Z85828 Personal history of other malignant neoplasm of skin: Secondary | ICD-10-CM | POA: Diagnosis not present

## 2018-11-24 DIAGNOSIS — Z872 Personal history of diseases of the skin and subcutaneous tissue: Secondary | ICD-10-CM | POA: Diagnosis not present

## 2018-11-24 DIAGNOSIS — L304 Erythema intertrigo: Secondary | ICD-10-CM | POA: Diagnosis not present

## 2018-11-24 DIAGNOSIS — L853 Xerosis cutis: Secondary | ICD-10-CM | POA: Diagnosis not present

## 2018-11-24 DIAGNOSIS — D1722 Benign lipomatous neoplasm of skin and subcutaneous tissue of left arm: Secondary | ICD-10-CM | POA: Diagnosis not present

## 2018-11-24 DIAGNOSIS — D1730 Benign lipomatous neoplasm of skin and subcutaneous tissue of unspecified sites: Secondary | ICD-10-CM | POA: Diagnosis not present

## 2018-11-24 DIAGNOSIS — L821 Other seborrheic keratosis: Secondary | ICD-10-CM | POA: Diagnosis not present

## 2018-12-17 ENCOUNTER — Other Ambulatory Visit: Payer: Self-pay

## 2018-12-17 ENCOUNTER — Ambulatory Visit (INDEPENDENT_AMBULATORY_CARE_PROVIDER_SITE_OTHER): Payer: Medicare Other | Admitting: Orthopaedic Surgery

## 2018-12-17 ENCOUNTER — Ambulatory Visit (INDEPENDENT_AMBULATORY_CARE_PROVIDER_SITE_OTHER): Payer: Medicare Other

## 2018-12-17 ENCOUNTER — Encounter: Payer: Self-pay | Admitting: Orthopaedic Surgery

## 2018-12-17 VITALS — BP 125/74 | HR 63 | Resp 14 | Ht 63.0 in | Wt 206.0 lb

## 2018-12-17 DIAGNOSIS — I2510 Atherosclerotic heart disease of native coronary artery without angina pectoris: Secondary | ICD-10-CM

## 2018-12-17 DIAGNOSIS — M25562 Pain in left knee: Secondary | ICD-10-CM | POA: Diagnosis not present

## 2018-12-17 NOTE — Progress Notes (Signed)
Office Visit Note   Patient: Whitney Newton           Date of Birth: 09-03-1940           MRN: [id number] Visit Date: 12/17/2018              Requested by: Whitney Newton, Whitney Newton, [place] [employer] [place],  [place] [id number] PCP: Whitney Newton, Whitney Sa, MD   Assessment & Plan: Visit Diagnoses:  1. Acute pain of left knee     Plan: Advanced osteoarthritis left knee.  Long discussion regarding diagnosis and treatment options.  Whitney Newton would like to continue with over-the-counter medicines and gels.  Will apply a spider knee brace for support.  Follow-Up Instructions: Return if symptoms worsen or fail to improve.   Orders:  Orders Placed This Encounter  Procedures  . XR KNEE 3 VIEW LEFT   No orders of the defined types were placed in this encounter.     Procedures: No procedures performed   Clinical Data: No additional findings.   Subjective: Chief Complaint  Patient presents with  . Left Knee - Pain  Whitney Newton has had a long history of left knee pain dating back to knee arthroscopy many years ago.  She does have some pain and stiffness but also a sensation of her knee giving way.  She notes that she is diabetic and needs to be careful with cortisone.  Also has been told that she needs to avoid anti-inflammatory medicines.  She would like to try a brace for her knee  HPI  Review of Systems  Constitutional: Negative for chills and fatigue.  HENT: Negative for sore throat and tinnitus.   Eyes: Negative for pain and redness.  Respiratory: Negative for wheezing.   Cardiovascular: Negative for chest pain.  Gastrointestinal: Negative for abdominal pain, constipation and diarrhea.  Endocrine: Negative for polyuria.  Genitourinary: Negative for pelvic pain.  Musculoskeletal: Negative for neck pain and neck stiffness.  Allergic/Immunologic: Negative for immunocompromised state.  Neurological: Negative for dizziness and headaches.  Hematological: Does not bruise/bleed easily.   Psychiatric/Behavioral: The patient is not nervous/anxious.      Objective: Vital Signs: BP 125/74 (BP Location: Right Arm, Patient Position: Sitting, Cuff Size: Normal)   Pulse 63   Resp 14   Ht 5\' 3"  (1.6 m)   Wt 206 lb (93.4 kg)   BMI 36.49 kg/m   Physical Exam Constitutional:      Appearance: She is well-developed.  Eyes:     Pupils: Pupils are equal, round, and reactive to light.  Pulmonary:     Effort: Pulmonary effort is normal.  Skin:    General: Skin is warm and dry.  Neurological:     Mental Status: She is alert and oriented to person, place, and time.  Psychiatric:        Behavior: Behavior normal.     Ortho Exam awake alert and oriented x3.  Comfortable sitting.  Full extension left knee with maybe a small effusion.  Large knees.  More medial than lateral joint pain.  Some patellar crepitation.  No instability.  Flexed but 95 degrees.  Some chronic edema both ankles  Specialty Comments:  No specialty comments available.  Imaging: Xr Knee 3 View Left  Result Date: 12/17/2018 Films of the left knee were obtained in 3 projections standing.  There is bone-on-bone in the medial compartment.  Normal alignment.  No ectopic calcification.  Patellofemoral arthritis with peripheral osteophytes.  Appears to track in the  midline.  No acute changes    PMFS History: Patient Active Problem List   Diagnosis Date Noted  . Acute pain of left knee 12/17/2018  . Unstable angina ([place])   . Benign paroxysmal positional vertigo of right ear 04/08/2017  . Meralgia paresthetica of both lower extremities 04/08/2017  . 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 Acute diastolic congestive heart failure ([place]) 12/25/2015  . Dyspnea 11/22/2015  . Adrenal adenoma 02/14/2015  . Arthritis 02/14/2015  . Diabetes ([place]) 02/14/2015  . Diabetes mellitus type 2, uncontrolled ([place]) 02/14/2015  . Essential (primary) hypertension 02/14/2015  . HLD (hyperlipidemia) 02/14/2015  . BP (high blood  pressure) 02/14/2015  . Disc disease with myelopathy, 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  . 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 Nerve root pain 02/14/2015  . Right hemisphere, 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[place]) 02/14/2015  . Cerebral vascular accident ([place]) 02/14/2015  . Benign essential HTN   . Esophageal reflux   . Hepatic hemangioma   . Acute kidney injury (nontraumatic) ([place]) 01/18/2015  . Diabetes mellitus type 2, controlled ([place]) 01/18/2015  . Pain in the chest   . Lumbar radiculopathy 12/02/2014  . RBBB 08/02/2014  . Cerebral atherosclerosis 12/08/2013  . Obstructive apnea 11/26/2013  . Cerebral infarct ([place]) 05/12/2013  . Atherosclerosis of aorta ([place]) 04/12/2013  . 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 Chest pain 04/12/2013  . Skin rash 01/15/2013  . 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/14/2012  . Fatty tumor 09/09/2012  . H/O malignant neoplasm of skin 09/09/2012  . CVA (cerebral vascular accident) ([place]) 07/31/2012  . Acquired aphasia 05/25/2012  . Neoplasm of uncertain behavior of skin 09/24/2011  . Bilateral edema of lower extremity 09/09/2011  . Diabetes mellitus, type 2 ([place]) 08/15/2011  . Combined hyperlipidemia associated with type 2 diabetes mellitus ([place]) 08/15/2011  . Carpal tunnel syndrome 08/07/2011  . Lesion, thoracic root 08/07/2011  . Cervical nerve root disorder 07/18/2011  . 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 Hypothyroidism 09/18/2010  . Benign hypertensive heart disease without heart failure 09/18/2010  . Dyslipidemia 09/18/2010  . Type II or unspecified type diabetes mellitus without mention of complication, uncontrolled 09/18/2010  . Hemangioma of liver 09/18/2010  . Postmenopausal state 09/18/2010  . Chest pain 09/18/2010  . Dizziness 09/18/2010   Past Medical History:  Diagnosis Date  . Acute diastolic (congestive) heart failure ([place]) 12/25/2015  . Back pain    prior back surgery in 2009 - slow to recover  .  Cancer ([place])   . Diabetes ([place])   . Diverticulitis   . Gastritis   . Hemangioma of liver    managed conservatively; followed at [facility] (hyperlipidemia)    statin intolerant  . Hypertension   . Normal cardiac stress test 2011  . Obesity   . Thyroid disease    hypothyroidism    Family History  Problem Relation Age of Onset  . Stroke Mother   . Hypertension Mother   . Heart disease Father   . Heart attack Father   . Dementia Father   . Heart disease Brother   . Heart attack Brother     Past Surgical History:  Procedure Laterality Date  . ABDOMINAL HYSTERECTOMY    . APPENDECTOMY    . LEFT HEART CATH AND CORONARY ANGIOGRAPHY N/A 02/11/2018   Procedure: LEFT HEART CATH AND CORONARY ANGIOGRAPHY;  Surgeon: Troy Sine, MD;  Location: [facility];  Service: Cardiovascular;  Laterality: N/A;  . NASAL RECONSTRUCTION  Social History   Occupational History  . Not on file  Tobacco Use  . Smoking status: Never Smoker  . Smokeless tobacco: Never Used  Substance and Sexual Activity  . Alcohol use: No  . Drug use: No  . Sexual activity: Never

## 2018-12-18 ENCOUNTER — Ambulatory Visit: Payer: Medicare Other | Admitting: Orthopaedic Surgery

## 2019-01-09 DIAGNOSIS — R079 Chest pain, unspecified: Secondary | ICD-10-CM | POA: Diagnosis not present

## 2019-01-09 DIAGNOSIS — R05 Cough: Secondary | ICD-10-CM | POA: Diagnosis not present

## 2019-01-09 DIAGNOSIS — R300 Dysuria: Secondary | ICD-10-CM | POA: Diagnosis not present

## 2019-01-09 DIAGNOSIS — M791 Myalgia, unspecified site: Secondary | ICD-10-CM | POA: Diagnosis not present

## 2019-01-22 ENCOUNTER — Telehealth: Payer: Self-pay | Admitting: Orthopaedic Surgery

## 2019-01-22 NOTE — Telephone Encounter (Signed)
Spoke with patient. She is getting great results with the brace. She said that the brace does get loose as she wears it. She tightens it as needed. She wants to come by the office next week to pick up a piece of stockinet to try under her brace, because the sleeve cuts into her thigh at times.

## 2019-01-22 NOTE — Telephone Encounter (Signed)
Patient called to let Dr. Durward Fortes know that the brace has helped with stability, but she is having a difficult time keeping it in place when she walks.  Please advise.

## 2019-02-04 DIAGNOSIS — R0781 Pleurodynia: Secondary | ICD-10-CM | POA: Diagnosis not present

## 2019-02-11 DIAGNOSIS — R0781 Pleurodynia: Secondary | ICD-10-CM | POA: Diagnosis not present

## 2019-02-19 DIAGNOSIS — E1165 Type 2 diabetes mellitus with hyperglycemia: Secondary | ICD-10-CM | POA: Diagnosis not present

## 2019-02-19 DIAGNOSIS — Z794 Long term (current) use of insulin: Secondary | ICD-10-CM | POA: Diagnosis not present

## 2019-03-12 DIAGNOSIS — Z23 Encounter for immunization: Secondary | ICD-10-CM | POA: Diagnosis not present

## 2019-03-30 DIAGNOSIS — G4733 Obstructive sleep apnea (adult) (pediatric): Secondary | ICD-10-CM | POA: Diagnosis not present

## 2019-03-30 DIAGNOSIS — Z79899 Other long term (current) drug therapy: Secondary | ICD-10-CM | POA: Diagnosis not present

## 2019-04-13 DIAGNOSIS — I771 Stricture of artery: Secondary | ICD-10-CM | POA: Diagnosis not present

## 2019-04-13 DIAGNOSIS — R2990 Unspecified symptoms and signs involving the nervous system: Secondary | ICD-10-CM | POA: Diagnosis not present

## 2019-04-13 DIAGNOSIS — R4189 Other symptoms and signs involving cognitive functions and awareness: Secondary | ICD-10-CM | POA: Diagnosis not present

## 2019-04-13 DIAGNOSIS — I6522 Occlusion and stenosis of left carotid artery: Secondary | ICD-10-CM | POA: Diagnosis not present

## 2019-04-14 ENCOUNTER — Telehealth: Payer: Self-pay | Admitting: Internal Medicine

## 2019-04-14 DIAGNOSIS — E782 Mixed hyperlipidemia: Secondary | ICD-10-CM

## 2019-04-14 NOTE — Telephone Encounter (Signed)
[employer] patient assistance application mailed with instruction to return & complete also mailed lab order with appt reminder

## 2019-04-16 ENCOUNTER — Ambulatory Visit (INDEPENDENT_AMBULATORY_CARE_PROVIDER_SITE_OTHER): Payer: Medicare Other | Admitting: Internal Medicine

## 2019-04-16 ENCOUNTER — Encounter: Payer: Self-pay | Admitting: Internal Medicine

## 2019-04-16 ENCOUNTER — Other Ambulatory Visit: Payer: Self-pay

## 2019-04-16 VITALS — BP 128/64 | HR 63 | Temp 97.3°F | Ht 63.0 in | Wt 205.0 lb

## 2019-04-16 DIAGNOSIS — E782 Mixed hyperlipidemia: Secondary | ICD-10-CM

## 2019-04-16 DIAGNOSIS — I4510 Unspecified right bundle-branch block: Secondary | ICD-10-CM | POA: Diagnosis not present

## 2019-04-16 DIAGNOSIS — I2510 Atherosclerotic heart disease of native coronary artery without angina pectoris: Secondary | ICD-10-CM

## 2019-04-16 DIAGNOSIS — Z789 Other specified health status: Secondary | ICD-10-CM

## 2019-04-16 NOTE — Patient Instructions (Signed)
Medication Instructions:  Your physician recommends that you continue on your current medications as directed. Please refer to the Current Medication list given to you today.  *If you need a refill on your cardiac medications before your next appointment, please call your pharmacy*  Lab Work: FASTING lab work to check cholesterol (needed for prior authorization for [place]) If you have labs (blood work) drawn today and your tests are completely normal, you will receive your results only by: Marland Kitchen MyChart Message (if you have MyChart) OR . A paper copy in the mail If you have any lab test that is abnormal or we need to change your treatment, we will call you to review the results.  Testing/Procedures: NONE  Follow-Up: At [facility], you and your health needs are our priority.  As part of our continuing mission to provide you with exceptional heart care, we have created designated Provider Care Teams.  These Care Teams include your primary Cardiologist (physician) and Advanced Practice Providers (APPs -  Physician Assistants and Nurse Practitioners) who all work together to provide you with the care you need, when you need it.  Your next appointment:   6 months  The format for your next appointment:   In Person  Provider:   K. Mali Hilty, MD  Other Instructions

## 2019-04-16 NOTE — Progress Notes (Signed)
Cardiology Office Note   Date:  04/16/2019   ID:  Whitney Newton, DOB 1940/08/27, MRN [id number]  PCP:  Aurea Graff.Marlou Sa, MD   CC: Follow-up Repatha  History of Present Illness: Whitney Newton is a 78 y.o. female who presents for  Four-month follow-up visit  This pleasant 78 year old woman is seen for a scheduled followup visit. The patient has a history of having had a stroke in early December 2014. She was at her diabetes clinic at [facility] and they diagnosed her and sent her straight to neurology where she was hospitalized for 3 days. The MRI showed a new stroke on the right side. She had carotid Dopplers which did not show any obstructive lesions but she did have some plaque and they put her back on aspirin and continued her Plavix. The patient had a echocardiogram which was a bubble study and did not show any evidence of right-to-left shunt. More recently, she has had further neurology workup at [facility] on 06/20/14. She had a transcranial Doppler. 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 Previous carotid Dopplers had not shown any significant external cranial disease She has a history of essential hypertension, diabetes mellitus, and dyslipidemia. She has had atypical chest pain. She is intolerant of statin drugs. We updated her lexiscan Myoview stress test on 03/17/12 and it was normal showing no evidence of ischemia and her ejection fraction was 78%. The patient has never had a cardiac catheterization. She has continued to have some shortness of breath. She had an echocardiogram in 04/07/12 which showed an ejection fraction of 55-65% with grade 1 diastolic dysfunction. There is trivial aortic insufficiency.  The patient has a history of essential hypertension as well as atypical chest pain. Her diabetes is now being followed at the  [facility] clinic at [facility]. Since we last saw her she had an ophthalmology evaluation on 07/27/14 by Dr. Kathrin Penner and no diabetic retinopathy was found. Her last chest x-ray was on 12/21/12 elsewhere and showed a normal heart size and low lung volumes. She had a recent CT scan of the chest on 03/23/13 at [facility] which showed normal heart size and demonstrated atherosclerosis of the aorta.  The patient has a past history of dermatofibrosarcoma of the spine. She had surgery for this at [facility]. She also has a past history of angiolipoma of the abdominal wall.  Since last visit she has not been experiencing any new cardiac symptoms. She does have sleep apnea. She uses a CPAP machine. She has had some low back pain and pain in her hip radiating to her knees. She has seen Dr. Durward Fortes. She was admitted to the hospital on 01/18/15 for chest and abdominal pain. She had a CTA of the chest on 01/18/15 which was negative for pulmonary embolus. The following day she had a Myoview on 01/19/15 which showed no ischemia and her ejection fraction was 85%. She had a lot of side effects from the [facility]. It was felt in retrospect that her presenting complaints were probably GI in origin rather than cardiac. She does have a history of GERD and sees Dr. Oletta Lamas.  since last visit she has had no new cardiac symptoms.  She has not been having any recent chest discomfort. Her weight is unchanged and her blood pressures been stable.  11/22/2015  Whitney Newton presents today to  establish care with me. She is a previous patient of Dr. Warren Danes. She recently was at the beach and developed some worsening shortness of breath and leg swelling. Her nephew was there who came down from [place] with a head cold and she seems to have symptoms now at this time. She was also seen at an urgent care there and placed on amoxicillin. She's taken that for a few days. She notes significant swelling  in her weight is much higher than it had been in the past. She endorses a high salt intake. Her last echocardiogram was in 2013 which showed an EF of 55-65% with grade 1 diastolic dysfunction. She had a stress test in 2016 for chest pain which showed an EF of 85%.  12/25/2015  I saw Whitney Newton that today in the office. She has been taking the Lasix 40 mg daily which was over a week and this resulted in significant diuresis. Her weight had reduced from 202 down to 189. She then had to stop it for a few days because of some other medical problems are currently weight is up to 194. Her BNP associate with this was very low at 8 and renal function appeared to be stable. She reports a 75% improvement in breathing. Her repeat echo shows that EF remains stable with grade 1 diastolic dysfunction.  03/13/2016  Whitney Newton reports she's had a marked improvement in her swelling and breathing with additional diuretics. Weight has been fairly stable. She reports one episode of chest pain which was responsive to nitroglycerin. She's also had some reflux symptoms and is currently on Zegerid which appears to be helping her symptoms. Is not clear whether these episodes of chest discomfort are related to reflux or coronary ischemia. She did have a negative Myoview stress test in August 2016.  08/29/2016  Whitney Newton was seen today in the office. Over the past several day she's had worsening shortness of breath and lower extremity swelling. Her weight is now up about 6 pounds from her previous office visit. She said over the auscultation is been taking Lasix more regularly. She's currently taking 40 mg daily but as previously taken that for 3 days and alternating with 20 mg. She notes some improvement today after a couple days of diuretics.  10/08/2016  Whitney Newton returns today for follow-up. She has diuresed several pounds with increased dose diuretics are per creatinine rose and therefore I decreased her diuretic back  to 40 mg daily. She says she feels like she is over dried out on that dose of diuretic. She occasionally gets some cramping. She's also had some labile blood sugars which she attributes to the diuretics. In addition she had a recent bowel obstruction which may been related to diuresis.  03/06/2017  Whitney Newton was seen today in follow-up. She recently got back from a trip to assess catch one with her husband. She said they were traveling for about 16 days and she did not take her Lasix during that period of time. Her weight is up about 6 pounds that she reports lower extremity swelling. She has a sliding scale Lasix to take but has not been compliant with that.  08/20/2017  Whitney Newton returns today for follow-up.  She has been having a lot of symptoms recently.  She is complaining of pain across her abdomen.  This is been further assessed and there is a suggestion it could be related to back surgery.  She was also found to have mass on the  liver and pancreas, thought to be hemangioma.  She is scheduled to have a CT scan of her abdomen next week.  She continues to gain weight.  Her weight is up 6 pounds since I saw her in September.  Which was 203 at the time.  Her weight had been as low as 195 last year.  She does report lower extremity edema and some worsening shortness of breath however she relates the shortness of breath more to her pain.  She had been on higher dose Lasix in the past but was noted to have some elevation in creatinine.  We did reduce the dose and she has been compliant taking 20 mg daily.  02/05/2018  Blakelee returns for follow-up today.  Again she has numerous complaints however she is recently been having worsening chest discomfort.  She describes it as a squeezing or pressure in the center of her chest sometimes radiates up her neck.  May be associated with worsening shortness of breath.  Despite this she is taken extra Lasix and her weight is come down to her baseline of 203.  She has not had  symptomatic improvement.  She was scheduled to see me in a few weeks but got an earlier appointment because of her chest discomfort.  Sometimes her symptoms are associated with exertion and relieved by rest at other times can be present at rest or when awakening.  02/23/2018  Whitney Newton was seen today in follow-up of heart cath.  She underwent left heart catheterization on 02/11/2018.  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  LVEF was 65% with normal LVEDP 18 mmHg.  Post cath she still reports shortness of breath with certain activities.  She did have recent lipid testing which was as follows: LDL 148, Total chol 225, HDL 54, Non-HDL 171, TG 178.  Her goal LDL is less than 70 and she has not yet reach that target.  Unfortunately she has been statin intolerant and is a good candidate for PCSK9 inhibitor.  05/22/2018  Whitney Newton is seen today in follow-up.  She was started on Repatha which she seems to be tolerating well.  She has been on the medicine now for about 3 months and is getting it from free with support from [place].  Recently she had transcranial Dopplers at [facility] which actually shows some mild improvement in her intracranial atherosclerosis.  This could be related to her aggressive lipid therapy.  She was supposed to have repeat labs prior to this visit however it was not performed.  We will need to order a repeat lipid profile in order to reassess her therapy so far.  08/10/2018  Whitney Newton returns today for follow-up.  She has been doing well with Repatha although has had several injections which have possibly failed.  She seems to be injecting on the anterior thigh and has had some bleeding and bruising.  She also has trouble depressing the injector and noted some leakage of the medication.  I did  perform some extra training with her today and helped describe to her a better way to hold the pen as well as a better injection site which I think should improve her drug delivery.  That being said in December, her total cholesterol come down significantly to 104, with HDL 62, LDL 26 and triglycerides of 81.  Hemoglobin A1c remains persistently elevated at 8.2.  04/16/2019  Whitney Newton is seen today in follow-up.  She thinks she might of had another stroke in May.  She had some word finding difficulties.  She is being followed by neurology at [facility] and had a recent transcranial Doppler and may have an MRI because she is been having some numbness and tingling in her arms.  She seems to be tolerating Repatha.  Her labs are markedly improved.  Total cholesterol now 104, triglycerides 81, HDL 62 and LDL 26.  No significant side effects with it.  Past Medical History:  Diagnosis Date   Acute diastolic (congestive) heart failure ([place]) 12/25/2015   Back pain    prior back surgery in 2009 - slow to recover   Cancer ([place])    Diabetes ([place])    Diverticulitis    Gastritis    Hemangioma of liver    managed conservatively; followed at [place] (hyperlipidemia)    statin intolerant   Hypertension    Normal cardiac stress test 2011   Obesity    Thyroid disease    hypothyroidism    Past Surgical History:  Procedure Laterality Date   ABDOMINAL HYSTERECTOMY     APPENDECTOMY     LEFT HEART CATH AND CORONARY ANGIOGRAPHY N/A 02/11/2018   Procedure: LEFT HEART CATH AND CORONARY ANGIOGRAPHY;  Surgeon: Troy Sine, MD;  Location: [facility];  Service: Cardiovascular;  Laterality: N/A;   NASAL RECONSTRUCTION       Current Outpatient Medications  Medication Sig Dispense Refill   aspirin 81 MG tablet Take 81 mg by mouth daily.     bisoprolol-hydrochlorothiazide (ZIAC) 10-6.25 MG tablet TAKE (1) TABLET DAILY AS DIRECTED. 90 tablet 3   clopidogrel (PLAVIX) 75 MG tablet TAKE 1  TABLET BY MOUTH DAILY. 90 tablet 3   colesevelam (WELCHOL) 625 MG tablet Take 3 tablets (1,875 mg total) by mouth 2 (two) times daily as needed (cholesterol). 180 tablet 6   diclofenac sodium (VOLTAREN) 1 % GEL Apply 1 application topically at bedtime as needed (for pain).     Evolocumab (REPATHA SURECLICK) XX123456 MG/ML SOAJ Inject 1 Dose into the skin every 14 (fourteen) days.      furosemide (LASIX) 40 MG tablet TAKE 1 TABLET ONCE DAILY. 90 tablet 2   gabapentin (NEURONTIN) 100 MG capsule Take 200 mg by mouth at bedtime.      HUMALOG KWIKPEN 100 UNIT/ML KiwkPen Inject 14 Units into the skin See admin instructions. Inject 11 units SQ at breakfast, inject 14 units SQ at lunch and inject 16 units SQ at supper. Plus sliding scale 1 unit for every 25 BS is > 120     hyoscyamine (LEVBID) 0.375 MG 12 hr tablet Take 0.375 mg by mouth every 12 (twelve) hours as needed (cramps).      insulin glargine (LANTUS) 100 UNIT/ML injection Inject 22 Units into the skin every morning.      levothyroxine (SYNTHROID, LEVOTHROID) 88 MCG tablet Take 88 mcg by mouth daily.      Liniments (SALONPAS EX) Apply 1 patch topically daily as needed (for pain).     losartan (COZAAR) 25 MG tablet TAKE 1 TABLET ONCE DAILY. 90 tablet 3   nitroGLYCERIN (NITROSTAT) 0.4 MG SL tablet DISSOLVE ONE TABLET UNDER TONGUE AS NEEDED FOR ARM/CHEST PAIN. (Patient taking differently: Place 0.4 mg under the tongue every 5 (five) minutes as needed for chest pain. ) 25 tablet PRN   potassium chloride SA (K-DUR,KLOR-CON)  20 MEQ tablet TAKE (2) TABLETS BY MOUTH DAILY. 180 tablet 4   TOUJEO SOLOSTAR 300 UNIT/ML SOPN Inject 22 Units into the skin.      No current facility-administered medications for this visit.     Allergies:   Celecoxib, Lipitor [atorvastatin calcium], Zetia [ezetimibe], Ropinirole hcl, Actos [pioglitazone hydrochloride], Crestor [rosuvastatin calcium], Doxycycline, Fenofibrate, Imdur [isosorbide nitrate], Metformin and  related, Pravachol, Ranolazine er, Niaspan [niacin er], and Ranolazine    Social History:  The patient  reports that she has never smoked. She has never used smokeless tobacco. She reports that she does not drink alcohol or use drugs.   Family History:  The patient's family history includes Dementia in her father; Heart attack in her brother and father; Heart disease in her brother and father; Hypertension in her mother; Stroke in her mother.    ROS:   Pertinent items noted in HPI and remainder of comprehensive ROS otherwise negative.  PHYSICAL EXAM: VS:  BP 128/64    Pulse 63    Temp (!) 97.3 F (36.3 C)    Ht 5\' 3"  (1.6 m)    Wt 205 lb (93 kg)    SpO2 98%    BMI 36.31 kg/m  , BMI Body mass index is 36.31 kg/m. General appearance: alert, no distress and moderately obese Neck: no carotid bruit and no JVD Lungs: diminished breath sounds bilaterally Heart: regular rate and rhythm Abdomen: soft, non-tender; bowel sounds normal; no masses,  no organomegaly Extremities: edema 1+ pedal edema Pulses: 2+ and symmetric Skin: Skin color, texture, turgor normal. No rashes or lesions Neurologic: Grossly normal Psych: Mildly anxious  EKG:   Normal sinus rhythm 62, incomplete right bundle branch block-personally reviewed  Recent Labs: No results found for requested labs within last 8760 hours.    Lipid Panel    Component Value Date/Time   CHOL 104 05/26/2018 1050   TRIG 81 05/26/2018 1050   HDL 62 05/26/2018 1050   CHOLHDL 1.7 05/26/2018 1050   CHOLHDL 3.8 11/15/2015 0954   VLDL 20 11/15/2015 0954   LDLCALC 26 05/26/2018 1050   LDLDIRECT 148.7 09/14/2012 0857      Wt Readings from Last 3 Encounters:  04/16/19 205 lb (93 kg)  12/17/18 206 lb (93.4 kg)  08/10/18 208 lb 3.2 oz (94.4 kg)     ASSESSMENT AND PLAN:  1. Acute diastolic congestive heart failure 2. Hypertensive heart disease without heart failure 3. Diabetes mellitus - on insulin 4. Atypical chest pain with normal  Myoview stress test in August 2016. No ischemia. Ejection fraction 85%. 63. old CVA followed at [facility] 6. History of dermatofibro- sarcoma of the spine. 7. Dyslipidemia 8. Osteoarthritis 9. OSA on CPAP [facility]) 10. Hypothyroidism followed at [facility]. 11. GERD, followed by Dr. Oletta Lamas  12. RBBB   PLAN:  Mrs. Piedra is doing well with Repatha.  She has had marked improvement in her dyslipidemia now with LDL of 26.  There was a possible recurrent stroke in May however she is seen by neurology at [facility].  Transcranial Dopplers performed she is scheduled to have MRI.  No medication changes today.   Follow-up 6 months.  Pixie Casino, MD, [facility], [place] Director of the Advanced Lipid Disorders &  Cardiovascular Risk Reduction Clinic Diplomate of the American Board of Clinical Lipidology Attending Cardiologist  Direct Dial: [phone]   Fax: [phone]  Website:  [email or web address]   04/16/2019 9:05 AM

## 2019-04-19 LAB — LIPID PANEL
Chol/HDL Ratio: 2.1 ratio (ref 0.0–4.4)
Cholesterol, Total: 136 mg/dL (ref 100–199)
HDL: 64 mg/dL (ref >39)
LDL Chol Calc (NIH): 49 mg/dL (ref 0–99)
Triglycerides: 131 mg/dL (ref 0–149)
VLDL Cholesterol Cal: 23 mg/dL (ref 5–40)

## 2019-04-20 DIAGNOSIS — D1803 Hemangioma of intra-abdominal structures: Secondary | ICD-10-CM | POA: Diagnosis not present

## 2019-04-20 DIAGNOSIS — I679 Cerebrovascular disease, unspecified: Secondary | ICD-10-CM | POA: Diagnosis not present

## 2019-04-20 DIAGNOSIS — Z1211 Encounter for screening for malignant neoplasm of colon: Secondary | ICD-10-CM | POA: Diagnosis not present

## 2019-04-20 DIAGNOSIS — K219 Gastro-esophageal reflux disease without esophagitis: Secondary | ICD-10-CM | POA: Diagnosis not present

## 2019-04-20 DIAGNOSIS — M546 Pain in thoracic spine: Secondary | ICD-10-CM | POA: Diagnosis not present

## 2019-04-20 DIAGNOSIS — K869 Disease of pancreas, unspecified: Secondary | ICD-10-CM | POA: Diagnosis not present

## 2019-04-20 DIAGNOSIS — E119 Type 2 diabetes mellitus without complications: Secondary | ICD-10-CM | POA: Diagnosis not present

## 2019-04-20 DIAGNOSIS — Z8719 Personal history of other diseases of the digestive system: Secondary | ICD-10-CM | POA: Diagnosis not present

## 2019-04-20 DIAGNOSIS — Z86010 Personal history of colonic polyps: Secondary | ICD-10-CM | POA: Diagnosis not present

## 2019-04-20 DIAGNOSIS — C4499 Other specified malignant neoplasm of skin, unspecified: Secondary | ICD-10-CM | POA: Diagnosis not present

## 2019-04-23 DIAGNOSIS — G9389 Other specified disorders of brain: Secondary | ICD-10-CM | POA: Diagnosis not present

## 2019-04-28 ENCOUNTER — Telehealth: Payer: Self-pay | Admitting: Internal Medicine

## 2019-04-28 NOTE — Telephone Encounter (Signed)
PA for repatha sureclick submitted via fax [phone]

## 2019-05-03 NOTE — Telephone Encounter (Signed)
repatha has been approved until 06/10/2019

## 2019-05-03 NOTE — Telephone Encounter (Signed)
Patient assistance application faxed to [place]. Patient notified via [place] - advised to apply for [employer]  assistance also.

## 2019-05-14 ENCOUNTER — Other Ambulatory Visit: Payer: Self-pay | Admitting: Gastroenterology

## 2019-05-14 DIAGNOSIS — R195 Other fecal abnormalities: Secondary | ICD-10-CM

## 2019-05-28 ENCOUNTER — Inpatient Hospital Stay: Admission: RE | Admit: 2019-05-28 | Payer: Medicare Other | Source: Ambulatory Visit

## 2019-06-30 ENCOUNTER — Ambulatory Visit: Payer: Medicare Other | Attending: Internal Medicine

## 2019-06-30 DIAGNOSIS — Z23 Encounter for immunization: Secondary | ICD-10-CM | POA: Insufficient documentation

## 2019-07-09 ENCOUNTER — Telehealth: Payer: Self-pay | Admitting: Internal Medicine

## 2019-07-09 MED ORDER — REPATHA SURECLICK 140 MG/ML ~~LOC~~ SOAJ: 1.0000 | SUBCUTANEOUS | 11 refills | Status: DC

## 2019-07-09 NOTE — Telephone Encounter (Signed)
Pt c/o medication issue:  1. Name of Medication: Evolocumab (REPATHA SURECLICK) [id number] MG/ML SOAJ  2. How are you currently taking this medication (dosage and times per day)? As directed  3. Are you having a reaction (difficulty breathing--STAT)? no  4. What is your medication issue? Patient states that she was not approved for patient assistance on this medication. She wants to know what she needs to do to get more medication. She has one injection left for Sunday.

## 2019-07-09 NOTE — Telephone Encounter (Signed)
Spoke with patient and advised to apply for healthwell grant. Rx sent to [facility]

## 2019-07-09 NOTE — Telephone Encounter (Signed)
Spoke to patient she stated she has one Repatha dose left for this Sunday.She wanted to ask Dr.Hilty's RN what to do if she needed a new prescription or does she need to fill out more paperwork.I will send message to her for advice.

## 2019-07-21 ENCOUNTER — Ambulatory Visit: Payer: Medicare Other | Attending: Internal Medicine

## 2019-07-21 DIAGNOSIS — Z23 Encounter for immunization: Secondary | ICD-10-CM

## 2019-07-21 NOTE — Progress Notes (Signed)
   Covid-19 Vaccination Clinic  Name:  LARRISA PRECHTEL    MRN: [id number] DOB: [birth year]  07/21/2019  Ms. Honegger was observed post Covid-19 immunization for 15 minutes without incidence. She was provided with Vaccine Information Sheet and instruction to access the V-Safe system.   Ms. Scheffler was instructed to call 911 with any severe reactions post vaccine: Marland Kitchen Difficulty breathing  . Swelling of your face and throat  . A fast heartbeat  . A bad rash all over your body  . Dizziness and weakness    Immunizations Administered    Name Date Dose VIS Date Route   Pfizer COVID-19 Vaccine 07/21/2019 12:51 PM 0.3 mL 05/21/2019 Intramuscular   Manufacturer: [place]   Lot: [id number]   [place]: [id number]

## 2019-08-04 ENCOUNTER — Other Ambulatory Visit: Payer: Self-pay | Admitting: Internal Medicine

## 2019-08-09 ENCOUNTER — Other Ambulatory Visit: Payer: Self-pay | Admitting: Internal Medicine

## 2019-08-26 ENCOUNTER — Other Ambulatory Visit: Payer: Self-pay | Admitting: Internal Medicine

## 2019-10-06 ENCOUNTER — Other Ambulatory Visit: Payer: Self-pay | Admitting: Internal Medicine

## 2019-10-22 ENCOUNTER — Other Ambulatory Visit: Payer: Self-pay

## 2019-10-22 ENCOUNTER — Encounter: Payer: Self-pay | Admitting: Internal Medicine

## 2019-10-22 ENCOUNTER — Ambulatory Visit (INDEPENDENT_AMBULATORY_CARE_PROVIDER_SITE_OTHER): Payer: Medicare Other | Admitting: Internal Medicine

## 2019-10-22 VITALS — BP 149/70 | HR 64 | Temp 97.5°F | Ht 62.0 in | Wt 212.0 lb

## 2019-10-22 DIAGNOSIS — I2510 Atherosclerotic heart disease of native coronary artery without angina pectoris: Secondary | ICD-10-CM | POA: Diagnosis not present

## 2019-10-22 DIAGNOSIS — I4510 Unspecified right bundle-branch block: Secondary | ICD-10-CM | POA: Diagnosis not present

## 2019-10-22 DIAGNOSIS — Z789 Other specified health status: Secondary | ICD-10-CM | POA: Diagnosis not present

## 2019-10-22 DIAGNOSIS — E782 Mixed hyperlipidemia: Secondary | ICD-10-CM | POA: Diagnosis not present

## 2019-10-22 NOTE — Patient Instructions (Signed)
Medication Instructions:  RESUME lasix 40mg  daily  Please check your BP at home - goal less than 130/85  *If you need a refill on your cardiac medications before your next appointment, please call your pharmacy*   Lab Work: FASTING lab work in 6 months - before next appointment  If you have labs (blood work) drawn today and your tests are completely normal, you will receive your results only by: Marland Kitchen MyChart Message (if you have MyChart) OR . A paper copy in the mail If you have any lab test that is abnormal or we need to change your treatment, we will call you to review the results.    Follow-Up: At [facility], you and your health needs are our priority.  As part of our continuing mission to provide you with exceptional heart care, we have created designated Provider Care Teams.  These Care Teams include your primary Cardiologist (physician) and Advanced Practice Providers (APPs -  Physician Assistants and Nurse Practitioners) who all work together to provide you with the care you need, when you need it.  We recommend signing up for the patient portal called "MyChart".  Sign up information is provided on this After Visit Summary.  MyChart is used to connect with patients for Virtual Visits (Telemedicine).  Patients are able to view lab/test results, encounter notes, upcoming appointments, etc.  Non-urgent messages can be sent to your provider as well.   To learn more about what you can do with MyChart, go to [email or web address].    Your next appointment:   6 month(s)  The format for your next appointment:   In Person  Provider:   You may see Pixie Casino, MD or one of the following Advanced Practice Providers on your designated Care Team:    Almyra Deforest, PA-C  Fabian Sharp, PA-C or   Roby Lofts, [place]    Other Instructions

## 2019-10-22 NOTE — Progress Notes (Signed)
Cardiology Office Note   Date:  10/22/2019   ID:  Whitney Newton, DOB [birth year], MRN [id number]  PCP:  Aurea Graff.Marlou Sa, MD   CC: Follow-up Repatha  History of Present Illness: Whitney Newton is a 79 y.o. female who presents for  Four-month follow-up visit  This pleasant 79 year old woman is seen for a scheduled followup visit. The patient has a history of having had a stroke in early December 2014. She was at her diabetes clinic at [facility] and they diagnosed her and sent her straight to neurology where she was hospitalized for 3 days. The MRI showed a new stroke on the right side. She had carotid Dopplers which did not show any obstructive lesions but she did have some plaque and they put her back on aspirin and continued her Plavix. The patient had a echocardiogram which was a bubble study and did not show any evidence of right-to-left shunt. More recently, she has had further neurology workup at [facility] on 06/20/14. She had a transcranial Doppler. 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 Previous carotid Dopplers had not shown any significant external cranial disease She has a history of essential hypertension, diabetes mellitus, and dyslipidemia. She has had atypical chest pain. She is intolerant of statin drugs. We updated her lexiscan Myoview stress test on 03/17/12 and it was normal showing no evidence of ischemia and her ejection fraction was 78%. The patient has never had a cardiac catheterization. She has continued to have some shortness of breath. She had an echocardiogram in 04/07/12 which showed an ejection fraction of 55-65% with grade 1 diastolic dysfunction. There is trivial aortic insufficiency.  The patient has a history of essential hypertension as well as atypical chest pain. Her diabetes is now being followed at the  [facility] clinic at [facility]. Since we last saw her she had an ophthalmology evaluation on 07/27/14 by Dr. Kathrin Penner and no diabetic retinopathy was found. Her last chest x-ray was on 12/21/12 elsewhere and showed a normal heart size and low lung volumes. She had a recent CT scan of the chest on 03/23/13 at [facility] which showed normal heart size and demonstrated atherosclerosis of the aorta.  The patient has a past history of dermatofibrosarcoma of the spine. She had surgery for this at [facility]. She also has a past history of angiolipoma of the abdominal wall.  Since last visit she has not been experiencing any new cardiac symptoms. She does have sleep apnea. She uses a CPAP machine. She has had some low back pain and pain in her hip radiating to her knees. She has seen Dr. Durward Fortes. She was admitted to the hospital on 01/18/15 for chest and abdominal pain. She had a CTA of the chest on 01/18/15 which was negative for pulmonary embolus. The following day she had a Myoview on 01/19/15 which showed no ischemia and her ejection fraction was 85%. She had a lot of side effects from the [facility]. It was felt in retrospect that her presenting complaints were probably GI in origin rather than cardiac. She does have a history of GERD and sees Dr. Oletta Lamas.  since last visit she has had no new cardiac symptoms.  She has not been having any recent chest discomfort. Her weight is unchanged and her blood pressures been stable.  11/22/2015  Whitney Newton presents today to  establish care with me. She is a previous patient of Dr. Warren Danes. She recently was at the beach and developed some worsening shortness of breath and leg swelling. Her nephew was there who came down from [place] with a head cold and she seems to have symptoms now at this time. She was also seen at an urgent care there and placed on amoxicillin. She's taken that for a few days. She notes significant swelling  in her weight is much higher than it had been in the past. She endorses a high salt intake. Her last echocardiogram was in 2013 which showed an EF of 55-65% with grade 1 diastolic dysfunction. She had a stress test in 2016 for chest pain which showed an EF of 85%.  12/25/2015  I saw Whitney Newton that today in the office. She has been taking the Lasix 40 mg daily which was over a week and this resulted in significant diuresis. Her weight had reduced from 202 down to 189. She then had to stop it for a few days because of some other medical problems are currently weight is up to 194. Her BNP associate with this was very low at 8 and renal function appeared to be stable. She reports a 75% improvement in breathing. Her repeat echo shows that EF remains stable with grade 1 diastolic dysfunction.  03/13/2016  Whitney Newton reports she's had a marked improvement in her swelling and breathing with additional diuretics. Weight has been fairly stable. She reports one episode of chest pain which was responsive to nitroglycerin. She's also had some reflux symptoms and is currently on Zegerid which appears to be helping her symptoms. Is not clear whether these episodes of chest discomfort are related to reflux or coronary ischemia. She did have a negative Myoview stress test in August 2016.  08/29/2016  Whitney Newton was seen today in the office. Over the past several day she's had worsening shortness of breath and lower extremity swelling. Her weight is now up about 6 pounds from her previous office visit. She said over the auscultation is been taking Lasix more regularly. She's currently taking 40 mg daily but as previously taken that for 3 days and alternating with 20 mg. She notes some improvement today after a couple days of diuretics.  10/08/2016  Whitney Newton returns today for follow-up. She has diuresed several pounds with increased dose diuretics are per creatinine rose and therefore I decreased her diuretic back  to 40 mg daily. She says she feels like she is over dried out on that dose of diuretic. She occasionally gets some cramping. She's also had some labile blood sugars which she attributes to the diuretics. In addition she had a recent bowel obstruction which may been related to diuresis.  03/06/2017  Whitney Newton was seen today in follow-up. She recently got back from a trip to assess catch one with her husband. She said they were traveling for about 16 days and she did not take her Lasix during that period of time. Her weight is up about 6 pounds that she reports lower extremity swelling. She has a sliding scale Lasix to take but has not been compliant with that.  08/20/2017  Whitney Newton returns today for follow-up.  She has been having a lot of symptoms recently.  She is complaining of pain across her abdomen.  This is been further assessed and there is a suggestion it could be related to back surgery.  She was also found to have mass on the  liver and pancreas, thought to be hemangioma.  She is scheduled to have a CT scan of her abdomen next week.  She continues to gain weight.  Her weight is up 6 pounds since I saw her in September.  Which was 203 at the time.  Her weight had been as low as 195 last year.  She does report lower extremity edema and some worsening shortness of breath however she relates the shortness of breath more to her pain.  She had been on higher dose Lasix in the past but was noted to have some elevation in creatinine.  We did reduce the dose and she has been compliant taking 20 mg daily.  02/05/2018  Whitney Newton returns for follow-up today.  Again she has numerous complaints however she is recently been having worsening chest discomfort.  She describes it as a squeezing or pressure in the center of her chest sometimes radiates up her neck.  May be associated with worsening shortness of breath.  Despite this she is taken extra Lasix and her weight is come down to her baseline of 203.  She has not had  symptomatic improvement.  She was scheduled to see me in a few weeks but got an earlier appointment because of her chest discomfort.  Sometimes her symptoms are associated with exertion and relieved by rest at other times can be present at rest or when awakening.  02/23/2018  Whitney Newton was seen today in follow-up of heart cath.  She underwent left heart catheterization on 02/11/2018.  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  LVEF was 65% with normal LVEDP 18 mmHg.  Post cath she still reports shortness of breath with certain activities.  She did have recent lipid testing which was as follows: LDL 148, Total chol 225, HDL 54, Non-HDL 171, TG 178.  Her goal LDL is less than 70 and she has not yet reach that target.  Unfortunately she has been statin intolerant and is a good candidate for PCSK9 inhibitor.  05/22/2018  Whitney Newton is seen today in follow-up.  She was started on Repatha which she seems to be tolerating well.  She has been on the medicine now for about 3 months and is getting it from free with support from [place].  Recently she had transcranial Dopplers at [facility] which actually shows some mild improvement in her intracranial atherosclerosis.  This could be related to her aggressive lipid therapy.  She was supposed to have repeat labs prior to this visit however it was not performed.  We will need to order a repeat lipid profile in order to reassess her therapy so far.  08/10/2018  Whitney Newton returns today for follow-up.  She has been doing well with Repatha although has had several injections which have possibly failed.  She seems to be injecting on the anterior thigh and has had some bleeding and bruising.  She also has trouble depressing the injector and noted some leakage of the medication.  I did  perform some extra training with her today and helped describe to her a better way to hold the pen as well as a better injection site which I think should improve her drug delivery.  That being said in December, her total cholesterol come down significantly to 104, with HDL 62, LDL 26 and triglycerides of 81.  Hemoglobin A1c remains persistently elevated at 8.2.  04/16/2019  Whitney Newton is seen today in follow-up.  She thinks she might of had another stroke in May.  She had some word finding difficulties.  She is being followed by neurology at [facility] and had a recent transcranial Doppler and may have an MRI because she is been having some numbness and tingling in her arms.  She seems to be tolerating Repatha.  Her labs are markedly improved.  Total cholesterol now 104, triglycerides 81, HDL 62 and LDL 26.  No significant side effects with it.  10/22/2019  Whitney Newton returns today for follow-up with her husband.  She denies any new stroke symptoms.  She is responded well to Repatha and although her LDL was down as low as 14 it is now come up to 49 about 6 months ago.  Again this is well controlled however the increase is due to dietary changes.  She says she is eating more pizza, bacon and eggs and other saturated fats.  In addition she has gained weight.  There is also lower extremity edema.  She reports lack of compliance with the Lasix.  She says she may go several days without taking any and then has to take several days worth.  She has had issues with incontinence and a spastic bladder.  Past Medical History:  Diagnosis Date  . Acute diastolic (congestive) heart failure ([place]) 12/25/2015  . Back pain    prior back surgery in 2009 - slow to recover  . Cancer ([place])   . Diabetes ([place])   . Diverticulitis   . Gastritis   . Hemangioma of liver    managed conservatively; followed at [place] (hyperlipidemia)    statin intolerant  . Hypertension   . Normal cardiac stress test 2011  . Obesity   . Thyroid  disease    hypothyroidism    Past Surgical History:  Procedure Laterality Date  . ABDOMINAL HYSTERECTOMY    . APPENDECTOMY    . LEFT HEART CATH AND CORONARY ANGIOGRAPHY N/A 02/11/2018   Procedure: LEFT HEART CATH AND CORONARY ANGIOGRAPHY;  Surgeon: Troy Sine, MD;  Location: [facility];  Service: Cardiovascular;  Laterality: N/A;  . NASAL RECONSTRUCTION       Current Outpatient Medications  Medication Sig Dispense Refill  . aspirin 81 MG tablet Take 81 mg by mouth daily.    . bisoprolol-hydrochlorothiazide (ZIAC) 10-6.25 MG tablet Take 1 tablet by mouth daily. 30 tablet 0  . clopidogrel (PLAVIX) 75 MG tablet Take 1 tablet (75 mg total) by mouth daily. 30 tablet 0  . colesevelam (WELCHOL) 625 MG tablet Take 3 tablets (1,875 mg total) by mouth 2 (two) times daily as needed (cholesterol). 180 tablet 6  . diclofenac sodium (VOLTAREN) 1 % GEL Apply 1 application topically at bedtime as needed (for pain).    . Evolocumab (REPATHA SURECLICK) XX123456 MG/ML SOAJ Inject 1 Dose into the skin every 14 (fourteen) days. 2 pen 11  . furosemide (LASIX) 40 MG tablet Take 1 tablet (40 mg total) by mouth daily. 30 tablet 0  . gabapentin (NEURONTIN) 100 MG capsule Take 200 mg by mouth at bedtime.     Marland Kitchen HUMALOG KWIKPEN 100 UNIT/ML KiwkPen Inject 14 Units into the skin See admin instructions. Inject 11 units SQ at breakfast, inject 14 units SQ at lunch and inject 16 units SQ at supper. Plus sliding scale 1 unit for every 25 BS is > 120    .  hyoscyamine (LEVBID) 0.375 MG 12 hr tablet Take 0.375 mg by mouth every 12 (twelve) hours as needed (cramps).     . insulin glargine (LANTUS) 100 UNIT/ML injection Inject 22 Units into the skin every morning.     Marland Kitchen levothyroxine (SYNTHROID, LEVOTHROID) 88 MCG tablet Take 88 mcg by mouth daily.     . Liniments (SALONPAS EX) Apply 1 patch topically daily as needed (for pain).    Marland Kitchen losartan (COZAAR) 25 MG tablet TAKE 1 TABLET ONCE DAILY. 90 tablet 2  . nitroGLYCERIN  (NITROSTAT) 0.4 MG SL tablet Place 1 tablet (0.4 mg total) under the tongue every 5 (five) minutes as needed for chest pain. 25 tablet 2  . potassium chloride SA (KLOR-CON) 20 MEQ tablet TAKE (2) TABLETS BY MOUTH DAILY. 180 tablet 0  . TOUJEO SOLOSTAR 300 UNIT/ML SOPN Inject 22 Units into the skin.      No current facility-administered medications for this visit.    Allergies:   Celecoxib, Lipitor [atorvastatin calcium], Zetia [ezetimibe], Ropinirole hcl, Actos [pioglitazone hydrochloride], Crestor [rosuvastatin calcium], Doxycycline, Fenofibrate, Imdur [isosorbide nitrate], Metformin and related, Pravachol, Ranolazine er, Niaspan [niacin er], and Ranolazine    Social History:  The patient  reports that she has never smoked. She has never used smokeless tobacco. She reports that she does not drink alcohol or use drugs.   Family History:  The patient's family history includes Dementia in her father; Heart attack in her brother and father; Heart disease in her brother and father; Hypertension in her mother; Stroke in her mother.    ROS:   Pertinent items noted in HPI and remainder of comprehensive ROS otherwise negative.  PHYSICAL EXAM: VS:  BP (!) 149/70   Pulse 64   Temp (!) 97.5 F (36.4 C)   Ht 5\' 2"  (1.575 m)   Wt 212 lb (96.2 kg)   SpO2 99%   BMI 38.78 kg/m  , BMI Body mass index is 38.78 kg/m. General appearance: alert, no distress and moderately obese Neck: no carotid bruit and no JVD Lungs: diminished breath sounds bilaterally Heart: regular rate and rhythm Abdomen: soft, non-tender; bowel sounds normal; no masses,  no organomegaly Extremities: edema 1+ pedal edema Pulses: 2+ and symmetric Skin: Skin color, texture, turgor normal. No rashes or lesions Neurologic: Grossly normal Psych: Mildly anxious  EKG:   Normal sinus rhythm 64, incomplete right bundle branch block-personally reviewed  Recent Labs: No results found for requested labs within last 8760 hours.     Lipid Panel    Component Value Date/Time   CHOL 136 04/19/2019 0934   TRIG 131 04/19/2019 0934   HDL 64 04/19/2019 0934   CHOLHDL 2.1 04/19/2019 0934   CHOLHDL 3.8 11/15/2015 0954   VLDL 20 11/15/2015 0954   LDLCALC 49 04/19/2019 0934   LDLDIRECT 148.7 09/14/2012 0857      Wt Readings from Last 3 Encounters:  10/22/19 212 lb (96.2 kg)  04/16/19 205 lb (93 kg)  12/17/18 206 lb (93.4 kg)     ASSESSMENT AND PLAN:  1. Acute diastolic congestive heart failure 2. Hypertensive heart disease without heart failure 3. Diabetes mellitus - on insulin 4. Atypical chest pain with normal Myoview stress test in August 2016. No ischemia. Ejection fraction 85%. 10. old CVA followed at [facility] 6. History of dermatofibro- sarcoma of the spine. 7. Dyslipidemia 8. Osteoarthritis 9. OSA on CPAP [facility]) 10. Hypothyroidism followed at [facility]. 11. GERD, followed by Dr. Oletta Lamas  12. RBBB   PLAN:  Mrs. Manspeaker has been less compliant with her Lasix.  The issue is that she has a spastic bladder and has problems with incontinence.  She recently had some improvement with medications but the medicine was too expensive.  She is therefore decreased her Lasix use because of that.  Unfortunately she is gaining weight and lower extremity edema.  She needs to go back on her Lasix 40 mg daily.  Blood pressure is elevated today if it does not improve better than 130/85 I would advise increasing her losartan up to 50 mg daily.   Follow-up 6 months.  Whitney Casino, MD, [facility], [place] Director of the Advanced Lipid Disorders &  Cardiovascular Risk Reduction Clinic Diplomate of the American Board of Clinical Lipidology Attending Cardiologist  Direct Dial: [phone]  Fax: [phone]  Website:  [email or web address]   10/22/2019 2:05 PM

## 2019-11-01 ENCOUNTER — Other Ambulatory Visit: Payer: Self-pay | Admitting: Internal Medicine

## 2019-12-18 ENCOUNTER — Other Ambulatory Visit: Payer: Self-pay | Admitting: Internal Medicine

## 2020-01-26 ENCOUNTER — Other Ambulatory Visit: Payer: Self-pay | Admitting: Internal Medicine

## 2020-01-26 NOTE — Telephone Encounter (Signed)
Rx has been sent to the pharmacy electronically. ° °

## 2020-01-27 ENCOUNTER — Other Ambulatory Visit: Payer: Self-pay | Admitting: Internal Medicine

## 2020-02-22 ENCOUNTER — Other Ambulatory Visit: Payer: Self-pay | Admitting: Internal Medicine

## 2020-02-22 DIAGNOSIS — E782 Mixed hyperlipidemia: Secondary | ICD-10-CM

## 2020-02-22 DIAGNOSIS — Z789 Other specified health status: Secondary | ICD-10-CM

## 2020-04-14 ENCOUNTER — Other Ambulatory Visit: Payer: Self-pay | Admitting: Internal Medicine

## 2020-04-19 ENCOUNTER — Encounter: Payer: Self-pay | Admitting: Internal Medicine

## 2020-04-19 ENCOUNTER — Ambulatory Visit (INDEPENDENT_AMBULATORY_CARE_PROVIDER_SITE_OTHER): Payer: Medicare Other | Admitting: Internal Medicine

## 2020-04-19 VITALS — BP 122/60 | HR 72 | Ht 63.5 in | Wt 215.8 lb

## 2020-04-19 DIAGNOSIS — I5033 Acute on chronic diastolic (congestive) heart failure: Secondary | ICD-10-CM

## 2020-04-19 DIAGNOSIS — I2510 Atherosclerotic heart disease of native coronary artery without angina pectoris: Secondary | ICD-10-CM | POA: Diagnosis not present

## 2020-04-19 DIAGNOSIS — R0602 Shortness of breath: Secondary | ICD-10-CM

## 2020-04-19 DIAGNOSIS — E785 Hyperlipidemia, unspecified: Secondary | ICD-10-CM

## 2020-04-19 LAB — SPECIMEN STATUS REPORT

## 2020-04-19 MED ORDER — FUROSEMIDE 40 MG PO TABS
40.0000 mg | ORAL_TABLET | Freq: Two times a day (BID) | ORAL | 3 refills | Status: DC
Start: 2020-04-19 — End: 2020-04-21

## 2020-04-19 NOTE — Patient Instructions (Signed)
Medication Instructions:  INCREASE lasix to 40mg  twice daily  *If you need a refill on your cardiac medications before your next appointment, please call your pharmacy*   Lab Work: BNP, BMET, Lipid Panel today   If you have labs (blood work) drawn today and your tests are completely normal, you will receive your results only by: Marland Kitchen MyChart Message (if you have MyChart) OR . A paper copy in the mail If you have any lab test that is abnormal or we need to change your treatment, we will call you to review the results.   Testing/Procedures: Echocardiogram @ [address] Floor   Follow-Up: At [facility], you and your health needs are our priority.  As part of our continuing mission to provide you with exceptional heart care, we have created designated Provider Care Teams.  These Care Teams include your primary Cardiologist (physician) and Advanced Practice Providers (APPs -  Physician Assistants and Nurse Practitioners) who all work together to provide you with the care you need, when you need it.  We recommend signing up for the patient portal called "MyChart".  Sign up information is provided on this After Visit Summary.  MyChart is used to connect with patients for Virtual Visits (Telemedicine).  Patients are able to view lab/test results, encounter notes, upcoming appointments, etc.  Non-urgent messages can be sent to your provider as well.   To learn more about what you can do with MyChart, go to [email or web address].    Your next appointment:   6 month(s)  The format for your next appointment:   In Person  Provider:   You may see Pixie Casino, MD or one of the following Advanced Practice Providers on your designated Care Team:    Almyra Deforest, PA-C  Fabian Sharp, PA-C or   Roby Lofts, [place]    Other Instructions

## 2020-04-19 NOTE — Progress Notes (Signed)
Cardiology Office Note   Date:  04/19/2020   ID:  Whitney Newton, DOB 03-27-1941, MRN [id number]  PCP:  Aurea Graff.Marlou Sa, MD   CC: Follow-up, leg edema  History of Present Illness: Whitney Newton is a 79 y.o. female who presents for  Four-month follow-up visit  This pleasant 79 year old woman is seen for a scheduled followup visit. The patient has a history of having had a stroke in early December 2014. She was at her diabetes clinic at [facility] and they diagnosed her and sent her straight to neurology where she was hospitalized for 3 days. The MRI showed a new stroke on the right side. She had carotid Dopplers which did not show any obstructive lesions but she did have some plaque and they put her back on aspirin and continued her Plavix. The patient had a echocardiogram which was a bubble study and did not show any evidence of right-to-left shunt. More recently, she has had further neurology workup at [facility] on 06/20/14. She had a transcranial Doppler. 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 Previous carotid Dopplers had not shown any significant external cranial disease She has a history of essential hypertension, diabetes mellitus, and dyslipidemia. She has had atypical chest pain. She is intolerant of statin drugs. We updated her lexiscan Myoview stress test on 03/17/12 and it was normal showing no evidence of ischemia and her ejection fraction was 78%. The patient has never had a cardiac catheterization. She has continued to have some shortness of breath. She had an echocardiogram in 04/07/12 which showed an ejection fraction of 55-65% with grade 1 diastolic dysfunction. There is trivial aortic insufficiency.  The patient has a history of essential hypertension as well as atypical chest pain. Her diabetes is now being followed at  the [facility] clinic at [facility]. Since we last saw her she had an ophthalmology evaluation on 07/27/14 by Dr. Kathrin Penner and no diabetic retinopathy was found. Her last chest x-ray was on 12/21/12 elsewhere and showed a normal heart size and low lung volumes. She had a recent CT scan of the chest on 03/23/13 at [facility] which showed normal heart size and demonstrated atherosclerosis of the aorta.  The patient has a past history of dermatofibrosarcoma of the spine. She had surgery for this at [facility]. She also has a past history of angiolipoma of the abdominal wall.  Since last visit she has not been experiencing any new cardiac symptoms. She does have sleep apnea. She uses a CPAP machine. She has had some low back pain and pain in her hip radiating to her knees. She has seen Dr. Durward Fortes. She was admitted to the hospital on 01/18/15 for chest and abdominal pain. She had a CTA of the chest on 01/18/15 which was negative for pulmonary embolus. The following day she had a Myoview on 01/19/15 which showed no ischemia and her ejection fraction was 85%. She had a lot of side effects from the [facility]. It was felt in retrospect that her presenting complaints were probably GI in origin rather than cardiac. She does have a history of GERD and sees Dr. Oletta Lamas.  since last visit she has had no new cardiac symptoms.  She has not been having any recent chest discomfort. Her weight is unchanged and her blood pressures been stable.  11/22/2015  Whitney Newton presents today  to establish care with me. She is a previous patient of Dr. Warren Danes. She [place] was at the beach and developed some worsening shortness of breath and leg swelling. Her nephew was there who came down from [place] with a head cold and she seems to have symptoms now at this time. She was also seen at an urgent care there and placed on amoxicillin. She's taken that for a few days. She notes significant  swelling in her weight is much higher than it had been in the past. She endorses a high salt intake. Her last echocardiogram was in 2013 which showed an EF of 55-65% with grade 1 diastolic dysfunction. She had a stress test in 2016 for chest pain which showed an EF of 85%.  12/25/2015  I saw Whitney Newton that today in the office. She has been taking the Lasix 40 mg daily which was over a week and this resulted in significant diuresis. Her weight had reduced from 202 down to 189. She then had to stop it for a few days because of some other medical problems are currently weight is up to 194. Her BNP associate with this was very low at 8 and renal function appeared to be stable. She reports a 75% improvement in breathing. Her repeat echo shows that EF remains stable with grade 1 diastolic dysfunction.  03/13/2016  Whitney Newton reports she's had a marked improvement in her swelling and breathing with additional diuretics. Weight has been fairly stable. She reports one episode of chest pain which was responsive to nitroglycerin. She's also had some reflux symptoms and is currently on Zegerid which appears to be helping her symptoms. Is not clear whether these episodes of chest discomfort are related to reflux or coronary ischemia. She did have a negative Myoview stress test in August 2016.  08/29/2016  Whitney Newton was seen today in the office. Over the past several day she's had worsening shortness of breath and lower extremity swelling. Her weight is now up about 6 pounds from her previous office visit. She said over the auscultation is been taking Lasix more regularly. She's currently taking 40 mg daily but as previously taken that for 3 days and alternating with 20 mg. She notes some improvement today after a couple days of diuretics.  10/08/2016  Whitney Newton returns today for follow-up. She has diuresed several pounds with increased dose diuretics are per creatinine rose and therefore I decreased her  diuretic back to 40 mg daily. She says she feels like she is over dried out on that dose of diuretic. She occasionally gets some cramping. She's also had some labile blood sugars which she attributes to the diuretics. In addition she had a recent bowel obstruction which may been related to diuresis.  03/06/2017  Whitney Newton was seen today in follow-up. She recently got back from a trip to assess catch one with her husband. She said they were traveling for about 16 days and she did not take her Lasix during that period of time. Her weight is up about 6 pounds that she reports lower extremity swelling. She has a sliding scale Lasix to take but has not been compliant with that.  08/20/2017  Whitney Newton returns today for follow-up.  She has been having a lot of symptoms recently.  She is complaining of pain across her abdomen.  This is been further assessed and there is a suggestion it could be related to back surgery.  She was also found to have mass on  the liver and pancreas, thought to be hemangioma.  She is scheduled to have a CT scan of her abdomen next week.  She continues to gain weight.  Her weight is up 6 pounds since I saw her in September.  Which was 203 at the time.  Her weight had been as low as 195 last year.  She does report lower extremity edema and some worsening shortness of breath however she relates the shortness of breath more to her pain.  She had been on higher dose Lasix in the past but was noted to have some elevation in creatinine.  We did reduce the dose and she has been compliant taking 20 mg daily.  02/05/2018  Whitney Newton returns for follow-up today.  Again she has numerous complaints however she is recently been having worsening chest discomfort.  She describes it as a squeezing or pressure in the center of her chest sometimes radiates up her neck.  May be associated with worsening shortness of breath.  Despite this she is taken extra Lasix and her weight is come down to her baseline of 203.  She has  not had symptomatic improvement.  She was scheduled to see me in a few weeks but got an earlier appointment because of her chest discomfort.  Sometimes her symptoms are associated with exertion and relieved by rest at other times can be present at rest or when awakening.  02/23/2018  Whitney Newton was seen today in follow-up of heart cath.  She underwent left heart catheterization on 02/11/2018.  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  LVEF was 65% with normal LVEDP 18 mmHg.  Post cath she still reports shortness of breath with certain activities.  She did have recent lipid testing which was as follows: LDL 148, Total chol 225, HDL 54, Non-HDL 171, TG 178.  Her goal LDL is less than 70 and she has not yet reach that target.  Unfortunately she has been statin intolerant and is a good candidate for PCSK9 inhibitor.  05/22/2018  Whitney Newton is seen today in follow-up.  She was started on Repatha which she seems to be tolerating well.  She has been on the medicine now for about 3 months and is getting it from free with support from [place].  Recently she had transcranial Dopplers at [facility] which actually shows some mild improvement in her intracranial atherosclerosis.  This could be related to her aggressive lipid therapy.  She was supposed to have repeat labs prior to this visit however it was not performed.  We will need to order a repeat lipid profile in order to reassess her therapy so far.  08/10/2018  Whitney Newton returns today for follow-up.  She has been doing well with Repatha although has had several injections which have possibly failed.  She seems to be injecting on the anterior thigh and has had some bleeding and bruising.  She also has trouble depressing the injector and noted some leakage of the medication.   I did perform some extra training with her today and helped describe to her a better way to hold the pen as well as a better injection site which I think should improve her drug delivery.  That being said in December, her total cholesterol come down significantly to 104, with HDL 62, LDL 26 and triglycerides of 81.  Hemoglobin A1c remains persistently elevated at 8.2.  04/16/2019  Whitney Newton is seen today in follow-up.  She thinks she might of had another stroke in May.  She had some word finding difficulties.  She is being followed by neurology at [facility] and had a recent transcranial Doppler and may have an MRI because she is been having some numbness and tingling in her arms.  She seems to be tolerating Repatha.  Her labs are markedly improved.  Total cholesterol now 104, triglycerides 81, HDL 62 and LDL 26.  No significant side effects with it.  10/22/2019  Whitney Newton returns today for follow-up with her husband.  She denies any new stroke symptoms.  She is responded well to Repatha and although her LDL was down as low as 14 it is now come up to 49 about 6 months ago.  Again this is well controlled however the increase is due to dietary changes.  She says she is eating more pizza, bacon and eggs and other saturated fats.  In addition she has gained weight.  There is also lower extremity edema.  She reports lack of compliance with the Lasix.  She says she may go several days without taking any and then has to take several days worth.  She has had issues with incontinence and a spastic bladder.  04/19/2020  Whitney Newton returns today with her husband.  Overall she reports some worsening shortness of breath.  She has had lower extremity edema and weight gain despite taking her Lasix more regularly.  She takes 40 mg daily.  Weight is up again another few pounds.  She is not had a repeat echo since 2017 which showed normal LVEF at the time but diastolic dysfunction.  Past Medical History:  Diagnosis Date  . Acute diastolic  (congestive) heart failure ([place]) 12/25/2015  . Back pain    prior back surgery in 2009 - slow to recover  . Cancer ([place])   . Diabetes ([place])   . Diverticulitis   . Gastritis   . Hemangioma of liver    managed conservatively; followed at [place] (hyperlipidemia)    statin intolerant  . Hypertension   . Normal cardiac stress test 2011  . Obesity   . Thyroid disease    hypothyroidism    Past Surgical History:  Procedure Laterality Date  . ABDOMINAL HYSTERECTOMY    . APPENDECTOMY    . LEFT HEART CATH AND CORONARY ANGIOGRAPHY N/A 02/11/2018   Procedure: LEFT HEART CATH AND CORONARY ANGIOGRAPHY;  Surgeon: Troy Sine, MD;  Location: [place] [facility];  Service: Cardiovascular;  Laterality: N/A;  . NASAL RECONSTRUCTION       Current Outpatient Medications  Medication Sig Dispense Refill  . aspirin 81 MG tablet Take 81 mg by mouth daily.    . bisoprolol-hydrochlorothiazide (ZIAC) 10-6.25 MG tablet TAKE (1) TABLET DAILY AS DIRECTED. 90 tablet 2  . clopidogrel (PLAVIX) 75 MG tablet TAKE 1 TABLET BY MOUTH DAILY. 90 tablet 2  . colesevelam (WELCHOL) 625 MG tablet Take 3 tablets (1,875 mg total) by mouth 2 (two) times daily as needed (cholesterol). 180 tablet 6  . diclofenac sodium (VOLTAREN) 1 % GEL Apply 1 application topically at bedtime as needed (for pain).    . Evolocumab (REPATHA SURECLICK) 242 MG/ML SOAJ Inject 1 Dose into the skin every 14 (fourteen) days. 2 pen 11  . furosemide (LASIX) 40 MG tablet TAKE 1 TABLET ONCE DAILY. 90 tablet 2  . gabapentin (NEURONTIN) 100 MG capsule  Take 200 mg by mouth at bedtime.     Marland Kitchen HUMALOG KWIKPEN 100 UNIT/ML KiwkPen Inject 14 Units into the skin See admin instructions. Inject 11 units SQ at breakfast, inject 14 units SQ at lunch and inject 16 units SQ at supper. Plus sliding scale 1 unit for every 25 BS is > 120    . hyoscyamine (LEVBID) 0.375 MG 12 hr tablet Take 0.375 mg by mouth every 12 (twelve) hours as needed (cramps).     . insulin  glargine (LANTUS) 100 UNIT/ML injection Inject 22 Units into the skin every morning.     Marland Kitchen levothyroxine (SYNTHROID, LEVOTHROID) 88 MCG tablet Take 88 mcg by mouth daily.     . Liniments (SALONPAS EX) Apply 1 patch topically daily as needed (for pain).    Marland Kitchen losartan (COZAAR) 25 MG tablet TAKE 1 TABLET ONCE DAILY. 90 tablet 0  . nitroGLYCERIN (NITROSTAT) 0.4 MG SL tablet Place 1 tablet (0.4 mg total) under the tongue every 5 (five) minutes as needed for chest pain. 25 tablet 2  . potassium chloride SA (KLOR-CON) 20 MEQ tablet TAKE (2) TABLETS BY MOUTH DAILY. 180 tablet 2  . TOUJEO SOLOSTAR 300 UNIT/ML SOPN Inject 22 Units into the skin.     . Glucagon 0.5 MG/0.1ML SOAJ glucagon (human recombinant) 1 mg solution for injection  Take by injection route. (Patient not taking: Reported on 04/19/2020)     No current facility-administered medications for this visit.    Allergies:   Celecoxib, Lipitor [atorvastatin calcium], Zetia [ezetimibe], Ropinirole hcl, Actos [pioglitazone hydrochloride], Crestor [rosuvastatin calcium], Doxycycline, Duloxetine, Fenofibrate, Fenofibrate, Imdur [isosorbide nitrate], Isosorbide, Metformin and related, Niaspan [niacin], Pravachol, Pravachol [pravastatin], Ranolazine er, Ropinirole, Rosuvastatin, Niaspan [niacin er], and Ranolazine    Social History:  The patient  reports that she has never smoked. She has never used smokeless tobacco. She reports that she does not drink alcohol and does not use drugs.   Family History:  The patient's family history includes Dementia in her father; Heart attack in her brother and father; Heart disease in her brother and father; Hypertension in her mother; Stroke in her mother.    ROS:   Pertinent items noted in HPI and remainder of comprehensive ROS otherwise negative.  PHYSICAL EXAM: VS:  BP 122/60   Pulse 72   Ht 5' 3.5" (1.613 m)   Wt 215 lb 12.8 oz (97.9 kg)   BMI 37.63 kg/m  , BMI Body mass index is 37.63 kg/m. General  appearance: alert, no distress and moderately obese Neck: no carotid bruit and no JVD Lungs: diminished breath sounds bilaterally Heart: regular rate and rhythm Abdomen: soft, non-tender; bowel sounds normal; no masses,  no organomegaly Extremities: edema 1+ pedal edema Pulses: 2+ and symmetric Skin: Skin color, texture, turgor normal. No rashes or lesions Neurologic: Grossly normal Psych: Mildly anxious  EKG:   Normal sinus rhythm, RBBB at 72-personally reviewed  Recent Labs: No results found for requested labs within last 8760 hours.    Lipid Panel    Component Value Date/Time   CHOL 136 04/19/2019 0934   TRIG 131 04/19/2019 0934   HDL 64 04/19/2019 0934   CHOLHDL 2.1 04/19/2019 0934   CHOLHDL 3.8 11/15/2015 0954   VLDL 20 11/15/2015 0954   LDLCALC 49 04/19/2019 0934   LDLDIRECT 148.7 09/14/2012 0857      Wt Readings from Last 3 Encounters:  04/19/20 215 lb 12.8 oz (97.9 kg)  10/22/19 212 lb (96.2 kg)  04/16/19 205 lb (93 kg)  ASSESSMENT AND PLAN:  1. Acute diastolic congestive heart failure 2. Hypertensive heart disease without heart failure 3. Diabetes mellitus - on insulin 4. Atypical chest pain with normal Myoview stress test in August 2016. No ischemia. Ejection fraction 85%. 69. old CVA followed at [facility] 6. History of dermatofibro- sarcoma of the spine. 7. Dyslipidemia 8. Osteoarthritis 9. OSA on CPAP [facility]) 10. Hypothyroidism followed at [facility]. 11. GERD, followed by Dr. Oletta Lamas  12. RBBB   PLAN:  Whitney Newton needs to have more swelling today consistent with probable acute diastolic congestive heart failure.  As we have not had a repeat echo in a number of years I had like to repeat that.  We will also check a lipid profile since she has been on Repatha, a metabolic profile and BNP.  Recommend increasing Lasix to 40 mg twice daily.   Follow-up 6 months or sooner if the echo findings dictate.  She should contact me if she  is not improving with the increase in her diuretics.  Pixie Casino, MD, [facility], [place] Director of the Advanced Lipid Disorders &  Cardiovascular Risk Reduction Clinic Diplomate of the American Board of Clinical Lipidology Attending Cardiologist  Direct Dial: [phone]  Fax: [phone]  Website:  [email or web address]   04/19/2020 10:38 AM

## 2020-04-20 LAB — BASIC METABOLIC PANEL
BUN/Creatinine Ratio: 19 (ref 12–28)
BUN: 16 mg/dL (ref 8–27)
CO2: 25 mmol/L (ref 20–29)
Calcium: 9.8 mg/dL (ref 8.7–10.3)
Chloride: 106 mmol/L (ref 96–106)
Creatinine, Ser: 0.85 mg/dL (ref 0.57–1.00)
GFR calc Af Amer: 75 mL/min/{1.73_m2} (ref >59)
GFR calc non Af Amer: 65 mL/min/{1.73_m2} (ref >59)
Glucose: 114 mg/dL — ABNORMAL HIGH (ref 65–99)
Potassium: 4.4 mmol/L (ref 3.5–5.2)
Sodium: 143 mmol/L (ref 134–144)

## 2020-04-20 LAB — LIPID PANEL
Chol/HDL Ratio: 1.7 ratio (ref 0.0–4.4)
Cholesterol, Total: 119 mg/dL (ref 100–199)
HDL: 70 mg/dL (ref >39)
LDL Chol Calc (NIH): 31 mg/dL (ref 0–99)
Triglycerides: 98 mg/dL (ref 0–149)
VLDL Cholesterol Cal: 18 mg/dL (ref 5–40)

## 2020-04-20 LAB — BRAIN NATRIURETIC PEPTIDE: BNP: 46.9 pg/mL (ref 0.0–100.0)

## 2020-04-21 ENCOUNTER — Other Ambulatory Visit: Payer: Self-pay | Admitting: *Deleted

## 2020-04-21 MED ORDER — FUROSEMIDE 40 MG PO TABS
60.0000 mg | ORAL_TABLET | Freq: Every day | ORAL | 3 refills | Status: DC
Start: 2020-04-21 — End: 2021-08-22

## 2020-04-22 ENCOUNTER — Ambulatory Visit: Payer: Medicare Other | Attending: Internal Medicine

## 2020-04-22 DIAGNOSIS — Z23 Encounter for immunization: Secondary | ICD-10-CM

## 2020-04-22 NOTE — Progress Notes (Signed)
   Covid-19 Vaccination Clinic  Name:  Whitney Newton    MRN: [id number] DOB: 1941-03-09  04/22/2020  Whitney Newton was observed post Covid-19 immunization for 15 minutes without incident. She was provided with Vaccine Information Sheet and instruction to access the V-Safe system.   Whitney Newton was instructed to call 911 with any severe reactions post vaccine: Marland Kitchen Difficulty breathing  . Swelling of face and throat  . A fast heartbeat  . A bad rash all over body  . Dizziness and weakness   Immunizations Administered    Name Date Dose VIS Date Route   Pfizer COVID-19 Vaccine 04/22/2020  4:09 PM 0.3 mL 03/29/2020 Intramuscular   Manufacturer: [place]   Lot: [id number]   [place]: [id number]

## 2020-05-11 ENCOUNTER — Ambulatory Visit (HOSPITAL_COMMUNITY): Payer: Medicare Other | Attending: Cardiovascular Disease

## 2020-05-11 ENCOUNTER — Other Ambulatory Visit: Payer: Self-pay

## 2020-05-11 DIAGNOSIS — R0602 Shortness of breath: Secondary | ICD-10-CM

## 2020-05-11 DIAGNOSIS — I2510 Atherosclerotic heart disease of native coronary artery without angina pectoris: Secondary | ICD-10-CM | POA: Insufficient documentation

## 2020-05-11 LAB — ECHOCARDIOGRAM COMPLETE
Area-P 1/2: 2.95 cm2
S' Lateral: 2.5 cm

## 2020-05-11 MED ORDER — PERFLUTREN LIPID MICROSPHERE
1.0000 mL | INTRAVENOUS | Status: AC | PRN
  Administered 2020-05-11: 2 mL via INTRAVENOUS

## 2020-05-18 ENCOUNTER — Telehealth: Payer: Self-pay | Admitting: Internal Medicine

## 2020-05-18 NOTE — Telephone Encounter (Signed)
PA for repatha submitted via CMM (Key: [id number])

## 2020-06-15 NOTE — Telephone Encounter (Signed)
Spoke with [employer] rep and med has been approved until 06/09/21

## 2020-06-27 ENCOUNTER — Other Ambulatory Visit (INDEPENDENT_AMBULATORY_CARE_PROVIDER_SITE_OTHER): Payer: Self-pay | Admitting: Family Medicine

## 2020-07-21 ENCOUNTER — Other Ambulatory Visit: Payer: Self-pay | Admitting: Internal Medicine

## 2020-07-21 ENCOUNTER — Other Ambulatory Visit (INDEPENDENT_AMBULATORY_CARE_PROVIDER_SITE_OTHER): Payer: Self-pay | Admitting: Family Medicine

## 2020-08-03 DIAGNOSIS — I771 Stricture of artery: Secondary | ICD-10-CM | POA: Diagnosis not present

## 2020-08-03 DIAGNOSIS — E119 Type 2 diabetes mellitus without complications: Secondary | ICD-10-CM | POA: Diagnosis not present

## 2020-08-03 DIAGNOSIS — Z8673 Personal history of transient ischemic attack (TIA), and cerebral infarction without residual deficits: Secondary | ICD-10-CM | POA: Diagnosis not present

## 2020-08-03 DIAGNOSIS — I10 Essential (primary) hypertension: Secondary | ICD-10-CM | POA: Diagnosis not present

## 2020-08-15 DIAGNOSIS — R29898 Other symptoms and signs involving the musculoskeletal system: Secondary | ICD-10-CM | POA: Diagnosis not present

## 2020-08-15 DIAGNOSIS — G5603 Carpal tunnel syndrome, bilateral upper limbs: Secondary | ICD-10-CM | POA: Diagnosis not present

## 2020-08-15 DIAGNOSIS — G5600 Carpal tunnel syndrome, unspecified upper limb: Secondary | ICD-10-CM | POA: Diagnosis not present

## 2020-08-15 DIAGNOSIS — Z7409 Other reduced mobility: Secondary | ICD-10-CM | POA: Diagnosis not present

## 2020-08-15 DIAGNOSIS — I771 Stricture of artery: Secondary | ICD-10-CM | POA: Diagnosis not present

## 2020-08-15 DIAGNOSIS — I6601 Occlusion and stenosis of right middle cerebral artery: Secondary | ICD-10-CM | POA: Diagnosis not present

## 2020-08-15 DIAGNOSIS — Z9989 Dependence on other enabling machines and devices: Secondary | ICD-10-CM | POA: Diagnosis not present

## 2020-08-15 DIAGNOSIS — Z8673 Personal history of transient ischemic attack (TIA), and cerebral infarction without residual deficits: Secondary | ICD-10-CM | POA: Diagnosis not present

## 2020-08-15 DIAGNOSIS — G4733 Obstructive sleep apnea (adult) (pediatric): Secondary | ICD-10-CM | POA: Diagnosis not present

## 2020-08-15 DIAGNOSIS — M5136 Other intervertebral disc degeneration, lumbar region: Secondary | ICD-10-CM | POA: Diagnosis not present

## 2020-08-23 ENCOUNTER — Other Ambulatory Visit (INDEPENDENT_AMBULATORY_CARE_PROVIDER_SITE_OTHER): Payer: Self-pay | Admitting: Family Medicine

## 2020-08-23 DIAGNOSIS — H3582 Retinal ischemia: Secondary | ICD-10-CM | POA: Diagnosis not present

## 2020-08-23 DIAGNOSIS — H33322 Round hole, left eye: Secondary | ICD-10-CM | POA: Diagnosis not present

## 2020-08-23 DIAGNOSIS — H35371 Puckering of macula, right eye: Secondary | ICD-10-CM | POA: Diagnosis not present

## 2020-08-23 DIAGNOSIS — E113392 Type 2 diabetes mellitus with moderate nonproliferative diabetic retinopathy without macular edema, left eye: Secondary | ICD-10-CM | POA: Diagnosis not present

## 2020-08-23 DIAGNOSIS — H31092 Other chorioretinal scars, left eye: Secondary | ICD-10-CM | POA: Diagnosis not present

## 2020-08-23 DIAGNOSIS — E113311 Type 2 diabetes mellitus with moderate nonproliferative diabetic retinopathy with macular edema, right eye: Secondary | ICD-10-CM | POA: Diagnosis not present

## 2020-08-24 DIAGNOSIS — E669 Obesity, unspecified: Secondary | ICD-10-CM | POA: Diagnosis not present

## 2020-08-24 DIAGNOSIS — E1165 Type 2 diabetes mellitus with hyperglycemia: Secondary | ICD-10-CM | POA: Diagnosis not present

## 2020-08-24 DIAGNOSIS — Z794 Long term (current) use of insulin: Secondary | ICD-10-CM | POA: Diagnosis not present

## 2020-08-24 DIAGNOSIS — I10 Essential (primary) hypertension: Secondary | ICD-10-CM | POA: Diagnosis not present

## 2020-08-24 DIAGNOSIS — Z6837 Body mass index (BMI) 37.0-37.9, adult: Secondary | ICD-10-CM | POA: Diagnosis not present

## 2020-08-24 DIAGNOSIS — Z79899 Other long term (current) drug therapy: Secondary | ICD-10-CM | POA: Diagnosis not present

## 2020-08-28 ENCOUNTER — Emergency Department (HOSPITAL_COMMUNITY): Payer: Medicare Other

## 2020-08-28 ENCOUNTER — Other Ambulatory Visit: Payer: Self-pay

## 2020-08-28 ENCOUNTER — Encounter (HOSPITAL_COMMUNITY): Payer: Self-pay

## 2020-08-28 ENCOUNTER — Emergency Department (HOSPITAL_COMMUNITY)
Admission: EM | Admit: 2020-08-28 | Discharge: 2020-08-28 | Disposition: A | Discharge status: Home / Self Care | Payer: Medicare Other | Attending: Emergency Medicine | Admitting: Emergency Medicine

## 2020-08-28 DIAGNOSIS — R000 Tachycardia, unspecified: Secondary | ICD-10-CM | POA: Diagnosis not present

## 2020-08-28 DIAGNOSIS — I110 Hypertensive heart disease with heart failure: Secondary | ICD-10-CM | POA: Insufficient documentation

## 2020-08-28 DIAGNOSIS — G4489 Other headache syndrome: Secondary | ICD-10-CM | POA: Diagnosis not present

## 2020-08-28 DIAGNOSIS — I639 Cerebral infarction, unspecified: Secondary | ICD-10-CM | POA: Diagnosis not present

## 2020-08-28 DIAGNOSIS — I5031 Acute diastolic (congestive) heart failure: Secondary | ICD-10-CM | POA: Insufficient documentation

## 2020-08-28 DIAGNOSIS — Z85828 Personal history of other malignant neoplasm of skin: Secondary | ICD-10-CM | POA: Diagnosis not present

## 2020-08-28 DIAGNOSIS — Z955 Presence of coronary angioplasty implant and graft: Secondary | ICD-10-CM | POA: Insufficient documentation

## 2020-08-28 DIAGNOSIS — J019 Acute sinusitis, unspecified: Secondary | ICD-10-CM | POA: Diagnosis not present

## 2020-08-28 DIAGNOSIS — Z79899 Other long term (current) drug therapy: Secondary | ICD-10-CM | POA: Insufficient documentation

## 2020-08-28 DIAGNOSIS — Z794 Long term (current) use of insulin: Secondary | ICD-10-CM | POA: Diagnosis not present

## 2020-08-28 DIAGNOSIS — E039 Hypothyroidism, unspecified: Secondary | ICD-10-CM | POA: Diagnosis not present

## 2020-08-28 DIAGNOSIS — E119 Type 2 diabetes mellitus without complications: Secondary | ICD-10-CM | POA: Insufficient documentation

## 2020-08-28 DIAGNOSIS — Z7982 Long term (current) use of aspirin: Secondary | ICD-10-CM | POA: Diagnosis not present

## 2020-08-28 DIAGNOSIS — G319 Degenerative disease of nervous system, unspecified: Secondary | ICD-10-CM | POA: Diagnosis not present

## 2020-08-28 DIAGNOSIS — N390 Urinary tract infection, site not specified: Secondary | ICD-10-CM | POA: Insufficient documentation

## 2020-08-28 DIAGNOSIS — I10 Essential (primary) hypertension: Secondary | ICD-10-CM | POA: Diagnosis not present

## 2020-08-28 DIAGNOSIS — R0902 Hypoxemia: Secondary | ICD-10-CM | POA: Diagnosis not present

## 2020-08-28 DIAGNOSIS — R42 Dizziness and giddiness: Secondary | ICD-10-CM | POA: Insufficient documentation

## 2020-08-28 LAB — I-STAT CHEM 8, ED
BUN: 23 mg/dL (ref 8–23)
Calcium, Ion: 1.15 mmol/L (ref 1.15–1.40)
Chloride: 105 mmol/L (ref 98–111)
Creatinine, Ser: 0.8 mg/dL (ref 0.44–1.00)
Glucose, Bld: 179 mg/dL — ABNORMAL HIGH (ref 70–99)
HCT: 40 % (ref 36.0–46.0)
Hemoglobin: 13.6 g/dL (ref 12.0–15.0)
Potassium: 3.9 mmol/L (ref 3.5–5.1)
Sodium: 142 mmol/L (ref 135–145)
TCO2: 28 mmol/L (ref 22–32)

## 2020-08-28 LAB — COMPREHENSIVE METABOLIC PANEL
ALT: 17 U/L (ref 0–44)
AST: 19 U/L (ref 15–41)
Albumin: 3.6 g/dL (ref 3.5–5.0)
Alkaline Phosphatase: 70 U/L (ref 38–126)
Anion gap: 9 (ref 5–15)
BUN: 20 mg/dL (ref 8–23)
CO2: 25 mmol/L (ref 22–32)
Calcium: 9.4 mg/dL (ref 8.9–10.3)
Chloride: 106 mmol/L (ref 98–111)
Creatinine, Ser: 0.91 mg/dL (ref 0.44–1.00)
GFR, Estimated: >60 mL/min (ref >60)
Glucose, Bld: 183 mg/dL — ABNORMAL HIGH (ref 70–99)
Potassium: 4 mmol/L (ref 3.5–5.1)
Sodium: 140 mmol/L (ref 135–145)
Total Bilirubin: 0.9 mg/dL (ref 0.3–1.2)
Total Protein: 6.1 g/dL — ABNORMAL LOW (ref 6.5–8.1)

## 2020-08-28 LAB — CBC
HCT: 41.5 % (ref 36.0–46.0)
Hemoglobin: 14 g/dL (ref 12.0–15.0)
MCH: 31.5 pg (ref 26.0–34.0)
MCHC: 33.7 g/dL (ref 30.0–36.0)
MCV: 93.3 fL (ref 80.0–100.0)
Platelets: 236 10*3/uL (ref 150–400)
RBC: 4.45 MIL/uL (ref 3.87–5.11)
RDW: 12.9 % (ref 11.5–15.5)
WBC: 8.6 10*3/uL (ref 4.0–10.5)
nRBC: 0 % (ref 0.0–0.2)

## 2020-08-28 LAB — DIFFERENTIAL
Abs Immature Granulocytes: 0.03 10*3/uL (ref 0.00–0.07)
Basophils Absolute: 0.1 10*3/uL (ref 0.0–0.1)
Basophils Relative: 1 %
Eosinophils Absolute: 0.2 10*3/uL (ref 0.0–0.5)
Eosinophils Relative: 2 %
Immature Granulocytes: 0 %
Lymphocytes Relative: 26 %
Lymphs Abs: 2.3 10*3/uL (ref 0.7–4.0)
Monocytes Absolute: 0.7 10*3/uL (ref 0.1–1.0)
Monocytes Relative: 8 %
Neutro Abs: 5.4 10*3/uL (ref 1.7–7.7)
Neutrophils Relative %: 63 %

## 2020-08-28 LAB — URINALYSIS, ROUTINE W REFLEX MICROSCOPIC
Bilirubin Urine: NEGATIVE
Glucose, UA: NEGATIVE mg/dL
Ketones, ur: 5 mg/dL — AB
Nitrite: POSITIVE — AB
Protein, ur: NEGATIVE mg/dL
Specific Gravity, Urine: 1.023 (ref 1.005–1.030)
pH: 5 (ref 5.0–8.0)

## 2020-08-28 LAB — APTT: aPTT: 23 seconds — ABNORMAL LOW (ref 24–36)

## 2020-08-28 LAB — PROTIME-INR
INR: 0.9 (ref 0.8–1.2)
Prothrombin Time: 12.2 seconds (ref 11.4–15.2)

## 2020-08-28 LAB — CBG MONITORING, ED: Glucose-Capillary: 158 mg/dL — ABNORMAL HIGH (ref 70–99)

## 2020-08-28 MED ORDER — ONDANSETRON 4 MG PO TBDP: 4.0000 mg | ORAL_TABLET | Freq: Three times a day (TID) | ORAL | 0 refills | Status: DC | PRN

## 2020-08-28 MED ORDER — GADOBUTROL 1 MMOL/ML IV SOLN
9.5000 mL | Freq: Once | INTRAVENOUS | Status: AC | PRN
  Administered 2020-08-28: 9.5 mL via INTRAVENOUS

## 2020-08-28 MED ORDER — CEPHALEXIN 500 MG PO CAPS: 500.0000 mg | ORAL_CAPSULE | Freq: Four times a day (QID) | ORAL | 0 refills | Status: DC

## 2020-08-28 MED ORDER — SODIUM CHLORIDE 0.9% FLUSH
3.0000 mL | Freq: Once | INTRAVENOUS | Status: DC
Start: 2020-08-28 — End: 2020-08-29

## 2020-08-28 MED ORDER — LORAZEPAM 2 MG/ML IJ SOLN
0.5000 mg | Freq: Once | INTRAMUSCULAR | Status: AC
  Administered 2020-08-28: 0.5 mg via INTRAVENOUS
  Filled 2020-08-28: qty 1

## 2020-08-28 MED ORDER — MECLIZINE HCL 12.5 MG PO TABS: 12.5000 mg | ORAL_TABLET | Freq: Three times a day (TID) | ORAL | 0 refills | Status: DC | PRN

## 2020-08-28 MED ORDER — MECLIZINE HCL 25 MG PO TABS
25.0000 mg | ORAL_TABLET | Freq: Once | ORAL | Status: AC
  Administered 2020-08-28: 25 mg via ORAL
  Filled 2020-08-28: qty 1

## 2020-08-28 MED ORDER — ONDANSETRON HCL 4 MG/2ML IJ SOLN
4.0000 mg | Freq: Once | INTRAMUSCULAR | Status: AC
  Administered 2020-08-28: 4 mg via INTRAVENOUS
  Filled 2020-08-28: qty 2

## 2020-08-28 NOTE — ED Triage Notes (Addendum)
Pt BIB EMS from home due to sudden onset of dizziness.Pt reports LKN was 1am Pt reports she woke up this morning an was hypertensive. Pt reports x3 strokes in the past. Pt reports dizziness when changing position. Pt also reports 80% blockage in her brain. Pt reports CHF with increase leg swelling.

## 2020-08-28 NOTE — Discharge Instructions (Signed)
Take meclizine as needed for dizziness.  Take antibiotic for UTI.  Please follow-up with your primary doctor this week.  Return to ER if your dizziness worsens, you develop vomiting, any fever, or other new concerning symptom.

## 2020-08-28 NOTE — ED Provider Notes (Signed)
[facility] EMERGENCY DEPARTMENT Provider Note   CSN: [id number] Arrival date & time: 08/28/20  1131     History Chief Complaint  Patient presents with  . Dizziness    Whitney Newton is a 80 y.o. female.  HPI      80 year old female with a history of congestive heart failure, diabetes, hemangioma of the liver, hyperlipidemia, hypertension, benign paroxysmal positional vertigo, CVA, hypothyroidism, OSA, migraine presents with concern for dizziness.  Reports she woke up and glucose was 80 and ate some candy she had at bedside, did not feel dizzy at that time. This AM, she woke aroun d930 and rolled over to find she was severe dizzy "like something pushing me down", could not walk, felt off balance like she was being pushed down and falling to the left.  Denies numbness, weakness, difficulty talking, visual changes or facial droop.  Has hx of vertigo but this feels different, not room spinning, but more of feeling of off balance. No chest pain, no dyspnea, no fever, no cough, no urinary symptoms, no vomiting, no diarrhea, no black or bloody stools No recent medication changes or stopping of medications.   Past Medical History:  Diagnosis Date  . Acute diastolic (congestive) heart failure ([place]) 12/25/2015  . Back pain    prior back surgery in 2009 - slow to recover  . Cancer ([place])   . Diabetes ([place])   . Diverticulitis   . Gastritis   . Hemangioma of liver    managed conservatively; followed at [facility] (hyperlipidemia)    statin intolerant  . Hypertension   . Normal cardiac stress test 2011  . Obesity   . Thyroid disease    hypothyroidism    Patient Active Problem List   Diagnosis Date Noted  . Acute pain of left knee 12/17/2018  . Unstable angina ([place])   . Benign paroxysmal positional vertigo of right ear 04/08/2017  . Meralgia paresthetica of both lower extremities 04/08/2017  . 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 Acute diastolic congestive  heart failure ([place]) 12/25/2015  . Dyspnea 11/22/2015  . Adrenal adenoma 02/14/2015  . Arthritis 02/14/2015  . Diabetes ([place]) 02/14/2015  . Diabetes mellitus type 2, uncontrolled ([place]) 02/14/2015  . Essential (primary) hypertension 02/14/2015  . HLD (hyperlipidemia) 02/14/2015  . BP (high blood pressure) 02/14/2015  . Disc disease with myelopathy, 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  . 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 Nerve root pain 02/14/2015  . Right hemisphere, 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[place]) 02/14/2015  . Cerebral vascular accident ([place]) 02/14/2015  . Benign essential HTN   . Esophageal reflux   . Hepatic hemangioma   . Acute kidney injury (nontraumatic) ([place]) 01/18/2015  . Diabetes mellitus type 2, controlled ([place]) 01/18/2015  . Pain in the chest   . Lumbar radiculopathy 12/02/2014  . RBBB 08/02/2014  . Cerebral atherosclerosis 12/08/2013  . Obstructive apnea 11/26/2013  . Cerebral infarct ([place]) 05/12/2013  . Atherosclerosis of aorta ([place]) 04/12/2013  . 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 Chest pain 04/12/2013  . Skin rash 01/15/2013  . 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/14/2012  . Fatty tumor 09/09/2012  . H/O malignant neoplasm of skin 09/09/2012  . CVA (cerebral vascular accident) ([place]) 07/31/2012  . Acquired aphasia 05/25/2012  . Neoplasm of uncertain behavior of skin 09/24/2011  . Bilateral edema of lower extremity 09/09/2011  . Diabetes mellitus, type 2 ([place]) 08/15/2011  . Combined hyperlipidemia associated with type 2 diabetes mellitus ([place]) 08/15/2011  .  Carpal tunnel syndrome 08/07/2011  . Lesion, thoracic root 08/07/2011  . Cervical nerve root disorder 07/18/2011  . 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 Hypothyroidism 09/18/2010  . Benign hypertensive heart disease without heart failure 09/18/2010  . Dyslipidemia 09/18/2010  . Type II or unspecified type diabetes mellitus without mention of complication, uncontrolled  09/18/2010  . Hemangioma of liver 09/18/2010  . Postmenopausal state 09/18/2010  . Chest pain 09/18/2010  . Dizziness 09/18/2010    Past Surgical History:  Procedure Laterality Date  . ABDOMINAL HYSTERECTOMY    . APPENDECTOMY    . LEFT HEART CATH AND CORONARY ANGIOGRAPHY N/A 02/11/2018   Procedure: LEFT HEART CATH AND CORONARY ANGIOGRAPHY;  Surgeon: Troy Sine, MD;  Location: [facility];  Service: Cardiovascular;  Laterality: N/A;  . NASAL RECONSTRUCTION       OB History   No obstetric history on file.     Family History  Problem Relation Age of Onset  . Stroke Mother   . Hypertension Mother   . Heart disease Father   . Heart attack Father   . Dementia Father   . Heart disease Brother   . Heart attack Brother     Social History   Tobacco Use  . Smoking status: Never Smoker  . Smokeless tobacco: Never Used  Substance Use Topics  . Alcohol use: No  . Drug use: No    Home Medications Prior to Admission medications   Medication Sig Start Date End Date Taking? Authorizing Provider  cephALEXin (KEFLEX) 500 MG capsule Take 1 capsule (500 mg total) by mouth 4 (four) times daily. 08/28/20  Yes Lucrezia Starch, MD  meclizine (ANTIVERT) 12.5 MG tablet Take 1 tablet (12.5 mg total) by mouth 3 (three) times daily as needed for dizziness. 08/28/20  Yes Lucrezia Starch, MD  ondansetron (ZOFRAN ODT) 4 MG disintegrating tablet Take 1 tablet (4 mg total) by mouth every 8 (eight) hours as needed for nausea or vomiting. 08/28/20  Yes Lucrezia Starch, MD  aspirin 81 MG tablet Take 81 mg by mouth daily.    [provider]  bisoprolol-hydrochlorothiazide [facility]) 10-6.25 MG tablet TAKE (1) TABLET DAILY AS DIRECTED. 01/26/20   Hilty, Nadean Corwin, MD  clopidogrel (PLAVIX) 75 MG tablet TAKE 1 TABLET BY MOUTH DAILY. 01/26/20   Hilty, Nadean Corwin, MD  colesevelam (WELCHOL) 625 MG tablet Take 3 tablets (1,875 mg total) by mouth 2 (two) times daily as needed (cholesterol).  02/15/15   Darlin Coco, MD  diclofenac sodium (VOLTAREN) 1 % GEL Apply 1 application topically at bedtime as needed (for pain).    [provider]  Evolocumab (REPATHA SURECLICK) 660 MG/ML SOAJ Inject 1 Dose into the skin every 14 (fourteen) days. 07/09/19   Hilty, Nadean Corwin, MD  furosemide (LASIX) 40 MG tablet Take 1.5 tablets (60 mg total) by mouth daily. 04/21/20   Hilty, Nadean Corwin, MD  gabapentin (NEURONTIN) 100 MG capsule Take 200 mg by mouth at bedtime.  09/02/11   [provider]  Glucagon 0.5 MG/0.1ML SOAJ glucagon (human recombinant) 1 mg solution for injection  Take by injection route. Patient not taking: Reported on 04/19/2020    [provider]  HUMALOG KWIKPEN 100 UNIT/ML KiwkPen Inject 14 Units into the skin See admin instructions. Inject 11 units SQ at breakfast, inject 14 units SQ at lunch and inject 16 units SQ at supper. Plus sliding scale 1 unit for every 25 BS is > 120 08/03/16  [provider]  hyoscyamine (LEVBID) 0.375 MG 12 hr tablet Take 0.375 mg by mouth every 12 (twelve) hours as needed (cramps).     [provider]  insulin glargine (LANTUS) 100 UNIT/ML injection Inject 22 Units into the skin every morning.  06/15/12   Darlin Coco, MD  levothyroxine (SYNTHROID, LEVOTHROID) 88 MCG tablet Take 88 mcg by mouth daily.  07/28/14   [provider]  Liniments (SALONPAS EX) Apply 1 patch topically daily as needed (for pain).    [provider]  losartan (COZAAR) 25 MG tablet TAKE 1 TABLET ONCE DAILY. 07/21/20   Hilty, Nadean Corwin, MD  nitroGLYCERIN (NITROSTAT) 0.4 MG SL tablet Place 1 tablet (0.4 mg total) under the tongue every 5 (five) minutes as needed for chest pain. 08/09/19   Hilty, Nadean Corwin, MD  potassium chloride SA (KLOR-CON) 20 MEQ tablet TAKE (2) TABLETS BY MOUTH DAILY. 01/27/20   Hilty, Nadean Corwin, MD  REPATHA 140 MG/ML SOSY INJECT ONE DOSE EVERY 14 DAYS 08/24/20   Hilts, Legrand Como, MD  TOUJEO SOLOSTAR 300  UNIT/ML SOPN Inject 22 Units into the skin.  02/19/19   [provider]    Allergies    Celecoxib, Lipitor [atorvastatin calcium], Zetia [ezetimibe], Ropinirole hcl, Actos [pioglitazone hydrochloride], Crestor [rosuvastatin calcium], Doxycycline, Duloxetine, Fenofibrate, Fenofibrate, Imdur [isosorbide nitrate], Isosorbide, Metformin and related, Niaspan [niacin], Pravachol, Pravachol [pravastatin], Ranolazine er, Ropinirole, Rosuvastatin, Niaspan [niacin er], and Ranolazine  Review of Systems   Review of Systems  Constitutional: Negative for fever.  HENT: Negative for sore throat.   Eyes: Negative for visual disturbance.  Respiratory: Negative for cough and shortness of breath.   Cardiovascular: Negative for chest pain.  Gastrointestinal: Positive for nausea. Negative for abdominal pain, blood in stool, constipation, diarrhea and vomiting.  Genitourinary: Negative for difficulty urinating.  Musculoskeletal: Positive for gait problem. Negative for back pain and neck pain.  Skin: Negative for rash.  Neurological: Positive for dizziness. Negative for syncope, speech difficulty, weakness, numbness and headaches.    Physical Exam Updated Vital Signs BP (!) 149/39   Pulse 60   Temp (!) 97.5 F (36.4 C)   Resp 14   SpO2 100%   Physical Exam Constitutional:      General: She is not in acute distress.    Appearance: Normal appearance. She is not ill-appearing.  HENT:     Head: Normocephalic and atraumatic.  Eyes:     General: No visual field deficit.    Extraocular Movements: Extraocular movements intact.     Conjunctiva/sclera: Conjunctivae normal.     Pupils: Pupils are equal, round, and reactive to light.  Cardiovascular:     Rate and Rhythm: Normal rate and regular rhythm.     Pulses: Normal pulses.  Pulmonary:     Effort: Pulmonary effort is normal. No respiratory distress.  Musculoskeletal:        General: No swelling or tenderness.     Cervical back: Normal range  of motion.  Skin:    General: Skin is warm and dry.     Findings: No erythema or rash.  Neurological:     General: No focal deficit present.     Mental Status: She is alert and oriented to person, place, and time.     GCS: GCS eye subscore is 4. GCS verbal subscore is 5. GCS motor subscore is 6.     Cranial Nerves: No cranial nerve deficit, dysarthria or facial asymmetry.     Sensory: No sensory deficit.  Motor: No weakness or tremor.     Coordination: Coordination normal. Finger-Nose-Finger Test normal.     Gait: Gait normal.     ED Results / Procedures / Treatments   Labs (all labs ordered are listed, but only abnormal results are displayed) Labs Reviewed  APTT - Abnormal; Notable for the following components:      Result Value   aPTT 23 (*)    All other components within normal limits  COMPREHENSIVE METABOLIC PANEL - Abnormal; Notable for the following components:   Glucose, Bld 183 (*)    Total Protein 6.1 (*)    All other components within normal limits  URINALYSIS, ROUTINE W REFLEX MICROSCOPIC - Abnormal; Notable for the following components:   APPearance HAZY (*)    Hgb urine dipstick SMALL (*)    Ketones, ur 5 (*)    Nitrite POSITIVE (*)    Leukocytes,Ua MODERATE (*)    Bacteria, UA MANY (*)    All other components within normal limits  I-STAT CHEM 8, ED - Abnormal; Notable for the following components:   Glucose, Bld 179 (*)    All other components within normal limits  CBG MONITORING, ED - Abnormal; Notable for the following components:   Glucose-Capillary 158 (*)    All other components within normal limits  URINE CULTURE  PROTIME-INR  CBC  DIFFERENTIAL    EKG EKG Interpretation  Date/Time:  Monday August 28 2020 11:39:38 EDT Ventricular Rate:  61 PR Interval:    QRS Duration: 124 QT Interval:  448 QTC Calculation: 452 R Axis:   -14 Text Interpretation: Sinus rhythm IVCD, consider atypical RBBB Minimal ST elevation, inferior leads No significant  change since last tracing Confirmed by Gareth Morgan [id number]) on 08/28/2020 1:16:58 PM   Radiology CT HEAD WO CONTRAST  Result Date: 08/28/2020 CLINICAL DATA:  Neurological deficit.  Dizziness. EXAM: CT HEAD WITHOUT CONTRAST TECHNIQUE: Contiguous axial images were obtained from the base of the skull through the vertex without intravenous contrast. COMPARISON:  07/13/2008 FINDINGS: Brain: No evidence of acute infarction, hemorrhage, extra-axial collection, ventriculomegaly, or mass effect. Generalized cerebral atrophy. Periventricular white matter low attenuation likely secondary to microangiopathy. Vascular: Cerebrovascular atherosclerotic calcifications are noted. Skull: Negative for fracture or focal lesion. Sinuses/Orbits: Visualized portions of the orbits are unremarkable. Visualized portions of the paranasal sinuses are unremarkable. Visualized portions of the mastoid air cells are unremarkable. Other: None. IMPRESSION: 1. No acute intracranial pathology. 2. Chronic microvascular disease and cerebral atrophy. Electronically Signed   By: Kathreen Devoid   On: 08/28/2020 14:08   MR Brain W and Wo Contrast  Result Date: 08/28/2020 CLINICAL DATA:  Dizziness, nonspecific. Additional provided: Sudden onset dizziness, patient awoke hypertensive this morning, patient reports 3 prior strokes. EXAM: MRI HEAD WITHOUT AND WITH CONTRAST TECHNIQUE: Multiplanar, multiecho pulse sequences of the brain and surrounding structures were obtained without and with intravenous contrast. CONTRAST:  9.64mL GADAVIST GADOBUTROL 1 MMOL/ML IV SOLN COMPARISON:  Prior head CT examinations 08/28/2020 and earlier. Brain MRI 06/25/2006. FINDINGS: Brain: Intermittently motion degraded examination. Most notably, there is mild-to-moderate motion degradation of the axial T2/FLAIR sequence and mild-to-moderate motion degradation of the coronal T1 weighted postcontrast sequence. Mild cerebral and cerebellar atrophy. Mild multifocal T2/FLAIR  hyperintensity within the cerebral white matter is nonspecific, but compatible with chronic small vessel ischemic disease. Small right frontoparietal cortical infarct along the central sulcus, new from the brain MRI of 06/24/2006. There is no acute infarct. No evidence of intracranial mass. No chronic intracranial blood products.  No extra-axial fluid collection. No midline shift. No abnormal intracranial enhancement. Vascular: Expected proximal arterial flow voids. Skull and upper cervical spine: No focal marrow lesion. Sinuses/Orbits: Visualized orbits show no acute finding. Trace bilateral ethmoid sinus mucosal thickening. Mild left maxillary sinus mucosal thickening. IMPRESSION: Intermittently motion degraded examination as described. No evidence of acute intracranial abnormality, including acute infarction. Small chronic right frontoparietal cortical infarct along the right central sulcus, new as compared to the brain MRI of 06/25/2006. Mild generalized parenchymal atrophy and cerebral white matter chronic small vessel ischemic disease, also progressed from this prior exam. Mild paranasal sinus disease as described. Electronically Signed   By: Kellie Simmering DO   On: 08/28/2020 18:16    Procedures Procedures   Medications Ordered in ED Medications  sodium chloride flush (NS) 0.9 % injection 3 mL (3 mLs Intravenous Not Given 08/28/20 1232)  LORazepam (ATIVAN) injection 0.5 mg (0.5 mg Intravenous Given 08/28/20 1631)  gadobutrol (GADAVIST) 1 MMOL/ML injection 9.5 mL (9.5 mLs Intravenous Contrast Given 08/28/20 1721)  meclizine (ANTIVERT) tablet 25 mg (25 mg Oral Given 08/28/20 1823)  ondansetron (ZOFRAN) injection 4 mg (4 mg Intravenous Given 08/28/20 2017)    ED Course  I have reviewed the triage vital signs and the nursing notes.  Pertinent labs & imaging results that were available during my care of the patient were reviewed by me and considered in my medical decision making (see chart for  details).    MDM Rules/Calculators/A&P                          80 year old female with a history of congestive heart failure, diabetes, hemangioma of the liver, hyperlipidemia, hypertension, benign paroxysmal positional vertigo, CVA, hypothyroidism, OSA, migraine presents with concern for dizziness. Differential diagnosis for dizziness includes central causes such as stroke, intracranial bleed, mass and peripheral causes such as BPPV, meniere's disease, viral, other etiologies including anemia, infection, electrolyte abnormalities.  No significant anemia or electrolyte abnormalities. Given unclear LNW, LVO negative, did not call [facility].  Discussed with Dr. Cheral Marker.  CT head ordered and negative. MRI WWO contrast ordered and pending at time of transfer of care as well as UA.   Final Clinical Impression(s) / ED Diagnoses Final diagnoses:  Vertigo  Urinary tract infection without hematuria, site unspecified    Rx / DC Orders ED Discharge Orders         Ordered    meclizine (ANTIVERT) 12.5 MG tablet  3 times daily PRN        08/28/20 2028    cephALEXin (KEFLEX) 500 MG capsule  4 times daily        08/28/20 2028    ondansetron (ZOFRAN ODT) 4 MG disintegrating tablet  Every 8 hours PRN        08/28/20 2032           Gareth Morgan, MD 08/28/20 2234

## 2020-08-28 NOTE — ED Notes (Signed)
Patient transported to MRI 

## 2020-08-28 NOTE — ED Provider Notes (Signed)
Signout note  80 year old lady presenting to the emergency room with concern for dizziness.  Last known well 1 AM.  Has history of prior stroke.  Basic labs are stable. UA with pos UTI. CT negative. MRI ordered.   4:00 PM received sign out from [place] - f/u on MRI results, if negative, dc home  MRI is negative, patient symptoms have improved.  Will provide Rx for meclizine and Keflex for UTI.  Recommend close follow-up with primary doctor.  Suspect most likely vertigo is peripheral etiology.  Discharged with son and husband.   Lucrezia Starch, MD 08/29/20 [phone]

## 2020-09-01 LAB — URINE CULTURE: Culture: >=100000 — AB

## 2020-09-13 DIAGNOSIS — E113311 Type 2 diabetes mellitus with moderate nonproliferative diabetic retinopathy with macular edema, right eye: Secondary | ICD-10-CM | POA: Diagnosis not present

## 2020-09-13 DIAGNOSIS — H40003 Preglaucoma, unspecified, bilateral: Secondary | ICD-10-CM | POA: Diagnosis not present

## 2020-09-18 ENCOUNTER — Other Ambulatory Visit (INDEPENDENT_AMBULATORY_CARE_PROVIDER_SITE_OTHER): Payer: Self-pay | Admitting: Family Medicine

## 2020-09-20 DIAGNOSIS — G4733 Obstructive sleep apnea (adult) (pediatric): Secondary | ICD-10-CM | POA: Diagnosis not present

## 2020-10-09 ENCOUNTER — Other Ambulatory Visit: Payer: Self-pay | Admitting: Internal Medicine

## 2020-10-18 DIAGNOSIS — Z01419 Encounter for gynecological examination (general) (routine) without abnormal findings: Secondary | ICD-10-CM | POA: Diagnosis not present

## 2020-10-18 DIAGNOSIS — Z1231 Encounter for screening mammogram for malignant neoplasm of breast: Secondary | ICD-10-CM | POA: Diagnosis not present

## 2020-10-18 DIAGNOSIS — Z6838 Body mass index (BMI) 38.0-38.9, adult: Secondary | ICD-10-CM | POA: Diagnosis not present

## 2020-10-19 ENCOUNTER — Other Ambulatory Visit (INDEPENDENT_AMBULATORY_CARE_PROVIDER_SITE_OTHER): Payer: Self-pay | Admitting: Family Medicine

## 2020-11-02 ENCOUNTER — Other Ambulatory Visit: Payer: Self-pay | Admitting: Internal Medicine

## 2020-11-11 ENCOUNTER — Other Ambulatory Visit (INDEPENDENT_AMBULATORY_CARE_PROVIDER_SITE_OTHER): Payer: Self-pay | Admitting: Family Medicine

## 2020-11-16 ENCOUNTER — Other Ambulatory Visit (INDEPENDENT_AMBULATORY_CARE_PROVIDER_SITE_OTHER): Payer: Self-pay | Admitting: Family Medicine

## 2020-11-17 ENCOUNTER — Other Ambulatory Visit (INDEPENDENT_AMBULATORY_CARE_PROVIDER_SITE_OTHER): Payer: Self-pay | Admitting: Family Medicine

## 2020-11-17 MED ORDER — REPATHA 140 MG/ML ~~LOC~~ SOSY
PREFILLED_SYRINGE | SUBCUTANEOUS | 11 refills | Status: DC
Start: 2020-11-17 — End: 2021-11-16

## 2020-11-21 ENCOUNTER — Ambulatory Visit (INDEPENDENT_AMBULATORY_CARE_PROVIDER_SITE_OTHER): Payer: Medicare Other | Admitting: Orthopaedic Surgery

## 2020-11-21 ENCOUNTER — Other Ambulatory Visit: Payer: Self-pay

## 2020-11-21 ENCOUNTER — Encounter: Payer: Self-pay | Admitting: Orthopaedic Surgery

## 2020-11-21 ENCOUNTER — Ambulatory Visit: Payer: Self-pay

## 2020-11-21 VITALS — Ht 63.5 in | Wt 212.0 lb

## 2020-11-21 DIAGNOSIS — M25531 Pain in right wrist: Secondary | ICD-10-CM | POA: Diagnosis not present

## 2020-11-21 DIAGNOSIS — M25562 Pain in left knee: Secondary | ICD-10-CM | POA: Diagnosis not present

## 2020-11-21 DIAGNOSIS — M1712 Unilateral primary osteoarthritis, left knee: Secondary | ICD-10-CM | POA: Diagnosis not present

## 2020-11-21 DIAGNOSIS — G8929 Other chronic pain: Secondary | ICD-10-CM | POA: Diagnosis not present

## 2020-11-21 MED ORDER — LIDOCAINE HCL 1 % IJ SOLN
2.0000 mL | INTRAMUSCULAR | Status: AC | PRN
  Administered 2020-11-21: 2 mL

## 2020-11-21 MED ORDER — BUPIVACAINE HCL 0.5 % IJ SOLN
2.0000 mL | INTRAMUSCULAR | Status: AC | PRN
  Administered 2020-11-21: 2 mL via INTRA_ARTICULAR

## 2020-11-21 NOTE — Progress Notes (Signed)
Office Visit Note   Patient: Whitney Newton           Date of Birth: 01-Mar-1941           MRN: [id number] Visit Date: 11/21/2020              Requested by: Alroy Dust, L.Marlou Sa, [place] [address] [place],  [place] [id number] PCP: Alroy Dust, L.Marlou Sa, MD   Assessment & Plan: Visit Diagnoses:  1. Chronic pain of left knee   2. Pain in right wrist   3. Unilateral primary osteoarthritis, left knee     Plan: Advanced osteoarthritis left knee.  Long discussion regarding treatment options.  Multiple medical comorbidities that would obviate surgery.  Will inject medial compartment left knee with cortisone.  She will have to monitor her blood sugars as she is diabetic.  Consider office visit in 2 weeks to inject de Quervain's right wrist.  Continue with a wrist splint  Follow-Up Instructions: Return in about 2 weeks (around 12/05/2020).   Orders:  Orders Placed This Encounter  Procedures   Large Joint Inj: L knee   XR Wrist 2 Views Right   XR KNEE 3 VIEW LEFT   No orders of the defined types were placed in this encounter.     Procedures: Large Joint Inj: L knee on 11/21/2020 4:59 PM Indications: pain and diagnostic evaluation Details: 25 G 1.5 in needle, anteromedial approach  Arthrogram: No  Medications: 2 mL lidocaine 1 %; 2 mL bupivacaine 0.5 %  12 mg betamethasone injected with Xylocaine and Marcaine medial compartment left knee Procedure, treatment alternatives, risks and benefits explained, specific risks discussed. Consent was given by the patient. Patient was prepped and draped in the usual sterile fashion.      Clinical Data: No additional findings.   Subjective: Chief Complaint  Patient presents with   Right Wrist - Pain   Left Knee - Pain  Patient presents today for right wrist and left knee pain. She said that her right wrist has been hurting for at least 3 weeks. Her pain is located laterally. She said that it was hurting all the time, but helped  when she started to wear her velcro brace. It was initially swollen, but that has improved as well. She is right hand dominant with no injury. She states that her left knee has continued to bother her. She states that she saw Dr.Whitfield for this in the past. She has swelling  in her lower extremities, but has also been diagnosed with heart failure. She uses a walker to help off load her knee and that helps some. She takes Plavix and Aspirin.   HPI  Review of Systems   Objective: Vital Signs: Ht 5' 3.5" (1.613 m)   Wt 212 lb (96.2 kg)   BMI 36.97 kg/m   Physical Exam Constitutional:      Appearance: She is well-developed.  Pulmonary:     Effort: Pulmonary effort is normal.  Skin:    General: Skin is warm and dry.  Neurological:     Mental Status: She is alert and oriented to person, place, and time.  Psychiatric:        Behavior: Behavior normal.    Ortho Exam large knees.  No obvious effusion of left knee.  Full extension and flexed about 95 to 100 degrees without instability.  No calf pain.  Painless range of motion of both hips.  Right wrist with tenderness over the first dorsal extensor compartment with  positive Finkelstein's test consistent with de Quervain's.  Hypertrophic changes at the base of the thumb with some mild pain with positive grind test.  Good capillary refill  Specialty Comments:  No specialty comments available.  Imaging: XR KNEE 3 VIEW LEFT  Result Date: 11/21/2020 Films of the left knee were obtained in 3 projections standing.  There is bone-on-bone in the medial compartment with about 2 degrees of varus.  Diffuse decreased bone mineralization.  There are degenerative changes in the lateral compartment and patellofemoral joint as well with slight patella tilt.  Films are consistent with advanced osteoarthritis  XR Wrist 2 Views Right  Result Date: 11/21/2020 Films of the right wrist were obtained in several projections.  There are degenerative changes  at the base of the thumb metacarpal carpal joint with minimal discomfort.  There is partial subluxation and ectopic bone.  Negative ulnar variance and's very minimal calcification at the tip of the ulnar styloid.  No acute changes.  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[place]) 09/32/3557   Acute diastolic congestive heart failure ([place]) 12/25/2015   Dyspnea 11/22/2015   Adrenal adenoma 02/14/2015   Arthritis 02/14/2015   Diabetes ([place]) 02/14/2015   Diabetes mellitus type 2, uncontrolled ([place]) 02/14/2015   Essential (primary) hypertension 02/14/2015   HLD (hyperlipidemia) 02/14/2015   BP (high blood pressure) 02/14/2015   Disc disease with myelopathy, 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   Migraine aura without headache 02/14/2015   Nerve root pain 02/14/2015   Right hemisphere, 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[place]) 02/14/2015   Cerebral vascular accident ([place]) 02/14/2015   Benign essential HTN    Esophageal reflux    Hepatic hemangioma    Acute kidney injury (nontraumatic) ([place]) 01/18/2015   Diabetes mellitus type 2, controlled ([place]) 01/18/2015   Pain in the chest    Lumbar radiculopathy 12/02/2014   RBBB 08/02/2014   Cerebral atherosclerosis 12/08/2013   Obstructive apnea 11/26/2013   Cerebral infarct ([place]) 05/12/2013   Atherosclerosis of aorta ([place]) 04/12/2013   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   Cutaneous angiolipoma 04/12/2013   Chest pain 04/12/2013   Skin rash 01/15/2013   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/14/2012   Fatty tumor 09/09/2012   H/O malignant neoplasm of skin 09/09/2012   CVA (cerebral vascular accident) ([place]) 07/31/2012   Acquired  aphasia 05/25/2012   Neoplasm of uncertain behavior of skin 09/24/2011   Bilateral edema of lower extremity 09/09/2011   Diabetes mellitus, type 2 ([place] [facility]) 08/15/2011   Combined hyperlipidemia associated with type 2 diabetes mellitus ([place]) 08/15/2011   Carpal tunnel syndrome 08/07/2011   Lesion, thoracic root 08/07/2011   Cervical nerve root disorder 07/18/2011   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   Hypothyroidism 09/18/2010   Benign hypertensive heart disease without heart failure 09/18/2010   Dyslipidemia 09/18/2010   Type II or unspecified type diabetes mellitus without mention of complication, uncontrolled 09/18/2010   Hemangioma of liver 09/18/2010   Postmenopausal state 09/18/2010   Chest pain 09/18/2010   Dizziness 09/18/2010   Past Medical History:  Diagnosis Date   Acute diastolic (congestive) heart failure ([place]) 12/25/2015   Back pain    prior back surgery in 2009 - slow  to recover   Cancer ([facility] [facility])    Diabetes ([place])    Diverticulitis    Gastritis    Hemangioma of liver    managed conservatively; followed at [facility] (hyperlipidemia)    statin intolerant   Hypertension    Normal cardiac stress test 2011   Obesity    Thyroid disease    hypothyroidism    Family History  Problem Relation Age of Onset   Stroke Mother    Hypertension Mother    Heart disease Father    Heart attack Father    Dementia Father    Heart disease Brother    Heart attack Brother     Past Surgical History:  Procedure Laterality Date   ABDOMINAL HYSTERECTOMY     APPENDECTOMY     LEFT HEART CATH AND CORONARY ANGIOGRAPHY N/A 02/11/2018   Procedure: LEFT HEART CATH AND CORONARY ANGIOGRAPHY;  Surgeon: Troy Sine, MD;  Location: [facility];  Service: Cardiovascular;  Laterality: N/A;   NASAL RECONSTRUCTION     Social History   Occupational History   Not on file  Tobacco Use   Smoking status: Never   Smokeless tobacco: Never  Substance and Sexual Activity   Alcohol  use: No   Drug use: No   Sexual activity: Never

## 2020-12-06 DIAGNOSIS — D2239 Melanocytic nevi of other parts of face: Secondary | ICD-10-CM | POA: Diagnosis not present

## 2020-12-06 DIAGNOSIS — L821 Other seborrheic keratosis: Secondary | ICD-10-CM | POA: Diagnosis not present

## 2020-12-06 DIAGNOSIS — C44329 Squamous cell carcinoma of skin of other parts of face: Secondary | ICD-10-CM | POA: Diagnosis not present

## 2020-12-06 DIAGNOSIS — Z85828 Personal history of other malignant neoplasm of skin: Secondary | ICD-10-CM | POA: Diagnosis not present

## 2020-12-07 ENCOUNTER — Ambulatory Visit: Payer: Medicare Other | Admitting: Orthopaedic Surgery

## 2020-12-20 DIAGNOSIS — G5603 Carpal tunnel syndrome, bilateral upper limbs: Secondary | ICD-10-CM | POA: Diagnosis not present

## 2020-12-26 ENCOUNTER — Ambulatory Visit: Payer: Medicare Other | Admitting: Orthopaedic Surgery

## 2021-01-02 DIAGNOSIS — C44329 Squamous cell carcinoma of skin of other parts of face: Secondary | ICD-10-CM | POA: Diagnosis not present

## 2021-01-04 ENCOUNTER — Ambulatory Visit (INDEPENDENT_AMBULATORY_CARE_PROVIDER_SITE_OTHER): Payer: Medicare Other

## 2021-01-04 ENCOUNTER — Ambulatory Visit (INDEPENDENT_AMBULATORY_CARE_PROVIDER_SITE_OTHER): Payer: Medicare Other | Admitting: Orthopaedic Surgery

## 2021-01-04 ENCOUNTER — Telehealth: Payer: Self-pay

## 2021-01-04 ENCOUNTER — Encounter: Payer: Self-pay | Admitting: Orthopaedic Surgery

## 2021-01-04 VITALS — Ht 63.5 in | Wt 212.0 lb

## 2021-01-04 DIAGNOSIS — M25571 Pain in right ankle and joints of right foot: Secondary | ICD-10-CM

## 2021-01-04 DIAGNOSIS — M1712 Unilateral primary osteoarthritis, left knee: Secondary | ICD-10-CM | POA: Diagnosis not present

## 2021-01-04 DIAGNOSIS — G5603 Carpal tunnel syndrome, bilateral upper limbs: Secondary | ICD-10-CM | POA: Diagnosis not present

## 2021-01-04 NOTE — Telephone Encounter (Signed)
Please precert for left knee visco injections. This is Dr.Whitfield's patient. Thanks!

## 2021-01-04 NOTE — Progress Notes (Signed)
Office Visit Note   Patient: Whitney Newton           Date of Birth: [birth year]           MRN: [id number] Visit Date: 01/04/2021              Requested by: Whitney Newton, L.Whitney Newton, [place] [address] [place],  [place] [id number] PCP: Whitney Newton, L.Whitney Sa, MD   Assessment & Plan: Visit Diagnoses:  1. Pain in right ankle and joints of right foot   2. Carpal tunnel syndrome, bilateral   3. Unilateral primary osteoarthritis, left knee     Plan: Very has a number of issues that we discussed today over a long period of time.  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  Also has carpal tunnel on the right side.  She has been wearing splints for years and needs "new ones".  She is not interested in surgery.  She does have more trouble on the left than the right.  She also had de Quervain's on the right side but wearing the splint and the Voltaren gel is helping.  She has had some chronic problems with the lateral aspect of her right ankle that she thinks is related to neuropathy and to the issues she had with the lumbar spine.  She is diabetic and has had some difficulty regulating her sugars.  She has avoided cortisone.  X-rays today were negative so I suspect that a lot of the trouble she has been having is most likely related to her neuropathy or originates in her spine and she will follow-up with the neurosurgeon.  Whitney Newton also has osteoarthritis of her left knee.  She has had cortisone that provided significant relief but only for short period of time.  She is not interested in surgery and has multiple medical problems and would like to avoid surgery so we will pre-CERT viscosupplementation.  Will reapply new volar wrist splints and give her a prescription for a new rolling walker with 3 wheels  Follow-Up Instructions: Return Pre-CERT viscosupplementation.   Orders:   Orders Placed This Encounter  Procedures   XR Ankle Complete Right   No orders of the defined types were placed in this encounter.     Procedures: No procedures performed   Clinical Data: No additional findings.   Subjective: Chief Complaint  Patient presents with   Right Wrist - Pain, Follow-up   Left Knee - Pain, Follow-up   Right Ankle - Pain  Patient presents today for follow up on her left knee and right wrist. Whitney Newton injected her left knee on June 14th. She said that the injection only helped for one day. She has been continuing to struggle with lowering her sugars since the injection. She said that Whitney Newton mentioned about injecting her right wrist today but now feels like that may not be a great idea since her sugars keep running high. She had an EMG with [facility] this month. She was told she had severe carpal tunnel. She said bracing helps. She also mentioned that she has right ankle pain. It has been going on for 6-11month. The pain is located laterally and occurs randomly, but will wake her at night. She is diabetic.   HPI  Review of Systems   Objective: Vital Signs: Ht 5' 3.5" (1.613 m)   Wt 212 lb (96.2 kg)   BMI 36.97 kg/m  Physical Exam Constitutional:      Appearance: She is well-developed.  Pulmonary:     Effort: Pulmonary effort is normal.  Skin:    General: Skin is warm and dry.  Neurological:     Mental Status: She is alert and oriented to person, place, and time.  Psychiatric:        Behavior: Behavior normal.    Ortho Exam awake alert and oriented x3.  Comfortable sitting.  Does have some problems with hearing.  Uses a rolling walker to ambulate.  Bilateral lower extremity edema is chronic.  She does not have any lateral right ankle pain but notes that she has had some in the past but tickly at night and does have some altered feeling in her feet related to her back and her diabetic neuropathy.  No crepitation or tenderness about  the abdomen either malleolar eye or along the anterior aspect of her ankle.  Has good capillary refill to the toes.  Chronic medial joint pain left knee related to her osteoarthritis.  Full extension about 90 degrees of flexion.  She does have large legs.  No instability.  Left wrist with positive Phalen's over the median nerve and some altered sensibility in the tips of her fingers with good capillary refill.  She does have degenerative changes at the base of both of her thumbs and that we consider grip able to oppose thumb to little finger but I do think she has some atrophy of the thenar eminence.  Specialty Comments:  No specialty comments available.  Imaging: XR Ankle Complete Right  Result Date: 01/04/2021 Numbness of the left ankle pain 3 projections.  No evidence of any fracture.  Ankle joint spaces well-maintained.  No evidence of ectopic calcification or arthritis    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[place]) [id number]   Acute diastolic congestive heart failure ([place]) 12/25/2015   Dyspnea 11/22/2015   Adrenal adenoma 02/14/2015   Arthritis 02/14/2015   Diabetes ([place]) 02/14/2015   Diabetes mellitus type 2, uncontrolled ([place]) 02/14/2015   Essential (primary) hypertension 02/14/2015   HLD (hyperlipidemia) 02/14/2015   BP (high blood pressure) 02/14/2015   Disc disease with myelopathy, 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   Migraine aura without headache 02/14/2015   Nerve root pain 02/14/2015   Right hemisphere, 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[place]) 02/14/2015   Cerebral  vascular accident ([place]) 02/14/2015   Benign essential HTN    Esophageal reflux    Hepatic hemangioma    Acute kidney injury (nontraumatic) ([place]) 01/18/2015   Diabetes mellitus type 2, controlled ([place]) 01/18/2015   Pain in the chest    Lumbar radiculopathy 12/02/2014   RBBB 08/02/2014   Cerebral atherosclerosis 12/08/2013   Obstructive apnea 11/26/2013   Cerebral infarct ([place]) 05/12/2013   Atherosclerosis of aorta ([place]) 04/12/2013   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   Cutaneous angiolipoma 04/12/2013   Chest pain 04/12/2013   Skin rash 01/15/2013   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/14/2012   Fatty tumor 09/09/2012   H/O malignant neoplasm of skin 09/09/2012   CVA (cerebral vascular accident) ([place]) 07/31/2012  Acquired aphasia 05/25/2012   Neoplasm of uncertain behavior of skin 09/24/2011   Bilateral edema of lower extremity 09/09/2011   Diabetes mellitus, type 2 ([place]) 08/15/2011   Combined hyperlipidemia associated with type 2 diabetes mellitus ([place]) 08/15/2011   Carpal tunnel syndrome 08/07/2011   Lesion, thoracic root 08/07/2011   Cervical nerve root disorder 07/18/2011   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   Hypothyroidism 09/18/2010   Benign hypertensive heart disease without heart failure 09/18/2010   Dyslipidemia 09/18/2010   Type II or unspecified type diabetes mellitus without mention of complication, uncontrolled 09/18/2010   Hemangioma of liver 09/18/2010   Postmenopausal state 09/18/2010   Chest pain 09/18/2010   Dizziness 09/18/2010   Past Medical History:  Diagnosis Date   Acute diastolic (congestive) heart failure ([place]) 12/25/2015   Back pain    prior back surgery in 2009 - slow to recover   Cancer ([place])    Diabetes ([place])    Diverticulitis    Gastritis    Hemangioma of liver    managed conservatively; followed at [facility] (hyperlipidemia)    statin intolerant   Hypertension    Normal cardiac stress test 2011   Obesity    Thyroid disease     hypothyroidism    Family History  Problem Relation Age of Onset   Stroke Mother    Hypertension Mother    Heart disease Father    Heart attack Father    Dementia Father    Heart disease Brother    Heart attack Brother     Past Surgical History:  Procedure Laterality Date   ABDOMINAL HYSTERECTOMY     APPENDECTOMY     LEFT HEART CATH AND CORONARY ANGIOGRAPHY N/A 02/11/2018   Procedure: LEFT HEART CATH AND CORONARY ANGIOGRAPHY;  Surgeon: Troy Sine, MD;  Location: [facility];  Service: Cardiovascular;  Laterality: N/A;   NASAL RECONSTRUCTION     Social History   Occupational History   Not on file  Tobacco Use   Smoking status: Never   Smokeless tobacco: Never  Substance and Sexual Activity   Alcohol use: No   Drug use: No   Sexual activity: Never

## 2021-01-04 NOTE — Telephone Encounter (Signed)
Noted  

## 2021-01-23 ENCOUNTER — Ambulatory Visit (INDEPENDENT_AMBULATORY_CARE_PROVIDER_SITE_OTHER): Payer: Medicare Other | Admitting: Internal Medicine

## 2021-01-23 ENCOUNTER — Encounter: Payer: Self-pay | Admitting: Internal Medicine

## 2021-01-23 ENCOUNTER — Other Ambulatory Visit: Payer: Self-pay

## 2021-01-23 VITALS — BP 140/60 | HR 59 | Ht 63.5 in | Wt 211.2 lb

## 2021-01-23 DIAGNOSIS — Z789 Other specified health status: Secondary | ICD-10-CM

## 2021-01-23 DIAGNOSIS — I2510 Atherosclerotic heart disease of native coronary artery without angina pectoris: Secondary | ICD-10-CM

## 2021-01-23 DIAGNOSIS — I4510 Unspecified right bundle-branch block: Secondary | ICD-10-CM

## 2021-01-23 DIAGNOSIS — E785 Hyperlipidemia, unspecified: Secondary | ICD-10-CM | POA: Diagnosis not present

## 2021-01-23 DIAGNOSIS — I5032 Chronic diastolic (congestive) heart failure: Secondary | ICD-10-CM | POA: Diagnosis not present

## 2021-01-23 DIAGNOSIS — R0602 Shortness of breath: Secondary | ICD-10-CM | POA: Diagnosis not present

## 2021-01-23 DIAGNOSIS — G629 Polyneuropathy, unspecified: Secondary | ICD-10-CM

## 2021-01-23 MED ORDER — GABAPENTIN 300 MG PO CAPS: 300.0000 mg | ORAL_CAPSULE | Freq: Every day | ORAL | 3 refills | Status: DC

## 2021-01-23 NOTE — Progress Notes (Signed)
Cardiology Office Note   Date:  01/23/2021   ID:  Whitney Newton, DOB 05-27-1941, MRN [id number]  PCP:  Aurea Graff.Marlou Sa, MD   CC: Follow-up, leg edema  History of Present Illness: Whitney Newton is a 80 y.o. female who presents for  Four-month follow-up visit  This pleasant 80 year old woman is seen for a scheduled followup visit. The patient has a history of having had a stroke in early December 2014. She was at her diabetes clinic at [facility] and they diagnosed her and sent her straight to neurology where she was hospitalized for 3 days. The MRI showed a new stroke on the right side. She had carotid Dopplers which did not show any obstructive lesions but she did have some plaque and they put her back on aspirin and continued her Plavix. The patient had a echocardiogram which was a bubble study and did not show any evidence of right-to-left shunt.  More recently, she has had further neurology workup at [facility] on 06/20/14.  She had a transcranial Doppler.  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  Previous carotid Dopplers had not shown any significant external cranial disease  She has a history of essential hypertension, diabetes mellitus, and dyslipidemia. She has had atypical chest pain. She is intolerant of statin drugs. We updated her lexiscan Myoview stress test on 03/17/12 and it was normal showing no evidence of ischemia and her ejection fraction was 78%. The patient has never had a cardiac catheterization. She has continued to have some shortness of breath. She had an echocardiogram in 04/07/12 which showed an ejection fraction of 55-65% with grade 1 diastolic dysfunction. There is trivial aortic insufficiency.   The patient has a history of essential hypertension as well as atypical chest pain. Her diabetes is now being followed at  the [facility] clinic at [facility].  Since we last saw her she had an ophthalmology evaluation on 07/27/14 by Dr. Kathrin Penner and no diabetic retinopathy was found.  Her last chest x-ray was on 12/21/12 elsewhere and showed a normal heart size and low lung volumes. She had a recent CT scan of the chest on 03/23/13 at [facility] which showed normal heart size and demonstrated atherosclerosis of the aorta.   The patient has a past history of dermatofibrosarcoma of the spine. She had surgery for this at [facility]. She also has a past history of angiolipoma of the abdominal wall.   Since last visit she has not been experiencing any new cardiac symptoms.  She does have sleep apnea.  She uses a CPAP machine.  She has had some low back pain and pain in her hip radiating to her knees.  She has seen Dr. Durward Fortes. She was admitted to the hospital on 01/18/15 for chest and abdominal pain.  She had a CTA of the chest on 01/18/15 which was negative for pulmonary embolus.  The following day she had a Myoview on 01/19/15 which showed no ischemia and her ejection fraction was 85%.  She had a lot of side effects from the [facility].  It was felt in retrospect that her presenting complaints were probably GI in origin rather than cardiac.  She does have a history of GERD and sees Dr. Oletta Lamas.  since last visit she has had no new cardiac symptoms.  She has not been having any recent  chest discomfort. Her weight is unchanged and her blood pressures been stable.  11/22/2015  Whitney Newton presents today to establish care with me. She is a previous patient of Dr. Warren Danes. She [place] was at the beach and developed some worsening shortness of breath and leg swelling. Her nephew was there who came down from [place] with a head cold and she seems to have symptoms now at this time. She was also seen at an urgent care there and placed on amoxicillin. She's taken that for a few days. She notes significant  swelling in her weight is much higher than it had been in the past. She endorses a high salt intake. Her last echocardiogram was in 2013 which showed an EF of 55-65% with grade 1 diastolic dysfunction. She had a stress test in 2016 for chest pain which showed an EF of 85%.  12/25/2015  I saw Whitney Newton that today in the office. She has been taking the Lasix 40 mg daily which was over a week and this resulted in significant diuresis. Her weight had reduced from 202 down to 189. She then had to stop it for a few days because of some other medical problems are currently weight is up to 194. Her BNP associate with this was very low at 8 and renal function appeared to be stable. She reports a 75% improvement in breathing. Her repeat echo shows that EF remains stable with grade 1 diastolic dysfunction.  03/13/2016  Whitney Newton reports she's had a marked improvement in her swelling and breathing with additional diuretics. Weight has been fairly stable. She reports one episode of chest pain which was responsive to nitroglycerin. She's also had some reflux symptoms and is currently on Zegerid which appears to be helping her symptoms. Is not clear whether these episodes of chest discomfort are related to reflux or coronary ischemia. She did have a negative Myoview stress test in August 2016.  08/29/2016  Whitney Newton was seen today in the office. Over the past several day she's had worsening shortness of breath and lower extremity swelling. Her weight is now up about 6 pounds from her previous office visit. She said over the auscultation is been taking Lasix more regularly. She's currently taking 40 mg daily but as previously taken that for 3 days and alternating with 20 mg. She notes some improvement today after a couple days of diuretics.  10/08/2016  Whitney Newton returns today for follow-up. She has diuresed several pounds with increased dose diuretics are per creatinine rose and therefore I decreased her  diuretic back to 40 mg daily. She says she feels like she is over dried out on that dose of diuretic. She occasionally gets some cramping. She's also had some labile blood sugars which she attributes to the diuretics. In addition she had a recent bowel obstruction which may been related to diuresis.  03/06/2017  Alexius was seen today in follow-up. She recently got back from a trip to assess catch one with her husband. She said they were traveling for about 16 days and she did not take her Lasix during that period of time. Her weight is up about 6 pounds that she reports lower extremity swelling. She has a sliding scale Lasix to take but has not been compliant with that.  08/20/2017  Whitney Newton returns today for follow-up.  She has been having a lot of symptoms recently.  She is complaining of pain across her abdomen.  This is been further assessed and there  is a suggestion it could be related to back surgery.  She was also found to have mass on the liver and pancreas, thought to be hemangioma.  She is scheduled to have a CT scan of her abdomen next week.  She continues to gain weight.  Her weight is up 6 pounds since I saw her in September.  Which was 203 at the time.  Her weight had been as low as 195 last year.  She does report lower extremity edema and some worsening shortness of breath however she relates the shortness of breath more to her pain.  She had been on higher dose Lasix in the past but was noted to have some elevation in creatinine.  We did reduce the dose and she has been compliant taking 20 mg daily.  02/05/2018  Khalil returns for follow-up today.  Again she has numerous complaints however she is recently been having worsening chest discomfort.  She describes it as a squeezing or pressure in the center of her chest sometimes radiates up her neck.  May be associated with worsening shortness of breath.  Despite this she is taken extra Lasix and her weight is come down to her baseline of 203.  She has  not had symptomatic improvement.  She was scheduled to see me in a few weeks but got an earlier appointment because of her chest discomfort.  Sometimes her symptoms are associated with exertion and relieved by rest at other times can be present at rest or when awakening.  02/23/2018  Pete was seen today in follow-up of heart cath.  She underwent left heart catheterization on 02/11/2018.  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  LVEF was 65% with normal LVEDP 18 mmHg.  Post cath she still reports shortness of breath with certain activities.  She did have recent lipid testing which was as follows: LDL 148, Total chol 225, HDL 54, Non-HDL 171, TG 178.  Her goal LDL is less than 70 and she has not yet reach that target.  Unfortunately she has been statin intolerant and is a good candidate for PCSK9 inhibitor.  05/22/2018  Trezure is seen today in follow-up.  She was started on Repatha which she seems to be tolerating well.  She has been on the medicine now for about 3 months and is getting it from free with support from [place].  Recently she had transcranial Dopplers at [facility] which actually shows some mild improvement in her intracranial atherosclerosis.  This could be related to her aggressive lipid therapy.  She was supposed to have repeat labs prior to this visit however it was not performed.  We will need to order a repeat lipid profile in order to reassess her therapy so far.  08/10/2018  Namiyah returns today for follow-up.  She has been doing well with Repatha although has had several injections which have possibly failed.  She seems to be injecting on the anterior thigh and has had some bleeding and bruising.  She also has trouble depressing the injector and noted some leakage of the medication.   I did perform some extra training with her today and helped describe to her a better way to hold the pen as well as a better injection site which I think should improve her drug delivery.  That being said  in December, her total cholesterol come down significantly to 104, with HDL 62, LDL 26 and triglycerides of 81.  Hemoglobin A1c remains persistently elevated at 8.2.  04/16/2019  Raynna is seen today in follow-up.  She thinks she might of had another stroke in May.  She had some word finding difficulties.  She is being followed by neurology at [facility] and had a recent transcranial Doppler and may have an MRI because she is been having some numbness and tingling in her arms.  She seems to be tolerating Repatha.  Her labs are markedly improved.  Total cholesterol now 104, triglycerides 81, HDL 62 and LDL 26.  No significant side effects with it.  10/22/2019  Whitney Newton returns today for follow-up with her husband.  She denies any new stroke symptoms.  She is responded well to Repatha and although her LDL was down as low as 14 it is now come up to 49 about 6 months ago.  Again this is well controlled however the increase is due to dietary changes.  She says she is eating more pizza, bacon and eggs and other saturated fats.  In addition she has gained weight.  There is also lower extremity edema.  She reports lack of compliance with the Lasix.  She says she may go several days without taking any and then has to take several days worth.  She has had issues with incontinence and a spastic bladder.  04/19/2020  Whitney Newton returns today with her husband.  Overall she reports some worsening shortness of breath.  She has had lower extremity edema and weight gain despite taking her Lasix more regularly.  She takes 40 mg daily.  Weight is up again another few pounds.  She is not had a repeat echo since 2017 which showed normal LVEF at the time but diastolic dysfunction.  01/24/2021  Whitney Newton returns today in follow-up.  She has a  number of different complaints today.  She is really struggling with fatigue and some daytime somnolence.  She says she has trouble sleeping at night.  I think she is struggling with neuropathy.  She has lower extremity edema but recently we had increased her diuretics.  Her BNP was low and I recommended instead of 40 twice daily to go to 60 mg Lasix daily.  Potassium and electrolytes are normal however she has had leg cramps and restlessness mostly at night.  Past Medical History:  Diagnosis Date   Acute diastolic (congestive) heart failure ([place]) 12/25/2015   Back pain    prior back surgery in 2009 - slow to recover   Cancer ([place])    Diabetes ([place])    Diverticulitis    Gastritis    Hemangioma of liver    managed conservatively; followed at [place] (hyperlipidemia)    statin intolerant   Hypertension    Normal cardiac stress test 2011   Obesity    Thyroid disease    hypothyroidism    Past Surgical History:  Procedure Laterality Date   ABDOMINAL HYSTERECTOMY     APPENDECTOMY     LEFT HEART CATH AND CORONARY ANGIOGRAPHY N/A 02/11/2018   Procedure: LEFT HEART CATH AND CORONARY ANGIOGRAPHY;  Surgeon: Troy Sine, MD;  Location: [place] [facility];  Service: Cardiovascular;  Laterality: N/A;   NASAL RECONSTRUCTION       Current Outpatient Medications  Medication Sig Dispense Refill   aspirin 81 MG tablet Take 81 mg by mouth daily.     bisoprolol-hydrochlorothiazide [facility]) 10-6.25  MG tablet Take 1 tablet by mouth daily. 90 tablet 1   clopidogrel (PLAVIX) 75 MG tablet Take 1 tablet (75 mg total) by mouth daily. 90 tablet 1   colesevelam (WELCHOL) 625 MG tablet Take 3 tablets (1,875 mg total) by mouth 2 (two) times daily as needed (cholesterol). 180 tablet 6   diclofenac sodium (VOLTAREN) 1 % GEL Apply 1 application topically at bedtime as needed (for pain).     Evolocumab (REPATHA) 140 MG/ML SOSY INJECT 1 pen  SUBCUTANEOUSLY EVERY TWO WEEKS 2 mL 11   furosemide (LASIX) 40 MG  tablet Take 1.5 tablets (60 mg total) by mouth daily. 135 tablet 3   gabapentin (NEURONTIN) 100 MG capsule Take 200 mg by mouth at bedtime.     Glucagon 0.5 MG/0.1ML SOAJ glucagon (human recombinant) 1 mg solution for injection  Take by injection route.     HUMALOG KWIKPEN 100 UNIT/ML KiwkPen Inject 14 Units into the skin See admin instructions. Inject 11 units SQ at breakfast, inject 14 units SQ at lunch and inject 16 units SQ at supper. Plus sliding scale 1 unit for every 25 BS is > 120     hyoscyamine (LEVBID) 0.375 MG 12 hr tablet Take 0.375 mg by mouth every 12 (twelve) hours as needed (cramps).      levothyroxine (SYNTHROID, LEVOTHROID) 88 MCG tablet Take 88 mcg by mouth daily.      Liniments (SALONPAS EX) Apply 1 patch topically daily as needed (for pain).     losartan (COZAAR) 25 MG tablet TAKE 1 TABLET ONCE DAILY. 90 tablet 1   meclizine (ANTIVERT) 12.5 MG tablet Take 1 tablet (12.5 mg total) by mouth 3 (three) times daily as needed for dizziness. 30 tablet 0   nitroGLYCERIN (NITROSTAT) 0.4 MG SL tablet Place 1 tablet (0.4 mg total) under the tongue every 5 (five) minutes as needed for chest pain. 25 tablet 2   ondansetron (ZOFRAN ODT) 4 MG disintegrating tablet Take 1 tablet (4 mg total) by mouth every 8 (eight) hours as needed for nausea or vomiting. 20 tablet 0   potassium chloride SA (KLOR-CON) 20 MEQ tablet Take 2 tablets (40 mEq total) by mouth daily. 180 tablet 1   TOUJEO SOLOSTAR 300 UNIT/ML SOPN Inject 22 Units into the skin.      No current facility-administered medications for this visit.    Allergies:   Celecoxib, Lipitor [atorvastatin calcium], Zetia [ezetimibe], Ropinirole hcl, Actos [pioglitazone hydrochloride], Crestor [rosuvastatin calcium], Doxycycline, Duloxetine, Fenofibrate, Fenofibrate, Imdur [isosorbide nitrate], Isosorbide, Metformin and related, Niaspan [niacin], Pravachol, Pravachol [pravastatin], Ranolazine er, Ropinirole, Rosuvastatin, Niaspan [niacin er], and  Ranolazine    Social History:  The patient  reports that she has never smoked. She has never used smokeless tobacco. She reports that she does not drink alcohol and does not use drugs.   Family History:  The patient's family history includes Dementia in her father; Heart attack in her brother and father; Heart disease in her brother and father; Hypertension in her mother; Stroke in her mother.    ROS:   Pertinent items noted in HPI and remainder of comprehensive ROS otherwise negative.  PHYSICAL EXAM: VS:  BP 140/60 (BP Location: Right Arm)   Pulse (!) 59   Ht 5' 3.5" (1.613 m)   Wt 211 lb 3.2 oz (95.8 kg)   SpO2 99%   BMI 36.83 kg/m  , BMI Body mass index is 36.83 kg/m. General appearance: alert, no distress and moderately obese Neck: no carotid bruit and no  JVD Lungs: diminished breath sounds bilaterally Heart: regular rate and rhythm Abdomen: soft, non-tender; bowel sounds normal; no masses,  no organomegaly Extremities: edema 1+ pedal edema Pulses: 2+ and symmetric Skin: Skin color, texture, turgor normal. No rashes or lesions Neurologic: Grossly normal Psych: Mildly anxious  EKG:   Sinus bradycardia at 59, RBBB-personally reviewed  Recent Labs: 04/19/2020: BNP 46.9 08/28/2020: ALT 17; BUN 23; Creatinine, Ser 0.80; Hemoglobin 13.6; Platelets 236; Potassium 3.9; Sodium 142    Lipid Panel    Component Value Date/Time   CHOL 119 04/19/2020 1123   TRIG 98 04/19/2020 1123   HDL 70 04/19/2020 1123   CHOLHDL 1.7 04/19/2020 1123   CHOLHDL 3.8 11/15/2015 0954   VLDL 20 11/15/2015 0954   LDLCALC 31 04/19/2020 1123   LDLDIRECT 148.7 09/14/2012 0857      Wt Readings from Last 3 Encounters:  01/23/21 211 lb 3.2 oz (95.8 kg)  01/04/21 212 lb (96.2 kg)  11/21/20 212 lb (96.2 kg)     ASSESSMENT AND PLAN:  Chronic diastolic congestive heart failure Hypertensive heart disease without heart failure Diabetes mellitus - on insulin Atypical chest pain with normal Myoview  stress test in August 2016.  No ischemia.  Ejection fraction 85%. old CVA followed at [facility] History of dermatofibro- sarcoma of the spine. Dyslipidemia Osteoarthritis OSA on CPAP [facility]) Hypothyroidism followed at [facility]. GERD, followed by Dr. Oletta Lamas  RBBB Peripheral neuropathy   PLAN:  Mrs. Renck has chronic diastolic heart failure however also carries a lot of fluid in her extremities which I think is more related to neuropathy.  She is unable to wear compression stockings.  I have advised her to try to elevate that is much as possible.  Her recent labs after an increase in her Lasix showed very low BNP and I am hesitant to further uptitrate her diuretics due to concern about dehydration.  Echo showed normal systolic function.  She is complaining of restlessness and cramping mostly at night.  She may benefit from more gabapentin.  I advised her to increase from 200 to 300 mg daily and will prescribe her 300 mg capsule that she can take every night.  Follow-up in 6 months or sooner as necessary  Pixie Casino, MD, [facility], [place] Director of the Advanced Lipid Disorders &  Cardiovascular Risk Reduction Clinic Diplomate of the American Board of Clinical Lipidology Attending Cardiologist  Direct Dial: [phone]  Fax: [phone]  Website:  [email or web address]   01/23/2021 4:26 PM

## 2021-01-23 NOTE — Patient Instructions (Signed)
Medication Instructions:   INCREASE GABAPENTIN TO 300 MG AT BEDTIME=3 OF THE 100 MG TABLETS ONCE DAILY AT BEDTIME  *If you need a refill on your cardiac medications before your next appointment, please call your pharmacy*   Follow-Up: At [facility], you and your health needs are our priority.  As part of our continuing mission to provide you with exceptional heart care, we have created designated Provider Care Teams.  These Care Teams include your primary Cardiologist (physician) and Advanced Practice Providers (APPs -  Physician Assistants and Nurse Practitioners) who all work together to provide you with the care you need, when you need it.  We recommend signing up for the patient portal called "MyChart".  Sign up information is provided on this After Visit Summary.  MyChart is used to connect with patients for Virtual Visits (Telemedicine).  Patients are able to view lab/test results, encounter notes, upcoming appointments, etc.  Non-urgent messages can be sent to your provider as well.   To learn more about what you can do with MyChart, go to [email or web address].    Your next appointment:   6 month(s)  The format for your next appointment:   In Person  Provider:   K. Mali Hilty, MD

## 2021-02-06 ENCOUNTER — Telehealth: Payer: Self-pay

## 2021-02-06 NOTE — Telephone Encounter (Signed)
VOB submitted for Orthovisc, left knee. Pending BV.

## 2021-02-15 ENCOUNTER — Other Ambulatory Visit: Payer: Self-pay | Admitting: Internal Medicine

## 2021-02-19 ENCOUNTER — Telehealth: Payer: Self-pay

## 2021-02-19 NOTE — Telephone Encounter (Signed)
Approved for Orthovisc, left knee. [place] deductible has been met [employer], will cover the remaining at 100%. No Co-pay No PA required  Appt. 02/22/2021 with Dr. Durward Fortes

## 2021-02-21 DIAGNOSIS — Z483 Aftercare following surgery for neoplasm: Secondary | ICD-10-CM | POA: Diagnosis not present

## 2021-02-21 DIAGNOSIS — L821 Other seborrheic keratosis: Secondary | ICD-10-CM | POA: Diagnosis not present

## 2021-02-22 ENCOUNTER — Other Ambulatory Visit: Payer: Self-pay

## 2021-02-22 ENCOUNTER — Encounter: Payer: Self-pay | Admitting: Orthopaedic Surgery

## 2021-02-22 ENCOUNTER — Ambulatory Visit (INDEPENDENT_AMBULATORY_CARE_PROVIDER_SITE_OTHER): Payer: Medicare Other | Admitting: Orthopaedic Surgery

## 2021-02-22 DIAGNOSIS — M1712 Unilateral primary osteoarthritis, left knee: Secondary | ICD-10-CM

## 2021-02-22 DIAGNOSIS — R21 Rash and other nonspecific skin eruption: Secondary | ICD-10-CM

## 2021-02-22 NOTE — Progress Notes (Signed)
Office Visit Note   Patient: Whitney Newton           Date of Birth: Apr 16, 1941           MRN: [id number] Visit Date: 02/22/2021              Requested by: Alroy Dust, L.Marlou Sa, [place] [address] [place],  [place] [address] PCP: Alroy Dust, L.Marlou Sa, MD   Assessment & Plan: Visit Diagnoses:  1. Unilateral primary osteoarthritis, left knee   2. Skin rash     Plan: First Orthovisc injection for osteoarthritis left knee.  Return weekly for the next 2 weeks to complete the series  Follow-Up Instructions: Return in about 1 week (around 03/01/2021).   Orders:  No orders of the defined types were placed in this encounter.  No orders of the defined types were placed in this encounter.     Procedures: No procedures performed   Clinical Data: No additional findings.   Subjective: Chief Complaint  Patient presents with   Left Knee - Follow-up    Orthovisc #1  Patient presents today for follow up on her left knee. She is here to get her first orthovisc injection in her left knee.   HPI  Review of Systems   Objective: Vital Signs: There were no vitals taken for this visit.  Physical Exam  Ortho Exam large knees.  Left knee was not hot warm or red.  No obvious effusion full extension and flexed about 95 degrees  Specialty Comments:  No specialty comments available.  Imaging: No results found.   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[place]) [id number]   Acute diastolic congestive heart failure ([place]) 12/25/2015   Dyspnea 11/22/2015    Adrenal adenoma 02/14/2015   Arthritis 02/14/2015   Diabetes ([place]) 02/14/2015   Diabetes mellitus type 2, uncontrolled ([place]) 02/14/2015   Essential (primary) hypertension 02/14/2015   HLD (hyperlipidemia) 02/14/2015   BP (high blood pressure) 02/14/2015   Disc disease with myelopathy, 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   Migraine aura without headache 02/14/2015   Nerve root pain 02/14/2015   Right hemisphere, 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[place]) 02/14/2015   Cerebral vascular accident ([place]) 02/14/2015   Benign essential HTN    Esophageal reflux    Hepatic hemangioma    Acute kidney injury (nontraumatic) ([place]) 01/18/2015   Diabetes mellitus type 2, controlled ([place]) 01/18/2015   Pain in the chest    Lumbar radiculopathy 12/02/2014   RBBB 08/02/2014   Cerebral atherosclerosis 12/08/2013   Obstructive apnea 11/26/2013   Cerebral infarct ([place]) 05/12/2013   Atherosclerosis of aorta ([place]) 04/12/2013   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   Cutaneous angiolipoma 04/12/2013   Chest pain 04/12/2013   Skin rash 01/15/2013   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/14/2012   Fatty tumor 09/09/2012   H/O malignant neoplasm of skin 09/09/2012   CVA (cerebral vascular accident) ([place]) 07/31/2012   Acquired aphasia 05/25/2012   Neoplasm of uncertain behavior of skin 09/24/2011   Bilateral edema of lower extremity 09/09/2011   Diabetes mellitus, type 2 ([place]) 08/15/2011   Combined  hyperlipidemia associated with type 2 diabetes mellitus ([place]) 08/15/2011   Carpal tunnel syndrome 08/07/2011   Lesion, thoracic root 08/07/2011   Cervical nerve root disorder 07/18/2011   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[place]) 07/18/2011   Hypothyroidism 09/18/2010   Benign hypertensive heart disease without heart failure 09/18/2010   Dyslipidemia 09/18/2010   Type II or unspecified type diabetes mellitus without mention of complication, uncontrolled 09/18/2010   Hemangioma of liver 09/18/2010   Postmenopausal state 09/18/2010   Chest pain 09/18/2010    Dizziness 09/18/2010   Past Medical History:  Diagnosis Date   Acute diastolic (congestive) heart failure ([place]) 12/25/2015   Back pain    prior back surgery in 2009 - slow to recover   Cancer ([place])    Diabetes ([place])    Diverticulitis    Gastritis    Hemangioma of liver    managed conservatively; followed at [facility] (hyperlipidemia)    statin intolerant   Hypertension    Normal cardiac stress test 2011   Obesity    Thyroid disease    hypothyroidism    Family History  Problem Relation Age of Onset   Stroke Mother    Hypertension Mother    Heart disease Father    Heart attack Father    Dementia Father    Heart disease Brother    Heart attack Brother     Past Surgical History:  Procedure Laterality Date   ABDOMINAL HYSTERECTOMY     APPENDECTOMY     LEFT HEART CATH AND CORONARY ANGIOGRAPHY N/A 02/11/2018   Procedure: LEFT HEART CATH AND CORONARY ANGIOGRAPHY;  Surgeon: Troy Sine, MD;  Location: [facility];  Service: Cardiovascular;  Laterality: N/A;   NASAL RECONSTRUCTION     Social History   Occupational History   Not on file  Tobacco Use   Smoking status: Never   Smokeless tobacco: Never  Substance and Sexual Activity   Alcohol use: No   Drug use: No   Sexual activity: Never

## 2021-03-01 ENCOUNTER — Other Ambulatory Visit: Payer: Self-pay

## 2021-03-01 ENCOUNTER — Ambulatory Visit (INDEPENDENT_AMBULATORY_CARE_PROVIDER_SITE_OTHER): Payer: Medicare Other | Admitting: Orthopaedic Surgery

## 2021-03-01 ENCOUNTER — Encounter: Payer: Self-pay | Admitting: Orthopaedic Surgery

## 2021-03-01 DIAGNOSIS — M1712 Unilateral primary osteoarthritis, left knee: Secondary | ICD-10-CM | POA: Diagnosis not present

## 2021-03-01 MED ORDER — HYALURONAN 30 MG/2ML IX SOSY
30.0000 mg | PREFILLED_SYRINGE | INTRA_ARTICULAR | Status: AC | PRN
  Administered 2021-03-01: 30 mg via INTRA_ARTICULAR

## 2021-03-01 NOTE — Progress Notes (Signed)
Office Visit Note   Patient: Whitney Newton           Date of Birth: 05-26-1941           MRN: [id number] Visit Date: 03/01/2021              Requested by: Whitney Newton, L.Marlou Sa, [place] [address] [place],  [place] [address] PCP: Whitney Newton, L.Marlou Sa, MD   Assessment & Plan: Visit Diagnoses:  1. Unilateral primary osteoarthritis, left knee     Plan: Second Orthovisc injection left knee.  [name] feels like the first injection made a difference with her knee being less tight and more mobile  Follow-Up Instructions: Return in about 2 weeks (around 03/15/2021).   Orders:  No orders of the defined types were placed in this encounter.  No orders of the defined types were placed in this encounter.     Procedures: Large Joint Inj: L knee on 03/01/2021 4:40 PM Indications: pain and joint swelling Details: 25 G 1.5 in needle, anteromedial approach  Arthrogram: No  Medications: 30 mg Hyaluronan 30 MG/2ML Outcome: tolerated well, no immediate complications Procedure, treatment alternatives, risks and benefits explained, specific risks discussed. Consent was given by the patient. Immediately prior to procedure a time out was called to verify the correct patient, procedure, equipment, support staff and site/side marked as required. Patient was prepped and draped in the usual sterile fashion.      Clinical Data: No additional findings.   Subjective: Chief Complaint  Patient presents with   Left Knee - Follow-up    Orthovisc started 02/22/2021  Patient presents today for the second Orthovisc injection in her left knee. She started the series on 02/22/2021.  HPI  Review of Systems   Objective: Vital Signs: There were no vitals taken for this visit.  Physical Exam  Ortho Exam left knee was not hot red warm or swollen.  No effusion and minimal medial joint pain  Specialty Comments:  No specialty comments available.  Imaging: No results found.   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[place]) 42/70/6237   Acute diastolic congestive heart failure ([place]) 12/25/2015   Dyspnea 11/22/2015   Adrenal adenoma 02/14/2015   Arthritis 02/14/2015   Diabetes ([place]) 02/14/2015   Diabetes mellitus type 2, uncontrolled ([place]) 02/14/2015   Essential (primary) hypertension 02/14/2015   HLD (hyperlipidemia) 02/14/2015   BP (high blood pressure) 02/14/2015   Disc disease with myelopathy, 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   Migraine aura without headache 02/14/2015   Nerve root pain 02/14/2015   Right hemisphere, 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[place]) 02/14/2015   Cerebral vascular accident ([place]) 02/14/2015   Benign essential HTN    Esophageal reflux    Hepatic hemangioma    Acute kidney injury (nontraumatic) ([place]) 01/18/2015   Diabetes mellitus type 2, controlled ([place]) 01/18/2015   Pain in the chest    Lumbar radiculopathy 12/02/2014   RBBB 08/02/2014   Cerebral atherosclerosis 12/08/2013   Obstructive apnea 11/26/2013   Cerebral infarct ([place]) 05/12/2013   Atherosclerosis of aorta ([place]) 04/12/2013  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   Cutaneous angiolipoma 04/12/2013   Chest pain 04/12/2013   Skin rash 01/15/2013   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/14/2012   Fatty tumor 09/09/2012   H/O malignant neoplasm of skin 09/09/2012   CVA (cerebral vascular accident) ([place]) 07/31/2012   Acquired aphasia 05/25/2012   Neoplasm of uncertain behavior of skin 09/24/2011   Bilateral edema of lower extremity 09/09/2011   Diabetes mellitus, type 2 ([place])  08/15/2011   Combined hyperlipidemia associated with type 2 diabetes mellitus ([place]) 08/15/2011   Carpal tunnel syndrome 08/07/2011   Lesion, thoracic root 08/07/2011   Cervical nerve root disorder 07/18/2011   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) 12/25/2015   Back pain    prior back surgery in 2009 - slow to recover   Cancer ([place])    Diabetes ([place])    Diverticulitis    Gastritis    Hemangioma of liver    managed conservatively; followed at [facility] (hyperlipidemia)    statin intolerant   Hypertension    Normal cardiac stress test 2011   Obesity    Thyroid disease    hypothyroidism    Family History  Problem Relation Age of Onset   Stroke Mother    Hypertension Mother    Heart disease Father    Heart attack Father    Dementia Father    Heart disease Brother    Heart attack Brother     Past Surgical History:  Procedure Laterality Date   ABDOMINAL HYSTERECTOMY     APPENDECTOMY     LEFT HEART CATH AND CORONARY ANGIOGRAPHY N/A 02/11/2018   Procedure: LEFT HEART CATH AND CORONARY ANGIOGRAPHY;  Surgeon: Troy Sine, MD;  Location: [facility];  Service: Cardiovascular;  Laterality: N/A;   NASAL RECONSTRUCTION     Social History   Occupational History   Not on file  Tobacco Use   Smoking status: Never   Smokeless tobacco: Never  Substance and Sexual Activity   Alcohol use: No   Drug use: No   Sexual activity: Never

## 2021-03-08 ENCOUNTER — Ambulatory Visit: Payer: Medicare Other | Admitting: Orthopaedic Surgery

## 2021-03-13 ENCOUNTER — Ambulatory Visit (INDEPENDENT_AMBULATORY_CARE_PROVIDER_SITE_OTHER): Payer: Medicare Other | Admitting: Orthopaedic Surgery

## 2021-03-13 ENCOUNTER — Other Ambulatory Visit: Payer: Self-pay

## 2021-03-13 ENCOUNTER — Encounter: Payer: Self-pay | Admitting: Orthopaedic Surgery

## 2021-03-13 DIAGNOSIS — E1165 Type 2 diabetes mellitus with hyperglycemia: Secondary | ICD-10-CM | POA: Diagnosis not present

## 2021-03-13 DIAGNOSIS — Z8719 Personal history of other diseases of the digestive system: Secondary | ICD-10-CM | POA: Diagnosis not present

## 2021-03-13 DIAGNOSIS — Z794 Long term (current) use of insulin: Secondary | ICD-10-CM | POA: Diagnosis not present

## 2021-03-13 DIAGNOSIS — M1712 Unilateral primary osteoarthritis, left knee: Secondary | ICD-10-CM

## 2021-03-13 DIAGNOSIS — Z79899 Other long term (current) drug therapy: Secondary | ICD-10-CM | POA: Diagnosis not present

## 2021-03-13 MED ORDER — HYALURONAN 30 MG/2ML IX SOSY
30.0000 mg | PREFILLED_SYRINGE | INTRA_ARTICULAR | Status: AC | PRN
  Administered 2021-03-13: 30 mg via INTRA_ARTICULAR

## 2021-03-13 NOTE — Progress Notes (Signed)
Office Visit Note   Patient: Whitney Newton           Date of Birth: 11-15-1940           MRN: [id number] Visit Date: 03/13/2021              Requested by: Alroy Dust, L.Marlou Sa, [place] [address] [place],  [place] [address] PCP: Alroy Dust, L.Marlou Sa, MD   Assessment & Plan: Visit Diagnoses:  1. Unilateral primary osteoarthritis, left knee     Plan: Third and final Orthovisc injection left knee.  Seems to feel like it is made a difference.  We will plan to see back as needed  Follow-Up Instructions: Return if symptoms worsen or fail to improve.   Orders:  No orders of the defined types were placed in this encounter.  No orders of the defined types were placed in this encounter.     Procedures: Large Joint Inj: L knee on 03/13/2021 5:06 PM Indications: pain and joint swelling Details: 25 G 1.5 in needle, anteromedial approach  Arthrogram: No  Medications: 30 mg Hyaluronan 30 MG/2ML Outcome: tolerated well, no immediate complications Procedure, treatment alternatives, risks and benefits explained, specific risks discussed. Consent was given by the patient. Immediately prior to procedure a time out was called to verify the correct patient, procedure, equipment, support staff and site/side marked as required. Patient was prepped and draped in the usual sterile fashion.      Clinical Data: No additional findings.   Subjective: Chief Complaint  Patient presents with   Left Knee - Follow-up    Orthovisc  Patient presents today for the third Orthovisc injection in her left knee.  HPI  Review of Systems   Objective: Vital Signs: There were no vitals taken for this visit.  Physical Exam  Ortho Exam left knee was not hot red warm or swollen.  Mild medial joint pain.  Full extension flexed over 90 degrees without instability  Specialty Comments:  No specialty comments available.  Imaging: No results found.   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[place]) 51/76/1607   Acute diastolic congestive heart failure ([place]) 12/25/2015   Dyspnea 11/22/2015   Adrenal adenoma 02/14/2015   Arthritis 02/14/2015   Diabetes ([place]) 02/14/2015   Diabetes mellitus type 2, uncontrolled 02/14/2015   Essential (primary) hypertension 02/14/2015   HLD (hyperlipidemia) 02/14/2015   BP (high blood pressure) 02/14/2015   Disc disease with myelopathy, 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   Migraine aura without headache 02/14/2015   Nerve root pain 02/14/2015   Right hemisphere, 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[place]) 02/14/2015   Cerebral vascular accident ([place]) 02/14/2015   Benign essential HTN    Esophageal reflux    Hepatic hemangioma    Acute kidney injury (nontraumatic) ([place]) 01/18/2015   Diabetes mellitus type 2, controlled ([place]) 01/18/2015   Pain in the chest    Lumbar radiculopathy 12/02/2014   RBBB 08/02/2014   Cerebral atherosclerosis 12/08/2013   Obstructive apnea 11/26/2013   Cerebral infarct ([place]) 05/12/2013   Atherosclerosis of aorta ([place]) 04/12/2013  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   Cutaneous angiolipoma 04/12/2013   Chest pain 04/12/2013   Skin rash 01/15/2013   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/14/2012   Fatty tumor 09/09/2012   H/O malignant neoplasm of skin 09/09/2012   CVA (cerebral vascular accident) ([place]) 07/31/2012   Acquired aphasia 05/25/2012   Neoplasm of uncertain behavior of skin 09/24/2011   Bilateral edema of lower extremity 09/09/2011   Diabetes mellitus, type 2 ([place]) 08/15/2011   Combined hyperlipidemia associated with  type 2 diabetes mellitus ([place]) 08/15/2011   Carpal tunnel syndrome 08/07/2011   Lesion, thoracic root 08/07/2011   Cervical nerve root disorder 07/18/2011   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/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   Hypothyroidism 09/18/2010   Benign hypertensive heart disease without heart failure 09/18/2010   Dyslipidemia 09/18/2010   Type II or unspecified type diabetes mellitus without mention of complication, uncontrolled 09/18/2010   Hemangioma of liver 09/18/2010   Postmenopausal state 09/18/2010   Chest pain 09/18/2010   Dizziness 09/18/2010   Past Medical History:  Diagnosis Date   Acute diastolic (congestive) heart failure ([place]) 12/25/2015   Back pain    prior back surgery in 2009 - slow to recover   Cancer ([place])    Diabetes ([place])    Diverticulitis    Gastritis    Hemangioma of liver    managed conservatively; followed at [facility] (hyperlipidemia)    statin intolerant   Hypertension    Normal cardiac stress test 2011   Obesity    Thyroid disease    hypothyroidism    Family History  Problem Relation Age of Onset   Stroke Mother    Hypertension Mother    Heart disease Father    Heart attack Father    Dementia Father    Heart disease Brother    Heart attack Brother     Past Surgical History:  Procedure Laterality Date   ABDOMINAL HYSTERECTOMY     APPENDECTOMY     LEFT HEART CATH AND CORONARY ANGIOGRAPHY N/A 02/11/2018   Procedure: LEFT HEART CATH AND CORONARY ANGIOGRAPHY;  Surgeon: Troy Sine, MD;  Location: [facility];  Service: Cardiovascular;  Laterality: N/A;   NASAL RECONSTRUCTION     Social History   Occupational History   Not on file  Tobacco Use   Smoking status: Never   Smokeless tobacco: Never  Substance and Sexual Activity   Alcohol use: No   Drug use: No   Sexual activity: Never

## 2021-03-28 DIAGNOSIS — G4733 Obstructive sleep apnea (adult) (pediatric): Secondary | ICD-10-CM | POA: Diagnosis not present

## 2021-04-06 DIAGNOSIS — R350 Frequency of micturition: Secondary | ICD-10-CM | POA: Diagnosis not present

## 2021-04-06 DIAGNOSIS — Z23 Encounter for immunization: Secondary | ICD-10-CM | POA: Diagnosis not present

## 2021-04-06 DIAGNOSIS — K589 Irritable bowel syndrome without diarrhea: Secondary | ICD-10-CM | POA: Diagnosis not present

## 2021-04-06 DIAGNOSIS — R8290 Unspecified abnormal findings in urine: Secondary | ICD-10-CM | POA: Diagnosis not present

## 2021-04-06 DIAGNOSIS — Z0000 Encounter for general adult medical examination without abnormal findings: Secondary | ICD-10-CM | POA: Diagnosis not present

## 2021-04-10 ENCOUNTER — Other Ambulatory Visit: Payer: Self-pay | Admitting: Internal Medicine

## 2021-04-23 DIAGNOSIS — Z23 Encounter for immunization: Secondary | ICD-10-CM | POA: Diagnosis not present

## 2021-04-24 DIAGNOSIS — G629 Polyneuropathy, unspecified: Secondary | ICD-10-CM | POA: Diagnosis not present

## 2021-04-24 DIAGNOSIS — G5603 Carpal tunnel syndrome, bilateral upper limbs: Secondary | ICD-10-CM | POA: Diagnosis not present

## 2021-04-24 DIAGNOSIS — M5414 Radiculopathy, thoracic region: Secondary | ICD-10-CM | POA: Diagnosis not present

## 2021-04-24 DIAGNOSIS — Z7409 Other reduced mobility: Secondary | ICD-10-CM | POA: Diagnosis not present

## 2021-04-24 DIAGNOSIS — I6601 Occlusion and stenosis of right middle cerebral artery: Secondary | ICD-10-CM | POA: Diagnosis not present

## 2021-04-24 DIAGNOSIS — I771 Stricture of artery: Secondary | ICD-10-CM | POA: Diagnosis not present

## 2021-04-24 DIAGNOSIS — R531 Weakness: Secondary | ICD-10-CM | POA: Diagnosis not present

## 2021-04-24 DIAGNOSIS — M5186 Other intervertebral disc disorders, lumbar region: Secondary | ICD-10-CM | POA: Diagnosis not present

## 2021-04-24 DIAGNOSIS — G5600 Carpal tunnel syndrome, unspecified upper limb: Secondary | ICD-10-CM | POA: Diagnosis not present

## 2021-04-24 DIAGNOSIS — Z79899 Other long term (current) drug therapy: Secondary | ICD-10-CM | POA: Diagnosis not present

## 2021-04-24 DIAGNOSIS — Z8673 Personal history of transient ischemic attack (TIA), and cerebral infarction without residual deficits: Secondary | ICD-10-CM | POA: Diagnosis not present

## 2021-05-23 DIAGNOSIS — D171 Benign lipomatous neoplasm of skin and subcutaneous tissue of trunk: Secondary | ICD-10-CM | POA: Diagnosis not present

## 2021-05-23 DIAGNOSIS — L821 Other seborrheic keratosis: Secondary | ICD-10-CM | POA: Diagnosis not present

## 2021-05-23 DIAGNOSIS — Z85828 Personal history of other malignant neoplasm of skin: Secondary | ICD-10-CM | POA: Diagnosis not present

## 2021-05-27 DIAGNOSIS — K869 Disease of pancreas, unspecified: Secondary | ICD-10-CM | POA: Diagnosis not present

## 2021-05-27 DIAGNOSIS — E1142 Type 2 diabetes mellitus with diabetic polyneuropathy: Secondary | ICD-10-CM | POA: Diagnosis not present

## 2021-05-27 DIAGNOSIS — R4020 Unspecified coma: Secondary | ICD-10-CM | POA: Diagnosis not present

## 2021-05-27 DIAGNOSIS — I5030 Unspecified diastolic (congestive) heart failure: Secondary | ICD-10-CM | POA: Diagnosis not present

## 2021-05-27 DIAGNOSIS — Z7982 Long term (current) use of aspirin: Secondary | ICD-10-CM | POA: Diagnosis not present

## 2021-05-27 DIAGNOSIS — Z8673 Personal history of transient ischemic attack (TIA), and cerebral infarction without residual deficits: Secondary | ICD-10-CM | POA: Diagnosis not present

## 2021-05-27 DIAGNOSIS — E7800 Pure hypercholesterolemia, unspecified: Secondary | ICD-10-CM | POA: Diagnosis not present

## 2021-05-27 DIAGNOSIS — I110 Hypertensive heart disease with heart failure: Secondary | ICD-10-CM | POA: Diagnosis not present

## 2021-05-27 DIAGNOSIS — K219 Gastro-esophageal reflux disease without esophagitis: Secondary | ICD-10-CM | POA: Diagnosis not present

## 2021-05-27 DIAGNOSIS — Z794 Long term (current) use of insulin: Secondary | ICD-10-CM | POA: Diagnosis not present

## 2021-05-27 DIAGNOSIS — R55 Syncope and collapse: Secondary | ICD-10-CM | POA: Diagnosis not present

## 2021-05-27 DIAGNOSIS — I6521 Occlusion and stenosis of right carotid artery: Secondary | ICD-10-CM | POA: Diagnosis not present

## 2021-05-27 DIAGNOSIS — I959 Hypotension, unspecified: Secondary | ICD-10-CM | POA: Diagnosis not present

## 2021-05-27 DIAGNOSIS — R531 Weakness: Secondary | ICD-10-CM | POA: Diagnosis not present

## 2021-05-27 DIAGNOSIS — R001 Bradycardia, unspecified: Secondary | ICD-10-CM | POA: Diagnosis not present

## 2021-05-27 DIAGNOSIS — Z79899 Other long term (current) drug therapy: Secondary | ICD-10-CM | POA: Diagnosis not present

## 2021-05-27 DIAGNOSIS — E119 Type 2 diabetes mellitus without complications: Secondary | ICD-10-CM | POA: Diagnosis not present

## 2021-05-27 DIAGNOSIS — E039 Hypothyroidism, unspecified: Secondary | ICD-10-CM | POA: Diagnosis not present

## 2021-05-27 DIAGNOSIS — G4733 Obstructive sleep apnea (adult) (pediatric): Secondary | ICD-10-CM | POA: Diagnosis not present

## 2021-05-27 DIAGNOSIS — Z7409 Other reduced mobility: Secondary | ICD-10-CM | POA: Diagnosis not present

## 2021-05-27 DIAGNOSIS — R0602 Shortness of breath: Secondary | ICD-10-CM | POA: Diagnosis not present

## 2021-05-27 DIAGNOSIS — Z7902 Long term (current) use of antithrombotics/antiplatelets: Secondary | ICD-10-CM | POA: Diagnosis not present

## 2021-05-27 DIAGNOSIS — E538 Deficiency of other specified B group vitamins: Secondary | ICD-10-CM | POA: Diagnosis not present

## 2021-05-27 DIAGNOSIS — E785 Hyperlipidemia, unspecified: Secondary | ICD-10-CM | POA: Diagnosis not present

## 2021-05-27 DIAGNOSIS — I6601 Occlusion and stenosis of right middle cerebral artery: Secondary | ICD-10-CM | POA: Diagnosis not present

## 2021-05-27 DIAGNOSIS — I4510 Unspecified right bundle-branch block: Secondary | ICD-10-CM | POA: Diagnosis not present

## 2021-05-27 DIAGNOSIS — S36299A Other injury of unspecified part of pancreas, initial encounter: Secondary | ICD-10-CM | POA: Diagnosis not present

## 2021-05-27 DIAGNOSIS — I119 Hypertensive heart disease without heart failure: Secondary | ICD-10-CM | POA: Diagnosis not present

## 2021-05-27 DIAGNOSIS — R0902 Hypoxemia: Secondary | ICD-10-CM | POA: Diagnosis not present

## 2021-05-28 DIAGNOSIS — I6521 Occlusion and stenosis of right carotid artery: Secondary | ICD-10-CM | POA: Diagnosis not present

## 2021-05-28 DIAGNOSIS — I4510 Unspecified right bundle-branch block: Secondary | ICD-10-CM | POA: Diagnosis not present

## 2021-05-28 DIAGNOSIS — R55 Syncope and collapse: Secondary | ICD-10-CM | POA: Diagnosis not present

## 2021-05-28 DIAGNOSIS — I119 Hypertensive heart disease without heart failure: Secondary | ICD-10-CM | POA: Diagnosis not present

## 2021-05-28 DIAGNOSIS — E119 Type 2 diabetes mellitus without complications: Secondary | ICD-10-CM | POA: Diagnosis not present

## 2021-05-28 DIAGNOSIS — E785 Hyperlipidemia, unspecified: Secondary | ICD-10-CM | POA: Diagnosis not present

## 2021-05-28 DIAGNOSIS — S36299A Other injury of unspecified part of pancreas, initial encounter: Secondary | ICD-10-CM | POA: Diagnosis not present

## 2021-05-28 DIAGNOSIS — I5030 Unspecified diastolic (congestive) heart failure: Secondary | ICD-10-CM | POA: Diagnosis not present

## 2021-05-28 DIAGNOSIS — E539 Vitamin B deficiency, unspecified: Secondary | ICD-10-CM | POA: Diagnosis not present

## 2021-05-28 DIAGNOSIS — E039 Hypothyroidism, unspecified: Secondary | ICD-10-CM | POA: Diagnosis not present

## 2021-05-28 DIAGNOSIS — Z794 Long term (current) use of insulin: Secondary | ICD-10-CM | POA: Diagnosis not present

## 2021-05-29 DIAGNOSIS — I6521 Occlusion and stenosis of right carotid artery: Secondary | ICD-10-CM | POA: Diagnosis not present

## 2021-05-29 DIAGNOSIS — E039 Hypothyroidism, unspecified: Secondary | ICD-10-CM | POA: Diagnosis not present

## 2021-05-29 DIAGNOSIS — E785 Hyperlipidemia, unspecified: Secondary | ICD-10-CM | POA: Diagnosis not present

## 2021-05-29 DIAGNOSIS — E539 Vitamin B deficiency, unspecified: Secondary | ICD-10-CM | POA: Diagnosis not present

## 2021-05-29 DIAGNOSIS — I5030 Unspecified diastolic (congestive) heart failure: Secondary | ICD-10-CM | POA: Diagnosis not present

## 2021-05-29 DIAGNOSIS — Z794 Long term (current) use of insulin: Secondary | ICD-10-CM | POA: Diagnosis not present

## 2021-05-29 DIAGNOSIS — E119 Type 2 diabetes mellitus without complications: Secondary | ICD-10-CM | POA: Diagnosis not present

## 2021-05-29 DIAGNOSIS — R55 Syncope and collapse: Secondary | ICD-10-CM | POA: Diagnosis not present

## 2021-05-29 DIAGNOSIS — S36299A Other injury of unspecified part of pancreas, initial encounter: Secondary | ICD-10-CM | POA: Diagnosis not present

## 2021-05-29 DIAGNOSIS — R001 Bradycardia, unspecified: Secondary | ICD-10-CM | POA: Diagnosis not present

## 2021-05-29 DIAGNOSIS — I119 Hypertensive heart disease without heart failure: Secondary | ICD-10-CM | POA: Diagnosis not present

## 2021-05-31 ENCOUNTER — Encounter: Payer: Self-pay | Admitting: Internal Medicine

## 2021-06-08 ENCOUNTER — Encounter: Payer: Self-pay | Admitting: Internal Medicine

## 2021-06-08 ENCOUNTER — Ambulatory Visit (INDEPENDENT_AMBULATORY_CARE_PROVIDER_SITE_OTHER): Payer: Medicare Other | Admitting: Internal Medicine

## 2021-06-08 ENCOUNTER — Other Ambulatory Visit: Payer: Self-pay

## 2021-06-08 VITALS — BP 140/68 | HR 94 | Ht 63.05 in | Wt 210.6 lb

## 2021-06-08 DIAGNOSIS — I5032 Chronic diastolic (congestive) heart failure: Secondary | ICD-10-CM

## 2021-06-08 DIAGNOSIS — Z789 Other specified health status: Secondary | ICD-10-CM | POA: Diagnosis not present

## 2021-06-08 DIAGNOSIS — R55 Syncope and collapse: Secondary | ICD-10-CM

## 2021-06-08 DIAGNOSIS — I2510 Atherosclerotic heart disease of native coronary artery without angina pectoris: Secondary | ICD-10-CM

## 2021-06-08 MED ORDER — GABAPENTIN 100 MG PO CAPS
ORAL_CAPSULE | ORAL | 5 refills | Status: DC
Start: 2021-06-08 — End: 2022-07-03

## 2021-06-08 NOTE — Patient Instructions (Signed)
Medication Instructions:  Dr. Debara Pickett prescribed gabapentin 100mg  capsules  -- take 3 capsules at bedtime -- can take 1-3 capsules during the day as needed  *If you need a refill on your cardiac medications before your next appointment, please call your pharmacy*  Follow-Up: At [facility], you and your health needs are our priority.  As part of our continuing mission to provide you with exceptional heart care, we have created designated Provider Care Teams.  These Care Teams include your primary Cardiologist (physician) and Advanced Practice Providers (APPs -  Physician Assistants and Nurse Practitioners) who all work together to provide you with the care you need, when you need it.  We recommend signing up for the patient portal called "MyChart".  Sign up information is provided on this After Visit Summary.  MyChart is used to connect with patients for Virtual Visits (Telemedicine).  Patients are able to view lab/test results, encounter notes, upcoming appointments, etc.  Non-urgent messages can be sent to your provider as well.   To learn more about what you can do with MyChart, go to [email or web address].    Your next appointment:   August 10, 2021

## 2021-06-08 NOTE — Progress Notes (Signed)
Cardiology Office Note   Date:  06/08/2021   ID:  Whitney, Newton 1941-03-03, MRN [id number]  PCP:  Alroy Dust, Carlean Jews.Marlou Sa, MD   CC: Follow-up hospitalization  History of Present Illness: Whitney Newton is a 80 y.o. female who presents for  Four-month follow-up visit  This pleasant 80 year old woman is seen for a scheduled followup visit. The patient has a history of having had a stroke in early December 2014. She was at her diabetes clinic at [facility] and they diagnosed her and sent her straight to neurology where she was hospitalized for 3 days. The MRI showed a new stroke on the right side. She had carotid Dopplers which did not show any obstructive lesions but she did have some plaque and they put her back on aspirin and continued her Plavix. The patient had a echocardiogram which was a bubble study and did not show any evidence of right-to-left shunt.  More recently, she has had further neurology workup at [facility] on 06/20/14.  She had a transcranial Doppler.  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  Previous carotid Dopplers had not shown any significant external cranial disease  She has a history of essential hypertension, diabetes mellitus, and dyslipidemia. She has had atypical chest pain. She is intolerant of statin drugs. We updated her lexiscan Myoview stress test on 03/17/12 and it was normal showing no evidence of ischemia and her ejection fraction was 78%. The patient has never had a cardiac catheterization. She has continued to have some shortness of breath. She had an echocardiogram in 04/07/12 which showed an ejection fraction of 55-65% with grade 1 diastolic dysfunction. There is trivial aortic insufficiency.   The patient has a history of essential hypertension as well as atypical chest pain. Her diabetes is now being  followed at the [facility] clinic at [facility].  Since we last saw her she had an ophthalmology evaluation on 07/27/14 by Dr. Kathrin Penner and no diabetic retinopathy was found.  Her last chest x-ray was on 12/21/12 elsewhere and showed a normal heart size and low lung volumes. She had a recent CT scan of the chest on 03/23/13 at [facility] which showed normal heart size and demonstrated atherosclerosis of the aorta.   The patient has a past history of dermatofibrosarcoma of the spine. She had surgery for this at [facility]. She also has a past history of angiolipoma of the abdominal wall.   Since last visit she has not been experiencing any new cardiac symptoms.  She does have sleep apnea.  She uses a CPAP machine.  She has had some low back pain and pain in her hip radiating to her knees.  She has seen Dr. Durward Fortes. She was admitted to the hospital on 01/18/15 for chest and abdominal pain.  She had a CTA of the chest on 01/18/15 which was negative for pulmonary embolus.  The following day she had a Myoview on 01/19/15 which showed no ischemia and her ejection fraction was 85%.  She had a lot of side effects from the [facility].  It was felt in retrospect that her presenting complaints were probably GI in origin rather than cardiac.  She does have a history of GERD and sees Dr. Oletta Lamas.  since last visit she has had no new cardiac symptoms.  She has not been having any recent chest  discomfort. Her weight is unchanged and her blood pressures been stable.  11/22/2015  Whitney Newton presents today to establish care with me. She is a previous patient of Dr. Warren Danes. She [place] was at the beach and developed some worsening shortness of breath and leg swelling. Her nephew was there who came down from [place] with a head cold and she seems to have symptoms now at this time. She was also seen at an urgent care there and placed on amoxicillin. She's taken that for a few days. She notes  significant swelling in her weight is much higher than it had been in the past. She endorses a high salt intake. Her last echocardiogram was in 2013 which showed an EF of 55-65% with grade 1 diastolic dysfunction. She had a stress test in 2016 for chest pain which showed an EF of 85%.  12/25/2015  I saw Whitney Newton that today in the office. She has been taking the Lasix 40 mg daily which was over a week and this resulted in significant diuresis. Her weight had reduced from 202 down to 189. She then had to stop it for a few days because of some other medical problems are currently weight is up to 194. Her BNP associate with this was very low at 8 and renal function appeared to be stable. She reports a 75% improvement in breathing. Her repeat echo shows that EF remains stable with grade 1 diastolic dysfunction.  03/13/2016  Whitney Newton reports she's had a marked improvement in her swelling and breathing with additional diuretics. Weight has been fairly stable. She reports one episode of chest pain which was responsive to nitroglycerin. She's also had some reflux symptoms and is currently on Zegerid which appears to be helping her symptoms. Is not clear whether these episodes of chest discomfort are related to reflux or coronary ischemia. She did have a negative Myoview stress test in August 2016.  08/29/2016  Whitney Newton was seen today in the office. Over the past several day she's had worsening shortness of breath and lower extremity swelling. Her weight is now up about 6 pounds from her previous office visit. She said over the auscultation is been taking Lasix more regularly. She's currently taking 40 mg daily but as previously taken that for 3 days and alternating with 20 mg. She notes some improvement today after a couple days of diuretics.  10/08/2016  Whitney Newton returns today for follow-up. She has diuresed several pounds with increased dose diuretics are per creatinine rose and therefore I  decreased her diuretic back to 40 mg daily. She says she feels like she is over dried out on that dose of diuretic. She occasionally gets some cramping. She's also had some labile blood sugars which she attributes to the diuretics. In addition she had a recent bowel obstruction which may been related to diuresis.  03/06/2017  Whitney Newton was seen today in follow-up. She recently got back from a trip to assess catch one with her husband. She said they were traveling for about 16 days and she did not take her Lasix during that period of time. Her weight is up about 6 pounds that she reports lower extremity swelling. She has a sliding scale Lasix to take but has not been compliant with that.  08/20/2017  Whitney Newton returns today for follow-up.  She has been having a lot of symptoms recently.  She is complaining of pain across her abdomen.  This is been further assessed and there is  a suggestion it could be related to back surgery.  She was also found to have mass on the liver and pancreas, thought to be hemangioma.  She is scheduled to have a CT scan of her abdomen next week.  She continues to gain weight.  Her weight is up 6 pounds since I saw her in September.  Which was 203 at the time.  Her weight had been as low as 195 last year.  She does report lower extremity edema and some worsening shortness of breath however she relates the shortness of breath more to her pain.  She had been on higher dose Lasix in the past but was noted to have some elevation in creatinine.  We did reduce the dose and she has been compliant taking 20 mg daily.  02/05/2018  Whitney Newton returns for follow-up today.  Again she has numerous complaints however she is recently been having worsening chest discomfort.  She describes it as a squeezing or pressure in the center of her chest sometimes radiates up her neck.  May be associated with worsening shortness of breath.  Despite this she is taken extra Lasix and her weight is come down to her baseline of  203.  She has not had symptomatic improvement.  She was scheduled to see me in a few weeks but got an earlier appointment because of her chest discomfort.  Sometimes her symptoms are associated with exertion and relieved by rest at other times can be present at rest or when awakening.  02/23/2018  Whitney Newton was seen today in follow-up of heart cath.  She underwent left heart catheterization on 02/11/2018.  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  LVEF was 65% with normal LVEDP 18 mmHg.  Post cath she still reports shortness of breath with certain activities.  She did have recent lipid testing which was as follows: LDL 148, Total chol 225, HDL 54, Non-HDL 171, TG 178.  Her goal LDL is less than 70 and she has not yet reach that target.  Unfortunately she has been statin intolerant and is a good candidate for PCSK9 inhibitor.  05/22/2018  Whitney Newton is seen today in follow-up.  She was started on Repatha which she seems to be tolerating well.  She has been on the medicine now for about 3 months and is getting it from free with support from [place].  Recently she had transcranial Dopplers at [facility] which actually shows some mild improvement in her intracranial atherosclerosis.  This could be related to her aggressive lipid therapy.  She was supposed to have repeat labs prior to this visit however it was not performed.  We will need to order a repeat lipid profile in order to reassess her therapy so far.  08/10/2018  Whitney Newton returns today for follow-up.  She has been doing well with Repatha although has had several injections which have possibly failed.  She seems to be injecting on the anterior thigh and has had some bleeding and bruising.  She also has trouble depressing the injector and noted some leakage of the  medication.  I did perform some extra training with her today and helped describe to her a better way to hold the pen as well as a better injection site which I think should improve her drug delivery.  That being said in  December, her total cholesterol come down significantly to 104, with HDL 62, LDL 26 and triglycerides of 81.  Hemoglobin A1c remains persistently elevated at 8.2.  04/16/2019  Whitney Newton is seen today in follow-up.  She thinks she might of had another stroke in May.  She had some word finding difficulties.  She is being followed by neurology at [facility] and had a recent transcranial Doppler and may have an MRI because she is been having some numbness and tingling in her arms.  She seems to be tolerating Repatha.  Her labs are markedly improved.  Total cholesterol now 104, triglycerides 81, HDL 62 and LDL 26.  No significant side effects with it.  10/22/2019  Whitney Newton returns today for follow-up with her husband.  She denies any new stroke symptoms.  She is responded well to Repatha and although her LDL was down as low as 14 it is now come up to 49 about 6 months ago.  Again this is well controlled however the increase is due to dietary changes.  She says she is eating more pizza, bacon and eggs and other saturated fats.  In addition she has gained weight.  There is also lower extremity edema.  She reports lack of compliance with the Lasix.  She says she may go several days without taking any and then has to take several days worth.  She has had issues with incontinence and a spastic bladder.  04/19/2020  Whitney Newton returns today with her husband.  Overall she reports some worsening shortness of breath.  She has had lower extremity edema and weight gain despite taking her Lasix more regularly.  She takes 40 mg daily.  Weight is up again another few pounds.  She is not had a repeat echo since 2017 which showed normal LVEF at the time but diastolic dysfunction.  01/24/2021  Whitney Newton returns today in follow-up.   She has a number of different complaints today.  She is really struggling with fatigue and some daytime somnolence.  She says she has trouble sleeping at night.  I think she is struggling with neuropathy.  She has lower extremity edema but recently we had increased her diuretics.  Her BNP was low and I recommended instead of 40 twice daily to go to 60 mg Lasix daily.  Potassium and electrolytes are normal however she has had leg cramps and restlessness mostly at night.  06/08/2021  Whitney Newton is seen today in follow-up.  She was just hospitalized at [facility] for syncopal episode.  This occurred in her home.  She apparently became somewhat unresponsive and her husband called 911.  She then syncopized and was awakened by EMS.  She was taken to [facility].  Initial work-up was unremarkable.  She apparently had an elevated D-dimer but testing was negative for PE.  Repeat echo showed normal LVEF 60 to 65%.  She was noted to be dehydrated and taken off of her Ziac.  She had previously discontinued Lasix which I placed her on before.  She says that her swelling is actually been a little better and she is urinating less.  They increased her losartan from 25 to 50 mg daily and noted she was B12 deficient and had given her some B12 injections.  She says her energy level has improved somewhat.  EKG here shows normal sinus rhythm at 94.  She was placed on a monitor which she recently returned but those results were not available and would be interpreted by the staff at [facility].  Past Medical  History:  Diagnosis Date   Acute diastolic (congestive) heart failure ([place]) 12/25/2015   Back pain    prior back surgery in 2009 - slow to recover   Cancer ([place])    Diabetes ([place])    Diverticulitis    Gastritis    Hemangioma of liver    managed conservatively; followed at [place] (hyperlipidemia)    statin intolerant   Hypertension    Normal cardiac stress test 2011   Obesity    Thyroid disease    hypothyroidism     Past Surgical History:  Procedure Laterality Date   ABDOMINAL HYSTERECTOMY     APPENDECTOMY     LEFT HEART CATH AND CORONARY ANGIOGRAPHY N/A 02/11/2018   Procedure: LEFT HEART CATH AND CORONARY ANGIOGRAPHY;  Surgeon: Troy Sine, MD;  Location: [place] [facility];  Service: Cardiovascular;  Laterality: N/A;   NASAL RECONSTRUCTION       Current Outpatient Medications  Medication Sig Dispense Refill   aspirin 81 MG tablet Take 81 mg by mouth daily.     clopidogrel (PLAVIX) 75 MG tablet Take 1 tablet (75 mg total) by mouth daily. 90 tablet 3   colesevelam (WELCHOL) 625 MG tablet Take 3 tablets (1,875 mg total) by mouth 2 (two) times daily as needed (cholesterol). 180 tablet 6   cyanocobalamin (,VITAMIN B-12,) 1000 MCG/ML injection Inject into the muscle.     diclofenac sodium (VOLTAREN) 1 % GEL Apply 1 application topically at bedtime as needed (for pain).     Evolocumab (REPATHA) 140 MG/ML SOSY INJECT 1 pen  SUBCUTANEOUSLY EVERY TWO WEEKS 2 mL 11   furosemide (LASIX) 40 MG tablet Take 1.5 tablets (60 mg total) by mouth daily. 135 tablet 3   gabapentin (NEURONTIN) 100 MG capsule Take one as needed for breakthrough pain     gabapentin (NEURONTIN) 300 MG capsule Take 1 capsule (300 mg total) by mouth at bedtime. 90 capsule 3   Glucagon 0.5 MG/0.1ML SOAJ glucagon (human recombinant) 1 mg solution for injection  Take by injection route.     HUMALOG KWIKPEN 100 UNIT/ML KiwkPen Inject 14 Units into the skin See admin instructions. Inject 11 units SQ at breakfast, inject 14 units SQ at lunch and inject 16 units SQ at supper. Plus sliding scale 1 unit for every 25 BS is > 120     levothyroxine (SYNTHROID, LEVOTHROID) 88 MCG tablet Take 88 mcg by mouth daily.      Liniments (SALONPAS EX) Apply 1 patch topically daily as needed (for pain).     losartan (COZAAR) 25 MG tablet TAKE 1 TABLET ONCE DAILY. 90 tablet 1   meclizine (ANTIVERT) 12.5 MG tablet Take 1 tablet (12.5 mg total) by mouth 3  (three) times daily as needed for dizziness. 30 tablet 0   nitroGLYCERIN (NITROSTAT) 0.4 MG SL tablet Place 1 tablet (0.4 mg total) under the tongue every 5 (five) minutes as needed for chest pain. 25 tablet 2   ondansetron (ZOFRAN ODT) 4 MG disintegrating tablet Take 1 tablet (4 mg total) by mouth every 8 (eight) hours as needed for nausea or vomiting. 20 tablet 0   TOUJEO SOLOSTAR 300 UNIT/ML SOPN Inject 22 Units into the skin.      bisoprolol-hydrochlorothiazide (ZIAC) 10-6.25 MG tablet Take 1 tablet by mouth daily. (Patient not taking: Reported on 06/08/2021) 90 tablet 3   hyoscyamine (LEVBID) 0.375 MG 12 hr tablet Take 0.375 mg by mouth every 12 (twelve) hours as needed (cramps).  (Patient not  taking: Reported on 06/08/2021)     potassium chloride SA (KLOR-CON) 20 MEQ tablet Take 2 tablets (40 mEq total) by mouth daily. (Patient not taking: Reported on 06/08/2021) 180 tablet 1   No current facility-administered medications for this visit.    Allergies:   Celecoxib, Lipitor [atorvastatin calcium], Zetia [ezetimibe], Ropinirole hcl, Actos [pioglitazone hydrochloride], Crestor [rosuvastatin calcium], Doxycycline, Duloxetine, Fenofibrate, Fenofibrate, Imdur [isosorbide nitrate], Isosorbide, Metformin and related, Niaspan [niacin], Pravachol, Pravachol [pravastatin], Ranolazine er, Ropinirole, Rosuvastatin, Niaspan [niacin er], and Ranolazine    Social History:  The patient  reports that she has never smoked. She has never used smokeless tobacco. She reports that she does not drink alcohol and does not use drugs.   Family History:  The patient's family history includes Dementia in her father; Heart attack in her brother and father; Heart disease in her brother and father; Hypertension in her mother; Stroke in her mother.    ROS:   Pertinent items noted in HPI and remainder of comprehensive ROS otherwise negative.  PHYSICAL EXAM: VS:  BP 140/68    Pulse 94    Ht 5' 3.05" (1.601 m)    Wt 210 lb  9.6 oz (95.5 kg)    SpO2 99%    BMI 37.25 kg/m  , BMI Body mass index is 37.25 kg/m. General appearance: alert, no distress and moderately obese Neck: no carotid bruit and no JVD Lungs: diminished breath sounds bilaterally Heart: regular rate and rhythm Abdomen: soft, non-tender; bowel sounds normal; no masses,  no organomegaly Extremities: edema 1+ pedal edema Pulses: 2+ and symmetric Skin: Skin color, texture, turgor normal. No rashes or lesions Neurologic: Grossly normal Psych: Mildly anxious  EKG:   Normal sinus rhythm at 94, incomplete right bundle branch block-personally reviewed  Recent Labs: 08/28/2020: ALT 17; BUN 23; Creatinine, Ser 0.80; Hemoglobin 13.6; Platelets 236; Potassium 3.9; Sodium 142    Lipid Panel    Component Value Date/Time   CHOL 119 04/19/2020 1123   TRIG 98 04/19/2020 1123   HDL 70 04/19/2020 1123   CHOLHDL 1.7 04/19/2020 1123   CHOLHDL 3.8 11/15/2015 0954   VLDL 20 11/15/2015 0954   LDLCALC 31 04/19/2020 1123   LDLDIRECT 148.7 09/14/2012 0857      Wt Readings from Last 3 Encounters:  06/08/21 210 lb 9.6 oz (95.5 kg)  01/23/21 211 lb 3.2 oz (95.8 kg)  01/04/21 212 lb (96.2 kg)     ASSESSMENT AND PLAN:  Syncope Chronic diastolic congestive heart failure Hypertensive heart disease without heart failure Diabetes mellitus - on insulin Atypical chest pain with normal Myoview stress test in August 2016.  No ischemia.  Ejection fraction 85%. old CVA followed at [facility] History of dermatofibro- sarcoma of the spine. Dyslipidemia Osteoarthritis OSA on CPAP [facility]) Hypothyroidism followed at [facility]. GERD, followed by Dr. Oletta Lamas  RBBB Peripheral neuropathy   PLAN:  Whitney Newton had a recent syncopal episode which may have been related to dehydration.  She was taken off of Ziac.  She had already discontinued her diuretic.  She does have some degree of chronic diastolic heart failure.  She says her weights been fairly  stable between 209 and 211 pounds.  She has chronic lower extremity swelling.  Most of this is probably related to peripheral neuropathy.  She has had some improvement with gabapentin for pain although says to take breakthrough during the day.  She would prefer to have 100 mg capsule so she could take some additional medicine during the day  if she has breakthrough.  I advised her to take 300 mg at night routinely.  We will follow-up with [facility] regarding her monitor.  Echo showed stable LVEF.  Follow-up with me as scheduled in March  Pixie Casino, MD, [facility], [place] Director of the Advanced Lipid Disorders &  Cardiovascular Risk Reduction Clinic Diplomate of the American Board of Clinical Lipidology Attending Cardiologist  Direct Dial: [phone]   Fax: [phone]  Website:  [email or web address]   06/08/2021 2:51 PM

## 2021-06-14 DIAGNOSIS — R55 Syncope and collapse: Secondary | ICD-10-CM | POA: Diagnosis not present

## 2021-06-15 DIAGNOSIS — I471 Supraventricular tachycardia: Secondary | ICD-10-CM | POA: Diagnosis not present

## 2021-06-18 ENCOUNTER — Encounter: Payer: Self-pay | Admitting: Internal Medicine

## 2021-06-20 NOTE — Telephone Encounter (Signed)
Will review strips next week - however, doubt these episodes would cause syncope - she would need to have sustained SVT at much faster rates. May be able to adjust her meds some.  Dr Lemmie Evens

## 2021-06-20 NOTE — Telephone Encounter (Signed)
Patient dropped of printed copy of zio monitor SVT noted - range 90-200bpm - 6 runs - fastest run 200bpm, lasting 4 beats - longest run lasting 48.8 secs w/average HR 123bpm ST noted - range 56-140bpm, avg 90bpm  Will have MD review report upon return to office  Message sent to patient acknowledging receipt of the monitor report

## 2021-06-25 NOTE — Telephone Encounter (Signed)
She was previously on bisoprolol/HCTZ - this has been stopped. Probably why she is having some breakthrough SVT. Ok to start Toprol XL 25 mg daily.  Dr. Lemmie Evens

## 2021-06-27 DIAGNOSIS — M79641 Pain in right hand: Secondary | ICD-10-CM | POA: Diagnosis not present

## 2021-06-27 DIAGNOSIS — G5603 Carpal tunnel syndrome, bilateral upper limbs: Secondary | ICD-10-CM | POA: Diagnosis not present

## 2021-06-27 DIAGNOSIS — M79642 Pain in left hand: Secondary | ICD-10-CM | POA: Diagnosis not present

## 2021-06-27 DIAGNOSIS — M13849 Other specified arthritis, unspecified hand: Secondary | ICD-10-CM | POA: Diagnosis not present

## 2021-06-27 DIAGNOSIS — E119 Type 2 diabetes mellitus without complications: Secondary | ICD-10-CM | POA: Diagnosis not present

## 2021-06-27 NOTE — Telephone Encounter (Signed)
I would try the toprol as prescribed - thanks

## 2021-06-28 MED ORDER — METOPROLOL SUCCINATE ER 25 MG PO TB24: 25.0000 mg | ORAL_TABLET | Freq: Every day | ORAL | 3 refills | Status: DC

## 2021-07-04 DIAGNOSIS — E1165 Type 2 diabetes mellitus with hyperglycemia: Secondary | ICD-10-CM | POA: Diagnosis not present

## 2021-07-04 DIAGNOSIS — Z79890 Hormone replacement therapy (postmenopausal): Secondary | ICD-10-CM | POA: Diagnosis not present

## 2021-07-04 DIAGNOSIS — Z79899 Other long term (current) drug therapy: Secondary | ICD-10-CM | POA: Diagnosis not present

## 2021-07-04 DIAGNOSIS — Z794 Long term (current) use of insulin: Secondary | ICD-10-CM | POA: Diagnosis not present

## 2021-07-04 DIAGNOSIS — E1140 Type 2 diabetes mellitus with diabetic neuropathy, unspecified: Secondary | ICD-10-CM | POA: Diagnosis not present

## 2021-07-04 DIAGNOSIS — I10 Essential (primary) hypertension: Secondary | ICD-10-CM | POA: Diagnosis not present

## 2021-07-04 DIAGNOSIS — E11649 Type 2 diabetes mellitus with hypoglycemia without coma: Secondary | ICD-10-CM | POA: Diagnosis not present

## 2021-07-04 DIAGNOSIS — E11319 Type 2 diabetes mellitus with unspecified diabetic retinopathy without macular edema: Secondary | ICD-10-CM | POA: Diagnosis not present

## 2021-07-04 DIAGNOSIS — M79643 Pain in unspecified hand: Secondary | ICD-10-CM | POA: Diagnosis not present

## 2021-07-04 DIAGNOSIS — E039 Hypothyroidism, unspecified: Secondary | ICD-10-CM | POA: Diagnosis not present

## 2021-07-04 DIAGNOSIS — R600 Localized edema: Secondary | ICD-10-CM | POA: Diagnosis not present

## 2021-07-04 DIAGNOSIS — Z8719 Personal history of other diseases of the digestive system: Secondary | ICD-10-CM | POA: Diagnosis not present

## 2021-07-09 DIAGNOSIS — H31092 Other chorioretinal scars, left eye: Secondary | ICD-10-CM | POA: Diagnosis not present

## 2021-07-09 DIAGNOSIS — H35371 Puckering of macula, right eye: Secondary | ICD-10-CM | POA: Diagnosis not present

## 2021-07-09 DIAGNOSIS — E113392 Type 2 diabetes mellitus with moderate nonproliferative diabetic retinopathy without macular edema, left eye: Secondary | ICD-10-CM | POA: Diagnosis not present

## 2021-07-09 DIAGNOSIS — H3582 Retinal ischemia: Secondary | ICD-10-CM | POA: Diagnosis not present

## 2021-07-09 DIAGNOSIS — E113311 Type 2 diabetes mellitus with moderate nonproliferative diabetic retinopathy with macular edema, right eye: Secondary | ICD-10-CM | POA: Diagnosis not present

## 2021-08-02 DIAGNOSIS — Z20822 Contact with and (suspected) exposure to covid-19: Secondary | ICD-10-CM | POA: Diagnosis not present

## 2021-08-10 ENCOUNTER — Encounter: Payer: Self-pay | Admitting: Internal Medicine

## 2021-08-10 ENCOUNTER — Other Ambulatory Visit: Payer: Self-pay

## 2021-08-10 ENCOUNTER — Ambulatory Visit (INDEPENDENT_AMBULATORY_CARE_PROVIDER_SITE_OTHER): Payer: Medicare Other | Admitting: Internal Medicine

## 2021-08-10 VITALS — BP 170/82 | HR 77 | Ht 63.0 in | Wt 215.6 lb

## 2021-08-10 DIAGNOSIS — I10 Essential (primary) hypertension: Secondary | ICD-10-CM

## 2021-08-10 DIAGNOSIS — E785 Hyperlipidemia, unspecified: Secondary | ICD-10-CM | POA: Diagnosis not present

## 2021-08-10 DIAGNOSIS — R600 Localized edema: Secondary | ICD-10-CM

## 2021-08-10 DIAGNOSIS — Z789 Other specified health status: Secondary | ICD-10-CM

## 2021-08-10 MED ORDER — BISOPROLOL-HYDROCHLOROTHIAZIDE 10-6.25 MG PO TABS: 1.0000 | ORAL_TABLET | Freq: Every day | ORAL | 3 refills | Status: DC

## 2021-08-10 NOTE — Progress Notes (Signed)
Cardiology Office Note   Date:  08/10/2021   ID:  WINNA GOLLA, DOB April 18, 1941, MRN [id number]  PCP:  Aurea Graff.Marlou Sa, MD   CC: Follow-up monitor, edema  History of Present Illness: NIRALI MAGOUIRK is a 81 y.o. female who presents for  Four-month follow-up visit  This pleasant 81 year old woman is seen for a scheduled followup visit. The patient has a history of having had a stroke in early December 2014. She was at her diabetes clinic at [facility] and they diagnosed her and sent her straight to neurology where she was hospitalized for 3 days. The MRI showed a new stroke on the right side. She had carotid Dopplers which did not show any obstructive lesions but she did have some plaque and they put her back on aspirin and continued her Plavix. The patient had a echocardiogram which was a bubble study and did not show any evidence of right-to-left shunt.  More recently, she has had further neurology workup at [facility] on 06/20/14.  She had a transcranial Doppler.  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  Previous carotid Dopplers had not shown any significant external cranial disease  She has a history of essential hypertension, diabetes mellitus, and dyslipidemia. She has had atypical chest pain. She is intolerant of statin drugs. We updated her lexiscan Myoview stress test on 03/17/12 and it was normal showing no evidence of ischemia and her ejection fraction was 78%. The patient has never had a cardiac catheterization. She has continued to have some shortness of breath. She had an echocardiogram in 04/07/12 which showed an ejection fraction of 55-65% with grade 1 diastolic dysfunction. There is trivial aortic insufficiency.   The patient has a history of essential hypertension as well as atypical chest pain. Her diabetes is now being followed  at the [facility] at [facility].  Since we last saw her she had an ophthalmology evaluation on 07/27/14 by Dr. Kathrin Penner and no diabetic retinopathy was found.  Her last chest x-ray was on 12/21/12 elsewhere and showed a normal heart size and low lung volumes. She had a recent CT scan of the chest on 03/23/13 at [facility] which showed normal heart size and demonstrated atherosclerosis of the aorta.   The patient has a past history of dermatofibrosarcoma of the spine. She had surgery for this at [facility]. She also has a past history of angiolipoma of the abdominal wall.   Since last visit she has not been experiencing any new cardiac symptoms.  She does have sleep apnea.  She uses a CPAP machine.  She has had some low back pain and pain in her hip radiating to her knees.  She has seen Dr. Durward Fortes. She was admitted to the hospital on 01/18/15 for chest and abdominal pain.  She had a CTA of the chest on 01/18/15 which was negative for pulmonary embolus.  The following day she had a Myoview on 01/19/15 which showed no ischemia and her ejection fraction was 85%.  She had a lot of side effects from the [facility].  It was felt in retrospect that her presenting complaints were probably GI in origin rather than cardiac.  She does have a history of GERD and sees Dr. Oletta Lamas.  since last visit she has had no new cardiac symptoms.  She has not been having any recent  chest discomfort. Her weight is unchanged and her blood pressures been stable.  11/22/2015  Mrs. Lottman presents today to establish care with me. She is a previous patient of Dr. Warren Danes. She recently was at the beach and developed some worsening shortness of breath and leg swelling. Her nephew was there who came down from [place] with a head cold and she seems to have symptoms now at this time. She was also seen at an urgent care there and placed on amoxicillin. She's taken that for a few days. She notes significant  swelling in her weight is much higher than it had been in the past. She endorses a high salt intake. Her last echocardiogram was in 2013 which showed an EF of 55-65% with grade 1 diastolic dysfunction. She had a stress test in 2016 for chest pain which showed an EF of 85%.  12/25/2015  I saw [name] that today in the office. She has been taking the Lasix 40 mg daily which was over a week and this resulted in significant diuresis. Her weight had reduced from 202 down to 189. She then had to stop it for a few days because of some other medical problems are currently weight is up to 194. Her BNP associate with this was very low at 8 and renal function appeared to be stable. She reports a 75% improvement in breathing. Her repeat echo shows that EF remains stable with grade 1 diastolic dysfunction.  03/13/2016  [name] reports she's had a marked improvement in her swelling and breathing with additional diuretics. Weight has been fairly stable. She reports one episode of chest pain which was responsive to nitroglycerin. She's also had some reflux symptoms and is currently on Zegerid which appears to be helping her symptoms. Is not clear whether these episodes of chest discomfort are related to reflux or coronary ischemia. She did have a negative Myoview stress test in August 2016.  08/29/2016  Mrs. Pecor was seen today in the office. Over the past several day she's had worsening shortness of breath and lower extremity swelling. Her weight is now up about 6 pounds from her previous office visit. She said over the auscultation is been taking Lasix more regularly. She's currently taking 40 mg daily but as previously taken that for 3 days and alternating with 20 mg. She notes some improvement today after a couple days of diuretics.  10/08/2016  [name] returns today for follow-up. She has diuresed several pounds with increased dose diuretics are per creatinine rose and therefore I decreased her  diuretic back to 40 mg daily. She says she feels like she is over dried out on that dose of diuretic. She occasionally gets some cramping. She's also had some labile blood sugars which she attributes to the diuretics. In addition she had a recent bowel obstruction which may been related to diuresis.  03/06/2017  Emanuelle was seen today in follow-up. She recently got back from a trip to assess catch one with her husband. She said they were traveling for about 16 days and she did not take her Lasix during that period of time. Her weight is up about 6 pounds that she reports lower extremity swelling. She has a sliding scale Lasix to take but has not been compliant with that.  08/20/2017  Zaylynn returns today for follow-up.  She has been having a lot of symptoms recently.  She is complaining of pain across her abdomen.  This is been further assessed and there  is a suggestion it could be related to back surgery.  She was also found to have mass on the liver and pancreas, thought to be hemangioma.  She is scheduled to have a CT scan of her abdomen next week.  She continues to gain weight.  Her weight is up 6 pounds since I saw her in September.  Which was 203 at the time.  Her weight had been as low as 195 last year.  She does report lower extremity edema and some worsening shortness of breath however she relates the shortness of breath more to her pain.  She had been on higher dose Lasix in the past but was noted to have some elevation in creatinine.  We did reduce the dose and she has been compliant taking 20 mg daily.  02/05/2018  Genifer returns for follow-up today.  Again she has numerous complaints however she is recently been having worsening chest discomfort.  She describes it as a squeezing or pressure in the center of her chest sometimes radiates up her neck.  May be associated with worsening shortness of breath.  Despite this she is taken extra Lasix and her weight is come down to her baseline of 203.  She has  not had symptomatic improvement.  She was scheduled to see me in a few weeks but got an earlier appointment because of her chest discomfort.  Sometimes her symptoms are associated with exertion and relieved by rest at other times can be present at rest or when awakening.  02/23/2018  Elzina was seen today in follow-up of heart cath.  She underwent left heart catheterization on 02/11/2018.  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  LVEF was 65% with normal LVEDP 18 mmHg.  Post cath she still reports shortness of breath with certain activities.  She did have recent lipid testing which was as follows: LDL 148, Total chol 225, HDL 54, Non-HDL 171, TG 178.  Her goal LDL is less than 70 and she has not yet reach that target.  Unfortunately she has been statin intolerant and is a good candidate for PCSK9 inhibitor.  05/22/2018  Lotoya is seen today in follow-up.  She was started on Repatha which she seems to be tolerating well.  She has been on the medicine now for about 3 months and is getting it from free with support from [place].  Recently she had transcranial Dopplers at [facility] which actually shows some mild improvement in her intracranial atherosclerosis.  This could be related to her aggressive lipid therapy.  She was supposed to have repeat labs prior to this visit however it was not performed.  We will need to order a repeat lipid profile in order to reassess her therapy so far.  08/10/2018  Hailley returns today for follow-up.  She has been doing well with Repatha although has had several injections which have possibly failed.  She seems to be injecting on the anterior thigh and has had some bleeding and bruising.  She also has trouble depressing the injector and noted some leakage of the medication.   I did perform some extra training with her today and helped describe to her a better way to hold the pen as well as a better injection site which I think should improve her drug delivery.  That being said  in December, her total cholesterol come down significantly to 104, with HDL 62, LDL 26 and triglycerides of 81.  Hemoglobin A1c remains persistently elevated at 8.2.  04/16/2019  Maigan is seen today in follow-up.  She thinks she might of had another stroke in May.  She had some word finding difficulties.  She is being followed by neurology at [facility] and had a recent transcranial Doppler and may have an MRI because she is been having some numbness and tingling in her arms.  She seems to be tolerating Repatha.  Her labs are markedly improved.  Total cholesterol now 104, triglycerides 81, HDL 62 and LDL 26.  No significant side effects with it.  10/22/2019  Adriella returns today for follow-up with her husband.  She denies any new stroke symptoms.  She is responded well to Repatha and although her LDL was down as low as 14 it is now come up to 49 about 6 months ago.  Again this is well controlled however the increase is due to dietary changes.  She says she is eating more pizza, bacon and eggs and other saturated fats.  In addition she has gained weight.  There is also lower extremity edema.  She reports lack of compliance with the Lasix.  She says she may go several days without taking any and then has to take several days worth.  She has had issues with incontinence and a spastic bladder.  04/19/2020  Shamya returns today with her husband.  Overall she reports some worsening shortness of breath.  She has had lower extremity edema and weight gain despite taking her Lasix more regularly.  She takes 40 mg daily.  Weight is up again another few pounds.  She is not had a repeat echo since 2017 which showed normal LVEF at the time but diastolic dysfunction.  01/24/2021  Tyishia returns today in follow-up.  She has a  number of different complaints today.  She is really struggling with fatigue and some daytime somnolence.  She says she has trouble sleeping at night.  I think she is struggling with neuropathy.  She has lower extremity edema but recently we had increased her diuretics.  Her BNP was low and I recommended instead of 40 twice daily to go to 60 mg Lasix daily.  Potassium and electrolytes are normal however she has had leg cramps and restlessness mostly at night.  06/08/2021  Joane is seen today in follow-up.  She was just hospitalized at [facility] for syncopal episode.  This occurred in her home.  She apparently became somewhat unresponsive and her husband called 911.  She then syncopized and was awakened by EMS.  She was taken to [facility].  Initial work-up was unremarkable.  She apparently had an elevated D-dimer but testing was negative for PE.  Repeat echo showed normal LVEF 60 to 65%.  She was noted to be dehydrated and taken off of her Ziac.  She had previously discontinued Lasix which I placed her on before.  She says that her swelling is actually been a little better and she is urinating less.  They increased her losartan from 25 to 50 mg daily and noted she was B12 deficient and had given her some B12 injections.  She says her energy level has improved somewhat.  EKG here shows normal sinus rhythm at 94.  She was placed on a monitor which she recently returned but those results were not available and would be interpreted by the staff at [facility].  08/10/2021  Lorell returns today for follow-up of her monitor and worsening edema.  Blood pressure is also been poorly controlled recently.  She is been taken off of Ziac.  She was on metoprolol which I started but she said she could not tolerate it due to joint pains.  She also has had worsening lower extremity edema and weight gain up another 5 pounds since I saw her in December.  Overall she does not feel well.  Unfortunately she has numerous medication intolerances  and it has been difficult to find a regimen that she could tolerate.  Past Medical History:  Diagnosis Date   Acute diastolic (congestive) heart failure ([place]) 12/25/2015   Back pain    prior back surgery in 2009 - slow to recover   Cancer ([place])    Diabetes ([place])    Diverticulitis    Gastritis    Hemangioma of liver    managed conservatively; followed at [facility] (hyperlipidemia)    statin intolerant   Hypertension    Normal cardiac stress test 2011   Obesity    Thyroid disease    hypothyroidism    Past Surgical History:  Procedure Laterality Date   ABDOMINAL HYSTERECTOMY     APPENDECTOMY     LEFT HEART CATH AND CORONARY ANGIOGRAPHY N/A 02/11/2018   Procedure: LEFT HEART CATH AND CORONARY ANGIOGRAPHY;  Surgeon: Troy Sine, MD;  Location: [facility];  Service: Cardiovascular;  Laterality: N/A;   NASAL RECONSTRUCTION       Current Outpatient Medications  Medication Sig Dispense Refill   aspirin 81 MG tablet Take 81 mg by mouth daily.     bisoprolol-hydrochlorothiazide (ZIAC) 10-6.25 MG tablet Take 1 tablet by mouth daily. 90 tablet 3   clopidogrel (PLAVIX) 75 MG tablet Take 1 tablet (75 mg total) by mouth daily. 90 tablet 3   colesevelam (WELCHOL) 625 MG tablet Take 3 tablets (1,875 mg total) by mouth 2 (two) times daily as needed (cholesterol). 180 tablet 6   diclofenac sodium (VOLTAREN) 1 % GEL Apply 1 application topically at bedtime as needed (for pain).     Evolocumab (REPATHA) 140 MG/ML SOSY INJECT 1 pen  SUBCUTANEOUSLY EVERY TWO WEEKS 2 mL 11   furosemide (LASIX) 40 MG tablet Take 1.5 tablets (60 mg total) by mouth daily. 135 tablet 3   gabapentin (NEURONTIN) 100 MG capsule Take 3 capsules (300mg ) by mouth at night and take additional 1-3 capsules daily as needed for breakthrough. 180 capsule 5   Glucagon 0.5 MG/0.1ML SOAJ glucagon (human recombinant) 1 mg solution for injection  Take by injection route.     HUMALOG KWIKPEN 100 UNIT/ML KiwkPen Inject 14  Units into the skin See admin instructions. Inject 11 units SQ at breakfast, inject 14 units SQ at lunch and inject 16 units SQ at supper. Plus sliding scale 1 unit for every 25 BS is > 120     levothyroxine (SYNTHROID, LEVOTHROID) 88 MCG tablet Take 88 mcg by mouth daily.      Liniments (SALONPAS EX) Apply 1 patch topically daily as needed (for pain).     losartan (COZAAR) 50 MG tablet Take 50 mg by mouth daily.     meclizine (ANTIVERT) 12.5 MG tablet Take 1 tablet (12.5 mg total) by mouth 3 (three) times daily as needed for dizziness. 30 tablet 0   nitroGLYCERIN (NITROSTAT) 0.4 MG SL tablet Place 1 tablet (0.4 mg total) under the tongue every 5 (five) minutes as needed for chest pain. 25  tablet 2   ondansetron (ZOFRAN ODT) 4 MG disintegrating tablet Take 1 tablet (4 mg total) by mouth every 8 (eight) hours as needed for nausea or vomiting. 20 tablet 0   TOUJEO SOLOSTAR 300 UNIT/ML SOPN Inject 22 Units into the skin.      No current facility-administered medications for this visit.    Allergies:   Celecoxib, Lipitor [atorvastatin calcium], Zetia [ezetimibe], Ropinirole hcl, Actos [pioglitazone hydrochloride], Crestor [rosuvastatin calcium], Doxycycline, Duloxetine, Fenofibrate, Fenofibrate, Imdur [isosorbide nitrate], Isosorbide, Metformin and related, Metoprolol succinate [metoprolol], Niaspan [niacin], Pravachol, Pravachol [pravastatin], Ranolazine er, Ropinirole, Rosuvastatin, Niaspan [niacin er], and Ranolazine    Social History:  The patient  reports that she has never smoked. She has never used smokeless tobacco. She reports that she does not drink alcohol and does not use drugs.   Family History:  The patient's family history includes Dementia in her father; Heart attack in her brother and father; Heart disease in her brother and father; Hypertension in her mother; Stroke in her mother.    ROS:   Pertinent items noted in HPI and remainder of comprehensive ROS otherwise  negative.  PHYSICAL EXAM: VS:  BP (!) 170/82    Pulse 77    Ht 5\' 3"  (1.6 m)    Wt 215 lb 9.6 oz (97.8 kg)    SpO2 98%    BMI 38.19 kg/m  , BMI Body mass index is 38.19 kg/m. General appearance: alert, no distress and moderately obese Neck: no carotid bruit and no JVD Lungs: diminished breath sounds bilaterally Heart: regular rate and rhythm Abdomen: soft, non-tender; bowel sounds normal; no masses,  no organomegaly Extremities: edema 1+ pedal edema Pulses: 2+ and symmetric Skin: Skin color, texture, turgor normal. No rashes or lesions Neurologic: Grossly normal Psych: Mildly anxious  EKG:   Normal sinus rhythm at 77, low voltage QRS, incomplete right bundle branch block--personally reviewed  Recent Labs: 08/28/2020: ALT 17; BUN 23; Creatinine, Ser 0.80; Hemoglobin 13.6; Platelets 236; Potassium 3.9; Sodium 142    Lipid Panel    Component Value Date/Time   CHOL 119 04/19/2020 1123   TRIG 98 04/19/2020 1123   HDL 70 04/19/2020 1123   CHOLHDL 1.7 04/19/2020 1123   CHOLHDL 3.8 11/15/2015 0954   VLDL 20 11/15/2015 0954   LDLCALC 31 04/19/2020 1123   LDLDIRECT 148.7 09/14/2012 0857      Wt Readings from Last 3 Encounters:  08/10/21 215 lb 9.6 oz (97.8 kg)  06/08/21 210 lb 9.6 oz (95.5 kg)  01/23/21 211 lb 3.2 oz (95.8 kg)     ASSESSMENT AND PLAN:  Syncope Chronic diastolic congestive heart failure Hypertensive heart disease without heart failure Diabetes mellitus - on insulin Atypical chest pain with normal Myoview stress test in August 2016.  No ischemia.  Ejection fraction 85%. old CVA [facility] at [facility] History of dermatofibro- sarcoma of the spine. Dyslipidemia Osteoarthritis [facility]othyroidism [facility] at [facility]. GERD, followed by Dr. Oletta Lamas  RBBB Peripheral neuropathy   PLAN:  Mrs. Stagliano was found to have recent episodes of SVT on her monitor but cannot tolerate metoprolol because she said it caused joint pain.   Options are quite limited for her but she said she was willing to go back to her Ziac which seem to be tolerated and could help with heart rate and blood pressure.  Her blood pressure is significantly elevated today.  She asked for pain medicine which I deferred to her PCP since we do not prescribe  that.  She reports the gabapentin was not helpful for her.  She should fit bilateral compression stockings to help with her edema.  Pixie Casino, MD, [facility], [place] Director of the Advanced Lipid Disorders &  Cardiovascular Risk Reduction Clinic Diplomate of the American Board of Clinical Lipidology Attending Cardiologist  Direct Dial: [phone]   Fax: [phone]  Website:  [email or web address]   08/10/2021 5:37 PM

## 2021-08-10 NOTE — Patient Instructions (Addendum)
Medication Instructions:  ?START bisoprolol-hctz 10-6.25mg  daily ?STOP metoprolol  ? ?*If you need a refill on your cardiac medications before your next appointment, please call your pharmacy* ? ? ?Lab Work: ?NON-FASTING BMET in about 2 weeks ? ?If you have labs (blood work) drawn today and your tests are completely normal, you will receive your results only by: ?MyChart Message (if you have MyChart) OR ?A paper copy in the mail ?If you have any lab test that is abnormal or we need to change your treatment, we will call you to review the results. ? ? ?Testing/Procedures: ?NONE ? ? ?Follow-Up: ?At [facility], you and your health needs are our priority.  As part of our continuing mission to provide you with exceptional heart care, we have created designated Provider Care Teams.  These Care Teams include your primary Cardiologist (physician) and Advanced Practice Providers (APPs -  Physician Assistants and Nurse Practitioners) who all work together to provide you with the care you need, when you need it. ? ?We recommend signing up for the patient portal called "MyChart".  Sign up information is provided on this After Visit Summary.  MyChart is used to connect with patients for Virtual Visits (Telemedicine).  Patients are able to view lab/test results, encounter notes, upcoming appointments, etc.  Non-urgent messages can be sent to your provider as well.   ?To learn more about what you can do with MyChart, go to [email or web address].   ? ?Your next appointment:   ? ?6 months with Dr. Debara Pickett  ? ?

## 2021-08-13 DIAGNOSIS — E113311 Type 2 diabetes mellitus with moderate nonproliferative diabetic retinopathy with macular edema, right eye: Secondary | ICD-10-CM | POA: Diagnosis not present

## 2021-08-21 ENCOUNTER — Encounter: Payer: Self-pay | Admitting: Internal Medicine

## 2021-08-21 DIAGNOSIS — R001 Bradycardia, unspecified: Secondary | ICD-10-CM | POA: Diagnosis not present

## 2021-08-21 DIAGNOSIS — R55 Syncope and collapse: Secondary | ICD-10-CM | POA: Diagnosis not present

## 2021-08-21 DIAGNOSIS — I4510 Unspecified right bundle-branch block: Secondary | ICD-10-CM | POA: Diagnosis not present

## 2021-08-21 DIAGNOSIS — R000 Tachycardia, unspecified: Secondary | ICD-10-CM | POA: Diagnosis not present

## 2021-08-21 DIAGNOSIS — R231 Pallor: Secondary | ICD-10-CM | POA: Diagnosis not present

## 2021-08-21 DIAGNOSIS — I959 Hypotension, unspecified: Secondary | ICD-10-CM | POA: Diagnosis not present

## 2021-08-21 DIAGNOSIS — R600 Localized edema: Secondary | ICD-10-CM | POA: Diagnosis not present

## 2021-08-21 DIAGNOSIS — E162 Hypoglycemia, unspecified: Secondary | ICD-10-CM | POA: Diagnosis not present

## 2021-08-21 DIAGNOSIS — R110 Nausea: Secondary | ICD-10-CM | POA: Diagnosis not present

## 2021-08-21 DIAGNOSIS — E161 Other hypoglycemia: Secondary | ICD-10-CM | POA: Diagnosis not present

## 2021-08-22 MED ORDER — FUROSEMIDE 40 MG PO TABS: 40.0000 mg | ORAL_TABLET | Freq: Every day | ORAL | 3 refills | Status: DC

## 2021-08-23 DIAGNOSIS — I4510 Unspecified right bundle-branch block: Secondary | ICD-10-CM | POA: Diagnosis not present

## 2021-08-24 ENCOUNTER — Other Ambulatory Visit: Payer: Self-pay | Admitting: Internal Medicine

## 2021-09-27 DIAGNOSIS — Z7409 Other reduced mobility: Secondary | ICD-10-CM | POA: Diagnosis not present

## 2021-09-27 DIAGNOSIS — G4733 Obstructive sleep apnea (adult) (pediatric): Secondary | ICD-10-CM | POA: Diagnosis not present

## 2021-09-27 DIAGNOSIS — G5603 Carpal tunnel syndrome, bilateral upper limbs: Secondary | ICD-10-CM | POA: Diagnosis not present

## 2021-09-27 DIAGNOSIS — Z9989 Dependence on other enabling machines and devices: Secondary | ICD-10-CM | POA: Diagnosis not present

## 2021-09-27 DIAGNOSIS — R200 Anesthesia of skin: Secondary | ICD-10-CM | POA: Diagnosis not present

## 2021-09-27 DIAGNOSIS — H8111 Benign paroxysmal vertigo, right ear: Secondary | ICD-10-CM | POA: Diagnosis not present

## 2021-09-28 DIAGNOSIS — Z20822 Contact with and (suspected) exposure to covid-19: Secondary | ICD-10-CM | POA: Diagnosis not present

## 2021-09-29 DIAGNOSIS — L03115 Cellulitis of right lower limb: Secondary | ICD-10-CM | POA: Diagnosis not present

## 2021-10-01 DIAGNOSIS — Z20822 Contact with and (suspected) exposure to covid-19: Secondary | ICD-10-CM | POA: Diagnosis not present

## 2021-10-03 ENCOUNTER — Ambulatory Visit: Payer: Medicare Other | Admitting: Orthopaedic Surgery

## 2021-10-03 DIAGNOSIS — E113311 Type 2 diabetes mellitus with moderate nonproliferative diabetic retinopathy with macular edema, right eye: Secondary | ICD-10-CM | POA: Diagnosis not present

## 2021-10-09 ENCOUNTER — Encounter: Payer: Self-pay | Admitting: Orthopaedic Surgery

## 2021-10-09 ENCOUNTER — Ambulatory Visit (INDEPENDENT_AMBULATORY_CARE_PROVIDER_SITE_OTHER): Payer: Medicare Other | Admitting: Orthopaedic Surgery

## 2021-10-09 ENCOUNTER — Ambulatory Visit (INDEPENDENT_AMBULATORY_CARE_PROVIDER_SITE_OTHER): Payer: Medicare Other

## 2021-10-09 ENCOUNTER — Telehealth: Payer: Self-pay

## 2021-10-09 DIAGNOSIS — M25511 Pain in right shoulder: Secondary | ICD-10-CM | POA: Diagnosis not present

## 2021-10-09 DIAGNOSIS — M7541 Impingement syndrome of right shoulder: Secondary | ICD-10-CM | POA: Diagnosis not present

## 2021-10-09 DIAGNOSIS — M170 Bilateral primary osteoarthritis of knee: Secondary | ICD-10-CM

## 2021-10-09 DIAGNOSIS — G8929 Other chronic pain: Secondary | ICD-10-CM

## 2021-10-09 NOTE — Progress Notes (Addendum)
? ?Office Visit Note ?  ?Patient: Whitney Newton           ?Date of Birth: 27-Jul-1940           ?MRN: [id number] ?Visit Date: 10/09/2021 ?             ?Requested by: Alroy Dust, L.Marlou Sa, MD ?[address] ?[address] ?[place],  [place] [address] ?PCP: Alroy Dust, L.Marlou Sa, MD ? ? ?Assessment & Plan: ?Visit Diagnoses:  ?1. Chronic right shoulder pain   ?2. Bilateral primary osteoarthritis of knee   ?3. Impingement syndrome of right shoulder   ? ? ?Plan: Ms. Stanzione's office today with multiple joint complaints.  She is having recurrent pain in her left knee related to her osteoarthritis.  She does have a history of hypertension and diabetes and in the past she has had an elevation of both the blood sugar and blood pressure from cortisone so I think we really need to be careful.  She has had viscosupplementation in the past which seem to help so we will asked the insurance company to reapproved that. ? ?She recently was diagnosed with cellulitis of her right foot apparently as a result of an ant bite and was treated with clindamycin by her family physician.  Appears that the cellulitis has resolved.  Her foot was pink and she had +1 pulses but no pain or obvious evidence of infection. ? ?She also recently has been having right shoulder pain and has difficulty raising her arm overhead with positive impingement.  She does have good strength.  I would not be surprised if she has a rotator cuff tear but she is not a surgical candidate related to her multiple medical comorbidities so we will try some exercises.  Again, I am concerned about cortisone injections. ? ?I have seen her for carpal tunnel in the past and she does wear volar wrist splint.  She has seen Dr. Amedeo Plenty and apparently is scheduled for carpal tunnel release.  I would certainly agree with that. ?Discussed for nearly an hour all of the above. ?This patient is diagnosed with osteoarthritis of the knee(s).   ? ?Radiographs show evidence of joint space narrowing,  osteophytes, subchondral sclerosis and/or subchondral cysts.  This patient has knee pain which interferes with functional and activities of daily living.   ? ?This patient has experienced inadequate response, adverse effects and/or intolerance with conservative treatments such as acetaminophen, NSAIDS, topical creams, physical therapy or regular exercise, knee bracing and/or weight loss.  ? ?This patient has experienced inadequate response or has a contraindication to intra articular steroid injections for at least 3 months.  ? ?This patient is not scheduled to have a total knee replacement within 6 months of starting treatment with viscosupplementation. ? ? ?Follow-Up Instructions: Return Pre-CERT viscosupplementation.  ? ?Orders:  ?Orders Placed This Encounter  ?Procedures  ? XR Shoulder Right  ? ?No orders of the defined types were placed in this encounter. ? ? ? ? Procedures: ?No procedures performed ? ? ?Clinical Data: ?No additional findings. ? ? ?Subjective: ?Chief Complaint  ?Patient presents with  ? Right Foot - Pain  ? Right Shoulder - Pain  ? Left Knee - Pain  ?Patient presents today for multiple complaints. She developed right foot cellulits from an ant bite. She saw her PCP and was put on Clindamycin. She finished the antibiotic and is doing much better. She just wants to make sure that her foot looks okay to Dr.Monee Dembeck. She also wants to get a  left knee cortisone injection today. She finished up visco in her left knee last year. She is diabetic. She is on blood thinners and can only take Ibuprofen a couple times weekly. And lastly, she mentioned that her right shoulder started to hurt a couple months ago. No known injury. Her shoulder hurts on and off. Her motion is limited. She is right hand dominant. ? ?HPI ? ?Review of Systems ? ? ?Objective: ?Vital Signs: There were no vitals taken for this visit. ? ?Physical Exam ?Constitutional:   ?   Appearance: She is well-developed.  ?Pulmonary:  ?   Effort:  Pulmonary effort is normal.  ?Skin: ?   General: Skin is warm and dry.  ?Neurological:  ?   Mental Status: She is alert and oriented to person, place, and time.  ?Psychiatric:     ?   Behavior: Behavior normal.  ? ? ?Ortho Exam left knee with mostly medial joint pain today.  Large knees.  She could have a very small effusion.  She had just about full knee extension and flexed about 95 degrees without instability.  She does have nonpitting edema both of the lower extremities.  She has been on Lasix in the past but apparently has been off of that recently.  She also has some venous stasis changes.  Her right foot no longer appears to have a cellulitis as it was nice and pink.  She did describe a large area of redness and puffiness that now has resolved.  She had +1 midfoot pulse and good capillary refill to the toes.  There is no active infection. ? ?Right shoulder with positive impingement and pain with overhead motion.  She is able to place her arm fully overhead.  She had good strength.  There is a little bit of crepitation with internal/external rotation along the anterior subacromial region which I think may be consistent with a rotator cuff tear.  No pain at the [facility] joint.  There is some discomfort in the anterior subacromial region and also in the lateral subacromial region with abduction.  Good grip and good release with the right hand. ? ?She does use a rolling walker to aid with her ambulation ? ?Specialty Comments:  ?No specialty comments available. ? ?Imaging: ?XR Shoulder Right ? ?Result Date: 10/09/2021 ?Films of the right shoulder obtained in several projections.  The humeral head is centered about the glenoid.  Normal space between the humeral head and the acromion.  There are degenerative changes at the [facility] joint with narrowing of the joint space and inferiorly directed spurring particularly on the acromial side that could cause impingement.  No acute changes or obvious other bony lesions  ? ? ?PMFS  History: ?Patient Active Problem List  ? Diagnosis Date Noted  ? Bilateral primary osteoarthritis of knee 10/09/2021  ? Impingement syndrome of right shoulder 10/09/2021  ? Carpal tunnel syndrome, bilateral 01/04/2021  ? Pain in right ankle and joints of right foot 01/04/2021  ? Unilateral primary osteoarthritis, left knee 11/21/2020  ? Pain in right wrist 11/21/2020  ? Acute pain of left knee 12/17/2018  ? Unstable angina (HCC)   ? Benign paroxysmal positional vertigo of right ear 04/08/2017  ? Meralgia paresthetica of both lower extremities 04/08/2017  ? 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 Acute diastolic congestive heart failure ([place]) 12/25/2015  ? Dyspnea 11/22/2015  ? Adrenal adenoma 02/14/2015  ? Arthritis 02/14/2015  ? Diabetes ([place]) 02/14/2015  ? Diabetes mellitus type 2, uncontrolled 02/14/2015  ?  Essential (primary) hypertension 02/14/2015  ? HLD (hyperlipidemia) 02/14/2015  ? BP (high blood pressure) 02/14/2015  ? 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 Migraine aura without headache 02/14/2015  ? Nerve root pain 02/14/2015  ? 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 Cerebral vascular accident ([place]) 02/14/2015  ? Benign essential HTN   ? Esophageal reflux   ? Hepatic hemangioma   ? Acute kidney injury (nontraumatic) ([place]) 01/18/2015  ? Diabetes mellitus type 2, controlled ([place]) 01/18/2015  ? Pain in the chest   ? Lumbar radiculopathy 12/02/2014  ? RBBB 08/02/2014  ? Cerebral atherosclerosis 12/08/2013  ? Obstructive apnea 11/26/2013  ? Cerebral infarct ([place]) 05/12/2013  ? Atherosclerosis of aorta ([place]) 04/12/2013  ? 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 Cutaneous angiolipoma 04/12/2013  ? Chest pain 04/12/2013  ? Skin rash 01/15/2013  ? 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 Fatty tumor 09/09/2012  ? H/O malignant neoplasm of skin 09/09/2012  ? CVA (cerebral vascular accident) ([place]) 07/31/2012  ? Acquired aphasia 05/25/2012  ? Neoplasm of uncertain behavior of skin  09/24/2011  ? Bilateral edema of lower extremity 09/09/2011  ? Diabetes mellitus, type 2 ([place]) 08/15/2011  ? Combined hyperlipidemia associated with type 2 diabetes mellitus ([place]) 08/15/2011  ? Carpal tunnel synd

## 2021-10-09 NOTE — Telephone Encounter (Signed)
Please precert for left knee orthovisc injections. This is Dr.Whitfield's patient. ?Thanks! ? ?

## 2021-10-10 DIAGNOSIS — E039 Hypothyroidism, unspecified: Secondary | ICD-10-CM | POA: Diagnosis not present

## 2021-10-10 DIAGNOSIS — Z794 Long term (current) use of insulin: Secondary | ICD-10-CM | POA: Diagnosis not present

## 2021-10-10 DIAGNOSIS — E1165 Type 2 diabetes mellitus with hyperglycemia: Secondary | ICD-10-CM | POA: Diagnosis not present

## 2021-10-10 DIAGNOSIS — Z79890 Hormone replacement therapy (postmenopausal): Secondary | ICD-10-CM | POA: Diagnosis not present

## 2021-10-10 DIAGNOSIS — Z8673 Personal history of transient ischemic attack (TIA), and cerebral infarction without residual deficits: Secondary | ICD-10-CM | POA: Diagnosis not present

## 2021-10-10 DIAGNOSIS — E1140 Type 2 diabetes mellitus with diabetic neuropathy, unspecified: Secondary | ICD-10-CM | POA: Diagnosis not present

## 2021-10-10 DIAGNOSIS — E1159 Type 2 diabetes mellitus with other circulatory complications: Secondary | ICD-10-CM | POA: Diagnosis not present

## 2021-10-18 NOTE — Telephone Encounter (Signed)
Noted  

## 2021-11-07 DIAGNOSIS — E113311 Type 2 diabetes mellitus with moderate nonproliferative diabetic retinopathy with macular edema, right eye: Secondary | ICD-10-CM | POA: Diagnosis not present

## 2021-11-13 DIAGNOSIS — M5414 Radiculopathy, thoracic region: Secondary | ICD-10-CM | POA: Diagnosis not present

## 2021-11-13 DIAGNOSIS — Z7982 Long term (current) use of aspirin: Secondary | ICD-10-CM | POA: Diagnosis not present

## 2021-11-13 DIAGNOSIS — Z888 Allergy status to other drugs, medicaments and biological substances status: Secondary | ICD-10-CM | POA: Diagnosis not present

## 2021-11-13 DIAGNOSIS — Z881 Allergy status to other antibiotic agents status: Secondary | ICD-10-CM | POA: Diagnosis not present

## 2021-11-13 DIAGNOSIS — G4730 Sleep apnea, unspecified: Secondary | ICD-10-CM | POA: Diagnosis not present

## 2021-11-13 DIAGNOSIS — Z7409 Other reduced mobility: Secondary | ICD-10-CM | POA: Diagnosis not present

## 2021-11-13 DIAGNOSIS — Z7902 Long term (current) use of antithrombotics/antiplatelets: Secondary | ICD-10-CM | POA: Diagnosis not present

## 2021-11-13 DIAGNOSIS — G5602 Carpal tunnel syndrome, left upper limb: Secondary | ICD-10-CM | POA: Diagnosis not present

## 2021-11-13 DIAGNOSIS — I639 Cerebral infarction, unspecified: Secondary | ICD-10-CM | POA: Diagnosis not present

## 2021-11-13 DIAGNOSIS — Z79899 Other long term (current) drug therapy: Secondary | ICD-10-CM | POA: Diagnosis not present

## 2021-11-13 DIAGNOSIS — G5603 Carpal tunnel syndrome, bilateral upper limbs: Secondary | ICD-10-CM | POA: Diagnosis not present

## 2021-11-13 DIAGNOSIS — I6601 Occlusion and stenosis of right middle cerebral artery: Secondary | ICD-10-CM | POA: Diagnosis not present

## 2021-11-13 DIAGNOSIS — Z9989 Dependence on other enabling machines and devices: Secondary | ICD-10-CM | POA: Diagnosis not present

## 2021-11-15 DIAGNOSIS — M79642 Pain in left hand: Secondary | ICD-10-CM | POA: Diagnosis not present

## 2021-11-15 DIAGNOSIS — G5603 Carpal tunnel syndrome, bilateral upper limbs: Secondary | ICD-10-CM | POA: Diagnosis not present

## 2021-11-15 DIAGNOSIS — M79641 Pain in right hand: Secondary | ICD-10-CM | POA: Diagnosis not present

## 2021-11-15 DIAGNOSIS — E119 Type 2 diabetes mellitus without complications: Secondary | ICD-10-CM | POA: Diagnosis not present

## 2021-11-16 ENCOUNTER — Other Ambulatory Visit: Payer: Self-pay | Admitting: Internal Medicine

## 2021-11-16 DIAGNOSIS — I2510 Atherosclerotic heart disease of native coronary artery without angina pectoris: Secondary | ICD-10-CM

## 2021-11-16 DIAGNOSIS — E782 Mixed hyperlipidemia: Secondary | ICD-10-CM

## 2021-11-16 DIAGNOSIS — I700 Atherosclerosis of aorta: Secondary | ICD-10-CM

## 2021-11-19 ENCOUNTER — Telehealth: Payer: Self-pay

## 2021-11-19 NOTE — Telephone Encounter (Signed)
   Pre-operative Risk Assessment    Patient Name: Whitney Newton  DOB: 1941-04-16 MRN: [id number]      Request for Surgical Clearance    Procedure:   Left Carpal Tunnel Release  Date of Surgery:  Clearance TBD                                 Surgeon:  Dr. Roseanne Kaufman Surgeon's Group or Practice Name:  [facility] Phone number:  [phone] Fax number:  [phone]   Type of Clearance Requested:   - Pharmacy:  Hold Clopidogrel (Plavix) 3   Type of Anesthesia:  Local    Additional requests/questions:    Signed, Wonda Horner   11/19/2021, 3:16 PM

## 2021-11-20 NOTE — Telephone Encounter (Signed)
Patient's Plavix is prescribed primarily for history of stroke. The request to hold for patient's upcoming procedure would need to be addressed by neurology.   Are you requesting medical clearance from cardiology? If so, please direct request back to [name] with that information.  Thank you,  Emmaline Life, NP-C    11/20/2021, 10:36 AM [place] [address] Office [phone] Fax [phone]

## 2021-11-28 ENCOUNTER — Telehealth: Payer: Self-pay | Admitting: Orthopaedic Surgery

## 2021-11-28 NOTE — Telephone Encounter (Signed)
PT called for an update on gel injections. Please call pt at [phone].

## 2021-11-28 NOTE — Telephone Encounter (Signed)
Talked with patient and appointments have been scheduled.

## 2021-11-29 ENCOUNTER — Encounter: Payer: Self-pay | Admitting: Orthopaedic Surgery

## 2021-11-29 ENCOUNTER — Ambulatory Visit (INDEPENDENT_AMBULATORY_CARE_PROVIDER_SITE_OTHER): Payer: Medicare Other | Admitting: Orthopaedic Surgery

## 2021-11-29 DIAGNOSIS — M1712 Unilateral primary osteoarthritis, left knee: Secondary | ICD-10-CM | POA: Diagnosis not present

## 2021-11-29 MED ORDER — HYALURONAN 30 MG/2ML IX SOSY
30.0000 mg | PREFILLED_SYRINGE | INTRA_ARTICULAR | Status: AC | PRN
  Administered 2021-11-29: 30 mg via INTRA_ARTICULAR

## 2021-12-03 ENCOUNTER — Telehealth: Payer: Self-pay | Admitting: Internal Medicine

## 2021-12-03 NOTE — Telephone Encounter (Signed)
   Pre-operative Risk Assessment    Patient Name: Whitney Newton  DOB: 1940/08/22 MRN: [id number]      Request for Surgical Clearance    Procedure:   left carpal tunnel release 3 Date of Surgery:  Clearance TBD                                 Surgeon:  Dr. Dominica Severin Surgeon's Group or Practice Name:  [facility] Phone number:  [phone] Fax number:  [phone] (attn: Rosalva Ferron)   Type of Clearance Requested:   - Medical  - Pharmacy:  Hold Clopidogrel (Plavix) - # days unspecified    Type of Anesthesia:  Local    Additional requests/questions:   n/a  Burman Nieves   12/03/2021, 12:08 PM

## 2021-12-04 NOTE — Telephone Encounter (Signed)
Ok to hold Plavix 5 days prior to procedure and aspirin up to 7 days if needed. Resume after.  Dr. Rennis Golden

## 2021-12-05 ENCOUNTER — Telehealth: Payer: Self-pay | Admitting: *Deleted

## 2021-12-05 NOTE — Telephone Encounter (Signed)
Pt agreeable to plan of care for tele pre op appt 12/21/21 @ 2 pm. Med rec and consent are done.

## 2021-12-06 ENCOUNTER — Ambulatory Visit (INDEPENDENT_AMBULATORY_CARE_PROVIDER_SITE_OTHER): Payer: Medicare Other | Admitting: Orthopaedic Surgery

## 2021-12-06 ENCOUNTER — Encounter: Payer: Self-pay | Admitting: Orthopaedic Surgery

## 2021-12-06 DIAGNOSIS — M1712 Unilateral primary osteoarthritis, left knee: Secondary | ICD-10-CM | POA: Diagnosis not present

## 2021-12-06 DIAGNOSIS — I2510 Atherosclerotic heart disease of native coronary artery without angina pectoris: Secondary | ICD-10-CM

## 2021-12-06 MED ORDER — HYALURONAN 30 MG/2ML IX SOSY
30.0000 mg | PREFILLED_SYRINGE | INTRA_ARTICULAR | Status: AC | PRN
  Administered 2021-12-06: 30 mg via INTRA_ARTICULAR

## 2021-12-06 NOTE — Progress Notes (Signed)
Office Visit Note   Patient: Whitney Newton           Date of Birth: 1941-05-18           MRN: [id number] Visit Date: 12/06/2021              Requested by: Alroy Dust, L.Marlou Sa, [place] [address] [place],  [place] [address] PCP: Alroy Dust, L.Marlou Sa, MD   Assessment & Plan: Visit Diagnoses:  1. Unilateral primary osteoarthritis, left knee     Plan: Pleasant 81 year old woman presents for second Orthovisc injection into her left knee.  She does think she got a little relief from the first injection.  She did have some increased pain but she admits her knee felt well enough that she was doing more activity than she probably should have.   Follow-Up Instructions: Return in about 1 week (around 12/13/2021).   Orders:  No orders of the defined types were placed in this encounter.  No orders of the defined types were placed in this encounter.     Procedures: Large Joint Inj: L knee on 12/06/2021 2:36 PM Indications: pain and diagnostic evaluation Details: 25 G 1.5 in needle, anteromedial approach  Arthrogram: No  Medications: 30 mg Hyaluronan 30 MG/2ML Outcome: tolerated well, no immediate complications Procedure, treatment alternatives, risks and benefits explained, specific risks discussed. Consent was given by the patient.     Clinical Data: No additional findings.   Subjective: Chief Complaint  Patient presents with   Left Knee - Follow-up   Patient presents today for Orthovisc #2 in her left knee. She states that the first injection gave relief of pain for around 1-2 days and then she stated to have increased pain while walking. Patient is using a rolling walker to help with balance and walking.   Review of Systems  All other systems reviewed and are negative.    Objective: Vital Signs: There were no vitals taken for this visit.  Physical Exam Constitutional:      Appearance: Normal appearance.  Neurological:     Mental Status: She is alert.      Ortho Exam Examination of her left knee no effusion no erythema or cellulitis compartments are soft Specialty Comments:  No specialty comments available.  Imaging: No results found.   PMFS History: Patient Active Problem List   Diagnosis Date Noted   Bilateral primary osteoarthritis of knee 10/09/2021   Impingement syndrome of right shoulder 10/09/2021   Carpal tunnel syndrome, bilateral 01/04/2021   Pain in right ankle and joints of right foot 01/04/2021   Unilateral primary osteoarthritis, left knee 11/21/2020   Pain in right wrist 11/21/2020   Acute pain of left knee 12/17/2018   Unstable angina (HCC)    Benign paroxysmal positional vertigo of right ear 04/08/2017   Meralgia paresthetica of both lower extremities 04/08/2017   Stricture of artery ([place]) 97/98/9211   Acute diastolic congestive heart failure ([place]) 12/25/2015   Dyspnea 11/22/2015   Adrenal adenoma 02/14/2015   Arthritis 02/14/2015   Diabetes ([place]) 02/14/2015   Diabetes mellitus type 2, uncontrolled 02/14/2015   Essential (primary) hypertension 02/14/2015   HLD (hyperlipidemia) 02/14/2015   BP (high blood pressure) 02/14/2015   Disc disease with myelopathy, 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   Migraine aura without headache 02/14/2015   Nerve root pain 02/14/2015   Right hemisphere, 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[place]) 02/14/2015   Cerebral vascular accident ([place]) 02/14/2015   Benign essential HTN    Esophageal reflux    Hepatic hemangioma  Acute kidney injury (nontraumatic) ([place]) 01/18/2015   Diabetes mellitus type 2, controlled ([place]) 01/18/2015   Pain in the chest    Lumbar radiculopathy 12/02/2014   RBBB 08/02/2014   Cerebral atherosclerosis 12/08/2013   Obstructive apnea 11/26/2013   Cerebral infarct ([place]) 05/12/2013   Atherosclerosis of aorta ([place]) 04/12/2013   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   Cutaneous angiolipoma 04/12/2013   Chest pain 04/12/2013   Skin rash 01/15/2013   Neuroendocrine tumor  09/14/2012   Fatty tumor 09/09/2012   H/O malignant neoplasm of skin 09/09/2012   CVA (cerebral vascular accident) ([place]) 07/31/2012   Acquired aphasia 05/25/2012   Neoplasm of uncertain behavior of skin 09/24/2011   Bilateral edema of lower extremity 09/09/2011   Diabetes mellitus, type 2 ([place]) 08/15/2011   Combined hyperlipidemia associated with type 2 diabetes mellitus ([place]) 08/15/2011   Carpal tunnel syndrome 08/07/2011   Lesion, thoracic root 08/07/2011   Cervical nerve root disorder 07/18/2011   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   Hypothyroidism 09/18/2010   Benign hypertensive heart disease without heart failure 09/18/2010   Dyslipidemia 09/18/2010   Type II or unspecified type diabetes mellitus without mention of complication, uncontrolled 09/18/2010   Hemangioma of liver 09/18/2010   Postmenopausal state 09/18/2010   Chest pain 09/18/2010   Dizziness 09/18/2010   Past Medical History:  Diagnosis Date   Acute diastolic (congestive) heart failure ([place]) 12/25/2015   Back pain    prior back surgery in 2009 - slow to recover   Cancer ([place])    Diabetes ([place])    Diverticulitis    Gastritis    Hemangioma of liver    managed conservatively; followed at [facility] (hyperlipidemia)    statin intolerant   Hypertension    Normal cardiac stress test 2011   Obesity    Thyroid disease    hypothyroidism    Family History  Problem Relation Age of Onset   Stroke Mother    Hypertension Mother    Heart disease Father    Heart attack Father    Dementia Father    Heart disease Brother    Heart attack Brother     Past Surgical History:  Procedure Laterality Date   ABDOMINAL HYSTERECTOMY     APPENDECTOMY     LEFT HEART CATH AND CORONARY ANGIOGRAPHY N/A 02/11/2018   Procedure: LEFT HEART CATH AND CORONARY ANGIOGRAPHY;  Surgeon: Troy Sine, MD;  Location: [facility];  Service: Cardiovascular;  Laterality: N/A;   NASAL RECONSTRUCTION     Social History    Occupational History   Not on file  Tobacco Use   Smoking status: Never   Smokeless tobacco: Never  Substance and Sexual Activity   Alcohol use: No   Drug use: No   Sexual activity: Never

## 2021-12-07 DIAGNOSIS — E113311 Type 2 diabetes mellitus with moderate nonproliferative diabetic retinopathy with macular edema, right eye: Secondary | ICD-10-CM | POA: Diagnosis not present

## 2021-12-10 ENCOUNTER — Other Ambulatory Visit: Payer: Self-pay

## 2021-12-10 DIAGNOSIS — M1712 Unilateral primary osteoarthritis, left knee: Secondary | ICD-10-CM

## 2021-12-13 ENCOUNTER — Encounter: Payer: Self-pay | Admitting: Orthopaedic Surgery

## 2021-12-13 ENCOUNTER — Ambulatory Visit (INDEPENDENT_AMBULATORY_CARE_PROVIDER_SITE_OTHER): Payer: Medicare Other | Admitting: Orthopaedic Surgery

## 2021-12-13 DIAGNOSIS — M1712 Unilateral primary osteoarthritis, left knee: Secondary | ICD-10-CM | POA: Diagnosis not present

## 2021-12-13 DIAGNOSIS — M170 Bilateral primary osteoarthritis of knee: Secondary | ICD-10-CM

## 2021-12-13 MED ORDER — HYALURONAN 30 MG/2ML IX SOSY
30.0000 mg | PREFILLED_SYRINGE | INTRA_ARTICULAR | Status: AC | PRN
  Administered 2021-12-13: 30 mg via INTRA_ARTICULAR

## 2021-12-13 NOTE — Progress Notes (Signed)
Office Visit Note   Patient: Whitney Newton           Date of Birth: [birth year]           MRN: [id number] Visit Date: 12/13/2021              Requested by: Alroy Dust, L.Marlou Sa, [place] [address] [place],  [place] [address] PCP: Alroy Dust, L.Marlou Sa, MD   Assessment & Plan: Visit Diagnoses:  1. Bilateral primary osteoarthritis of knee   2. Unilateral primary osteoarthritis, left knee     Plan:  Patient is here for her third Orthovisc injection into her left knee.  She said she has had a lot of fullness feeling in the knee after each injection not sure how much is helped her.  We discussed that often relief is obtained after the final injection Follow-Up Instructions: as needed  Orders:  Orders Placed This Encounter  Procedures   Large Joint Inj   No orders of the defined types were placed in this encounter.     Procedures: Large Joint Inj on 12/13/2021 1:42 PM Indications: pain and diagnostic evaluation Details: 25 G 1.5 in needle, anteromedial approach  Arthrogram: No  Medications: 30 mg Hyaluronan 30 MG/2ML Outcome: tolerated well, no immediate complications Procedure, treatment alternatives, risks and benefits explained, specific risks discussed. Consent was given by the patient.      Clinical Data: No additional findings.   Subjective: Chief Complaint  Patient presents with   Left Knee - Follow-up    Orthovisc #3  Patient presents today for the third and final orthovisc into her left knee.     Review of Systems  All other systems reviewed and are negative.    Objective: Vital Signs: There were no vitals taken for this visit.  Physical Exam Constitutional:      Appearance: Normal appearance.  Neurological:     Mental Status: She is alert.     Ortho Exam Examination of her left knee she has no redness no cellulitis no effusion Specialty Comments:  No specialty comments available.  Imaging: No results found.   PMFS  History: Patient Active Problem List   Diagnosis Date Noted   Bilateral primary osteoarthritis of knee 10/09/2021   Impingement syndrome of right shoulder 10/09/2021   Carpal tunnel syndrome, bilateral 01/04/2021   Pain in right ankle and joints of right foot 01/04/2021   Unilateral primary osteoarthritis, left knee 11/21/2020   Pain in right wrist 11/21/2020   Acute pain of left knee 12/17/2018   Unstable angina (HCC)    Benign paroxysmal positional vertigo of right ear 04/08/2017   Meralgia paresthetica of both lower extremities 04/08/2017   Stricture of artery ([place]) 09/38/1829   Acute diastolic congestive heart failure ([place]) 12/25/2015   Dyspnea 11/22/2015   Adrenal adenoma 02/14/2015   Arthritis 02/14/2015   Diabetes ([place]) 02/14/2015   Diabetes mellitus type 2, uncontrolled 02/14/2015   Essential (primary) hypertension 02/14/2015   HLD (hyperlipidemia) 02/14/2015   BP (high blood pressure) 02/14/2015   Disc disease with myelopathy, 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   Migraine aura without headache 02/14/2015   Nerve root pain 02/14/2015   Right hemisphere, 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[place]) 02/14/2015   Cerebral vascular accident ([place]) 02/14/2015   Benign essential HTN    Esophageal reflux    Hepatic hemangioma    Acute kidney injury (nontraumatic) ([place]) 01/18/2015   Diabetes mellitus type 2, controlled ([place]) 01/18/2015   Pain in the chest    Lumbar radiculopathy 12/02/2014  RBBB 08/02/2014   Cerebral atherosclerosis 12/08/2013   Obstructive apnea 11/26/2013   Cerebral infarct ([place]) 05/12/2013   Atherosclerosis of aorta ([place]) 04/12/2013   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   Cutaneous angiolipoma 04/12/2013   Chest pain 04/12/2013   Skin rash 01/15/2013   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/14/2012   Fatty tumor 09/09/2012   H/O malignant neoplasm of skin 09/09/2012   CVA (cerebral vascular accident) ([place]) 07/31/2012   Acquired aphasia 05/25/2012   Neoplasm of uncertain behavior of skin  09/24/2011   Bilateral edema of lower extremity 09/09/2011   Diabetes mellitus, type 2 ([place]) 08/15/2011   Combined hyperlipidemia associated with type 2 diabetes mellitus ([place]) 08/15/2011   Carpal tunnel syndrome 08/07/2011   Lesion, thoracic root 08/07/2011   Cervical nerve root disorder 07/18/2011   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   Hypothyroidism 09/18/2010   Benign hypertensive heart disease without heart failure 09/18/2010   Dyslipidemia 09/18/2010   Type II or unspecified type diabetes mellitus without mention of complication, uncontrolled 09/18/2010   Hemangioma of liver 09/18/2010   Postmenopausal state 09/18/2010   Chest pain 09/18/2010   Dizziness 09/18/2010   Past Medical History:  Diagnosis Date   Acute diastolic (congestive) heart failure ([place]) 12/25/2015   Back pain    prior back surgery in 2009 - slow to recover   Cancer ([place])    Diabetes ([place])    Diverticulitis    Gastritis    Hemangioma of liver    managed conservatively; followed at [facility] (hyperlipidemia)    statin intolerant   Hypertension    Normal cardiac stress test 2011   Obesity    Thyroid disease    hypothyroidism    Family History  Problem Relation Age of Onset   Stroke Mother    Hypertension Mother    Heart disease Father    Heart attack Father    Dementia Father    Heart disease Brother    Heart attack Brother     Past Surgical History:  Procedure Laterality Date   ABDOMINAL HYSTERECTOMY     APPENDECTOMY     LEFT HEART CATH AND CORONARY ANGIOGRAPHY N/A 02/11/2018   Procedure: LEFT HEART CATH AND CORONARY ANGIOGRAPHY;  Surgeon: Troy Sine, MD;  Location: [facility];  Service: Cardiovascular;  Laterality: N/A;   NASAL RECONSTRUCTION     Social History   Occupational History   Not on file  Tobacco Use   Smoking status: Never   Smokeless tobacco: Never  Substance and Sexual Activity   Alcohol use: No   Drug use: No   Sexual activity: Never

## 2021-12-21 ENCOUNTER — Ambulatory Visit: Payer: Medicare Other | Admitting: Physician Assistant

## 2021-12-21 ENCOUNTER — Encounter: Payer: Self-pay | Admitting: Physician Assistant

## 2021-12-21 NOTE — Telephone Encounter (Signed)
Pt has been scheduled for in office appt 12/27/21

## 2021-12-21 NOTE — Progress Notes (Addendum)
Virtual Visit via Telephone Note   Because of Whitney Newton's co-morbid illnesses, she is at least at moderate risk for complications without adequate follow up.  This format is felt to be most appropriate for this patient at this time.  The patient did not have access to video technology/had technical difficulties with video requiring transitioning to audio format only (telephone).  All issues noted in this document were discussed and addressed.  No physical exam could be performed with this format.  Please refer to the patient's chart for her consent to telehealth for [facility].  Evaluation Performed:  Preoperative cardiovascular risk assessment _____________   Date:  12/21/2021   Patient ID:  AL GAGEN, DOB [birth year], MRN [id number] Patient Location:  Home Provider location:   Office  Primary Care Provider:  Alroy Dust, L.Marlou Sa, MD Primary Cardiologist:  Pixie Casino, MD  Chief Complaint / Patient Profile   81 y.o. y/o female with a h/o back pain with prior surgery 2009, CVA, cancer, diabetes, hypertension, hyperlipidemia, hypothyroidism who is pending left carpal tunnel release and presents today for telephonic preoperative cardiovascular risk assessment.  Past Medical History    Past Medical History:  Diagnosis Date   Acute diastolic (congestive) heart failure ([place]) 12/25/2015   Back pain    prior back surgery in 2009 - slow to recover   Cancer ([place])    Diabetes ([place])    Diverticulitis    Gastritis    Hemangioma of liver    managed conservatively; followed at [place] (hyperlipidemia)    statin intolerant   Hypertension    Normal cardiac stress test 2011   Obesity    Thyroid disease    hypothyroidism   Past Surgical History:  Procedure Laterality Date   ABDOMINAL HYSTERECTOMY     APPENDECTOMY     LEFT HEART CATH AND CORONARY ANGIOGRAPHY N/A 02/11/2018   Procedure: LEFT HEART CATH AND CORONARY ANGIOGRAPHY;  Surgeon: Troy Sine, MD;  Location:  [place] [facility];  Service: Cardiovascular;  Laterality: N/A;   NASAL RECONSTRUCTION      Allergies  Allergies  Allergen Reactions   Celecoxib Shortness Of Breath   Lipitor [Atorvastatin Calcium] Shortness Of Breath and Other (See Comments)    CHEST PAIN   Zetia [Ezetimibe] Shortness Of Breath and Other (See Comments)    NO SLEEP   Ropinirole Hcl Other (See Comments)    Restless legs syndrome.   Actos [Pioglitazone Hydrochloride] Swelling   Crestor [Rosuvastatin Calcium] Other (See Comments)    LEG PAIN   Doxycycline Other (See Comments)    gastritis   Duloxetine    Fenofibrate Other (See Comments)    unknown   Fenofibrate    Imdur [Isosorbide Nitrate] Other (See Comments)    headache   Isosorbide    Metformin And Related Other (See Comments)    unknown   Metoprolol Succinate [Metoprolol] Other (See Comments)    Joint pain   Niaspan [Niacin]    Pravachol Other (See Comments)    Leg pain   Pravachol [Pravastatin]    Ranolazine Er Other (See Comments)    EDEMA   Ropinirole    Rosuvastatin Other (See Comments)    LEG PAIN   Niaspan [Niacin Er] Rash   Ranolazine Other (See Comments)    EDEMA Pt can not remember    History of Present Illness    Whitney Newton is a 81 y.o. female who presents via Engineer, civil (consulting) for a telehealth  visit today.  Pt was last seen in cardiology clinic on 08/10/2021 by Dr. Debara Pickett.  At that time KOURTNI STINEMAN was doing well.  The patient is now pending procedure as outlined above.   Since her last visit, she has been doing pretty well other than her heart rate increasing to the 120s to 140s after a sizable meal.  This happened a few times but has not happened in a while.  She did have a monitor back in December when she had a syncopal event but there was some confusion with the monitor and it not sticking appropriately to her chest wall, therefore the results may have been inaccurate.  We will arrange an in person appointment due to her  elevated heart rate.  Also she states that at times her blood pressure has been elevated.  As far as her activity level goes she uses a rolling walker mostly for pain.  She has a lot of pain in her left knee which is bone-on-bone.  She can do stairs if she takes her time and puts 2 hands on the railing for support.  She does some household stuff but her children come over from time to time and help her make her bed.  They also bring her food from outside the home.  She states that she had been weighing about the same for several weeks and then once her children started bringing in food with more sodium her weight increased 210 pounds.  Her dry weight is considered to be 206 pounds.  She does have help with her yard.  She has a few tomato plants that she is able to take care of.  She scored 4.64 METS on the DASI.  This is above the 4 METS minimum requirement.  Recent BP and HR log: 143/74, 129 127 bpm  July 8th 158/74, 74 167/73, 75  Per Dr. Debara Pickett okay to hold Plavix for 5 days prior to the procedure and aspirin for up to 7 days prior to the procedure if needed.  Resume when medically safe to do so.  Of note, the patient appears to be on DAPT for stroke.  Will need clearance from neurology from their standpoint  Home Medications    Prior to Admission medications   Medication Sig Start Date End Date Taking? Authorizing Provider  aspirin 81 MG tablet Take 81 mg by mouth daily.    [provider]  clopidogrel (PLAVIX) 75 MG tablet Take 1 tablet (75 mg total) by mouth daily. 04/10/21   Hilty, Nadean Corwin, MD  colesevelam (WELCHOL) 625 MG tablet Take 3 tablets (1,875 mg total) by mouth 2 (two) times daily as needed (cholesterol). 02/15/15   Darlin Coco, MD  diclofenac sodium (VOLTAREN) 1 % GEL Apply 1 application topically at bedtime as needed (for pain).    [provider]  Evolocumab 140 MG/ML SOAJ Inject 1 mL into the skin every 14 (fourteen) days. 11/16/21   Hilty, Nadean Corwin, MD   gabapentin (NEURONTIN) 100 MG capsule Take 3 capsules ('300mg'$ ) by mouth at night and take additional 1-3 capsules daily as needed for breakthrough. 06/08/21   Hilty, Nadean Corwin, MD  Glucagon 0.5 MG/0.1ML SOAJ glucagon (human recombinant) 1 mg solution for injection  Take by injection route.    [provider]  HUMALOG KWIKPEN 100 UNIT/ML KiwkPen Inject 14 Units into the skin See admin instructions. Inject 11 units SQ at breakfast, inject 14 units SQ at lunch and inject 16 units SQ at supper. Plus  sliding scale 1 unit for every 25 BS is > 120 08/03/16   [provider]  Hyoscyamine Sulfate 0.375 MG TBCR Take 1 tablet every 12 hours by oral route.    [provider]  levothyroxine (SYNTHROID, LEVOTHROID) 88 MCG tablet Take 88 mcg by mouth daily.  07/28/14   [provider]  Liniments (SALONPAS EX) Apply 1 patch topically daily as needed (for pain).    [provider]  losartan (COZAAR) 50 MG tablet Take 50 mg by mouth daily.    [provider]  meclizine (ANTIVERT) 12.5 MG tablet Take 1 tablet (12.5 mg total) by mouth 3 (three) times daily as needed for dizziness. 08/28/20   Lucrezia Starch, MD  nitroGLYCERIN (NITROSTAT) 0.4 MG SL tablet Place 1 tablet (0.4 mg total) under the tongue every 5 (five) minutes as needed for chest pain. 08/09/19   Hilty, Nadean Corwin, MD  ondansetron (ZOFRAN ODT) 4 MG disintegrating tablet Take 1 tablet (4 mg total) by mouth every 8 (eight) hours as needed for nausea or vomiting. 08/28/20   Lucrezia Starch, MD  TOUJEO SOLOSTAR 300 UNIT/ML SOPN Inject 22 Units into the skin.  02/19/19   [provider]    Physical Exam    Vital Signs:  Vernell Morgans does not have vital signs available for review today.  Given telephonic nature of communication, physical exam is limited. AAOx3. NAD. Normal affect.  Speech and respirations are unlabored.  Accessory Clinical Findings    None  Assessment & Plan    1.   Preoperative Cardiovascular Risk Assessment:  Ms. Macinnes's perioperative risk of a major cardiac event is 6.6% according to the Revised Cardiac Risk Index (RCRI).  Therefore, she is at high risk for perioperative complications.   Her functional capacity is fair at 4.64 METs according to the Duke Activity Status Index (DASI). Recommendations: According to ACC/AHA guidelines, no further cardiovascular testing needed.  The patient may proceed to surgery at acceptable risk.   Antiplatelet and/or Anticoagulation Recommendations: Aspirin can be held for 7 days prior to her surgery.  Please resume Aspirin post operatively when it is felt to be safe from a bleeding standpoint.  Clopidogrel (Plavix) can be held for 5 days prior to her surgery and resumed as soon as possible post op.   *Need additional input from neurology due to history of stroke  A copy of this note will be routed to requesting surgeon.  Time:   Today, I have spent 15 minutes with the patient with telehealth technology discussing medical history, symptoms, and management plan.     Elgie Collard, PA-C  12/21/2021, 3:34 PM

## 2021-12-21 NOTE — Telephone Encounter (Signed)
Pt has been scheduled to see Caron Presume, PA-C, 12/27/21, clearance will be addressed at that time.  Will route to the requesting surgeon's office to make them aware.

## 2021-12-26 NOTE — Progress Notes (Signed)
Cardiology Office Note:    Date:  12/27/2021   ID:  Whitney Newton, DOB 15-May-1941, MRN [id number]  PCP:  Whitney Newton.Whitney Newton, [place] Cardiologist: Pixie Casino, MD   Reason for visit: Cardiac clearance/lower extremity edema  History of Present Illness:    Whitney Newton is a 81 y.o. female with a hx of  back pain with prior surgery 2009, CVA in 2014 -continue Plavix and restarted aspirin, cancer, diabetes, hypertension, hyperlipidemia, hypothyroidism, chronic diastolic heart failure, CAD -mild to moderate nonobstructive disease on left heart cath 2019.  To note patient has numerous medication intolerances.  She last saw Dr. Debara Pickett in March 2023.  She had worsening edema with 5 pound weight gain.  Blood pressure was poorly controlled off Ziac - therefore Ziac was restarted.  Recommend compression stockings.  Patient recent episodes of short runs SVT on Zio monitor 05/2021 but cannot tolerate metoprolol secondary to joint pain.  She had a telephone preop visit with Nicholes Rough PA-C on December 21, 2021 for left carpal tunnel release.  Recommended to hold Plavix for 5 days preop.  Patient's preop cardiac risk was 6.6%.  No further testing advised.  Today, patient states she is having carpal tunnel surgery soon.  She is difficult to follow during conversation.  She comes in with her husband who she states has stage IV cancer with suspected life expectancy of 6 months unless this new medication is effective.  She mentions she stopped her Ziac and Lasix several months ago.  She patient's caused her to urinate too much/urinary incontinence.  She does not think they helped her lower extremity edema.  She states her weight was down for a while but she has gained a few pounds.  Otherwise she states she has dyspnea on moderate exertion.  She will also feel short of breath after eating a large meal and gets choked pretty easily.  She is compliant with CPAP.  She had mild chest pain a couple  days ago but denies exertional chest pain.  No palpitations.  She has occasional lightheadedness.    Past Medical History:  Diagnosis Date   Acute diastolic (congestive) heart failure ([place]) 12/25/2015   Back pain    prior back surgery in 2009 - slow to recover   Cancer ([place])    Diabetes ([place])    Diverticulitis    Gastritis    Hemangioma of liver    managed conservatively; followed at [place] (hyperlipidemia)    statin intolerant   Hypertension    Normal cardiac stress test 2011   Obesity    Thyroid disease    hypothyroidism    Past Surgical History:  Procedure Laterality Date   ABDOMINAL HYSTERECTOMY     APPENDECTOMY     LEFT HEART CATH AND CORONARY ANGIOGRAPHY N/A 02/11/2018   Procedure: LEFT HEART CATH AND CORONARY ANGIOGRAPHY;  Surgeon: Troy Sine, MD;  Location: [facility];  Service: Cardiovascular;  Laterality: N/A;   NASAL RECONSTRUCTION      Current Medications: Current Meds  Medication Sig   aspirin 81 MG tablet Take 81 mg by mouth daily.   clopidogrel (PLAVIX) 75 MG tablet Take 1 tablet (75 mg total) by mouth daily.   colesevelam (WELCHOL) 625 MG tablet Take 3 tablets (1,875 mg total) by mouth 2 (two) times daily as needed (cholesterol).   diclofenac sodium (VOLTAREN) 1 % GEL Apply 1 application topically at bedtime as needed (for pain).   Evolocumab 140  MG/ML SOAJ Inject 1 mL into the skin every 14 (fourteen) days.   famotidine (PEPCID) 10 MG tablet Take 10 mg by mouth daily in the afternoon.   gabapentin (NEURONTIN) 100 MG capsule Take 3 capsules ('300mg'$ ) by mouth at night and take additional 1-3 capsules daily as needed for breakthrough.   HUMALOG KWIKPEN 100 UNIT/ML KiwkPen Inject 14 Units into the skin See admin instructions. Inject 11 units SQ at breakfast, inject 14 units SQ at lunch and inject 16 units SQ at supper. Plus sliding scale 1 unit for every 25 BS is > 120   Hyoscyamine Sulfate 0.375 MG TBCR Take 1 tablet every 12 hours by oral  route.   levothyroxine (SYNTHROID, LEVOTHROID) 88 MCG tablet Take 88 mcg by mouth daily.    Liniments (SALONPAS EX) Apply 1 patch topically daily as needed (for pain).   losartan (COZAAR) 50 MG tablet Take 50 mg by mouth daily.   ondansetron (ZOFRAN ODT) 4 MG disintegrating tablet Take 1 tablet (4 mg total) by mouth every 8 (eight) hours as needed for nausea or vomiting.   torsemide (DEMADEX) 20 MG tablet Take 1 tablet (20 mg total) by mouth once for 1 dose.   TOUJEO SOLOSTAR 300 UNIT/ML SOPN Inject 22 Units into the skin.      Allergies:   Celecoxib, Lipitor [atorvastatin calcium], Zetia [ezetimibe], Ropinirole hcl, Actos [pioglitazone hydrochloride], Crestor [rosuvastatin calcium], Doxycycline, Duloxetine, Fenofibrate, Fenofibrate, Imdur [isosorbide nitrate], Isosorbide, Metformin and related, Metoprolol succinate [metoprolol], Niaspan [niacin], Pravachol, Pravachol [pravastatin], Ranolazine er, Ropinirole, Rosuvastatin, Niaspan [niacin er], and Ranolazine   Social History   Socioeconomic History   Marital status: Married    Spouse name: Not on file   Number of children: Not on file   Years of education: Not on file   Highest education level: Not on file  Occupational History   Not on file  Tobacco Use   Smoking status: Never   Smokeless tobacco: Never  Substance and Sexual Activity   Alcohol use: No   Drug use: No   Sexual activity: Never  Other Topics Concern   Not on file  Social History Narrative   Not on file   Social Determinants of Health   Financial Resource Strain: Not on file  Food Insecurity: Not on file  Transportation Needs: Not on file  Physical Activity: Not on file  Stress: Not on file  Social Connections: Not on file     Family History: The patient's family history includes Dementia in her father; Heart attack in her brother and father; Heart disease in her brother and father; Hypertension in her mother; Stroke in her mother.  ROS:   Please see the  history of present illness.     EKGs/Labs/Other Studies Reviewed:    EKG:  The ekg ordered today demonstrates normal sinus rhythm, incomplete right bundle branch block, heart rate 75.  Recent Labs: No results found for requested labs within last 365 days.   Recent Lipid Panel Lab Results  Component Value Date/Time   CHOL 119 04/19/2020 11:23 AM   TRIG 98 04/19/2020 11:23 AM   HDL 70 04/19/2020 11:23 AM   LDLCALC 31 04/19/2020 11:23 AM   LDLDIRECT 148.7 09/14/2012 08:57 AM    Physical Exam:    VS:  BP 112/78 (BP Location: Left Arm, Patient Position: Sitting, Cuff Size: Large)   Pulse 75   Ht 5' 3.5" (1.613 m)   Wt 213 lb 9.6 oz (96.9 kg)   SpO2 97%   BMI  37.24 kg/m    No data found.       Wt Readings from Last 3 Encounters:  12/27/21 213 lb 9.6 oz (96.9 kg)  08/10/21 215 lb 9.6 oz (97.8 kg)  06/08/21 210 lb 9.6 oz (95.5 kg)     GEN:  Well nourished, well developed in no acute distress, obese HEENT: Normal NECK: No JVD; No carotid bruits CARDIAC: RRR, no murmurs, rubs, gallops RESPIRATORY:  Clear to auscultation without rales, wheezing or rhonchi  ABDOMEN: Soft, non-tender, non-distended MUSCULOSKELETAL: 2+ LE edema to knees bilaterally SKIN: Warm and dry NEUROLOGIC:  Alert and oriented PSYCHIATRIC:  Normal affect     ASSESSMENT AND PLAN   Preop clearance [id number]} Whitney Newton's perioperative risk of a major cardiac event is 11% according to the Revised Cardiac Risk Index (RCRI).  Therefore, she is at high risk for perioperative complications.   Her functional capacity is fair at 3.97 METs according to the Duke Activity Status Index (DASI). Recommendations: The patient is at high risk for perioperative cardiac complications and is at a low functional capacity.  However, further testing will not change how her cardiac status is managed.   Antiplatelet and/or Anticoagulation Recommendations: Clopidogrel (Plavix) can be held for 5-7 days prior to her surgery and  resumed as soon as possible post op. Given the patient's hx of CVA, we prefer to continue ASA throughout the perioperative period. However, if doing so will significantly increase morbidity or mortality, may hold for 5-7 days and restart as soon after when deemed safe per surgeon.  Chronic diastolic heart failure, hypervolemic -Symptoms of lower extremity edema and weight gain -Her dyspnea on exertion is likely more related to obesity and deconditioning/sedentary lifestyle -Recommend starting torsemide 20 mg daily. -She may have underlying lymphedema/venous insufficiency/obesity contributing to edema but we will see what a more effective diuretic can do for her edema. -Recommend salt restriction -Increase mobility as tolerated. -Check BMET in 2 weeks  Coronary artery disease, stable -mild to moderate nonobstructive disease on left heart cath 2019.   -No significant angina -Continue DAPT for history of stroke -Continue Repatha for lipid therapy  History of stroke -Continue aspirin and Plavix  Hypertension, well controlled today -Continue losartan.   -Patient stopped Ziac as she is concerned about HCTZ increasing her urinary incontinence. -Goal BP is <130/80.  Recommend DASH diet (high in vegetables, fruits, low-fat dairy products, whole grains, poultry, fish, and nuts and low in sweets, sugar-sweetened beverages, and red meats), salt restriction and increase physical activity.  Hyperlipidemia with goal LDL less than 70 -LDL 31 in November 2021. -She is on Repatha.  She states she takes WelChol if she is eating a fatty/high cholesterol meal. -Check fasting lipids. -Discussed cholesterol lowering diets - Mediterranean diet, DASH diet, vegetarian diet, low-carbohydrate diet and avoidance of trans fats.  Discussed healthier choice substitutes.  Nuts, high-fiber foods, and fiber supplements may also improve lipids.    Disposition - Follow-up on September 7 with Dr. Debara Pickett as  scheduled.   Medication Adjustments/Labs and Tests Ordered: Current medicines are reviewed at length with the patient today.  Concerns regarding medicines are outlined above.  Orders Placed This Encounter  Procedures   Basic metabolic panel   Lipid panel   EKG 12-Lead   ECHOCARDIOGRAM COMPLETE   Meds ordered this encounter  Medications   torsemide (DEMADEX) 20 MG tablet    Sig: Take 1 tablet (20 mg total) by mouth once for 1 dose.    Dispense:  30 tablet  Refill:  3    Patient Instructions  Medication Instructions:  Start Torsemide 20 mg ( Take 1 Tablet Daily). *If you need a refill on your cardiac medications before your next appointment, please call your pharmacy*   Lab Work: BMEt, Lipid Panel. In 2 Weeks If you have labs (blood work) drawn today and your tests are completely normal, you will receive your results only by: [place] (if you have MyChart) OR A paper copy in the mail If you have any lab test that is abnormal or we need to change your treatment, we will call you to review the results.   Testing/Procedures: [address], [place] has requested that you have an echocardiogram. Echocardiography is a painless test that uses sound waves to create images of your heart. It provides your doctor with information about the size and shape of your heart and how well your heart's chambers and valves are working. This procedure takes approximately one hour. There are no restrictions for this procedure.    Follow-Up: At [facility], you and your health needs are our priority.  As part of our continuing mission to provide you with exceptional heart care, we have created designated Provider Care Teams.  These Care Teams include your primary Cardiologist (physician) and Advanced Practice Providers (APPs -  Physician Assistants and Nurse Practitioners) who all work together to provide you with the care you need, when you need it.  We  recommend signing up for the patient portal called "MyChart".  Sign up information is provided on this After Visit Summary.  MyChart is used to connect with patients for Virtual Visits (Telemedicine).  Patients are able to view lab/test results, encounter notes, upcoming appointments, etc.  Non-urgent messages can be sent to your provider as well.   To learn more about what you can do with MyChart, go to [email or web address].    Your next appointment:   Keep Scheduled appointment  The format for your next appointment:   In Person  Provider:    Pixie Casino, MD  If primary card or EP is not listed click here to update    :1}    Other Instructions Bring Blood Pressure and Weight Log to Follow up.  Important Information About Sugar         Signed, Gaston Islam  12/27/2021 12:49 PM    [facility]

## 2021-12-27 ENCOUNTER — Encounter: Payer: Self-pay | Admitting: Physician Assistant

## 2021-12-27 ENCOUNTER — Ambulatory Visit (INDEPENDENT_AMBULATORY_CARE_PROVIDER_SITE_OTHER): Payer: Medicare Other | Admitting: Physician Assistant

## 2021-12-27 VITALS — BP 112/78 | HR 75 | Ht 63.5 in | Wt 213.6 lb

## 2021-12-27 DIAGNOSIS — I2510 Atherosclerotic heart disease of native coronary artery without angina pectoris: Secondary | ICD-10-CM

## 2021-12-27 DIAGNOSIS — Z01810 Encounter for preprocedural cardiovascular examination: Secondary | ICD-10-CM | POA: Diagnosis not present

## 2021-12-27 DIAGNOSIS — I10 Essential (primary) hypertension: Secondary | ICD-10-CM

## 2021-12-27 DIAGNOSIS — I5032 Chronic diastolic (congestive) heart failure: Secondary | ICD-10-CM | POA: Diagnosis not present

## 2021-12-27 MED ORDER — TORSEMIDE 20 MG PO TABS: 20.0000 mg | ORAL_TABLET | Freq: Once | ORAL | 3 refills | Status: DC

## 2021-12-27 NOTE — Patient Instructions (Signed)
Medication Instructions:  Start Torsemide 20 mg ( Take 1 Tablet Daily). *If you need a refill on your cardiac medications before your next appointment, please call your pharmacy*   Lab Work: BMEt, Lipid Panel. In 2 Weeks If you have labs (blood work) drawn today and your tests are completely normal, you will receive your results only by: [place] (if you have MyChart) OR A paper copy in the mail If you have any lab test that is abnormal or we need to change your treatment, we will call you to review the results.   Testing/Procedures: [address], [place] has requested that you have an echocardiogram. Echocardiography is a painless test that uses sound waves to create images of your heart. It provides your doctor with information about the size and shape of your heart and how well your heart's chambers and valves are working. This procedure takes approximately one hour. There are no restrictions for this procedure.    Follow-Up: At [facility], you and your health needs are our priority.  As part of our continuing mission to provide you with exceptional heart care, we have created designated Provider Care Teams.  These Care Teams include your primary Cardiologist (physician) and Advanced Practice Providers (APPs -  Physician Assistants and Nurse Practitioners) who all work together to provide you with the care you need, when you need it.  We recommend signing up for the patient portal called "MyChart".  Sign up information is provided on this After Visit Summary.  MyChart is used to connect with patients for Virtual Visits (Telemedicine).  Patients are able to view lab/test results, encounter notes, upcoming appointments, etc.  Non-urgent messages can be sent to your provider as well.   To learn more about what you can do with MyChart, go to [email or web address].    Your next appointment:   Keep Scheduled appointment  The format for your next  appointment:   In Person  Provider:    Pixie Casino, MD  If primary card or EP is not listed click here to update    :1}    Other Instructions Bring Blood Pressure and Weight Log to Follow up.  Important Information About Sugar

## 2022-01-02 ENCOUNTER — Other Ambulatory Visit: Payer: Self-pay | Admitting: Internal Medicine

## 2022-01-08 DIAGNOSIS — D3A8 Other benign neuroendocrine tumors: Secondary | ICD-10-CM | POA: Diagnosis not present

## 2022-01-08 DIAGNOSIS — D1803 Hemangioma of intra-abdominal structures: Secondary | ICD-10-CM | POA: Diagnosis not present

## 2022-01-09 ENCOUNTER — Ambulatory Visit (HOSPITAL_COMMUNITY): Payer: Medicare Other | Attending: Physician Assistant

## 2022-01-09 DIAGNOSIS — I5032 Chronic diastolic (congestive) heart failure: Secondary | ICD-10-CM

## 2022-01-09 DIAGNOSIS — I10 Essential (primary) hypertension: Secondary | ICD-10-CM | POA: Diagnosis not present

## 2022-01-09 DIAGNOSIS — I2510 Atherosclerotic heart disease of native coronary artery without angina pectoris: Secondary | ICD-10-CM

## 2022-01-09 LAB — ECHOCARDIOGRAM COMPLETE
Area-P 1/2: 3.6 cm2
S' Lateral: 2.4 cm

## 2022-01-10 DIAGNOSIS — I2510 Atherosclerotic heart disease of native coronary artery without angina pectoris: Secondary | ICD-10-CM | POA: Diagnosis not present

## 2022-01-10 DIAGNOSIS — I5032 Chronic diastolic (congestive) heart failure: Secondary | ICD-10-CM | POA: Diagnosis not present

## 2022-01-10 DIAGNOSIS — I10 Essential (primary) hypertension: Secondary | ICD-10-CM | POA: Diagnosis not present

## 2022-01-10 LAB — LIPID PANEL
Chol/HDL Ratio: 1.8 ratio (ref 0.0–4.4)
Cholesterol, Total: 129 mg/dL (ref 100–199)
HDL: 72 mg/dL (ref >39)
LDL Chol Calc (NIH): 39 mg/dL (ref 0–99)
Triglycerides: 97 mg/dL (ref 0–149)
VLDL Cholesterol Cal: 18 mg/dL (ref 5–40)

## 2022-01-10 LAB — BASIC METABOLIC PANEL
BUN/Creatinine Ratio: 29 — ABNORMAL HIGH (ref 12–28)
BUN: 25 mg/dL (ref 8–27)
CO2: 26 mmol/L (ref 20–29)
Calcium: 9.9 mg/dL (ref 8.7–10.3)
Chloride: 102 mmol/L (ref 96–106)
Creatinine, Ser: 0.87 mg/dL (ref 0.57–1.00)
Glucose: 312 mg/dL — ABNORMAL HIGH (ref 70–99)
Potassium: 5.1 mmol/L (ref 3.5–5.2)
Sodium: 140 mmol/L (ref 134–144)
eGFR: 67 mL/min/{1.73_m2} (ref >59)

## 2022-01-11 ENCOUNTER — Telehealth: Payer: Self-pay

## 2022-01-11 DIAGNOSIS — G5602 Carpal tunnel syndrome, left upper limb: Secondary | ICD-10-CM | POA: Diagnosis not present

## 2022-01-11 NOTE — Telephone Encounter (Addendum)
Called patient regarding resutls patient had understanding of results.----- Message from Warren Lacy, PA-C sent at 01/10/2022  3:05 PM EDT ----- Normal heart squeeze, no significant valve disease.   No structural issue to explain shortness of breath.   Recommend weight loss and increasing activity as able.    If you still feel short of breath after eating a large meal and get choked pretty easily, consider talking to your PCP about a GI referral.

## 2022-01-22 ENCOUNTER — Telehealth: Payer: Self-pay

## 2022-01-22 NOTE — Telephone Encounter (Addendum)
Called patient regarding results. Patient had understanding of results.----- Message from Warren Lacy, PA-C sent at 01/16/2022  3:56 PM EDT ----- Spoke to pt on the phone.    She mentions inconsistent use of torsemide.  Potassium higher end of normal.  Recommend taking potassium only when takes torsemide. She does think torsemide is working better than lasix.  Feels less urinary incontinence with torsemide. Mentions lowest weight = 203 lbs.    Cholesterol at goal.  Continue Repatha.  Can stop as needed Welchol.

## 2022-01-25 DIAGNOSIS — M25532 Pain in left wrist: Secondary | ICD-10-CM | POA: Diagnosis not present

## 2022-01-28 DIAGNOSIS — Z794 Long term (current) use of insulin: Secondary | ICD-10-CM | POA: Diagnosis not present

## 2022-01-28 DIAGNOSIS — E039 Hypothyroidism, unspecified: Secondary | ICD-10-CM | POA: Diagnosis not present

## 2022-01-28 DIAGNOSIS — E11319 Type 2 diabetes mellitus with unspecified diabetic retinopathy without macular edema: Secondary | ICD-10-CM | POA: Diagnosis not present

## 2022-01-28 DIAGNOSIS — E1140 Type 2 diabetes mellitus with diabetic neuropathy, unspecified: Secondary | ICD-10-CM | POA: Diagnosis not present

## 2022-01-28 DIAGNOSIS — Z79890 Hormone replacement therapy (postmenopausal): Secondary | ICD-10-CM | POA: Diagnosis not present

## 2022-01-28 DIAGNOSIS — E1165 Type 2 diabetes mellitus with hyperglycemia: Secondary | ICD-10-CM | POA: Diagnosis not present

## 2022-01-30 DIAGNOSIS — G4733 Obstructive sleep apnea (adult) (pediatric): Secondary | ICD-10-CM | POA: Diagnosis not present

## 2022-01-30 DIAGNOSIS — Z9889 Other specified postprocedural states: Secondary | ICD-10-CM | POA: Diagnosis not present

## 2022-01-30 DIAGNOSIS — Z79899 Other long term (current) drug therapy: Secondary | ICD-10-CM | POA: Diagnosis not present

## 2022-01-30 DIAGNOSIS — Z9989 Dependence on other enabling machines and devices: Secondary | ICD-10-CM | POA: Diagnosis not present

## 2022-01-30 DIAGNOSIS — M5104 Intervertebral disc disorders with myelopathy, thoracic region: Secondary | ICD-10-CM | POA: Diagnosis not present

## 2022-02-14 ENCOUNTER — Ambulatory Visit: Payer: Medicare Other | Attending: Internal Medicine | Admitting: Internal Medicine

## 2022-02-14 ENCOUNTER — Encounter: Payer: Self-pay | Admitting: Internal Medicine

## 2022-02-14 VITALS — BP 120/80 | HR 100 | Wt 212.0 lb

## 2022-02-14 DIAGNOSIS — I5033 Acute on chronic diastolic (congestive) heart failure: Secondary | ICD-10-CM | POA: Diagnosis not present

## 2022-02-14 DIAGNOSIS — Z789 Other specified health status: Secondary | ICD-10-CM | POA: Insufficient documentation

## 2022-02-14 DIAGNOSIS — I2510 Atherosclerotic heart disease of native coronary artery without angina pectoris: Secondary | ICD-10-CM | POA: Insufficient documentation

## 2022-02-14 DIAGNOSIS — E785 Hyperlipidemia, unspecified: Secondary | ICD-10-CM | POA: Insufficient documentation

## 2022-02-14 MED ORDER — TORSEMIDE 20 MG PO TABS: 20.0000 mg | ORAL_TABLET | Freq: Every day | ORAL | 3 refills | Status: DC

## 2022-02-14 MED ORDER — POTASSIUM CHLORIDE CRYS ER 20 MEQ PO TBCR: 20.0000 meq | EXTENDED_RELEASE_TABLET | Freq: Every day | ORAL | 3 refills | Status: DC

## 2022-02-14 NOTE — Progress Notes (Signed)
Cardiology Office Note   Date:  02/14/2022   ID:  Whitney Newton, DOB 07/08/1940, MRN [id number]  PCP:  Aurea Graff.Marlou Sa, MD   CC: Follow-up edema  History of Present Illness: Whitney Newton is a 81 y.o. female who presents for  Four-month follow-up visit  This pleasant 81 year old woman is seen for a scheduled followup visit. The patient has a history of having had a stroke in early December 2014. She was at her diabetes clinic at [facility] and they diagnosed her and sent her straight to neurology where she was hospitalized for 3 days. The MRI showed a new stroke on the right side. She had carotid Dopplers which did not show any obstructive lesions but she did have some plaque and they put her back on aspirin and continued her Plavix. The patient had a echocardiogram which was a bubble study and did not show any evidence of right-to-left shunt.  More recently, she has had further neurology workup at [facility] on 06/20/14.  She had a transcranial Doppler.  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  Previous carotid Dopplers had not shown any significant external cranial disease  She has a history of essential hypertension, diabetes mellitus, and dyslipidemia. She has had atypical chest pain. She is intolerant of statin drugs. We updated her lexiscan Myoview stress test on 03/17/12 and it was normal showing no evidence of ischemia and her ejection fraction was 78%. The patient has never had a cardiac catheterization. She has continued to have some shortness of breath. She had an echocardiogram in 04/07/12 which showed an ejection fraction of 55-65% with grade 1 diastolic dysfunction. There is trivial aortic insufficiency.   The patient has a history of essential hypertension as well as atypical chest pain. Her diabetes is now being followed at the  [facility] clinic at [facility].  Since we last saw her she had an ophthalmology evaluation on 07/27/14 by Dr. Kathrin Penner and no diabetic retinopathy was found.  Her last chest x-ray was on 12/21/12 elsewhere and showed a normal heart size and low lung volumes. She had a recent CT scan of the chest on 03/23/13 at [facility] which showed normal heart size and demonstrated atherosclerosis of the aorta.   The patient has a past history of dermatofibrosarcoma of the spine. She had surgery for this at [facility]. She also has a past history of angiolipoma of the abdominal wall.   Since last visit she has not been experiencing any new cardiac symptoms.  She does have sleep apnea.  She uses a CPAP machine.  She has had some low back pain and pain in her hip radiating to her knees.  She has seen Dr. Durward Fortes. She was admitted to the hospital on 01/18/15 for chest and abdominal pain.  She had a CTA of the chest on 01/18/15 which was negative for pulmonary embolus.  The following day she had a Myoview on 01/19/15 which showed no ischemia and her ejection fraction was 85%.  She had a lot of side effects from the [facility].  It was felt in retrospect that her presenting complaints were probably GI in origin rather than cardiac.  She does have a history of GERD and sees Dr. Oletta Lamas.  since last visit she has had no new cardiac symptoms.  She has not been having any recent chest  discomfort. Her weight is unchanged and her blood pressures been stable.  11/22/2015  Whitney Newton presents today to establish care with me. She is a previous patient of Dr. Warren Danes. She [place] was at the beach and developed some worsening shortness of breath and leg swelling. Her nephew was there who came down from [place] with a head cold and she seems to have symptoms now at this time. She was also seen at an urgent care there and placed on amoxicillin. She's taken that for a few days. She notes significant swelling  in her weight is much higher than it had been in the past. She endorses a high salt intake. Her last echocardiogram was in 2013 which showed an EF of 55-65% with grade 1 diastolic dysfunction. She had a stress test in 2016 for chest pain which showed an EF of 85%.  12/25/2015  I saw Whitney Newton that today in the office. She has been taking the Lasix 40 mg daily which was over a week and this resulted in significant diuresis. Her weight had reduced from 202 down to 189. She then had to stop it for a few days because of some other medical problems are currently weight is up to 194. Her BNP associate with this was very low at 8 and renal function appeared to be stable. She reports a 75% improvement in breathing. Her repeat echo shows that EF remains stable with grade 1 diastolic dysfunction.  03/13/2016  Whitney Newton reports she's had a marked improvement in her swelling and breathing with additional diuretics. Weight has been fairly stable. She reports one episode of chest pain which was responsive to nitroglycerin. She's also had some reflux symptoms and is currently on Zegerid which appears to be helping her symptoms. Is not clear whether these episodes of chest discomfort are related to reflux or coronary ischemia. She did have a negative Myoview stress test in August 2016.  08/29/2016  Whitney Newton was seen today in the office. Over the past several day she's had worsening shortness of breath and lower extremity swelling. Her weight is now up about 6 pounds from her previous office visit. She said over the auscultation is been taking Lasix more regularly. She's currently taking 40 mg daily but as previously taken that for 3 days and alternating with 20 mg. She notes some improvement today after a couple days of diuretics.  10/08/2016  Whitney Newton returns today for follow-up. She has diuresed several pounds with increased dose diuretics are per creatinine rose and therefore I decreased her diuretic back  to 40 mg daily. She says she feels like she is over dried out on that dose of diuretic. She occasionally gets some cramping. She's also had some labile blood sugars which she attributes to the diuretics. In addition she had a recent bowel obstruction which may been related to diuresis.  03/06/2017  Whitney Newton was seen today in follow-up. She recently got back from a trip to assess catch one with her husband. She said they were traveling for about 16 days and she did not take her Lasix during that period of time. Her weight is up about 6 pounds that she reports lower extremity swelling. She has a sliding scale Lasix to take but has not been compliant with that.  08/20/2017  Whitney Newton returns today for follow-up.  She has been having a lot of symptoms recently.  She is complaining of pain across her abdomen.  This is been further assessed and there is  a suggestion it could be related to back surgery.  She was also found to have mass on the liver and pancreas, thought to be hemangioma.  She is scheduled to have a CT scan of her abdomen next week.  She continues to gain weight.  Her weight is up 6 pounds since I saw her in September.  Which was 203 at the time.  Her weight had been as low as 195 last year.  She does report lower extremity edema and some worsening shortness of breath however she relates the shortness of breath more to her pain.  She had been on higher dose Lasix in the past but was noted to have some elevation in creatinine.  We did reduce the dose and she has been compliant taking 20 mg daily.  02/05/2018  Whitney Newton returns for follow-up today.  Again she has numerous complaints however she is recently been having worsening chest discomfort.  She describes it as a squeezing or pressure in the center of her chest sometimes radiates up her neck.  May be associated with worsening shortness of breath.  Despite this she is taken extra Lasix and her weight is come down to her baseline of 203.  She has not had  symptomatic improvement.  She was scheduled to see me in a few weeks but got an earlier appointment because of her chest discomfort.  Sometimes her symptoms are associated with exertion and relieved by rest at other times can be present at rest or when awakening.  02/23/2018  Whitney Newton was seen today in follow-up of heart cath.  She underwent left heart catheterization on 02/11/2018.  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  LVEF was 65% with normal LVEDP 18 mmHg.  Post cath she still reports shortness of breath with certain activities.  She did have recent lipid testing which was as follows: LDL 148, Total chol 225, HDL 54, Non-HDL 171, TG 178.  Her goal LDL is less than 70 and she has not yet reach that target.  Unfortunately she has been statin intolerant and is a good candidate for PCSK9 inhibitor.  05/22/2018  Whitney Newton is seen today in follow-up.  She was started on Repatha which she seems to be tolerating well.  She has been on the medicine now for about 3 months and is getting it from free with support from [place].  Recently she had transcranial Dopplers at [facility] which actually shows some mild improvement in her intracranial atherosclerosis.  This could be related to her aggressive lipid therapy.  She was supposed to have repeat labs prior to this visit however it was not performed.  We will need to order a repeat lipid profile in order to reassess her therapy so far.  08/10/2018  Whitney Newton returns today for follow-up.  She has been doing well with Repatha although has had several injections which have possibly failed.  She seems to be injecting on the anterior thigh and has had some bleeding and bruising.  She also has trouble depressing the injector and noted some leakage of the medication.  I did  perform some extra training with her today and helped describe to her a better way to hold the pen as well as a better injection site which I think should improve her drug delivery.  That being said in  December, her total cholesterol come down significantly to 104, with HDL 62, LDL 26 and triglycerides of 81.  Hemoglobin A1c remains persistently elevated at 8.2.  04/16/2019  Whitney Newton is seen today in follow-up.  She thinks she might of had another stroke in May.  She had some word finding difficulties.  She is being followed by neurology at [facility] and had a recent transcranial Doppler and may have an MRI because she is been having some numbness and tingling in her arms.  She seems to be tolerating Repatha.  Her labs are markedly improved.  Total cholesterol now 104, triglycerides 81, HDL 62 and LDL 26.  No significant side effects with it.  10/22/2019  Whitney Newton returns today for follow-up with her husband.  She denies any new stroke symptoms.  She is responded well to Repatha and although her LDL was down as low as 14 it is now come up to 49 about 6 months ago.  Again this is well controlled however the increase is due to dietary changes.  She says she is eating more pizza, bacon and eggs and other saturated fats.  In addition she has gained weight.  There is also lower extremity edema.  She reports lack of compliance with the Lasix.  She says she may go several days without taking any and then has to take several days worth.  She has had issues with incontinence and a spastic bladder.  04/19/2020  Whitney Newton returns today with her husband.  Overall she reports some worsening shortness of breath.  She has had lower extremity edema and weight gain despite taking her Lasix more regularly.  She takes 40 mg daily.  Weight is up again another few pounds.  She is not had a repeat echo since 2017 which showed normal LVEF at the time but diastolic dysfunction.  01/24/2021  Whitney Newton returns today in follow-up.  She has a number  of different complaints today.  She is really struggling with fatigue and some daytime somnolence.  She says she has trouble sleeping at night.  I think she is struggling with neuropathy.  She has lower extremity edema but recently we had increased her diuretics.  Her BNP was low and I recommended instead of 40 twice daily to go to 60 mg Lasix daily.  Potassium and electrolytes are normal however she has had leg cramps and restlessness mostly at night.  06/08/2021  Whitney Newton is seen today in follow-up.  She was just hospitalized at [facility] for syncopal episode.  This occurred in her home.  She apparently became somewhat unresponsive and her husband called 911.  She then syncopized and was awakened by EMS.  She was taken to [facility].  Initial work-up was unremarkable.  She apparently had an elevated D-dimer but testing was negative for PE.  Repeat echo showed normal LVEF 60 to 65%.  She was noted to be dehydrated and taken off of her Ziac.  She had previously discontinued Lasix which I placed her on before.  She says that her swelling is actually been a little better and she is urinating less.  They increased her losartan from 25 to 50 mg daily and noted she was B12 deficient and had given her some B12 injections.  She says her energy level has improved somewhat.  EKG here shows normal sinus rhythm at 94.  She was placed on a monitor which she recently returned but those results were not available and would be interpreted by the staff at [facility].  08/10/2021  Whitney Newton returns today for follow-up of her monitor and worsening edema.  Blood pressure is also been poorly controlled recently.  She is been taken off of Ziac.  She was on metoprolol which I started but she said she could not tolerate it due to joint pains.  She also has had worsening lower extremity edema and weight gain up another 5 pounds since I saw her in December.  Overall she does not feel well.  Unfortunately she has numerous medication intolerances and it  has been difficult to find a regimen that she could tolerate.  02/14/2022  Whitney Newton is seen today in follow-up.  She was recently seen by Caron Presume, PA-C and noted to be volume overloaded.  She was not tolerant of furosemide however she was trialed on torsemide.  This seemed to work a little better for her and she had actually lost quite a bit of weight down to about 203 pounds however her weight now is back up to 212 pounds.  She also has regained a lot of fluid.  It is likely because she has not been taking the torsemide which she thought she may only have to take as needed.  For some reason her prescription torsemide was only written for 1 dose.  She did have repeat labs which showed essentially no change in renal function.  Past Medical History:  Diagnosis Date   Acute diastolic (congestive) heart failure ([place]) 12/25/2015   Back pain    prior back surgery in 2009 - slow to recover   Cancer ([place])    Diabetes ([place])    Diverticulitis    Gastritis    Hemangioma of liver    managed conservatively; followed at [place] (hyperlipidemia)    statin intolerant   Hypertension    Normal cardiac stress test 2011   Obesity    Thyroid disease    hypothyroidism    Past Surgical History:  Procedure Laterality Date   ABDOMINAL HYSTERECTOMY     APPENDECTOMY     LEFT HEART CATH AND CORONARY ANGIOGRAPHY N/A 02/11/2018   Procedure: LEFT HEART CATH AND CORONARY ANGIOGRAPHY;  Surgeon: Troy Sine, MD;  Location: [place] [facility];  Service: Cardiovascular;  Laterality: N/A;   NASAL RECONSTRUCTION       Current Outpatient Medications  Medication Sig Dispense Refill   aspirin 81 MG tablet Take 81 mg by mouth daily.     clopidogrel (PLAVIX) 75 MG tablet Take 1 tablet (75 mg total) by mouth daily. 90 tablet 3   colesevelam (WELCHOL) 625 MG tablet Take 3 tablets (1,875 mg total) by mouth 2 (two) times daily as needed (cholesterol). 180 tablet 6   diclofenac sodium (VOLTAREN) 1 % GEL Apply 1  application topically at bedtime as needed (for pain).     Evolocumab 140 MG/ML SOAJ Inject 1 mL into the skin every 14 (fourteen) days. 2 mL 11   famotidine (PEPCID) 10 MG tablet Take 10 mg by mouth daily in the afternoon.     gabapentin (NEURONTIN) 100 MG capsule Take 3 capsules ('300mg'$ ) by mouth at night and take additional 1-3 capsules daily as needed for breakthrough. 180 capsule 5   Glucagon 0.5 MG/0.1ML SOAJ      HUMALOG KWIKPEN 100 UNIT/ML KiwkPen Inject 14 Units into the skin See admin instructions. Inject 11 units SQ at breakfast, inject 14 units SQ at lunch and inject 16 units SQ at supper. Plus sliding scale 1 unit for every 25 BS is > 120  Hyoscyamine Sulfate 0.375 MG TBCR Take 1 tablet every 12 hours by oral route.     levothyroxine (SYNTHROID, LEVOTHROID) 88 MCG tablet Take 88 mcg by mouth daily.      Liniments (SALONPAS EX) Apply 1 patch topically daily as needed (for pain).     losartan (COZAAR) 25 MG tablet Take 2 tablets (50 mg total) by mouth daily. 90 tablet 3   meclizine (ANTIVERT) 12.5 MG tablet Take 1 tablet (12.5 mg total) by mouth 3 (three) times daily as needed for dizziness. 30 tablet 0   nitroGLYCERIN (NITROSTAT) 0.4 MG SL tablet Place 1 tablet (0.4 mg total) under the tongue every 5 (five) minutes as needed for chest pain. 25 tablet 2   ondansetron (ZOFRAN ODT) 4 MG disintegrating tablet Take 1 tablet (4 mg total) by mouth every 8 (eight) hours as needed for nausea or vomiting. 20 tablet 0   TOUJEO SOLOSTAR 300 UNIT/ML SOPN Inject 22 Units into the skin.      potassium chloride SA (KLOR-CON M) 20 MEQ tablet Take 1 tablet (20 mEq total) by mouth daily in the afternoon. 90 tablet 3   torsemide (DEMADEX) 20 MG tablet Take 1 tablet (20 mg total) by mouth daily. 90 tablet 3   No current facility-administered medications for this visit.    Allergies:   Celecoxib, Lipitor [atorvastatin calcium], Zetia [ezetimibe], Ropinirole hcl, Actos [pioglitazone hydrochloride],  Crestor [rosuvastatin calcium], Doxycycline, Duloxetine, Fenofibrate, Fenofibrate, Imdur [isosorbide nitrate], Isosorbide, Metformin and related, Metoprolol succinate [metoprolol], Niaspan [niacin], Pravachol, Pravachol [pravastatin], Ranolazine er, Ropinirole, Rosuvastatin, Niaspan [niacin er], and Ranolazine    Social History:  The patient  reports that she has never smoked. She has never used smokeless tobacco. She reports that she does not drink alcohol and does not use drugs.   Family History:  The patient's family history includes Dementia in her father; Heart attack in her brother and father; Heart disease in her brother and father; Hypertension in her mother; Stroke in her mother.    ROS:   Pertinent items noted in HPI and remainder of comprehensive ROS otherwise negative.  PHYSICAL EXAM: VS:  BP 120/80 (BP Location: Left Arm, Patient Position: Sitting, Cuff Size: Large)   Pulse 100   Wt 212 lb (96.2 kg)   SpO2 98%   BMI 36.97 kg/m  , BMI Body mass index is 36.97 kg/m. General appearance: alert, no distress and moderately obese Neck: no carotid bruit and no JVD Lungs: diminished breath sounds bilaterally Heart: regular rate and rhythm Abdomen: soft, non-tender; bowel sounds normal; no masses,  no organomegaly Extremities: edema 2+ pedal edema Pulses: 2+ and symmetric Skin: Skin color, texture, turgor normal. No rashes or lesions Neurologic: Grossly normal Psych: Mildly anxious  EKG:   Sinus tachycardia at 101, incomplete right bundle branch block--personally reviewed  Recent Labs: 01/10/2022: BUN 25; Creatinine, Ser 0.87; Potassium 5.1; Sodium 140    Lipid Panel    Component Value Date/Time   CHOL 129 01/10/2022 0835   TRIG 97 01/10/2022 0835   HDL 72 01/10/2022 0835   CHOLHDL 1.8 01/10/2022 0835   CHOLHDL 3.8 11/15/2015 0954   VLDL 20 11/15/2015 0954   LDLCALC 39 01/10/2022 0835   LDLDIRECT 148.7 09/14/2012 0857      Wt Readings from Last 3 Encounters:   02/14/22 212 lb (96.2 kg)  12/27/21 213 lb 9.6 oz (96.9 kg)  08/10/21 215 lb 9.6 oz (97.8 kg)     ASSESSMENT AND PLAN:  Syncope Chronic diastolic congestive heart failure  Hypertensive heart disease without heart failure Diabetes mellitus - on insulin Atypical chest pain with normal Myoview stress test in August 2016.  No ischemia.  Ejection fraction 85%. old CVA followed at [facility] History of dermatofibro- sarcoma of the spine. Dyslipidemia Osteoarthritis OSA on CPAP [facility]) Hypothyroidism followed at [facility]. GERD, followed by Dr. Oletta Lamas  RBBB Peripheral neuropathy Statin intolerance - on Repatha   PLAN:  Whitney Newton has had some acute on chronic diastolic congestive heart failure with lower extremity edema.  She was intolerant of furosemide but seem to do better with torsemide.  Her renal function has been stable.  She had lost weight but now is gained that back and I suspect this is related to edema.  She has 2+ lower extremity edema today.  Will reinstitute her torsemide 20 mg on a daily basis and plan repeat metabolic profile and BNP in about 2 weeks.  On a more positive note, her cholesterol is significantly low now on Repatha.  LDL was 31.  We will continue with this therapy.  It seems well-tolerated.  Follow-up in 6 months or sooner as necessary.  Pixie Casino, MD, [facility], [place] Director of the Advanced Lipid Disorders &  Cardiovascular Risk Reduction Clinic Diplomate of the American Board of Clinical Lipidology Attending Cardiologist  Direct Dial: [phone]  Fax: [phone]  Website:  [email or web address]   02/14/2022 1:19 PM

## 2022-02-14 NOTE — Patient Instructions (Signed)
Medication Instructions:  TAKE torsemide '20mg'$  daily along with potassium  CONTINUE all other current medications  *If you need a refill on your cardiac medications before your next appointment, please call your pharmacy*   Lab Work: Non-Fasting BMET end of next week   If you have labs (blood work) drawn today and your tests are completely normal, you will receive your results only by: [place] (if you have MyChart) OR A paper copy in the mail If you have any lab test that is abnormal or we need to change your treatment, we will call you to review the results.   Testing/Procedures: NONE   Follow-Up: At [facility], you and your health needs are our priority.  As part of our continuing mission to provide you with exceptional heart care, we have created designated Provider Care Teams.  These Care Teams include your primary Cardiologist (physician) and Advanced Practice Providers (APPs -  Physician Assistants and Nurse Practitioners) who all work together to provide you with the care you need, when you need it.  We recommend signing up for the patient portal called "MyChart".  Sign up information is provided on this After Visit Summary.  MyChart is used to connect with patients for Virtual Visits (Telemedicine).  Patients are able to view lab/test results, encounter notes, upcoming appointments, etc.  Non-urgent messages can be sent to your provider as well.   To learn more about what you can do with MyChart, go to [email or web address].    Your next appointment:   6 month(s)  The format for your next appointment:   In Person  Provider:   Pixie Casino, MD

## 2022-02-21 DIAGNOSIS — I5032 Chronic diastolic (congestive) heart failure: Secondary | ICD-10-CM | POA: Diagnosis not present

## 2022-02-22 LAB — BASIC METABOLIC PANEL
BUN/Creatinine Ratio: 29 — ABNORMAL HIGH (ref 12–28)
BUN: 26 mg/dL (ref 8–27)
CO2: 27 mmol/L (ref 20–29)
Calcium: 9.7 mg/dL (ref 8.7–10.3)
Chloride: 102 mmol/L (ref 96–106)
Creatinine, Ser: 0.91 mg/dL (ref 0.57–1.00)
Glucose: 126 mg/dL — ABNORMAL HIGH (ref 70–99)
Potassium: 4.6 mmol/L (ref 3.5–5.2)
Sodium: 143 mmol/L (ref 134–144)
eGFR: 63 mL/min/{1.73_m2} (ref >59)

## 2022-02-22 LAB — BRAIN NATRIURETIC PEPTIDE: BNP: 17.9 pg/mL (ref 0.0–100.0)

## 2022-03-28 ENCOUNTER — Encounter: Payer: Self-pay | Admitting: Internal Medicine

## 2022-04-09 DIAGNOSIS — I10 Essential (primary) hypertension: Secondary | ICD-10-CM | POA: Diagnosis not present

## 2022-04-09 DIAGNOSIS — E7800 Pure hypercholesterolemia, unspecified: Secondary | ICD-10-CM | POA: Diagnosis not present

## 2022-04-09 DIAGNOSIS — R600 Localized edema: Secondary | ICD-10-CM | POA: Diagnosis not present

## 2022-04-09 DIAGNOSIS — K869 Disease of pancreas, unspecified: Secondary | ICD-10-CM | POA: Diagnosis not present

## 2022-04-09 DIAGNOSIS — E119 Type 2 diabetes mellitus without complications: Secondary | ICD-10-CM | POA: Diagnosis not present

## 2022-04-09 DIAGNOSIS — Z23 Encounter for immunization: Secondary | ICD-10-CM | POA: Diagnosis not present

## 2022-04-09 DIAGNOSIS — Z0000 Encounter for general adult medical examination without abnormal findings: Secondary | ICD-10-CM | POA: Diagnosis not present

## 2022-04-09 DIAGNOSIS — K219 Gastro-esophageal reflux disease without esophagitis: Secondary | ICD-10-CM | POA: Diagnosis not present

## 2022-04-09 DIAGNOSIS — Z86010 Personal history of colonic polyps: Secondary | ICD-10-CM | POA: Diagnosis not present

## 2022-04-09 DIAGNOSIS — E538 Deficiency of other specified B group vitamins: Secondary | ICD-10-CM | POA: Diagnosis not present

## 2022-04-09 DIAGNOSIS — E039 Hypothyroidism, unspecified: Secondary | ICD-10-CM | POA: Diagnosis not present

## 2022-04-09 DIAGNOSIS — I679 Cerebrovascular disease, unspecified: Secondary | ICD-10-CM | POA: Diagnosis not present

## 2022-04-29 DIAGNOSIS — Z79899 Other long term (current) drug therapy: Secondary | ICD-10-CM | POA: Diagnosis not present

## 2022-04-29 DIAGNOSIS — E039 Hypothyroidism, unspecified: Secondary | ICD-10-CM | POA: Diagnosis not present

## 2022-04-29 DIAGNOSIS — Z794 Long term (current) use of insulin: Secondary | ICD-10-CM | POA: Diagnosis not present

## 2022-04-29 DIAGNOSIS — E119 Type 2 diabetes mellitus without complications: Secondary | ICD-10-CM | POA: Diagnosis not present

## 2022-04-29 DIAGNOSIS — E1165 Type 2 diabetes mellitus with hyperglycemia: Secondary | ICD-10-CM | POA: Diagnosis not present

## 2022-05-03 DIAGNOSIS — L03116 Cellulitis of left lower limb: Secondary | ICD-10-CM | POA: Diagnosis not present

## 2022-05-05 DIAGNOSIS — Z23 Encounter for immunization: Secondary | ICD-10-CM | POA: Diagnosis not present

## 2022-05-20 ENCOUNTER — Ambulatory Visit (HOSPITAL_COMMUNITY)
Admission: RE | Admit: 2022-05-20 | Discharge: 2022-05-20 | Disposition: A | Discharge status: Home / Self Care | Payer: Medicare Other | Source: Ambulatory Visit | Attending: Cardiology | Admitting: Cardiology

## 2022-05-20 ENCOUNTER — Other Ambulatory Visit (HOSPITAL_COMMUNITY): Payer: Self-pay | Admitting: Family Medicine

## 2022-05-20 DIAGNOSIS — M79605 Pain in left leg: Secondary | ICD-10-CM

## 2022-05-21 DIAGNOSIS — Z09 Encounter for follow-up examination after completed treatment for conditions other than malignant neoplasm: Secondary | ICD-10-CM | POA: Diagnosis not present

## 2022-05-21 DIAGNOSIS — Z888 Allergy status to other drugs, medicaments and biological substances status: Secondary | ICD-10-CM | POA: Diagnosis not present

## 2022-05-21 DIAGNOSIS — G4730 Sleep apnea, unspecified: Secondary | ICD-10-CM | POA: Diagnosis not present

## 2022-05-21 DIAGNOSIS — Z79899 Other long term (current) drug therapy: Secondary | ICD-10-CM | POA: Diagnosis not present

## 2022-05-21 DIAGNOSIS — Z7902 Long term (current) use of antithrombotics/antiplatelets: Secondary | ICD-10-CM | POA: Diagnosis not present

## 2022-05-21 DIAGNOSIS — Z9989 Dependence on other enabling machines and devices: Secondary | ICD-10-CM | POA: Diagnosis not present

## 2022-05-21 DIAGNOSIS — Z8673 Personal history of transient ischemic attack (TIA), and cerebral infarction without residual deficits: Secondary | ICD-10-CM | POA: Diagnosis not present

## 2022-05-21 DIAGNOSIS — M5414 Radiculopathy, thoracic region: Secondary | ICD-10-CM | POA: Diagnosis not present

## 2022-05-21 DIAGNOSIS — I771 Stricture of artery: Secondary | ICD-10-CM | POA: Diagnosis not present

## 2022-05-21 DIAGNOSIS — Z7982 Long term (current) use of aspirin: Secondary | ICD-10-CM | POA: Diagnosis not present

## 2022-05-21 DIAGNOSIS — I6601 Occlusion and stenosis of right middle cerebral artery: Secondary | ICD-10-CM | POA: Diagnosis not present

## 2022-05-21 DIAGNOSIS — Z881 Allergy status to other antibiotic agents status: Secondary | ICD-10-CM | POA: Diagnosis not present

## 2022-05-21 DIAGNOSIS — I10 Essential (primary) hypertension: Secondary | ICD-10-CM | POA: Diagnosis not present

## 2022-05-21 DIAGNOSIS — G5603 Carpal tunnel syndrome, bilateral upper limbs: Secondary | ICD-10-CM | POA: Diagnosis not present

## 2022-05-21 DIAGNOSIS — Z886 Allergy status to analgesic agent status: Secondary | ICD-10-CM | POA: Diagnosis not present

## 2022-05-21 DIAGNOSIS — Z9889 Other specified postprocedural states: Secondary | ICD-10-CM | POA: Diagnosis not present

## 2022-05-25 ENCOUNTER — Other Ambulatory Visit: Payer: Self-pay | Admitting: Internal Medicine

## 2022-06-11 DIAGNOSIS — I10 Essential (primary) hypertension: Secondary | ICD-10-CM | POA: Diagnosis not present

## 2022-06-11 DIAGNOSIS — E119 Type 2 diabetes mellitus without complications: Secondary | ICD-10-CM | POA: Diagnosis not present

## 2022-06-11 DIAGNOSIS — R609 Edema, unspecified: Secondary | ICD-10-CM | POA: Diagnosis not present

## 2022-06-20 ENCOUNTER — Ambulatory Visit (INDEPENDENT_AMBULATORY_CARE_PROVIDER_SITE_OTHER): Payer: Medicare Other | Admitting: Podiatry

## 2022-06-20 ENCOUNTER — Encounter: Payer: Self-pay | Admitting: Podiatry

## 2022-06-20 VITALS — BP 130/65 | HR 71

## 2022-06-20 DIAGNOSIS — B351 Tinea unguium: Secondary | ICD-10-CM

## 2022-06-20 DIAGNOSIS — M79674 Pain in right toe(s): Secondary | ICD-10-CM

## 2022-06-20 DIAGNOSIS — M79675 Pain in left toe(s): Secondary | ICD-10-CM

## 2022-06-20 DIAGNOSIS — E1142 Type 2 diabetes mellitus with diabetic polyneuropathy: Secondary | ICD-10-CM | POA: Diagnosis not present

## 2022-06-20 NOTE — Progress Notes (Signed)
  Subjective:  Patient ID: Whitney Newton, female    DOB: 1940/12/28,  MRN: [id number]  Chief Complaint  Patient presents with   foot care    Patient is here for routine foot care.    82 y.o. female presents with the above complaint. History confirmed with patient. Patient presenting with pain related to dystrophic thickened elongated nails. Patient is unable to trim own nails related to nail dystrophy and/or mobility issues. Patient does  have a history of T2DM.   Objective:  Physical Exam: warm, good capillary refill nail exam onychomycosis of the toenails, onycholysis, and dystrophic nails DP pulses palpable, PT pulses palpable, and protective sensation absent Left Foot:  Pain with palpation of nails due to elongation and dystrophic growth.  Right Foot: Pain with palpation of nails due to elongation and dystrophic growth.   Assessment:   1. Pain due to onychomycosis of toenails of both feet   2. DM type 2 with diabetic peripheral neuropathy ([place])      Plan:  Patient was evaluated and treated and all questions answered.  Patient educated on diabetes. Discussed proper diabetic foot care and discussed risks and complications of disease. Educated patient in depth on reasons to return to the office immediately should he/she discover anything concerning or new on the feet. All questions answered. Discussed proper shoes as well.   #Onychomycosis with pain  -Nails palliatively debrided as below. -Educated on self-care  Procedure: Nail Debridement Rationale: Pain Type of Debridement: manual, sharp debridement. Instrumentation: Nail nipper, rotary burr. Number of Nails: 10  Return in about 3 months (around 09/19/2022) for [facility].         Everitt Amber, DPM [facility]

## 2022-06-25 DIAGNOSIS — Z6835 Body mass index (BMI) 35.0-35.9, adult: Secondary | ICD-10-CM | POA: Diagnosis not present

## 2022-06-25 DIAGNOSIS — Z1231 Encounter for screening mammogram for malignant neoplasm of breast: Secondary | ICD-10-CM | POA: Diagnosis not present

## 2022-06-25 DIAGNOSIS — Z01419 Encounter for gynecological examination (general) (routine) without abnormal findings: Secondary | ICD-10-CM | POA: Diagnosis not present

## 2022-06-28 ENCOUNTER — Ambulatory Visit
Admission: RE | Admit: 2022-06-28 | Discharge: 2022-06-28 | Disposition: A | Discharge status: Home / Self Care | Payer: Medicare Other | Source: Ambulatory Visit | Attending: Family Medicine | Admitting: Family Medicine

## 2022-06-28 ENCOUNTER — Other Ambulatory Visit: Payer: Self-pay | Admitting: Family Medicine

## 2022-06-28 DIAGNOSIS — R609 Edema, unspecified: Secondary | ICD-10-CM | POA: Diagnosis not present

## 2022-06-28 DIAGNOSIS — M25551 Pain in right hip: Secondary | ICD-10-CM | POA: Diagnosis not present

## 2022-06-28 DIAGNOSIS — L03116 Cellulitis of left lower limb: Secondary | ICD-10-CM | POA: Diagnosis not present

## 2022-07-02 ENCOUNTER — Other Ambulatory Visit: Payer: Self-pay | Admitting: Internal Medicine

## 2022-08-19 ENCOUNTER — Other Ambulatory Visit: Payer: Self-pay | Admitting: Internal Medicine

## 2022-08-26 ENCOUNTER — Ambulatory Visit: Payer: Medicare Other | Attending: Internal Medicine | Admitting: Internal Medicine

## 2022-08-26 VITALS — BP 117/69 | Ht 63.5 in | Wt 192.4 lb

## 2022-08-26 DIAGNOSIS — I2510 Atherosclerotic heart disease of native coronary artery without angina pectoris: Secondary | ICD-10-CM | POA: Diagnosis not present

## 2022-08-26 DIAGNOSIS — E785 Hyperlipidemia, unspecified: Secondary | ICD-10-CM

## 2022-08-26 DIAGNOSIS — I5032 Chronic diastolic (congestive) heart failure: Secondary | ICD-10-CM | POA: Diagnosis not present

## 2022-08-26 DIAGNOSIS — R600 Localized edema: Secondary | ICD-10-CM | POA: Insufficient documentation

## 2022-08-26 DIAGNOSIS — G629 Polyneuropathy, unspecified: Secondary | ICD-10-CM | POA: Diagnosis not present

## 2022-08-26 DIAGNOSIS — Z789 Other specified health status: Secondary | ICD-10-CM | POA: Insufficient documentation

## 2022-08-26 NOTE — Progress Notes (Addendum)
Cardiology Office Note   Date:  08/26/2022   ID:  Whitney Newton, DOB [birth year], MRN [id number]  PCP:  Aurea Graff.Marlou Sa, MD   CC: Follow-up  History of Present Illness: Whitney Newton is a 82 y.o. female who presents for  Four-month follow-up visit  This pleasant 82 year old woman is seen for a scheduled followup visit. The patient has a history of having had a stroke in early December 2014. She was at her diabetes clinic at [facility] and they diagnosed her and sent her straight to neurology where she was hospitalized for 3 days. The MRI showed a new stroke on the right side. She had carotid Dopplers which did not show any obstructive lesions but she did have some plaque and they put her back on aspirin and continued her Plavix. The patient had a echocardiogram which was a bubble study and did not show any evidence of right-to-left shunt.  More recently, she has had further neurology workup at [facility] on 06/20/14.  She had a transcranial Doppler.  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  Previous carotid Dopplers had not shown any significant external cranial disease  She has a history of essential hypertension, diabetes mellitus, and dyslipidemia. She has had atypical chest pain. She is intolerant of statin drugs. We updated her lexiscan Myoview stress test on 03/17/12 and it was normal showing no evidence of ischemia and her ejection fraction was 78%. The patient has never had a cardiac catheterization. She has continued to have some shortness of breath. She had an echocardiogram in 04/07/12 which showed an ejection fraction of 55-65% with grade 1 diastolic dysfunction. There is trivial aortic insufficiency.   The patient has a history of essential hypertension as well as atypical chest pain. Her diabetes is now being followed at the  [facility] clinic at [facility].  Since we last saw her she had an ophthalmology evaluation on 07/27/14 by Dr. Kathrin Penner and no diabetic retinopathy was found.  Her last chest x-ray was on 12/21/12 elsewhere and showed a normal heart size and low lung volumes. She had a recent CT scan of the chest on 03/23/13 at [facility] which showed normal heart size and demonstrated atherosclerosis of the aorta.   The patient has a past history of dermatofibrosarcoma of the spine. She had surgery for this at [facility]. She also has a past history of angiolipoma of the abdominal wall.   Since last visit she has not been experiencing any new cardiac symptoms.  She does have sleep apnea.  She uses a CPAP machine.  She has had some low back pain and pain in her hip radiating to her knees.  She has seen Dr. Durward Fortes. She was admitted to the hospital on 01/18/15 for chest and abdominal pain.  She had a CTA of the chest on 01/18/15 which was negative for pulmonary embolus.  The following day she had a Myoview on 01/19/15 which showed no ischemia and her ejection fraction was 85%.  She had a lot of side effects from the [facility].  It was felt in retrospect that her presenting complaints were probably GI in origin rather than cardiac.  She does have a history of GERD and sees Dr. Oletta Lamas.  since last visit she has had no new cardiac symptoms.  She has not been having any recent  chest discomfort. Her weight is unchanged and her blood pressures been stable.  11/22/2015  Whitney Newton presents today to establish care with me. She is a previous patient of Dr. Warren Danes. She recently was at the beach and developed some worsening shortness of breath and leg swelling. Her nephew was there who came down from [place] with a head cold and she seems to have symptoms now at this time. She was also seen at an urgent care there and placed on amoxicillin. She's taken that for a few days. She notes significant swelling  in her weight is much higher than it had been in the past. She endorses a high salt intake. Her last echocardiogram was in 2013 which showed an EF of 55-65% with grade 1 diastolic dysfunction. She had a stress test in 2016 for chest pain which showed an EF of 85%.  12/25/2015  I saw Whitney Newton that today in the office. She has been taking the Lasix 40 mg daily which was over a week and this resulted in significant diuresis. Her weight had reduced from 202 down to 189. She then had to stop it for a few days because of some other medical problems are currently weight is up to 194. Her BNP associate with this was very low at 8 and renal function appeared to be stable. She reports a 75% improvement in breathing. Her repeat echo shows that EF remains stable with grade 1 diastolic dysfunction.  03/13/2016  Whitney Newton reports she's had a marked improvement in her swelling and breathing with additional diuretics. Weight has been fairly stable. She reports one episode of chest pain which was responsive to nitroglycerin. She's also had some reflux symptoms and is currently on Zegerid which appears to be helping her symptoms. Is not clear whether these episodes of chest discomfort are related to reflux or coronary ischemia. She did have a negative Myoview stress test in August 2016.  08/29/2016  Whitney Newton was seen today in the office. Over the past several day she's had worsening shortness of breath and lower extremity swelling. Her weight is now up about 6 pounds from her previous office visit. She said over the auscultation is been taking Lasix more regularly. She's currently taking 40 mg daily but as previously taken that for 3 days and alternating with 20 mg. She notes some improvement today after a couple days of diuretics.  10/08/2016  Whitney Newton returns today for follow-up. She has diuresed several pounds with increased dose diuretics are per creatinine rose and therefore I decreased her diuretic back  to 40 mg daily. She says she feels like she is over dried out on that dose of diuretic. She occasionally gets some cramping. She's also had some labile blood sugars which she attributes to the diuretics. In addition she had a recent bowel obstruction which may been related to diuresis.  03/06/2017  Whitney Newton was seen today in follow-up. She recently got back from a trip to assess catch one with her husband. She said they were traveling for about 16 days and she did not take her Lasix during that period of time. Her weight is up about 6 pounds that she reports lower extremity swelling. She has a sliding scale Lasix to take but has not been compliant with that.  08/20/2017  Whitney Newton returns today for follow-up.  She has been having a lot of symptoms recently.  She is complaining of pain across her abdomen.  This is been further assessed and there  is a suggestion it could be related to back surgery.  She was also found to have mass on the liver and pancreas, thought to be hemangioma.  She is scheduled to have a CT scan of her abdomen next week.  She continues to gain weight.  Her weight is up 6 pounds since I saw her in September.  Which was 203 at the time.  Her weight had been as low as 195 last year.  She does report lower extremity edema and some worsening shortness of breath however she relates the shortness of breath more to her pain.  She had been on higher dose Lasix in the past but was noted to have some elevation in creatinine.  We did reduce the dose and she has been compliant taking 20 mg daily.  02/05/2018  Whitney Newton returns for follow-up today.  Again she has numerous complaints however she is recently been having worsening chest discomfort.  She describes it as a squeezing or pressure in the center of her chest sometimes radiates up her neck.  May be associated with worsening shortness of breath.  Despite this she is taken extra Lasix and her weight is come down to her baseline of 203.  She has not had  symptomatic improvement.  She was scheduled to see me in a few weeks but got an earlier appointment because of her chest discomfort.  Sometimes her symptoms are associated with exertion and relieved by rest at other times can be present at rest or when awakening.  02/23/2018  Whitney Newton was seen today in follow-up of heart cath.  She underwent left heart catheterization on 02/11/2018.  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  LVEF was 65% with normal LVEDP 18 mmHg.  Post cath she still reports shortness of breath with certain activities.  She did have recent lipid testing which was as follows: LDL 148, Total chol 225, HDL 54, Non-HDL 171, TG 178.  Her goal LDL is less than 70 and she has not yet reach that target.  Unfortunately she has been statin intolerant and is a good candidate for PCSK9 inhibitor.  05/22/2018  Whitney Newton is seen today in follow-up.  She was started on Repatha which she seems to be tolerating well.  She has been on the medicine now for about 3 months and is getting it from free with support from [place].  Recently she had transcranial Dopplers at [facility] which actually shows some mild improvement in her intracranial atherosclerosis.  This could be related to her aggressive lipid therapy.  She was supposed to have repeat labs prior to this visit however it was not performed.  We will need to order a repeat lipid profile in order to reassess her therapy so far.  08/10/2018  Whitney Newton returns today for follow-up.  She has been doing well with Repatha although has had several injections which have possibly failed.  She seems to be injecting on the anterior thigh and has had some bleeding and bruising.  She also has trouble depressing the injector and noted some leakage of the medication.  I did  perform some extra training with her today and helped describe to her a better way to hold the pen as well as a better injection site which I think should improve her drug delivery.  That being said  in December, her total cholesterol come down significantly to 104, with HDL 62, LDL 26 and triglycerides of 81.  Hemoglobin A1c remains persistently elevated at 8.2.  04/16/2019  Whitney Newton is seen today in follow-up.  She thinks she might of had another stroke in May.  She had some word finding difficulties.  She is being followed by neurology at [facility] and had a recent transcranial Doppler and may have an MRI because she is been having some numbness and tingling in her arms.  She seems to be tolerating Repatha.  Her labs are markedly improved.  Total cholesterol now 104, triglycerides 81, HDL 62 and LDL 26.  No significant side effects with it.  10/22/2019  Whitney Newton returns today for follow-up with her husband.  She denies any new stroke symptoms.  She is responded well to Repatha and although her LDL was down as low as 14 it is now come up to 49 about 6 months ago.  Again this is well controlled however the increase is due to dietary changes.  She says she is eating more pizza, bacon and eggs and other saturated fats.  In addition she has gained weight.  There is also lower extremity edema.  She reports lack of compliance with the Lasix.  She says she may go several days without taking any and then has to take several days worth.  She has had issues with incontinence and a spastic bladder.  04/19/2020  Whitney Newton returns today with her husband.  Overall she reports some worsening shortness of breath.  She has had lower extremity edema and weight gain despite taking her Lasix more regularly.  She takes 40 mg daily.  Weight is up again another few pounds.  She is not had a repeat echo since 2017 which showed normal LVEF at the time but diastolic dysfunction.  01/24/2021  Whitney Newton returns today in follow-up.  She has a number  of different complaints today.  She is really struggling with fatigue and some daytime somnolence.  She says she has trouble sleeping at night.  I think she is struggling with neuropathy.  She has lower extremity edema but recently we had increased her diuretics.  Her BNP was low and I recommended instead of 40 twice daily to go to 60 mg Lasix daily.  Potassium and electrolytes are normal however she has had leg cramps and restlessness mostly at night.  06/08/2021  Whitney Newton is seen today in follow-up.  She was just hospitalized at [facility] for syncopal episode.  This occurred in her home.  She apparently became somewhat unresponsive and her husband called 911.  She then syncopized and was awakened by EMS.  She was taken to [facility].  Initial work-up was unremarkable.  She apparently had an elevated D-dimer but testing was negative for PE.  Repeat echo showed normal LVEF 60 to 65%.  She was noted to be dehydrated and taken off of her Ziac.  She had previously discontinued Lasix which I placed her on before.  She says that her swelling is actually been a little better and she is urinating less.  They increased her losartan from 25 to 50 mg daily and noted she was B12 deficient and had given her some B12 injections.  She says her energy level has improved somewhat.  EKG here shows normal sinus rhythm at 94.  She was placed on a monitor which she recently returned but those results were not available and would be interpreted by the staff at [facility].  08/10/2021  Whitney Newton returns today for follow-up of her monitor and worsening edema.  Blood pressure is also been poorly controlled recently.  She is been taken off of Ziac.  She was on metoprolol which I started but she said she could not tolerate it due to joint pains.  She also has had worsening lower extremity edema and weight gain up another 5 pounds since I saw her in December.  Overall she does not feel well.  Unfortunately she has numerous medication intolerances and it  has been difficult to find a regimen that she could tolerate.  02/14/2022  Whitney Newton is seen today in follow-up.  She was recently seen by Whitney CrumbleJennifer Lambert, Whitney Newton and noted to be volume overloaded.  She was not tolerant of furosemide however she was trialed on torsemide.  This seemed to work a little better for her and she had actually lost quite a bit of weight down to about 203 pounds however her weight now is back up to 212 pounds.  She also has regained a lot of fluid.  It is likely because she has not been taking the torsemide which she thought she may only have to take as needed.  For some reason her prescription torsemide was only written for 1 dose.  She did have repeat labs which showed essentially no change in renal function.  08/26/2022  Whitney Newton returns today for follow-up.  Unfortunately her husband passed away at the end of last year.  Her family does seem to be quite supportive of her and she says she is starting to get more involved with church.  She did lose a significant amount of weight, about 20 pounds since September.  Part of that I think was fluid as she has been on torsemide but she says at times now she is not able to take it on a daily basis because she gets significant cramping.  She is still struggling from some swelling in her legs.  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 She has tried to wear stockings but has been unable to get them on or tolerate them. Patient has attempted exercise, elevation, and compression for four weeks without success. Her BNP recently was negative.  She does have difficulty walking and is requesting a handicap parking sticker.   Past Medical History:  Diagnosis Date   Acute diastolic (congestive) heart failure (HCC) 12/25/2015   Back pain    prior back surgery in 2009 - slow to recover   Cancer (HCC)    Diabetes (HCC)    Diverticulitis    Gastritis    Hemangioma of liver    managed conservatively; [facility]   HLD (hyperlipidemia)    statin  intolerant   Hypertension    Normal cardiac stress test 2011   Obesity    Thyroid disease    hypothyroidism    Past Surgical History:  Procedure Laterality Date   ABDOMINAL HYSTERECTOMY     APPENDECTOMY     LEFT HEART CATH AND CORONARY ANGIOGRAPHY N/A 02/11/2018   Procedure: LEFT HEART CATH AND CORONARY ANGIOGRAPHY;  Surgeon: Lennette BihariKelly, Thomas A, MD;  Location: [facility] INVASIVE CV LAB;  Service: Cardiovascular;  Laterality: N/A;   NASAL RECONSTRUCTION       Current Outpatient Medications  Medication Sig Dispense Refill   aspirin 81 MG tablet Take 81 mg by mouth daily.     clopidogrel (PLAVIX) 75 MG tablet Take 1 tablet (75 mg total) by mouth daily. 90 tablet 3   colesevelam (WELCHOL) 625 MG tablet Take  3 tablets (1,875 mg total) by mouth 2 (two) times daily as needed (cholesterol). 180 tablet 6   diclofenac sodium (VOLTAREN) 1 % GEL Apply 1 application topically at bedtime as needed (for pain).     Evolocumab 140 MG/ML SOAJ Inject 1 mL into the skin every 14 (fourteen) days. 2 mL 11   famotidine (PEPCID) 10 MG tablet Take 10 mg by mouth daily in the afternoon.     gabapentin (NEURONTIN) 100 MG capsule Take 3 capsules (300mg ) by mouth at night and take additional 1-3 capsules daily as needed for breakthrough. 180 capsule 5   Glucagon 0.5 MG/0.1ML SOAJ      HUMALOG KWIKPEN 100 UNIT/ML KiwkPen Inject 14 Units into the skin See admin instructions. Inject 11 units SQ at breakfast, inject 14 units SQ at lunch and inject 16 units SQ at supper. Plus sliding scale 1 unit for every 25 BS is > 120     Hyoscyamine Sulfate 0.375 MG TBCR Take 1 tablet every 12 hours by oral route.     levothyroxine (SYNTHROID, LEVOTHROID) 88 MCG tablet Take 88 mcg by mouth daily.      Liniments (SALONPAS EX) Apply 1 patch topically daily as needed (for pain).     losartan (COZAAR) 25 MG tablet Take 2 tablets (50 mg total) by mouth daily. 90 tablet 3   meclizine (ANTIVERT) 12.5 MG tablet Take 1 tablet (12.5 mg total) by mouth  3 (three) times daily as needed for dizziness. 30 tablet 0   nitroGLYCERIN (NITROSTAT) 0.4 MG SL tablet Place 1 tablet (0.4 mg total) under the tongue every 5 (five) minutes as needed for chest pain. 25 tablet 2   ondansetron (ZOFRAN ODT) 4 MG disintegrating tablet Take 1 tablet (4 mg total) by mouth every 8 (eight) hours as needed for nausea or vomiting. 20 tablet 0   potassium chloride SA (KLOR-CON M) 20 MEQ tablet Take 1 tablet (20 mEq total) by mouth daily in the afternoon. 90 tablet 3   torsemide (DEMADEX) 20 MG tablet Take 1 tablet (20 mg total) by mouth daily. 90 tablet 3   TOUJEO SOLOSTAR 300 UNIT/ML SOPN Inject 22 Units into the skin.      No current facility-administered medications for this visit.    Allergies:   Celecoxib, Lipitor [atorvastatin calcium], Zetia [ezetimibe], Ropinirole hcl, Actos [pioglitazone hydrochloride], Crestor [rosuvastatin calcium], Doxycycline, Duloxetine, Fenofibrate, Fenofibrate, Imdur [isosorbide nitrate], Isosorbide, Metformin and related, Metoprolol succinate [metoprolol], Niaspan [niacin], Pravachol, Pravachol [pravastatin], Ranolazine er, Ropinirole, Rosuvastatin, Niaspan [niacin er], and Ranolazine    Social History:  The patient  reports that she has never smoked. She has never used smokeless tobacco. She reports that she does not drink alcohol and does not use drugs.   Family History:  The patient's family history includes Dementia in her father; Heart attack in her brother and father; Heart disease in her brother and father; Hypertension in her mother; Stroke in her mother.    ROS:   Pertinent items noted in HPI and remainder of comprehensive ROS otherwise negative.  PHYSICAL EXAM: VS:  BP 117/69   Ht 5' 3.5" (1.613 m)   Wt 192 lb 6.4 oz (87.3 kg)   SpO2 99%   BMI 33.55 kg/m  , BMI Body mass index is 33.55 kg/m. General appearance: alert, no distress and moderately obese Neck: no carotid bruit and no JVD Lungs: diminished breath sounds  bilaterally Heart: regular rate and rhythm Abdomen: soft, non-tender; bowel sounds normal; no masses,  no organomegaly Extremities:  edema 2+ pedal edema, notable bilateral LE hyperpigmentation, small varicosities and spider veins are noted, the edema is mildly pitting Pulses: 2+ and symmetric Skin: There is lower extremity hyperpigmentation/hemosiderin deposition Neurologic: Grossly normal Psych: Mildly anxious  EKG:   Normal sinus rhythm at 96, low voltage QRS--personally reviewed  Recent Labs: 02/21/2022: BNP 17.9; BUN 26; Creatinine, Ser 0.91; Potassium 4.6; Sodium 143    Lipid Panel    Component Value Date/Time   CHOL 129 01/10/2022 0835   TRIG 97 01/10/2022 0835   HDL 72 01/10/2022 0835   CHOLHDL 1.8 01/10/2022 0835   CHOLHDL 3.8 11/15/2015 0954   VLDL 20 11/15/2015 0954   LDLCALC 39 01/10/2022 0835   LDLDIRECT 148.7 09/14/2012 0857      Wt Readings from Last 3 Encounters:  08/26/22 192 lb 6.4 oz (87.3 kg)  02/14/22 212 lb (96.2 kg)  12/27/21 213 lb 9.6 oz (96.9 kg)     ASSESSMENT AND PLAN:  Syncope, possibly vasovagal Chronic diastolic congestive heart failure Hypertensive heart disease without heart failure Diabetes mellitus - on insulin Atypical chest pain with normal Myoview stress test in August 2016.  No ischemia.  Ejection fraction 85%. old CVA followed at [facility] History of dermatofibro- sarcoma of the spine. Dyslipidemia Osteoarthritis OSA on CPAP [facility]) Hypothyroidism followed at [facility]. GERD, followed by Dr. Randa Evens  RBBB Peripheral neuropathy Statin intolerance - on Repatha Lymphedema   PLAN:  Mrs. Ventress unfortunately is grieving over the loss of her husband.  He was a patient of mine as well and will be missed.  She has been having some persistent swelling which is likely lymphedema, as her BNP has been low and she was noted to have had severe cramping with daily use of torsemide.  I advised her to decrease from 20 to  10 mg on that dose and she could continue to use it as needed.  She would benefit from mobilization of her lower extremity edema but could not tolerate stocking wear.  Will look into SCD devices/lymphedema pumps for her.  She has attempted exercise, elevation, and compression for four weeks without benefit. Her lipids actually look excellent now on Repatha.  Labs in October showed an LDL of 48.  Follow-up in 6 months or sooner as necessary.  Chrystie Nose, MD, [facility], FACP  [facility]  Medical Director of the Advanced Lipid Disorders &  Cardiovascular Risk Reduction Clinic Diplomate of the American Board of Clinical Lipidology Attending Cardiologist  Direct Dial: [phone]  Fax: [phone]  Website:  [email or web address]   08/26/2022 3:44 PM

## 2022-08-26 NOTE — Patient Instructions (Signed)
Medication Instructions:  OK to decrease torsemide to 10mg  daily (as needed)  *If you need a refill on your cardiac medications before your next appointment, please call your pharmacy*   Follow-Up: At [facility], you and your health needs are our priority.  As part of our continuing mission to provide you with exceptional heart care, we have created designated Provider Care Teams.  These Care Teams include your primary Cardiologist (physician) and Advanced Practice Providers (APPs -  Physician Assistants and Nurse Practitioners) who all work together to provide you with the care you need, when you need it.  We recommend signing up for the patient portal called "MyChart".  Sign up information is provided on this After Visit Summary.  MyChart is used to connect with patients for Virtual Visits (Telemedicine).  Patients are able to view lab/test results, encounter notes, upcoming appointments, etc.  Non-urgent messages can be sent to your provider as well.   To learn more about what you can do with MyChart, go to [email or web address].    Your next appointment:     6 months with Dr. Debara Pickett

## 2022-08-30 ENCOUNTER — Telehealth: Payer: Self-pay | Admitting: Internal Medicine

## 2022-08-30 ENCOUNTER — Other Ambulatory Visit: Payer: Self-pay | Admitting: Internal Medicine

## 2022-08-30 NOTE — Telephone Encounter (Signed)
Faxed MD note, demographics to [place] at [phone]

## 2022-08-30 NOTE — Telephone Encounter (Signed)
Spoke with [employer] rep. MD note needs to include specifics for insurance approval of SCDs. Message sent to MD

## 2022-09-02 ENCOUNTER — Telehealth: Payer: Self-pay | Admitting: Internal Medicine

## 2022-09-02 DIAGNOSIS — R000 Tachycardia, unspecified: Secondary | ICD-10-CM | POA: Diagnosis not present

## 2022-09-02 DIAGNOSIS — R600 Localized edema: Secondary | ICD-10-CM | POA: Diagnosis not present

## 2022-09-02 DIAGNOSIS — R5383 Other fatigue: Secondary | ICD-10-CM | POA: Diagnosis not present

## 2022-09-02 DIAGNOSIS — R0902 Hypoxemia: Secondary | ICD-10-CM | POA: Diagnosis not present

## 2022-09-02 DIAGNOSIS — R42 Dizziness and giddiness: Secondary | ICD-10-CM | POA: Diagnosis not present

## 2022-09-02 DIAGNOSIS — R5381 Other malaise: Secondary | ICD-10-CM | POA: Diagnosis not present

## 2022-09-02 DIAGNOSIS — R0609 Other forms of dyspnea: Secondary | ICD-10-CM | POA: Diagnosis not present

## 2022-09-02 DIAGNOSIS — R0602 Shortness of breath: Secondary | ICD-10-CM | POA: Diagnosis not present

## 2022-09-02 DIAGNOSIS — K8689 Other specified diseases of pancreas: Secondary | ICD-10-CM | POA: Diagnosis not present

## 2022-09-02 DIAGNOSIS — L03116 Cellulitis of left lower limb: Secondary | ICD-10-CM | POA: Diagnosis not present

## 2022-09-02 DIAGNOSIS — R110 Nausea: Secondary | ICD-10-CM | POA: Diagnosis not present

## 2022-09-02 DIAGNOSIS — R531 Weakness: Secondary | ICD-10-CM | POA: Diagnosis not present

## 2022-09-02 DIAGNOSIS — Z20822 Contact with and (suspected) exposure to covid-19: Secondary | ICD-10-CM | POA: Diagnosis not present

## 2022-09-02 DIAGNOSIS — I10 Essential (primary) hypertension: Secondary | ICD-10-CM | POA: Diagnosis not present

## 2022-09-02 NOTE — Telephone Encounter (Signed)
STAT if HR is under 50 or over 120 (normal HR is 60-100 beats per minute)  What is your heart rate?  123 currently at rest   Do you have a log of your heart rate readings (document readings)?  114, 123  Do you have any other symptoms?  SOB + patient mentions being sick since Thursday: chills, nausea, no appetite for 48 hours, negative for COVID  HR is elevated

## 2022-09-02 NOTE — Telephone Encounter (Signed)
Received a call from patient she stated she has been having severe chills since this past Thurs.She has nauseated,unable to eat.No chest pain.No fever.She tested negative for covid.Stated her heart beats fast when she moves around.Heart beat at present 70.Advised to see PCP.I will make Dr.Hilty aware.

## 2022-09-03 DIAGNOSIS — K8689 Other specified diseases of pancreas: Secondary | ICD-10-CM | POA: Diagnosis not present

## 2022-09-03 DIAGNOSIS — R0602 Shortness of breath: Secondary | ICD-10-CM | POA: Diagnosis not present

## 2022-09-03 DIAGNOSIS — R600 Localized edema: Secondary | ICD-10-CM | POA: Diagnosis not present

## 2022-09-09 DIAGNOSIS — I4519 Other right bundle-branch block: Secondary | ICD-10-CM | POA: Diagnosis not present

## 2022-09-10 DIAGNOSIS — L0390 Cellulitis, unspecified: Secondary | ICD-10-CM | POA: Diagnosis not present

## 2022-09-10 DIAGNOSIS — E119 Type 2 diabetes mellitus without complications: Secondary | ICD-10-CM | POA: Diagnosis not present

## 2022-09-10 DIAGNOSIS — G4733 Obstructive sleep apnea (adult) (pediatric): Secondary | ICD-10-CM | POA: Diagnosis not present

## 2022-09-10 DIAGNOSIS — I679 Cerebrovascular disease, unspecified: Secondary | ICD-10-CM | POA: Diagnosis not present

## 2022-09-10 DIAGNOSIS — I10 Essential (primary) hypertension: Secondary | ICD-10-CM | POA: Diagnosis not present

## 2022-09-10 DIAGNOSIS — K869 Disease of pancreas, unspecified: Secondary | ICD-10-CM | POA: Diagnosis not present

## 2022-09-10 DIAGNOSIS — R609 Edema, unspecified: Secondary | ICD-10-CM | POA: Diagnosis not present

## 2022-09-10 DIAGNOSIS — K219 Gastro-esophageal reflux disease without esophagitis: Secondary | ICD-10-CM | POA: Diagnosis not present

## 2022-09-10 DIAGNOSIS — E039 Hypothyroidism, unspecified: Secondary | ICD-10-CM | POA: Diagnosis not present

## 2022-09-10 DIAGNOSIS — E7800 Pure hypercholesterolemia, unspecified: Secondary | ICD-10-CM | POA: Diagnosis not present

## 2022-09-10 DIAGNOSIS — D1803 Hemangioma of intra-abdominal structures: Secondary | ICD-10-CM | POA: Diagnosis not present

## 2022-09-11 DIAGNOSIS — I4510 Unspecified right bundle-branch block: Secondary | ICD-10-CM | POA: Diagnosis not present

## 2022-09-16 DIAGNOSIS — Z794 Long term (current) use of insulin: Secondary | ICD-10-CM | POA: Diagnosis not present

## 2022-09-16 DIAGNOSIS — Z6835 Body mass index (BMI) 35.0-35.9, adult: Secondary | ICD-10-CM | POA: Diagnosis not present

## 2022-09-16 DIAGNOSIS — E1165 Type 2 diabetes mellitus with hyperglycemia: Secondary | ICD-10-CM | POA: Diagnosis not present

## 2022-09-19 ENCOUNTER — Ambulatory Visit (INDEPENDENT_AMBULATORY_CARE_PROVIDER_SITE_OTHER): Payer: Medicare Other | Admitting: Podiatry

## 2022-09-19 DIAGNOSIS — M79675 Pain in left toe(s): Secondary | ICD-10-CM

## 2022-09-19 DIAGNOSIS — M79674 Pain in right toe(s): Secondary | ICD-10-CM

## 2022-09-19 DIAGNOSIS — E1142 Type 2 diabetes mellitus with diabetic polyneuropathy: Secondary | ICD-10-CM

## 2022-09-19 DIAGNOSIS — B351 Tinea unguium: Secondary | ICD-10-CM

## 2022-09-19 NOTE — Progress Notes (Signed)
  Subjective:  Patient ID: Whitney Newton, female    DOB: [birth year],  MRN: [id number]  Chief Complaint  Patient presents with   diabetic foot care     82 y.o. female presents with the above complaint. History confirmed with patient. Patient presenting with pain related to dystrophic thickened elongated nails. Patient is unable to trim own nails related to nail dystrophy and/or mobility issues. Patient does  have a history of T2DM.   Objective:  Physical Exam: warm, good capillary refill nail exam onychomycosis of the toenails, onycholysis, and dystrophic nails DP pulses palpable, PT pulses palpable, and protective sensation absent Left Foot:  Pain with palpation of nails due to elongation and dystrophic growth.  Right Foot: Pain with palpation of nails due to elongation and dystrophic growth.   Assessment:   1. Pain due to onychomycosis of toenails of both feet   2. DM type 2 with diabetic peripheral neuropathy     Plan:  Patient was evaluated and treated and all questions answered.   #Onychomycosis with pain  -Nails palliatively debrided as below. -Educated on self-care  Procedure: Nail Debridement Rationale: Pain Type of Debridement: manual, sharp debridement. Instrumentation: Nail nipper, rotary burr. Number of Nails: 10  Return in about 3 months (around 12/19/2022) for [facility].         Corinna Gab, DPM [facility]

## 2022-10-14 DIAGNOSIS — E1169 Type 2 diabetes mellitus with other specified complication: Secondary | ICD-10-CM | POA: Diagnosis not present

## 2022-10-14 DIAGNOSIS — Z8673 Personal history of transient ischemic attack (TIA), and cerebral infarction without residual deficits: Secondary | ICD-10-CM | POA: Diagnosis not present

## 2022-10-14 DIAGNOSIS — D6869 Other thrombophilia: Secondary | ICD-10-CM | POA: Diagnosis not present

## 2022-10-14 DIAGNOSIS — L03119 Cellulitis of unspecified part of limb: Secondary | ICD-10-CM | POA: Diagnosis not present

## 2022-10-15 ENCOUNTER — Telehealth: Payer: Self-pay | Admitting: Internal Medicine

## 2022-10-15 NOTE — Telephone Encounter (Signed)
Advised patient

## 2022-10-15 NOTE — Telephone Encounter (Signed)
Pt stated she was measured for her compressor boot a while ago and hasn't heard anything else about it or when she'll get it. She also stated she was started on a new medication called Nuzyra and wanted to make MD aware since its so new to the [facility], she wants to make sure its not interring with anything he has her taking although it's working for her. Pt is requesting a callback regarding this matter. Please advise

## 2022-10-15 NOTE — Telephone Encounter (Signed)
Patient has been started on new medication Nuzyra by PCP for cellulitis  She wants to be sure will not affect any thing she is currently taking or any conditions.She was able to get first two doses through PCP and the rest will be mailed to her.  She states she is already seeing results from them. It does cause a little nausea since she cannot eat 2 hours before and after.   She has not received her boots as yet.  She has been measured but not received yet.

## 2022-10-23 DIAGNOSIS — B353 Tinea pedis: Secondary | ICD-10-CM | POA: Diagnosis not present

## 2022-10-25 ENCOUNTER — Telehealth: Payer: Self-pay | Admitting: Internal Medicine

## 2022-10-25 DIAGNOSIS — L089 Local infection of the skin and subcutaneous tissue, unspecified: Secondary | ICD-10-CM | POA: Diagnosis not present

## 2022-10-25 DIAGNOSIS — E7800 Pure hypercholesterolemia, unspecified: Secondary | ICD-10-CM | POA: Diagnosis not present

## 2022-10-25 DIAGNOSIS — E119 Type 2 diabetes mellitus without complications: Secondary | ICD-10-CM | POA: Diagnosis not present

## 2022-10-25 DIAGNOSIS — I10 Essential (primary) hypertension: Secondary | ICD-10-CM | POA: Diagnosis not present

## 2022-10-25 DIAGNOSIS — R609 Edema, unspecified: Secondary | ICD-10-CM | POA: Diagnosis not present

## 2022-10-25 NOTE — Telephone Encounter (Signed)
Spoke to patient she stated she has been having episodes of fast heart beat.Stated a couple of days ago heart rate 142.Stated she feels ok.Stated her apple watch alerts her.Appointment scheduled with Dr.Hilty 5/21 at 3:00 pm.Stated she still has not received compression boots.I will make Dr.Hilty aware.

## 2022-10-25 NOTE — Telephone Encounter (Signed)
STAT if HR is under 50 or over 120 (normal HR is 60-100 beats per minute)  What is your heart rate? Now 107  Do you have a log of your heart rate readings (document readings)? Several weeks ago 140,132, 111  Do you have any other symptoms? None now   Patient states she was measured for compression boots but she still has not heard anything back yet. She says she also is calling to inform her HR has still been high. She says she was given an antibiotic.

## 2022-10-29 ENCOUNTER — Ambulatory Visit (INDEPENDENT_AMBULATORY_CARE_PROVIDER_SITE_OTHER): Payer: Medicare Other

## 2022-10-29 ENCOUNTER — Encounter: Payer: Self-pay | Admitting: Internal Medicine

## 2022-10-29 ENCOUNTER — Ambulatory Visit: Payer: Medicare Other | Attending: Internal Medicine | Admitting: Internal Medicine

## 2022-10-29 VITALS — BP 120/58 | HR 83 | Ht 63.0 in | Wt 191.4 lb

## 2022-10-29 DIAGNOSIS — R600 Localized edema: Secondary | ICD-10-CM | POA: Diagnosis not present

## 2022-10-29 DIAGNOSIS — I5032 Chronic diastolic (congestive) heart failure: Secondary | ICD-10-CM | POA: Insufficient documentation

## 2022-10-29 DIAGNOSIS — R002 Palpitations: Secondary | ICD-10-CM

## 2022-10-29 DIAGNOSIS — R000 Tachycardia, unspecified: Secondary | ICD-10-CM

## 2022-10-29 DIAGNOSIS — R55 Syncope and collapse: Secondary | ICD-10-CM | POA: Diagnosis not present

## 2022-10-29 NOTE — Telephone Encounter (Signed)
Patient had appointment with Dr.Hilty today 5/21.Marland Kitchen

## 2022-10-29 NOTE — Progress Notes (Signed)
Cardiology Office Note   Date:  10/29/2022   ID:  Whitney Newton, DOB 12-18-1940, MRN [id number]  PCP:  Irven Coe, MD   CC: Follow-up  History of Present Illness: Whitney Newton is a 82 y.o. female who presents for  Four-month follow-up visit  This pleasant 82 year old woman is seen for a scheduled followup visit. The patient has a history of having had a stroke in early December 2014. She was at her diabetes clinic at [facility] and they diagnosed her and sent her straight to neurology where she was hospitalized for 3 days. The MRI showed a new stroke on the right side. She had carotid Dopplers which did not show any obstructive lesions but she did have some plaque and they put her back on aspirin and continued her Plavix. The patient had a echocardiogram which was a bubble study and did not show any evidence of right-to-left shunt.  More recently, she has had further neurology workup at [facility] on 06/20/14.  She had a transcranial Doppler.  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  Previous carotid Dopplers had not shown any significant external cranial disease  She has a history of essential hypertension, diabetes mellitus, and dyslipidemia. She has had atypical chest pain. She is intolerant of statin drugs. We updated her lexiscan Myoview stress test on 03/17/12 and it was normal showing no evidence of ischemia and her ejection fraction was 78%. The patient has never had a cardiac catheterization. She has continued to have some shortness of breath. She had an echocardiogram in 04/07/12 which showed an ejection fraction of 55-65% with grade 1 diastolic dysfunction. There is trivial aortic insufficiency.   The patient has a history of essential hypertension as well as atypical chest pain. Her diabetes is now being followed at the [facility]  clinic at [facility].  Since we last saw her she had an ophthalmology evaluation on 07/27/14 by Dr. Dagoberto Ligas and no diabetic retinopathy was found.  Her last chest x-ray was on 12/21/12 elsewhere and showed a normal heart size and low lung volumes. She had a recent CT scan of the chest on 03/23/13 at [facility] which showed normal heart size and demonstrated atherosclerosis of the aorta.   The patient has a past history of dermatofibrosarcoma of the spine. She had surgery for this at [facility]. She also has a past history of angiolipoma of the abdominal wall.   Since last visit she has not been experiencing any new cardiac symptoms.  She does have sleep apnea.  She uses a CPAP machine.  She has had some low back pain and pain in her hip radiating to her knees.  She has seen Dr. Cleophas Dunker. She was admitted to the hospital on 01/18/15 for chest and abdominal pain.  She had a CTA of the chest on 01/18/15 which was negative for pulmonary embolus.  The following day she had a Myoview on 01/19/15 which showed no ischemia and her ejection fraction was 85%.  She had a lot of side effects from the [facility].  It was felt in retrospect that her presenting complaints were probably GI in origin rather than cardiac.  She does have a history of GERD and sees Dr. Randa Evens.  since last visit she has had no new cardiac symptoms.  She has not been having any recent  chest discomfort. Her weight is unchanged and her blood pressures been stable.  11/22/2015  Whitney Newton presents today to establish care with me. She is a previous patient of Dr. Ronny Flurry. She recently was at the beach and developed some worsening shortness of breath and leg swelling. Her nephew was there who came down from [place] with a head cold and she seems to have symptoms now at this time. She was also seen at an urgent care there and placed on amoxicillin. She's taken that for a few days. She notes significant swelling in her  weight is much higher than it had been in the past. She endorses a high salt intake. Her last echocardiogram was in 2013 which showed an EF of 55-65% with grade 1 diastolic dysfunction. She had a stress test in 2016 for chest pain which showed an EF of 85%.  12/25/2015  I saw Whitney Newton that today in the office. She has been taking the Lasix 40 mg daily which was over a week and this resulted in significant diuresis. Her weight had reduced from 202 down to 189. She then had to stop it for a few days because of some other medical problems are currently weight is up to 194. Her BNP associate with this was very low at 8 and renal function appeared to be stable. She reports a 75% improvement in breathing. Her repeat echo shows that EF remains stable with grade 1 diastolic dysfunction.  03/13/2016  Whitney Newton reports she's had a marked improvement in her swelling and breathing with additional diuretics. Weight has been fairly stable. She reports one episode of chest pain which was responsive to nitroglycerin. She's also had some reflux symptoms and is currently on Zegerid which appears to be helping her symptoms. Is not clear whether these episodes of chest discomfort are related to reflux or coronary ischemia. She did have a negative Myoview stress test in August 2016.  08/29/2016  Whitney Newton was seen today in the office. Over the past several day she's had worsening shortness of breath and lower extremity swelling. Her weight is now up about 6 pounds from her previous office visit. She said over the auscultation is been taking Lasix more regularly. She's currently taking 40 mg daily but as previously taken that for 3 days and alternating with 20 mg. She notes some improvement today after a couple days of diuretics.  10/08/2016  Whitney Newton returns today for follow-up. She has diuresed several pounds with increased dose diuretics are per creatinine rose and therefore I decreased her diuretic back to 40  mg daily. She says she feels like she is over dried out on that dose of diuretic. She occasionally gets some cramping. She's also had some labile blood sugars which she attributes to the diuretics. In addition she had a recent bowel obstruction which may been related to diuresis.  03/06/2017  Whitney Newton was seen today in follow-up. She recently got back from a trip to assess catch one with her husband. She said they were traveling for about 16 days and she did not take her Lasix during that period of time. Her weight is up about 6 pounds that she reports lower extremity swelling. She has a sliding scale Lasix to take but has not been compliant with that.  08/20/2017  Whitney Newton returns today for follow-up.  She has been having a lot of symptoms recently.  She is complaining of pain across her abdomen.  This is been further assessed and there  is a suggestion it could be related to back surgery.  She was also found to have mass on the liver and pancreas, thought to be hemangioma.  She is scheduled to have a CT scan of her abdomen next week.  She continues to gain weight.  Her weight is up 6 pounds since I saw her in September.  Which was 203 at the time.  Her weight had been as low as 195 last year.  She does report lower extremity edema and some worsening shortness of breath however she relates the shortness of breath more to her pain.  She had been on higher dose Lasix in the past but was noted to have some elevation in creatinine.  We did reduce the dose and she has been compliant taking 20 mg daily.  02/05/2018  Whitney Newton returns for follow-up today.  Again she has numerous complaints however she is recently been having worsening chest discomfort.  She describes it as a squeezing or pressure in the center of her chest sometimes radiates up her neck.  May be associated with worsening shortness of breath.  Despite this she is taken extra Lasix and her weight is come down to her baseline of 203.  She has not had symptomatic  improvement.  She was scheduled to see me in a few weeks but got an earlier appointment because of her chest discomfort.  Sometimes her symptoms are associated with exertion and relieved by rest at other times can be present at rest or when awakening.  02/23/2018  Whitney Newton was seen today in follow-up of heart cath.  She underwent left heart catheterization on 02/11/2018.  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  LVEF was 65% with normal LVEDP 18 mmHg.  Post cath she still reports shortness of breath with certain activities.  She did have recent lipid testing which was as follows: LDL 148, Total chol 225, HDL 54, Non-HDL 171, TG 178.  Her goal LDL is less than 70 and she has not yet reach that target.  Unfortunately she has been statin intolerant and is a good candidate for PCSK9 inhibitor.  05/22/2018  Whitney Newton is seen today in follow-up.  She was started on Repatha which she seems to be tolerating well.  She has been on the medicine now for about 3 months and is getting it from free with support from [employer].  Recently she had transcranial Dopplers at [facility] which actually shows some mild improvement in her intracranial atherosclerosis.  This could be related to her aggressive lipid therapy.  She was supposed to have repeat labs prior to this visit however it was not performed.  We will need to order a repeat lipid profile in order to reassess her therapy so far.  08/10/2018  Whitney Newton returns today for follow-up.  She has been doing well with Repatha although has had several injections which have possibly failed.  She seems to be injecting on the anterior thigh and has had some bleeding and bruising.  She also has trouble depressing the injector and noted some leakage of the medication.  I did perform some  extra training with her today and helped describe to her a better way to hold the pen as well as a better injection site which I think should improve her drug delivery.  That being said  in December, her total cholesterol come down significantly to 104, with HDL 62, LDL 26 and triglycerides of 81.  Hemoglobin A1c remains persistently elevated at 8.2.  04/16/2019  Javen is seen today in follow-up.  She thinks she might of had another stroke in May.  She had some word finding difficulties.  She is being followed by neurology at [facility] and had a recent transcranial Doppler and may have an MRI because she is been having some numbness and tingling in her arms.  She seems to be tolerating Repatha.  Her labs are markedly improved.  Total cholesterol now 104, triglycerides 81, HDL 62 and LDL 26.  No significant side effects with it.  10/22/2019  Whitney Newton returns today for follow-up with her husband.  She denies any new stroke symptoms.  She is responded well to Repatha and although her LDL was down as low as 14 it is now come up to 49 about 6 months ago.  Again this is well controlled however the increase is due to dietary changes.  She says she is eating more pizza, bacon and eggs and other saturated fats.  In addition she has gained weight.  There is also lower extremity edema.  She reports lack of compliance with the Lasix.  She says she may go several days without taking any and then has to take several days worth.  She has had issues with incontinence and a spastic bladder.  04/19/2020  Whitney Newton returns today with her husband.  Overall she reports some worsening shortness of breath.  She has had lower extremity edema and weight gain despite taking her Lasix more regularly.  She takes 40 mg daily.  Weight is up again another few pounds.  She is not had a repeat echo since 2017 which showed normal LVEF at the time but diastolic dysfunction.  01/24/2021  Whitney Newton returns today in follow-up.  She has a number of different  complaints today.  She is really struggling with fatigue and some daytime somnolence.  She says she has trouble sleeping at night.  I think she is struggling with neuropathy.  She has lower extremity edema but recently we had increased her diuretics.  Her BNP was low and I recommended instead of 40 twice daily to go to 60 mg Lasix daily.  Potassium and electrolytes are normal however she has had leg cramps and restlessness mostly at night.  06/08/2021  Whitney Newton is seen today in follow-up.  She was just hospitalized at [facility] for syncopal episode.  This occurred in her home.  She apparently became somewhat unresponsive and her husband called 911.  She then syncopized and was awakened by EMS.  She was taken to [facility].  Initial work-up was unremarkable.  She apparently had an elevated D-dimer but testing was negative for PE.  Repeat echo showed normal LVEF 60 to 65%.  She was noted to be dehydrated and taken off of her Ziac.  She had previously discontinued Lasix which I placed her on before.  She says that her swelling is actually been a little better and she is urinating less.  They increased her losartan from 25 to 50 mg daily and noted she was B12 deficient and had given her some B12 injections.  She says her energy level has improved somewhat.  EKG here shows normal sinus rhythm at 94.  She was placed on a monitor which she recently returned but those results were not available and would be interpreted by the staff at [facility].  08/10/2021  Whitney Newton returns today for follow-up of her monitor and worsening edema.  Blood pressure is also been poorly controlled recently.  She is been taken off of Ziac.  She was on metoprolol which I started but she said she could not tolerate it due to joint pains.  She also has had worsening lower extremity edema and weight gain up another 5 pounds since I saw her in December.  Overall she does not feel well.  Unfortunately she has numerous medication intolerances and it has been  difficult to find a regimen that she could tolerate.  02/14/2022  Whitney Newton is seen today in follow-up.  She was recently seen by Juanda Crumble, PA-C and noted to be volume overloaded.  She was not tolerant of furosemide however she was trialed on torsemide.  This seemed to work a little better for her and she had actually lost quite a bit of weight down to about 203 pounds however her weight now is back up to 212 pounds.  She also has regained a lot of fluid.  It is likely because she has not been taking the torsemide which she thought she may only have to take as needed.  For some reason her prescription torsemide was only written for 1 dose.  She did have repeat labs which showed essentially no change in renal function.  08/26/2022  Whitney Newton returns today for follow-up.  Unfortunately her husband passed away at the end of last year.  Her family does seem to be quite supportive of her and she says she is starting to get more involved with church.  She did lose a significant amount of weight, about 20 pounds since September.  Part of that I think was fluid as she has been on torsemide but she says at times now she is not able to take it on a daily basis because she gets significant cramping.  She is still struggling from some swelling in her legs.  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 She has tried to wear stockings but has been unable to get them on or tolerate them. Patient has attempted exercise, elevation, and compression for four weeks without success. Her BNP recently was negative.  She does have difficulty walking and is requesting a handicap parking sticker.   Past Medical History:  Diagnosis Date   Acute diastolic (congestive) heart failure (HCC) 12/25/2015   Back pain    prior back surgery in 2009 - slow to recover   Cancer (HCC)    Diabetes (HCC)    Diverticulitis    Gastritis    Hemangioma of liver    managed conservatively; followed at [facility]   HLD (hyperlipidemia)    statin intolerant    Hypertension    Normal cardiac stress test 2011   Obesity    Thyroid disease    hypothyroidism    Past Surgical History:  Procedure Laterality Date   ABDOMINAL HYSTERECTOMY     APPENDECTOMY     LEFT HEART CATH AND CORONARY ANGIOGRAPHY N/A 02/11/2018   Procedure: LEFT HEART CATH AND CORONARY ANGIOGRAPHY;  Surgeon: Lennette Bihari, MD;  Location: [facility] INVASIVE CV LAB;  Service: Cardiovascular;  Laterality: N/A;   NASAL RECONSTRUCTION       Current Outpatient Medications  Medication Sig Dispense Refill   aspirin 81 MG tablet Take 81 mg by mouth daily.     clopidogrel (PLAVIX) 75 MG tablet Take 1 tablet (75 mg total) by mouth daily. 90 tablet 3   colesevelam (WELCHOL) 625 MG tablet Take  3 tablets (1,875 mg total) by mouth 2 (two) times daily as needed (cholesterol). 180 tablet 6   diclofenac sodium (VOLTAREN) 1 % GEL Apply 1 application topically at bedtime as needed (for pain).     Evolocumab 140 MG/ML SOAJ Inject 1 mL into the skin every 14 (fourteen) days. 2 mL 11   famotidine (PEPCID) 10 MG tablet Take 10 mg by mouth daily in the afternoon.     gabapentin (NEURONTIN) 100 MG capsule Take 3 capsules (300mg ) by mouth at night and take additional 1-3 capsules daily as needed for breakthrough. 180 capsule 5   Glucagon 0.5 MG/0.1ML SOAJ      HUMALOG KWIKPEN 100 UNIT/ML KiwkPen Inject 14 Units into the skin See admin instructions. Inject 11 units SQ at breakfast, inject 14 units SQ at lunch and inject 16 units SQ at supper. Plus sliding scale 1 unit for every 25 BS is > 120     Hyoscyamine Sulfate 0.375 MG TBCR Take 1 tablet every 12 hours by oral route.     levothyroxine (SYNTHROID, LEVOTHROID) 88 MCG tablet Take 88 mcg by mouth daily.      Liniments (SALONPAS EX) Apply 1 patch topically daily as needed (for pain).     losartan (COZAAR) 25 MG tablet Take 2 tablets (50 mg total) by mouth daily. 90 tablet 3   meclizine (ANTIVERT) 12.5 MG tablet Take 1 tablet (12.5 mg total) by mouth 3 (three)  times daily as needed for dizziness. 30 tablet 0   nitroGLYCERIN (NITROSTAT) 0.4 MG SL tablet Place 1 tablet (0.4 mg total) under the tongue every 5 (five) minutes as needed for chest pain. 25 tablet 2   ondansetron (ZOFRAN ODT) 4 MG disintegrating tablet Take 1 tablet (4 mg total) by mouth every 8 (eight) hours as needed for nausea or vomiting. 20 tablet 0   potassium chloride SA (KLOR-CON M) 20 MEQ tablet Take 1 tablet (20 mEq total) by mouth daily in the afternoon. 90 tablet 3   torsemide (DEMADEX) 20 MG tablet Take 10 mg by mouth daily as needed.     TOUJEO SOLOSTAR 300 UNIT/ML SOPN Inject 22 Units into the skin.      No current facility-administered medications for this visit.    Allergies:   Celecoxib, Lipitor [atorvastatin calcium], Zetia [ezetimibe], Ropinirole hcl, Actos [pioglitazone hydrochloride], Crestor [rosuvastatin calcium], Doxycycline, Duloxetine, Fenofibrate, Fenofibrate, Imdur [isosorbide nitrate], Isosorbide, Metformin and related, Metoprolol succinate [metoprolol], Niaspan [niacin], Pravachol, Pravachol [pravastatin], Ranolazine er, Ropinirole, Rosuvastatin, Niaspan [niacin er], and Ranolazine    Social History:  The patient  reports that she has never smoked. She has never used smokeless tobacco. She reports that she does not drink alcohol and does not use drugs.   Family History:  The patient's family history includes Dementia in her father; Heart attack in her brother and father; Heart disease in her brother and father; Hypertension in her mother; Stroke in her mother.    ROS:   Pertinent items noted in HPI and remainder of comprehensive ROS otherwise negative.  PHYSICAL EXAM: VS:  BP (!) 120/58   Pulse 83   Ht 5\' 3"  (1.6 m)   Wt 191 lb 6.4 oz (86.8 kg)   SpO2 98%   BMI 33.90 kg/m  , BMI Body mass index is 33.9 kg/m. General appearance: alert, no distress and moderately obese Neck: no carotid bruit and no JVD Lungs: diminished breath sounds bilaterally Heart:  regular rate and rhythm Abdomen: soft, non-tender; bowel sounds normal; no masses,  no organomegaly Extremities: edema 2+ pedal edema, notable bilateral LE hyperpigmentation, small varicosities and spider veins are noted, the edema is mildly pitting Pulses: 2+ and symmetric Skin: There is lower extremity hyperpigmentation/hemosiderin deposition Neurologic: Grossly normal Psych: Mildly anxious  EKG:   Normal sinus rhythm at 96, low voltage QRS--personally reviewed  Recent Labs: 02/21/2022: BNP 17.9; BUN 26; Creatinine, Ser 0.91; Potassium 4.6; Sodium 143    Lipid Panel    Component Value Date/Time   CHOL 129 01/10/2022 0835   TRIG 97 01/10/2022 0835   HDL 72 01/10/2022 0835   CHOLHDL 1.8 01/10/2022 0835   CHOLHDL 3.8 11/15/2015 0954   VLDL 20 11/15/2015 0954   LDLCALC 39 01/10/2022 0835   LDLDIRECT 148.7 09/14/2012 0857      Wt Readings from Last 3 Encounters:  10/29/22 191 lb 6.4 oz (86.8 kg)  08/26/22 192 lb 6.4 oz (87.3 kg)  02/14/22 212 lb (96.2 kg)     ASSESSMENT AND PLAN:  Syncope, possibly vasovagal Chronic diastolic congestive heart failure Hypertensive heart disease without heart failure Diabetes mellitus - on insulin Atypical chest pain with normal Myoview stress test in August 2016.  No ischemia.  Ejection fraction 85%. old CVA followed at [facility] History of dermatofibro- sarcoma of the spine. Dyslipidemia Osteoarthritis OSA on CPAP [facility]) Hypothyroidism followed at [facility]. GERD, followed by Dr. Randa Evens  RBBB Peripheral neuropathy Statin intolerance - on Repatha Lymphedema   PLAN:  Mrs. Roed unfortunately is grieving over the loss of her husband.  He was a patient of mine as well and will be missed.  She has been having some persistent swelling which is likely lymphedema, as her BNP has been low and she was noted to have had severe cramping with daily use of torsemide.  I advised her to decrease from 20 to 10 mg on that dose  and she could continue to use it as needed.  She would benefit from mobilization of her lower extremity edema but could not tolerate stocking wear.  Will look into SCD devices/lymphedema pumps for her.  She has attempted exercise, elevation, and compression for four weeks without benefit. Her lipids actually look excellent now on Repatha.  Labs in October showed an LDL of 48.  Follow-up in 6 months or sooner as necessary.  Chrystie Nose, MD, [facility], FACP    [facility]  Medical Director of the Advanced Lipid Disorders &  Cardiovascular Risk Reduction Clinic Diplomate of the American Board of Clinical Lipidology Attending Cardiologist  Direct Dial: [phone]  Fax: [phone]  Website:  [email or web address]   10/29/2022 3:20 PM

## 2022-10-29 NOTE — Patient Instructions (Signed)
Medication Instructions:  NO CHANGES  *If you need a refill on your cardiac medications before your next appointment, please call your pharmacy*    Testing/Procedures:  ZIO XT- Long Term Monitor Instructions  Your physician has requested you wear a ZIO patch monitor for 14 days.  This is a single patch monitor. [employer] supplies one patch monitor per enrollment. Additional stickers are not available. Please do not apply patch if you will be having a Nuclear Stress Test,  Echocardiogram, Cardiac CT, MRI, or Chest Xray during the period you would be wearing the  monitor. The patch cannot be worn during these tests. You cannot remove and re-apply the  ZIO XT patch monitor.  Your ZIO patch monitor will be mailed 3 day USPS to your address on file. It may take 3-5 days  to receive your monitor after you have been enrolled.  Once you have received your monitor, please review the enclosed instructions. Your monitor  has already been registered assigning a specific monitor serial # to you.  Billing and Patient Assistance Program Information  We have supplied [employer] with any of your insurance information on file for billing purposes. [employer] offers a sliding scale Patient Assistance Program for patients that do not have  insurance, or whose insurance does not completely cover the cost of the ZIO monitor.  You must apply for the Patient Assistance Program to qualify for this discounted rate.  To apply, please call [employer] at [phone], select option 4, select option 2, ask to apply for  Patient Assistance Program. Meredeth Ide will ask your household income, and how many people  are in your household. They will quote your out-of-pocket cost based on that information.  [employer] will also be able to set up a 44-month, interest-free payment plan if needed.  Applying the monitor   Shave hair from upper left chest.  Hold abrader disc by orange tab. Rub abrader in 40 strokes over the upper left  chest as  indicated in your monitor instructions.  Clean area with 4 enclosed alcohol pads. Let dry.  Apply patch as indicated in monitor instructions. Patch will be placed under collarbone on left  side of chest with arrow pointing upward.  Rub patch adhesive wings for 2 minutes. Remove white label marked "1". Remove the white  label marked "2". Rub patch adhesive wings for 2 additional minutes.  While looking in a mirror, press and release button in center of patch. A small green light will  flash 3-4 times. This will be your only indicator that the monitor has been turned on.  Do not shower for the first 24 hours. You may shower after the first 24 hours.  Press the button if you feel a symptom. You will hear a small click. Record Date, Time and  Symptom in the Patient Logbook.  When you are ready to remove the patch, follow instructions on the last 2 pages of Patient  Logbook. Stick patch monitor onto the last page of Patient Logbook.  Place Patient Logbook in the blue and white box. Use locking tab on box and tape box closed  securely. The blue and white box has prepaid postage on it. Please place it in the mailbox as  soon as possible. Your physician should have your test results approximately 7 days after the  monitor has been mailed back to [facility].  Call [facility] Customer Care at [phone] if you have questions regarding  your ZIO XT patch monitor. Call them immediately if you see  an orange light blinking on your  monitor.  If your monitor falls off in less than 4 days, contact our Monitor department at [phone].  If your monitor becomes loose or falls off after 4 days call [employer] at [phone] for  suggestions on securing your monitor    Dr. Rennis Golden has ordered a LOWER EXTREMITY VENOUS DOPPLER.    Follow-Up: At [facility], you and your health needs are our priority.  As part of our continuing mission to provide you with exceptional heart  care, we have created designated Provider Care Teams.  These Care Teams include your primary Cardiologist (physician) and Advanced Practice Providers (APPs -  Physician Assistants and Nurse Practitioners) who all work together to provide you with the care you need, when you need it.  We recommend signing up for the patient portal called "MyChart".  Sign up information is provided on this After Visit Summary.  MyChart is used to connect with patients for Virtual Visits (Telemedicine).  Patients are able to view lab/test results, encounter notes, upcoming appointments, etc.  Non-urgent messages can be sent to your provider as well.   To learn more about what you can do with MyChart, go to [email or web address].    Your next appointment:    As scheduled in September

## 2022-10-29 NOTE — Progress Notes (Unsigned)
Enrolled for [employer] to mail a ZIO XT long term holter monitor to the patients address on file.  

## 2022-11-02 DIAGNOSIS — R002 Palpitations: Secondary | ICD-10-CM | POA: Diagnosis not present

## 2022-11-02 DIAGNOSIS — R000 Tachycardia, unspecified: Secondary | ICD-10-CM

## 2022-11-14 ENCOUNTER — Ambulatory Visit (HOSPITAL_COMMUNITY)
Admission: RE | Admit: 2022-11-14 | Discharge: 2022-11-14 | Disposition: A | Discharge status: Home / Self Care | Payer: Medicare Other | Source: Ambulatory Visit | Attending: Cardiology | Admitting: Cardiology

## 2022-11-14 DIAGNOSIS — R600 Localized edema: Secondary | ICD-10-CM | POA: Diagnosis not present

## 2022-11-15 ENCOUNTER — Ambulatory Visit (INDEPENDENT_AMBULATORY_CARE_PROVIDER_SITE_OTHER): Payer: Medicare Other | Admitting: Physician Assistant

## 2022-11-15 ENCOUNTER — Encounter: Payer: Self-pay | Admitting: Physician Assistant

## 2022-11-15 VITALS — BP 125/74 | HR 75 | Temp 97.8°F | Resp 18 | Ht 63.0 in | Wt 189.7 lb

## 2022-11-15 DIAGNOSIS — I890 Lymphedema, not elsewhere classified: Secondary | ICD-10-CM

## 2022-11-15 DIAGNOSIS — I87009 Postthrombotic syndrome without complications of unspecified extremity: Secondary | ICD-10-CM | POA: Diagnosis not present

## 2022-11-15 NOTE — Progress Notes (Signed)
Office Note     CC:  follow up Requesting Provider:  Irven Coe, MD  HPI: Whitney Newton is a 82 y.o. (February 26, 1941) female who presents for evaluation of bilateral lower extremity edema.  Past medical history also significant for heart failure managed by Dr. Rennis Golden.  The patient complains of bilateral lower extremity edema left worse than right over the past several years.  She has had 3 episodes of cellulitis of her left lower leg requiring antibiotics.  She has never been hospitalized with cellulitis.  She denies any history of venous ulcerations, trauma, or prior vascular interventions.  She has had a DVT of the left leg in 2006.  She has also been diagnosed with lymphedema by Dr. Rennis Golden and is in the process of obtaining lymphedema pumps.  Her mobility is limited by osteoarthritis of her knees however she is able to walk with a walker.  She denies any claudication, rest pain, or tissue loss of bilateral feet.  She denies tobacco use.  She has tried compression in the past however is unable to get the socks on.  She tries her best to elevate her legs periodically during the day.  She also tries to avoid prolonged sitting and standing.   Past Medical History:  Diagnosis Date   Acute diastolic (congestive) heart failure (HCC) 12/25/2015   Back pain    prior back surgery in 2009 - slow to recover   Cancer (HCC)    Diabetes (HCC)    Diverticulitis    Gastritis    Hemangioma of liver    managed conservatively; followed at [facility]   HLD (hyperlipidemia)    statin intolerant   Hypertension    Normal cardiac stress test 2011   Obesity    Thyroid disease    hypothyroidism    Past Surgical History:  Procedure Laterality Date   ABDOMINAL HYSTERECTOMY     APPENDECTOMY     LEFT HEART CATH AND CORONARY ANGIOGRAPHY N/A 02/11/2018   Procedure: LEFT HEART CATH AND CORONARY ANGIOGRAPHY;  Surgeon: Lennette Bihari, MD;  Location: [facility] INVASIVE CV LAB;  Service: Cardiovascular;  Laterality: N/A;   NASAL  RECONSTRUCTION      Social History   Socioeconomic History   Marital status: Married    Spouse name: Not on file   Number of children: Not on file   Years of education: Not on file   Highest education level: Not on file  Occupational History   Not on file  Tobacco Use   Smoking status: Never   Smokeless tobacco: Never  Substance and Sexual Activity   Alcohol use: No   Drug use: No   Sexual activity: Never  Other Topics Concern   Not on file  Social History Narrative   Not on file   Social Determinants of Health   Financial Resource Strain: Not on file  Food Insecurity: Not on file  Transportation Needs: Not on file  Physical Activity: Not on file  Stress: Not on file  Social Connections: Not on file  Intimate Partner Violence: Not on file    Family History  Problem Relation Age of Onset   Stroke Mother    Hypertension Mother    Heart disease Father    Heart attack Father    Dementia Father    Heart disease Brother    Heart attack Brother     Current Outpatient Medications  Medication Sig Dispense Refill   aspirin 81 MG tablet Take 81 mg by mouth daily.  clopidogrel (PLAVIX) 75 MG tablet Take 1 tablet (75 mg total) by mouth daily. 90 tablet 3   colesevelam (WELCHOL) 625 MG tablet Take 3 tablets (1,875 mg total) by mouth 2 (two) times daily as needed (cholesterol). 180 tablet 6   diclofenac sodium (VOLTAREN) 1 % GEL Apply 1 application topically at bedtime as needed (for pain).     Evolocumab 140 MG/ML SOAJ Inject 1 mL into the skin every 14 (fourteen) days. 2 mL 11   famotidine (PEPCID) 10 MG tablet Take 10 mg by mouth daily in the afternoon.     gabapentin (NEURONTIN) 100 MG capsule Take 3 capsules (300mg ) by mouth at night and take additional 1-3 capsules daily as needed for breakthrough. 180 capsule 5   Glucagon 0.5 MG/0.1ML SOAJ      HUMALOG KWIKPEN 100 UNIT/ML KiwkPen Inject 14 Units into the skin See admin instructions. Inject 11 units SQ at breakfast,  inject 14 units SQ at lunch and inject 16 units SQ at supper. Plus sliding scale 1 unit for every 25 BS is > 120     Hyoscyamine Sulfate 0.375 MG TBCR Take 1 tablet every 12 hours by oral route.     levothyroxine (SYNTHROID, LEVOTHROID) 88 MCG tablet Take 88 mcg by mouth daily.      Liniments (SALONPAS EX) Apply 1 patch topically daily as needed (for pain).     losartan (COZAAR) 25 MG tablet Take 2 tablets (50 mg total) by mouth daily. 90 tablet 3   meclizine (ANTIVERT) 12.5 MG tablet Take 1 tablet (12.5 mg total) by mouth 3 (three) times daily as needed for dizziness. 30 tablet 0   nitroGLYCERIN (NITROSTAT) 0.4 MG SL tablet Place 1 tablet (0.4 mg total) under the tongue every 5 (five) minutes as needed for chest pain. 25 tablet 2   ondansetron (ZOFRAN ODT) 4 MG disintegrating tablet Take 1 tablet (4 mg total) by mouth every 8 (eight) hours as needed for nausea or vomiting. 20 tablet 0   potassium chloride SA (KLOR-CON M) 20 MEQ tablet Take 1 tablet (20 mEq total) by mouth daily in the afternoon. 90 tablet 3   torsemide (DEMADEX) 20 MG tablet Take 10 mg by mouth daily as needed.     TOUJEO SOLOSTAR 300 UNIT/ML SOPN Inject 22 Units into the skin.      No current facility-administered medications for this visit.    Allergies  Allergen Reactions   Celecoxib Shortness Of Breath   Lipitor [Atorvastatin Calcium] Shortness Of Breath and Other (See Comments)    CHEST PAIN   Zetia [Ezetimibe] Shortness Of Breath and Other (See Comments)    NO SLEEP   Ropinirole Hcl Other (See Comments)    Restless legs syndrome.   Actos [Pioglitazone Hydrochloride] Swelling   Crestor [Rosuvastatin Calcium] Other (See Comments)    LEG PAIN   Doxycycline Other (See Comments)    gastritis   Duloxetine    Fenofibrate Other (See Comments)    unknown   Fenofibrate    Imdur [Isosorbide Nitrate] Other (See Comments)    headache   Isosorbide    Metformin And Related Other (See Comments)    unknown   Metoprolol  Succinate [Metoprolol] Other (See Comments)    Joint pain   Niaspan [Niacin]    Pravachol Other (See Comments)    Leg pain   Pravachol [Pravastatin]    Ranolazine Er Other (See Comments)    EDEMA   Ropinirole    Rosuvastatin Other (See Comments)  LEG PAIN   Niaspan [Niacin Er] Rash   Ranolazine Other (See Comments)    EDEMA Pt can not remember     REVIEW OF SYSTEMS:   [X]  denotes positive finding, [ ]  denotes negative finding Cardiac  Comments:  Chest pain or chest pressure:    Shortness of breath upon exertion:    Short of breath when lying flat:    Irregular heart rhythm:        Vascular    Pain in calf, thigh, or hip brought on by ambulation:    Pain in feet at night that wakes you up from your sleep:     Blood clot in your veins:    Leg swelling:         Pulmonary    Oxygen at home:    Productive cough:     Wheezing:         Neurologic    Sudden weakness in arms or legs:     Sudden numbness in arms or legs:     Sudden onset of difficulty speaking or slurred speech:    Temporary loss of vision in one eye:     Problems with dizziness:         Gastrointestinal    Blood in stool:     Vomited blood:         Genitourinary    Burning when urinating:     Blood in urine:        Psychiatric    Major depression:         Hematologic    Bleeding problems:    Problems with blood clotting too easily:        Skin    Rashes or ulcers:        Constitutional    Fever or chills:      PHYSICAL EXAMINATION:  Vitals:   11/15/22 1059  BP: 125/74  Pulse: 75  Resp: 18  Temp: 97.8 F (36.6 C)  TempSrc: Temporal  SpO2: 97%  Weight: 189 lb 11.2 oz (86 kg)  Height: 5\' 3"  (1.6 m)    General:  WDWN in NAD; vital signs documented above Gait: Not observed HENT: WNL, normocephalic Pulmonary: normal non-labored breathing , without Rales, rhonchi,  wheezing Cardiac: regular HR Abdomen: soft, NT, no masses Skin: without rashes Vascular Exam/Pulses: Palpable DP  pulses bilaterally Extremities: Bilateral lower extremities without wounds; pigmentation changes of the left lower leg; edema affects both feet and toes; no weeping noted Musculoskeletal: no muscle wasting or atrophy  Neurologic: A&O X 3 Psychiatric:  The pt has Normal affect.   Non-Invasive Vascular Imaging:   Bilateral lower extremity reflux studies negative for DVTs.  Negative for deep and superficial venous reflux bilaterally    ASSESSMENT/PLAN:: 82 y.o. female here for evaluation of bilateral lower extremity edema left worse than right  -Ms. Whitney Newton is an 82 year old female who presents to the clinic for evaluation of bilateral lower extremity edema left worse than the right.  Recently she has been treated 3 separate times for cellulitis involving the left lower leg.  She has a history of a DVT of the left leg in 2006.  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  She has been unable to wear compression due to inability to get them on herself.  I recommended she use an Ace wrap and/or CircAid wraps.  She will also need to make a focused effort on elevating her legs above the level of her heart periodically throughout the day.  I encouraged her to increase her mobility to avoid prolonged sitting and standing.  Bilateral lower extremity reflux study was negative for DVT.  Study was also negative for deep or superficial venous reflux.  I do believe she would be a good candidate for lymphedema pumps and encouraged her to follow-up with Dr. Blanchie Dessert office.  Nothing further to add from a vascular surgery standpoint.  She will follow-up on an as-needed basis.   Emilie Rutter, PA-C [facility] [phone]  Clinic MD:   Karin Lieu

## 2022-11-17 ENCOUNTER — Other Ambulatory Visit: Payer: Self-pay | Admitting: Internal Medicine

## 2022-11-17 DIAGNOSIS — E785 Hyperlipidemia, unspecified: Secondary | ICD-10-CM

## 2022-11-17 DIAGNOSIS — E782 Mixed hyperlipidemia: Secondary | ICD-10-CM

## 2022-11-17 DIAGNOSIS — I2510 Atherosclerotic heart disease of native coronary artery without angina pectoris: Secondary | ICD-10-CM

## 2022-11-17 DIAGNOSIS — I700 Atherosclerosis of aorta: Secondary | ICD-10-CM

## 2022-11-18 MED ORDER — EVOLOCUMAB 140 MG/ML ~~LOC~~ SOAJ
1.0000 mL | SUBCUTANEOUS | 1 refills | Status: DC
Start: 2022-11-18 — End: 2022-11-22

## 2022-11-21 ENCOUNTER — Telehealth: Payer: Self-pay | Admitting: Internal Medicine

## 2022-11-21 DIAGNOSIS — I2510 Atherosclerotic heart disease of native coronary artery without angina pectoris: Secondary | ICD-10-CM

## 2022-11-21 DIAGNOSIS — E782 Mixed hyperlipidemia: Secondary | ICD-10-CM

## 2022-11-21 DIAGNOSIS — I700 Atherosclerosis of aorta: Secondary | ICD-10-CM

## 2022-11-21 DIAGNOSIS — E785 Hyperlipidemia, unspecified: Secondary | ICD-10-CM

## 2022-11-21 NOTE — Telephone Encounter (Signed)
Pt c/o medication issue:  1. Name of Medication: Repatha   2. How are you currently taking this medication (dosage and times per day)?   3. Are you having a reaction (difficulty breathing--STAT)?   4. What is your medication issue? Patient is calling stating that this medication has gone up on price and she would like a call back to discuss if there are other options or assistance she can get for this medication. Please advise.

## 2022-11-21 NOTE — Telephone Encounter (Signed)
Patient states co-pay was $47.  It is now over $230. Marland Kitchen She states she did patient assistance in the past but then it is now up again.  She ask if needs to reapply, or if there is another alternative.

## 2022-11-22 ENCOUNTER — Other Ambulatory Visit (HOSPITAL_COMMUNITY): Payer: Self-pay

## 2022-11-22 MED ORDER — EVOLOCUMAB 140 MG/ML ~~LOC~~ SOAJ
1.0000 mL | SUBCUTANEOUS | 3 refills | Status: DC
Start: 2022-11-22 — End: 2023-11-17
  Filled 2022-11-22 – 2022-11-27 (×2): qty 6, 84d supply, fill #0
  Filled 2023-02-04 (×2): qty 6, 84d supply, fill #1
  Filled 2023-05-09: qty 6, 84d supply, fill #2
  Filled 2023-08-06: qty 6, 84d supply, fill #3

## 2022-11-22 NOTE — Telephone Encounter (Signed)
Enrolled patient in [employer].  Spoke with patient. Unable to drive. Will set up delivery services with [facility] outpatient.  CARD NO. [id number]   CARD STATUS Active   BIN [id number]   PCN [id number]   PC GROUP [id number]

## 2022-11-25 ENCOUNTER — Encounter: Payer: Self-pay | Admitting: Internal Medicine

## 2022-11-25 DIAGNOSIS — R002 Palpitations: Secondary | ICD-10-CM | POA: Diagnosis not present

## 2022-11-25 DIAGNOSIS — R000 Tachycardia, unspecified: Secondary | ICD-10-CM | POA: Diagnosis not present

## 2022-11-26 ENCOUNTER — Other Ambulatory Visit (HOSPITAL_COMMUNITY): Payer: Self-pay

## 2022-11-26 NOTE — Telephone Encounter (Signed)
Looks like they backed it out?   So I did a test bill. Refill is too soon. It was filled on 11/19/22 at another pharmacy for a 84 day supply so [facility] can't fill. Insurance will pay the claim again on 01/21/23

## 2022-11-26 NOTE — Telephone Encounter (Signed)
Patient unable to drive. Needs to have Rx canceled at [facility] and run at [facility] with [facility]

## 2022-11-26 NOTE — Telephone Encounter (Signed)
Patient called back. Said she had not heard anything regarding med delivery yet. Can you check on this?

## 2022-11-27 ENCOUNTER — Other Ambulatory Visit (HOSPITAL_COMMUNITY): Payer: Self-pay

## 2022-11-27 NOTE — Telephone Encounter (Signed)
Routed request to the Outpatient call center. We do not handle any of the filling aspects of the pharmacy (filling transferring, delivery, etc.). We just have test claim capabilities and can see some things other [facility] pharmacies have done. Filling and production is a separate team from [name]. The patient needs to be involved in their shipment to avoid shipping errors and to provide payment info. Patients can call and set up delivery with [name] at [phone]. Someone is already working on this request.

## 2022-11-29 ENCOUNTER — Telehealth: Payer: Self-pay | Admitting: Internal Medicine

## 2022-11-29 NOTE — Telephone Encounter (Signed)
   Patient was measured on 09/12/22 as:  -Left ankle 30.5 cm  -Left knee 39.6 cm  -Left thigh 68.9 cm  -Right ankle 34.6 cm  -Right knee 43.2 cm  -Right thigh 72.8 cm  Patient has been doing daily elevation, exercise and compliance use of adequate compression for the past 4 weeks and there have been no improvements made.  Patient's chronic lymphedema and hyperpigmentation/hyperkeratosis still persist.  Is medically necessary that she receives a pneumatic compression pump.  Chrystie Nose, MD, [facility], FACP  [place]  [facility]  Medical Director of the Advanced Lipid Disorders &  Cardiovascular Risk Reduction Clinic Diplomate of the American Board of Clinical Lipidology Attending Cardiologist  Direct Dial: [phone]  Fax: [phone]  Website:  [email or web address]

## 2022-11-29 NOTE — Telephone Encounter (Signed)
Faxed this note to [employer] at [phone]

## 2022-12-05 DIAGNOSIS — E113311 Type 2 diabetes mellitus with moderate nonproliferative diabetic retinopathy with macular edema, right eye: Secondary | ICD-10-CM | POA: Diagnosis not present

## 2022-12-09 ENCOUNTER — Telehealth: Payer: Self-pay | Admitting: Internal Medicine

## 2022-12-09 NOTE — Telephone Encounter (Signed)
Lan with [employer] is following up. She states paperwork for a compression pump was faxed to the office on 6/25 and she would like to confirm whether it has been received. Please advise.  Phone#: [phone] (ext#: [phone]) Fax#: [phone]

## 2022-12-10 ENCOUNTER — Telehealth: Payer: Self-pay | Admitting: Internal Medicine

## 2022-12-10 DIAGNOSIS — L989 Disorder of the skin and subcutaneous tissue, unspecified: Secondary | ICD-10-CM | POA: Diagnosis not present

## 2022-12-10 DIAGNOSIS — I10 Essential (primary) hypertension: Secondary | ICD-10-CM | POA: Diagnosis not present

## 2022-12-10 DIAGNOSIS — I471 Supraventricular tachycardia, unspecified: Secondary | ICD-10-CM | POA: Diagnosis not present

## 2022-12-10 DIAGNOSIS — R609 Edema, unspecified: Secondary | ICD-10-CM | POA: Diagnosis not present

## 2022-12-10 DIAGNOSIS — L299 Pruritus, unspecified: Secondary | ICD-10-CM | POA: Diagnosis not present

## 2022-12-10 NOTE — Telephone Encounter (Signed)
Patient states she was measured for some type of compression boots a few months back, but she states she hasn't received them yet.  She also states she hasn't gotten her results to the heart monitor she wore for two weeks.

## 2022-12-10 NOTE — Telephone Encounter (Signed)
Left voicemail to return call to office.

## 2022-12-13 DIAGNOSIS — E1165 Type 2 diabetes mellitus with hyperglycemia: Secondary | ICD-10-CM | POA: Diagnosis not present

## 2022-12-13 DIAGNOSIS — E11649 Type 2 diabetes mellitus with hypoglycemia without coma: Secondary | ICD-10-CM | POA: Diagnosis not present

## 2022-12-13 DIAGNOSIS — Z79890 Hormone replacement therapy (postmenopausal): Secondary | ICD-10-CM | POA: Diagnosis not present

## 2022-12-13 DIAGNOSIS — E039 Hypothyroidism, unspecified: Secondary | ICD-10-CM | POA: Diagnosis not present

## 2022-12-13 DIAGNOSIS — Z8719 Personal history of other diseases of the digestive system: Secondary | ICD-10-CM | POA: Diagnosis not present

## 2022-12-13 DIAGNOSIS — Z794 Long term (current) use of insulin: Secondary | ICD-10-CM | POA: Diagnosis not present

## 2022-12-13 NOTE — Telephone Encounter (Signed)
Call again to company and received VM again.

## 2022-12-13 NOTE — Telephone Encounter (Signed)
Faxed paperwork to [employer]

## 2022-12-13 NOTE — Telephone Encounter (Signed)
Call to [employer] at [phone]. LM on confidential central VM to get status of patient order.  That was sent 6/21

## 2022-12-16 NOTE — Telephone Encounter (Signed)
Pt is returning call for the nurse.   She states that Dr. Lewie Chamber, her PCP, printed the report from Dr. Rennis Golden for the pt and put her on diltiazem 120mg , based on the report. She would like to speak with someone about the medication and she feels like it has serious side affects. Please c/b.

## 2022-12-16 NOTE — Telephone Encounter (Signed)
Patient states she would like more information regarding the results of her monitor.  She had an appt in September and she was moved to November.  She would like a sooner appt.  Only available is virtual at 8am which would be difficult for her.  Please advise.

## 2022-12-16 NOTE — Telephone Encounter (Signed)
Paperwork faxed to [employer] on 7/5  Another message was left for patient about monitor Results were sent to MyChart several weeks ago by another nurse   Lindell Spar, RN 12/16/2022  3:55 PM EDT     Patient has not checked MyChart message LM on [phone] (Mobile) *Preferred*   Darene Lamer, LPN 09/16/8117 14:78 AM EDT     Mychart 06/20   Chrystie Nose, MD 11/27/2022  7:42 PM EDT     Monitor shows she is having PSVT episodes which are fast and could cause pre-syncope/syncope - cannot take metoprolol due to intolerance. Would advise starting cardizem CD/LA 120 mg daily.   Dr. Rexene Edison

## 2022-12-16 NOTE — Telephone Encounter (Signed)
Call to office again.  Spoke with reception. And need to speak with rep but she is on the phone.  LM to please call me at triage.

## 2022-12-17 DIAGNOSIS — M5414 Radiculopathy, thoracic region: Secondary | ICD-10-CM | POA: Diagnosis not present

## 2022-12-17 DIAGNOSIS — Z7902 Long term (current) use of antithrombotics/antiplatelets: Secondary | ICD-10-CM | POA: Diagnosis not present

## 2022-12-17 DIAGNOSIS — Z7409 Other reduced mobility: Secondary | ICD-10-CM | POA: Diagnosis not present

## 2022-12-17 DIAGNOSIS — I6603 Occlusion and stenosis of bilateral middle cerebral arteries: Secondary | ICD-10-CM | POA: Diagnosis not present

## 2022-12-17 DIAGNOSIS — I771 Stricture of artery: Secondary | ICD-10-CM | POA: Diagnosis not present

## 2022-12-17 DIAGNOSIS — G5602 Carpal tunnel syndrome, left upper limb: Secondary | ICD-10-CM | POA: Diagnosis not present

## 2022-12-17 DIAGNOSIS — R42 Dizziness and giddiness: Secondary | ICD-10-CM | POA: Diagnosis not present

## 2022-12-17 DIAGNOSIS — Z8673 Personal history of transient ischemic attack (TIA), and cerebral infarction without residual deficits: Secondary | ICD-10-CM | POA: Diagnosis not present

## 2022-12-17 DIAGNOSIS — R531 Weakness: Secondary | ICD-10-CM | POA: Diagnosis not present

## 2022-12-17 DIAGNOSIS — Z79899 Other long term (current) drug therapy: Secondary | ICD-10-CM | POA: Diagnosis not present

## 2022-12-17 DIAGNOSIS — I6601 Occlusion and stenosis of right middle cerebral artery: Secondary | ICD-10-CM | POA: Diagnosis not present

## 2022-12-17 DIAGNOSIS — M5136 Other intervertebral disc degeneration, lumbar region: Secondary | ICD-10-CM | POA: Diagnosis not present

## 2022-12-18 MED ORDER — DILTIAZEM HCL ER COATED BEADS 120 MG PO CP24: 120.0000 mg | ORAL_CAPSULE | Freq: Every day | ORAL | 5 refills | Status: DC

## 2022-12-18 NOTE — Telephone Encounter (Signed)
Whitney Nose, MD  Cv Div Nl Triage2 days ago    Called [name] - I answered some of her questions and she will continue on the diltiazem. She has her compression stockings coming soon. She feels somewhat drained with the torsemide and is instructed to use it as needed.  Dr. Rennis Golden

## 2022-12-18 NOTE — Telephone Encounter (Signed)
Rx sent to pharmacy   

## 2022-12-18 NOTE — Addendum Note (Signed)
Addended by: Lindell Spar on: 12/18/2022 01:31 PM   Modules accepted: Orders

## 2022-12-26 ENCOUNTER — Ambulatory Visit: Payer: Medicare Other | Admitting: Podiatry

## 2022-12-26 ENCOUNTER — Encounter: Payer: Self-pay | Admitting: Podiatry

## 2022-12-26 VITALS — BP 158/57 | HR 69

## 2022-12-26 DIAGNOSIS — E1142 Type 2 diabetes mellitus with diabetic polyneuropathy: Secondary | ICD-10-CM

## 2022-12-26 DIAGNOSIS — M79674 Pain in right toe(s): Secondary | ICD-10-CM | POA: Diagnosis not present

## 2022-12-26 DIAGNOSIS — B351 Tinea unguium: Secondary | ICD-10-CM

## 2022-12-26 DIAGNOSIS — M79675 Pain in left toe(s): Secondary | ICD-10-CM

## 2022-12-26 NOTE — Progress Notes (Signed)
  Subjective:  Patient ID: Whitney Newton, female    DOB: 05-Feb-1941,  MRN: [id number]  Chief Complaint  Patient presents with   Diabetes    "He's supposed to clip my nails.  One of my nails split and it's sort of painful."    82 y.o. female presents with the above complaint. History confirmed with patient. Patient presenting with pain related to dystrophic thickened elongated nails. Patient is unable to trim own nails related to nail dystrophy and/or mobility issues. Patient does  have a history of T2DM.   Objective:  Physical Exam: warm, good capillary refill nail exam onychomycosis of the toenails, onycholysis, and dystrophic nails DP pulses palpable, PT pulses palpable, and protective sensation absent Left Foot:  Pain with palpation of nails due to elongation and dystrophic growth.  Right Foot: Pain with palpation of nails due to elongation and dystrophic growth.   Assessment:   1. Pain due to onychomycosis of toenails of both feet   2. DM type 2 with diabetic peripheral neuropathy (HCC)      Plan:  Patient was evaluated and treated and all questions answered.   #Onychomycosis with pain  -Nails palliatively debrided as below. -Educated on self-care  Procedure: Nail Debridement Rationale: Pain Type of Debridement: manual, sharp debridement. Instrumentation: Nail nipper, rotary burr. Number of Nails: 10  Return in about 3 months (around 03/28/2023) for [facility].         Corinna Gab, DPM [facility]

## 2023-01-08 DIAGNOSIS — E113311 Type 2 diabetes mellitus with moderate nonproliferative diabetic retinopathy with macular edema, right eye: Secondary | ICD-10-CM | POA: Diagnosis not present

## 2023-01-28 ENCOUNTER — Emergency Department (HOSPITAL_COMMUNITY): Payer: Medicare Other

## 2023-01-28 ENCOUNTER — Other Ambulatory Visit: Payer: Self-pay

## 2023-01-28 ENCOUNTER — Encounter (HOSPITAL_COMMUNITY): Payer: Self-pay

## 2023-01-28 ENCOUNTER — Observation Stay (HOSPITAL_COMMUNITY)
Admission: EM | Admit: 2023-01-28 | Discharge: 2023-01-29 | Disposition: A | Discharge status: Home / Self Care | Payer: Medicare Other | Attending: Emergency Medicine | Admitting: Emergency Medicine

## 2023-01-28 DIAGNOSIS — Z7902 Long term (current) use of antithrombotics/antiplatelets: Secondary | ICD-10-CM | POA: Insufficient documentation

## 2023-01-28 DIAGNOSIS — R5383 Other fatigue: Secondary | ICD-10-CM | POA: Diagnosis not present

## 2023-01-28 DIAGNOSIS — E1169 Type 2 diabetes mellitus with other specified complication: Secondary | ICD-10-CM | POA: Diagnosis present

## 2023-01-28 DIAGNOSIS — E039 Hypothyroidism, unspecified: Secondary | ICD-10-CM | POA: Diagnosis not present

## 2023-01-28 DIAGNOSIS — Z7982 Long term (current) use of aspirin: Secondary | ICD-10-CM | POA: Insufficient documentation

## 2023-01-28 DIAGNOSIS — I672 Cerebral atherosclerosis: Secondary | ICD-10-CM | POA: Diagnosis not present

## 2023-01-28 DIAGNOSIS — N179 Acute kidney failure, unspecified: Secondary | ICD-10-CM | POA: Diagnosis not present

## 2023-01-28 DIAGNOSIS — I10 Essential (primary) hypertension: Secondary | ICD-10-CM | POA: Diagnosis present

## 2023-01-28 DIAGNOSIS — I110 Hypertensive heart disease with heart failure: Secondary | ICD-10-CM | POA: Insufficient documentation

## 2023-01-28 DIAGNOSIS — E119 Type 2 diabetes mellitus without complications: Secondary | ICD-10-CM

## 2023-01-28 DIAGNOSIS — I5031 Acute diastolic (congestive) heart failure: Secondary | ICD-10-CM | POA: Diagnosis not present

## 2023-01-28 DIAGNOSIS — R29810 Facial weakness: Secondary | ICD-10-CM | POA: Diagnosis not present

## 2023-01-28 DIAGNOSIS — R2990 Unspecified symptoms and signs involving the nervous system: Secondary | ICD-10-CM

## 2023-01-28 DIAGNOSIS — I959 Hypotension, unspecified: Secondary | ICD-10-CM | POA: Diagnosis not present

## 2023-01-28 DIAGNOSIS — Z8673 Personal history of transient ischemic attack (TIA), and cerebral infarction without residual deficits: Secondary | ICD-10-CM | POA: Diagnosis not present

## 2023-01-28 DIAGNOSIS — Z79899 Other long term (current) drug therapy: Secondary | ICD-10-CM | POA: Diagnosis not present

## 2023-01-28 DIAGNOSIS — R4781 Slurred speech: Secondary | ICD-10-CM | POA: Diagnosis not present

## 2023-01-28 DIAGNOSIS — R531 Weakness: Secondary | ICD-10-CM | POA: Diagnosis not present

## 2023-01-28 DIAGNOSIS — G319 Degenerative disease of nervous system, unspecified: Secondary | ICD-10-CM | POA: Diagnosis not present

## 2023-01-28 DIAGNOSIS — I6601 Occlusion and stenosis of right middle cerebral artery: Secondary | ICD-10-CM

## 2023-01-28 LAB — URINALYSIS, ROUTINE W REFLEX MICROSCOPIC
Glucose, UA: 150 mg/dL — AB
Hgb urine dipstick: NEGATIVE
Ketones, ur: 5 mg/dL — AB
Nitrite: NEGATIVE
Protein, ur: NEGATIVE mg/dL
Specific Gravity, Urine: 1.025 (ref 1.005–1.030)
pH: 5 (ref 5.0–8.0)

## 2023-01-28 LAB — I-STAT CHEM 8, ED
BUN: 24 mg/dL — ABNORMAL HIGH (ref 8–23)
Calcium, Ion: 1.29 mmol/L (ref 1.15–1.40)
Chloride: 106 mmol/L (ref 98–111)
Creatinine, Ser: 1.3 mg/dL — ABNORMAL HIGH (ref 0.44–1.00)
Glucose, Bld: 134 mg/dL — ABNORMAL HIGH (ref 70–99)
HCT: 36 % (ref 36.0–46.0)
Hemoglobin: 12.2 g/dL (ref 12.0–15.0)
Potassium: 3.9 mmol/L (ref 3.5–5.1)
Sodium: 141 mmol/L (ref 135–145)
TCO2: 24 mmol/L (ref 22–32)

## 2023-01-28 LAB — DIFFERENTIAL
Abs Immature Granulocytes: 0.05 10*3/uL (ref 0.00–0.07)
Basophils Absolute: 0 10*3/uL (ref 0.0–0.1)
Basophils Relative: 1 %
Eosinophils Absolute: 0.1 10*3/uL (ref 0.0–0.5)
Eosinophils Relative: 1 %
Immature Granulocytes: 1 %
Lymphocytes Relative: 29 %
Lymphs Abs: 2.5 10*3/uL (ref 0.7–4.0)
Monocytes Absolute: 0.6 10*3/uL (ref 0.1–1.0)
Monocytes Relative: 7 %
Neutro Abs: 5.5 10*3/uL (ref 1.7–7.7)
Neutrophils Relative %: 61 %

## 2023-01-28 LAB — CBC
HCT: 38 % (ref 36.0–46.0)
Hemoglobin: 13 g/dL (ref 12.0–15.0)
MCH: 32.4 pg (ref 26.0–34.0)
MCHC: 34.2 g/dL (ref 30.0–36.0)
MCV: 94.8 fL (ref 80.0–100.0)
Platelets: 304 10*3/uL (ref 150–400)
RBC: 4.01 MIL/uL (ref 3.87–5.11)
RDW: 15.9 % — ABNORMAL HIGH (ref 11.5–15.5)
WBC: 8.8 10*3/uL (ref 4.0–10.5)
nRBC: 0 % (ref 0.0–0.2)

## 2023-01-28 LAB — COMPREHENSIVE METABOLIC PANEL
ALT: 16 U/L (ref 0–44)
AST: 18 U/L (ref 15–41)
Albumin: 4.1 g/dL (ref 3.5–5.0)
Alkaline Phosphatase: 84 U/L (ref 38–126)
Anion gap: 13 (ref 5–15)
BUN: 22 mg/dL (ref 8–23)
CO2: 23 mmol/L (ref 22–32)
Calcium: 10 mg/dL (ref 8.9–10.3)
Chloride: 105 mmol/L (ref 98–111)
Creatinine, Ser: 1.3 mg/dL — ABNORMAL HIGH (ref 0.44–1.00)
GFR, Estimated: 41 mL/min — ABNORMAL LOW (ref >60)
Glucose, Bld: 139 mg/dL — ABNORMAL HIGH (ref 70–99)
Potassium: 3.8 mmol/L (ref 3.5–5.1)
Sodium: 141 mmol/L (ref 135–145)
Total Bilirubin: 0.8 mg/dL (ref 0.3–1.2)
Total Protein: 6.8 g/dL (ref 6.5–8.1)

## 2023-01-28 LAB — PROTIME-INR
INR: 1 (ref 0.8–1.2)
Prothrombin Time: 13.4 seconds (ref 11.4–15.2)

## 2023-01-28 LAB — APTT: aPTT: 22 seconds — ABNORMAL LOW (ref 24–36)

## 2023-01-28 LAB — ETHANOL: Alcohol, Ethyl (B): <10 mg/dL (ref <10)

## 2023-01-28 LAB — CBG MONITORING, ED
Glucose-Capillary: 155 mg/dL — ABNORMAL HIGH (ref 70–99)
Glucose-Capillary: 99 mg/dL (ref 70–99)
Glucose-Capillary: 109 mg/dL — ABNORMAL HIGH (ref 70–99)

## 2023-01-28 LAB — RAPID URINE DRUG SCREEN, HOSP PERFORMED
Amphetamines: NOT DETECTED
Barbiturates: NOT DETECTED
Benzodiazepines: NOT DETECTED
Cocaine: NOT DETECTED
Opiates: NOT DETECTED
Tetrahydrocannabinol: NOT DETECTED

## 2023-01-28 MED ORDER — LORAZEPAM 1 MG PO TABS: 1.0000 mg | ORAL_TABLET | ORAL | Status: DC | PRN

## 2023-01-28 MED ORDER — LORAZEPAM 2 MG/ML IJ SOLN
1.0000 mg | Freq: Once | INTRAMUSCULAR | Status: AC
  Administered 2023-01-28: 1 mg via INTRAVENOUS
  Filled 2023-01-28: qty 1

## 2023-01-28 MED ORDER — LACTATED RINGERS IV BOLUS
1000.0000 mL | Freq: Once | INTRAVENOUS | Status: AC
  Administered 2023-01-29: 1000 mL via INTRAVENOUS

## 2023-01-28 NOTE — Consult Note (Addendum)
Neurology Consultation  Reason for Consult: Code stroke  Referring Physician: Dr. Karene Fry   CC: Left facial droop, left sided flaccid weakness and slurred speech   History is obtained from:family, patient and EMS   HPI: Whitney Newton is an 82 y.o. 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 Code stroke activated by EMS. LKW 1430. On EMS arrival she was noted to be hypotensive with SBP in the 80's, left side flaccid with contracted hand, left facial droop and slurred speech. CBG was 155. On arrival to [facility] ED symptoms were significantly improved in parallel with improved SBP to the low 100's. Family in the ED reports that she still is not back to baseline, still with left facial droop and slurred speech. Code stroke CT head with no acute process, Aspects 10. NIHSS 7. Per chart review she had carotid US and TCD on 7/9 which revealed Increased velocity suggesting moderate stenosis or spasm in the right MCA, with mild stenosis in the right ACA and PCA, as well as the left MCA and ACA.   LKW: 1430 IV thrombolysis given?: no, symptoms too mild to treat  EVT:  No, presentation not consistent with LVO Premorbid modified Rankin scale (mRS):  3-Moderate disability-requires help but walks WITHOUT assistance  ROS: Full ROS was performed and is negative except as noted in the HPI.   Past Medical History:  Diagnosis Date   Acute diastolic (congestive) heart failure (HCC) 12/25/2015   Back pain    prior back surgery in 2009 - slow to recover   Cancer (HCC)    Diabetes (HCC)    Diverticulitis    Gastritis    Hemangioma of liver    managed conservatively; followed at [facility]   HLD (hyperlipidemia)    statin  intolerant   Hypertension    Normal cardiac stress test 2011   Obesity    Thyroid disease    hypothyroidism     Family History  Problem Relation Age of Onset   Stroke Mother    Hypertension Mother    Heart disease Father    Heart attack Father    Dementia Father    Heart disease Brother    Heart attack Brother      Social History:   reports that she has never smoked. She has never used smokeless tobacco. She reports that she does not drink alcohol and does not use drugs.  Medications No current facility-administered medications for this encounter.  Current Outpatient Medications:    aspirin 81 MG tablet, Take 81 mg by mouth daily., Disp: , Rfl:    clopidogrel (PLAVIX) 75 MG tablet, Take 1 tablet (75 mg total) by mouth daily., Disp: 90 tablet, Rfl: 3   colesevelam (WELCHOL) 625 MG tablet, Take 3 tablets (1,875 mg total) by mouth 2 (two) times daily as needed (cholesterol)., Disp: 180 tablet, Rfl: 6   diclofenac sodium (VOLTAREN) 1 % GEL, Apply 1 application topically at bedtime as needed (for pain)., Disp: , Rfl:    diltiazem (CARDIZEM CD) 120 MG 24 hr capsule, Take 1 capsule (120 mg total) by mouth daily., Disp: 30 capsule, Rfl: 5   Evolocumab 140 MG/ML SOAJ, Inject 140 mg into the skin every  14 (fourteen) days., Disp: 6 mL, Rfl: 3   famotidine (PEPCID) 10 MG tablet, Take 10 mg by mouth daily in the afternoon., Disp: , Rfl:    gabapentin (NEURONTIN) 100 MG capsule, Take 3 capsules (300mg ) by mouth at night and take additional 1-3 capsules daily as needed for breakthrough., Disp: 180 capsule, Rfl: 5   Glucagon 0.5 MG/0.1ML SOAJ, , Disp: , Rfl:    HUMALOG KWIKPEN 100 UNIT/ML KiwkPen, Inject 14 Units into the skin See admin instructions. Inject 11 units SQ at breakfast, inject 14 units SQ at lunch and inject 16 units SQ at supper. Plus sliding scale 1 unit for every 25 BS is > 120, Disp: , Rfl:    Hyoscyamine Sulfate 0.375 MG TBCR, Take 1 tablet every 12 hours by oral route., Disp:  , Rfl:    levothyroxine (SYNTHROID, LEVOTHROID) 88 MCG tablet, Take 88 mcg by mouth daily. , Disp: , Rfl:    Liniments (SALONPAS EX), Apply 1 patch topically daily as needed (for pain)., Disp: , Rfl:    losartan (COZAAR) 25 MG tablet, Take 2 tablets (50 mg total) by mouth daily., Disp: 90 tablet, Rfl: 3   meclizine (ANTIVERT) 12.5 MG tablet, Take 1 tablet (12.5 mg total) by mouth 3 (three) times daily as needed for dizziness., Disp: 30 tablet, Rfl: 0   nitroGLYCERIN (NITROSTAT) 0.4 MG SL tablet, Place 1 tablet (0.4 mg total) under the tongue every 5 (five) minutes as needed for chest pain., Disp: 25 tablet, Rfl: 2   ondansetron (ZOFRAN ODT) 4 MG disintegrating tablet, Take 1 tablet (4 mg total) by mouth every 8 (eight) hours as needed for nausea or vomiting., Disp: 20 tablet, Rfl: 0   potassium chloride SA (KLOR-CON M) 20 MEQ tablet, Take 1 tablet (20 mEq total) by mouth daily in the afternoon., Disp: 90 tablet, Rfl: 3   torsemide (DEMADEX) 20 MG tablet, Take 10 mg by mouth daily as needed., Disp: , Rfl:    TOUJEO SOLOSTAR 300 UNIT/ML SOPN, Inject 22 Units into the skin. , Disp: , Rfl:    Exam: Current vital signs: BP (!) 125/90   Pulse (!) 57   Temp 97.7 F (36.5 C) (Oral)   Resp 16   Wt 91.4 kg   SpO2 100%   BMI 35.69 kg/m  Vital signs in last 24 hours: Temp:  [97.7 F (36.5 C)] 97.7 F (36.5 C) (08/20 1759) Pulse Rate:  [57-61] 57 (08/20 1800) Resp:  [15-16] 16 (08/20 1800) BP: (121-143)/(44-90) 125/90 (08/20 1800) SpO2:  [100 %] 100 % (08/20 1800) Weight:  [91.4 kg] 91.4 kg (08/20 1700)  GENERAL: Awake, alert in NAD HEENT: - Normocephalic and atraumatic, dry mm LUNGS - Clear to auscultation bilaterally with no wheezes CV - S1S2 RRR, no m/r/g, equal pulses bilaterally. ABDOMEN - Soft, nontender, nondistended with normoactive BS Ext: warm, well perfused, intact peripheral pulses, bilateral lower leg edema  NEURO:  Mental Status: AA&Ox3  Language: speech is dysarthric.   Naming, repetition, fluency, and comprehension intact. Cranial Nerves: PERRL EOMI, visual fields full, slight left facial asymmetry, facial sensation intact, hearing intact, tongue/uvula/soft palate midline, normal sternocleidomastoid and trapezius muscle strength. No evidence of tongue atrophy  Motor: LUE 4/5, RUE 5/5 Bilateral lowers 2/5 (old due to thoracic injury) Tone is normal and bulk is normal Sensation- Intact to light touch bilaterally Coordination: FTN intact bilaterally, no ataxia in BLE. Gait- Deferred  NIHSS 1a Level of Conscious.: 0 1b LOC Questions: 0 1c LOC Commands: 0 2 Best  Gaze: 0 3 Visual: 0 4 Facial Palsy: 1 5a Motor Arm - left: 0 5b Motor Arm - Right: 0 6a Motor Leg - Left: 3 6b Motor Leg - Right: 3 7 Limb Ataxia: 0 8 Sensory: 0 9 Best Language: 0 10 Dysarthria: 0 11 Extinct. and Inatten.: 0 TOTAL: 7   Labs I have reviewed labs in epic and the results pertinent to this consultation are: 7 CBC    Component Value Date/Time   WBC 8.8 01/28/2023 1719   RBC 4.01 01/28/2023 1719   HGB 12.2 01/28/2023 1730   HGB 15.1 02/10/2018 1053   HCT 36.0 01/28/2023 1730   HCT 42.8 02/10/2018 1053   PLT 304 01/28/2023 1719   PLT 289 02/10/2018 1053   MCV 94.8 01/28/2023 1719   MCV 89 02/10/2018 1053   MCH 32.4 01/28/2023 1719   MCHC 34.2 01/28/2023 1719   RDW 15.9 (H) 01/28/2023 1719   RDW 13.8 02/10/2018 1053   LYMPHSABS 2.5 01/28/2023 1719   MONOABS 0.6 01/28/2023 1719   EOSABS 0.1 01/28/2023 1719   BASOSABS 0.0 01/28/2023 1719    CMP     Component Value Date/Time   NA 141 01/28/2023 1730   NA 143 02/21/2022 1224   K 3.9 01/28/2023 1730   CL 106 01/28/2023 1730   CO2 23 01/28/2023 1719   GLUCOSE 134 (H) 01/28/2023 1730   BUN 24 (H) 01/28/2023 1730   BUN 26 02/21/2022 1224   CREATININE 1.30 (H) 01/28/2023 1730   CREATININE 0.77 08/29/2016 1205   CALCIUM 10.0 01/28/2023 1719   PROT 6.8 01/28/2023 1719   ALBUMIN 4.1 01/28/2023 1719   AST 18  01/28/2023 1719   ALT 16 01/28/2023 1719   ALKPHOS 84 01/28/2023 1719   BILITOT 0.8 01/28/2023 1719   GFRNONAA 41 (L) 01/28/2023 1719   GFRAA 75 04/19/2020 1123    Lipid Panel     Component Value Date/Time   CHOL 129 01/10/2022 0835   TRIG 97 01/10/2022 0835   HDL 72 01/10/2022 0835   CHOLHDL 1.8 01/10/2022 0835   CHOLHDL 3.8 11/15/2015 0954   VLDL 20 11/15/2015 0954   LDLCALC 39 01/10/2022 0835   LDLDIRECT 148.7 09/14/2012 0857    Lab Results  Component Value Date   HGBA1C 8.2 (H) 02/04/2014      Imaging I have reviewed the images obtained:  CT head: No acute process. Aspects 10  Assessment: 82 y.o. female with past medical history of right MCA CVA with residual left side weakness, known right MCA stenosis followed by [facility] on ASA/plavix, DM, CHF, HTN, HLD, hypothyroidism, cancer who presents to [facility] ED via EMS for acute onset of left sided weakness, left facial droop, and slurred speech. Code stroke activated by EMS. LKW 1430. On EMS arrival she was noted to be hypotensive with SBP in the 80's, left side flaccid with contracted hand, left facial droop and slurred speech. CBG was 155. On arrival to [facility] ED deficits were significantly improved in parallel with improved SBP to the low 100's - Neurological exam with dysarthria, left facial droop, mild LUE weakness and BLE 2/5 strength in the context of old thoracic injury.  - CT head: No acute abnormality - Elevated BUN and Cr relative to baseline labs in September of 2023, consistent with AKI, possibly prerenal - Per chart review she had carotid US and TCD on 7/9 which revealed Increased velocity suggesting moderate stenosis or spasm in the right MCA, with mild stenosis in the right ACA  and PCA, as well as the left MCA and ACA.  - Overall impression: Stroke like symptoms most likely related to severe transient hypotension in the context of known intracranial arterial stenoses  Recommendations: - MRI brain w/o to evaluate  for stroke. If positive will continue with full stroke workup - IVF for hypotension. SBP goal of > 120. If HTN develops, treat if SBP > 180 - Evaluate for infection  - Stroke Team to follow in the AM  Gevena Mart DNP, ACNPC-AG  [facility]  I have seen and examined the patient. I have formulated the assessment and recommendations.  82 y.o. female with past medical history of right MCA CVA with residual left side weakness, known right MCA stenosis followed by [facility] on ASA/plavix, DM, CHF, HTN, HLD, hypothyroidism, cancer who presents to [facility] ED via EMS for acute onset of left sided weakness, left facial droop, and slurred speech in the context of severe hypotension. On arrival to [facility] ED deficits were significantly improved in parallel with improved SBP to the low 100's. Neurological exam with dysarthria, left facial droop, mild LUE weakness and BLE 2/5 strength in the context of old thoracic injury. CT head: No acute abnormality. Overall presentation is most consistent with watershed stroke versus TIA most likely related to severe transient hypotension in the context of known intracranial arterial stenoses. Electronically signed: Dr. Caryl Pina

## 2023-01-28 NOTE — ED Provider Notes (Signed)
[facility] DEPARTMENT AT [facility] Provider Note   CSN: [id number] Arrival date & time: 01/28/23  1717  An emergency department physician performed an initial assessment on this suspected stroke patient at 1720.  History  Chief Complaint  Patient presents with   Code Stroke    Whitney Newton is a 82 y.o. female.  HPI   82 year old female presenting to the emergency department as a code stroke.  The patient was reportedly last seen normal at around 1430 and was found by her daughter at 4 with left-sided weakness, left-sided facial droop with associated lethargy and slurred speech.  Her blood pressure was notably of 88/50 and improved to 108/70 without intervention and route with EMS.  CBG was 190.  The patient has known MCA stenosis and follows outpatient with neurology at [facility].  On arrival, code stroke had been activated per EMS report, neurology was bedside evaluating the patient on the bridge and the patient's airway was cleared for CT imaging.  Home Medications Prior to Admission medications   Medication Sig Start Date End Date Taking? Authorizing Provider  levothyroxine (SYNTHROID, LEVOTHROID) 88 MCG tablet Take 88 mcg by mouth daily.  07/28/14  Yes [provider]  losartan (COZAAR) 25 MG tablet Take 2 tablets (50 mg total) by mouth daily. 08/19/22  Yes Hilty, Lisette Abu, MD  TOUJEO SOLOSTAR 300 UNIT/ML SOPN Inject 9 Units into the skin. 02/19/19  Yes [provider]  aspirin 81 MG tablet Take 81 mg by mouth daily.    [provider]  clopidogrel (PLAVIX) 75 MG tablet Take 1 tablet (75 mg total) by mouth daily. 05/27/22   Hilty, Lisette Abu, MD  colesevelam (WELCHOL) 625 MG tablet Take 3 tablets (1,875 mg total) by mouth 2 (two) times daily as needed (cholesterol). 02/15/15   Cassell Clement, MD  diclofenac sodium (VOLTAREN) 1 % GEL Apply 1 application topically at bedtime as needed (for pain).    [provider]   diltiazem (CARDIZEM CD) 120 MG 24 hr capsule Take 1 capsule (120 mg total) by mouth daily. 12/18/22   Hilty, Lisette Abu, MD  Evolocumab 140 MG/ML SOAJ Inject 140 mg into the skin every 14 (fourteen) days. 11/22/22   Hilty, Lisette Abu, MD  famotidine (PEPCID) 10 MG tablet Take 10 mg by mouth daily in the afternoon.    [provider]  gabapentin (NEURONTIN) 100 MG capsule Take 3 capsules (300mg ) by mouth at night and take additional 1-3 capsules daily as needed for breakthrough. 07/03/22   Hilty, Lisette Abu, MD  Glucagon 0.5 MG/0.1ML SOAJ     [provider]  HUMALOG KWIKPEN 100 UNIT/ML KiwkPen Inject 14 Units into the skin See admin instructions. Inject 11 units SQ at breakfast, inject 14 units SQ at lunch and inject 16 units SQ at supper. Plus sliding scale 1 unit for every 25 BS is > 120 08/03/16   [provider]  Hyoscyamine Sulfate 0.375 MG TBCR Take 1 tablet every 12 hours by oral route.    [provider]  Liniments (SALONPAS EX) Apply 1 patch topically daily as needed (for pain).    [provider]  meclizine (ANTIVERT) 12.5 MG tablet Take 1 tablet (12.5 mg total) by mouth 3 (three) times daily as needed for dizziness. 08/28/20   Milagros Loll, MD  nitroGLYCERIN (NITROSTAT) 0.4 MG SL tablet Place 1 tablet (0.4 mg total) under the tongue every 5 (five) minutes as needed for chest pain. 08/09/19  Chrystie Nose, MD  ondansetron (ZOFRAN ODT) 4 MG disintegrating tablet Take 1 tablet (4 mg total) by mouth every 8 (eight) hours as needed for nausea or vomiting. 08/28/20   Milagros Loll, MD  potassium chloride SA (KLOR-CON M) 20 MEQ tablet Take 1 tablet (20 mEq total) by mouth daily in the afternoon. 02/14/22   Hilty, Lisette Abu, MD  torsemide (DEMADEX) 20 MG tablet Take 10 mg by mouth daily as needed.    [provider]      Allergies    Celecoxib, Lipitor [atorvastatin calcium], Zetia [ezetimibe], Ropinirole hcl, Actos [pioglitazone  hydrochloride], Crestor [rosuvastatin calcium], Demeclocycline, Doxycycline, Duloxetine, Fenofibrate, Imdur [isosorbide nitrate], Metformin and related, Metoprolol succinate [metoprolol], Pravachol [pravastatin], Ranolazine er, Rosuvastatin, and Niaspan [niacin]    Review of Systems   Review of Systems  Unable to perform ROS: Acuity of condition    Physical Exam Updated Vital Signs BP (!) 166/59 (BP Location: Left Arm)   Pulse 63   Temp 97.8 F (36.6 C)   Resp 13   Wt 91.4 kg   SpO2 99%   BMI 35.69 kg/m  Physical Exam Vitals and nursing note reviewed.  Constitutional:      General: She is not in acute distress. HENT:     Head: Normocephalic and atraumatic.  Eyes:     Conjunctiva/sclera: Conjunctivae normal.     Pupils: Pupils are equal, round, and reactive to light.  Cardiovascular:     Rate and Rhythm: Normal rate and regular rhythm.  Pulmonary:     Effort: Pulmonary effort is normal. No respiratory distress.  Abdominal:     General: There is no distension.     Tenderness: There is no guarding.  Musculoskeletal:        General: No deformity or signs of injury.     Cervical back: Neck supple.  Skin:    Findings: No lesion or rash.  Neurological:     Mental Status: She is alert.     Comments: Deferred to neuro who are actively assessing the patient     ED Results / Procedures / Treatments   Labs (all labs ordered are listed, but only abnormal results are displayed) Labs Reviewed  APTT - Abnormal; Notable for the following components:      Result Value   aPTT 22 (*)    All other components within normal limits  CBC - Abnormal; Notable for the following components:   RDW 15.9 (*)    All other components within normal limits  COMPREHENSIVE METABOLIC PANEL - Abnormal; Notable for the following components:   Glucose, Bld 139 (*)    Creatinine, Ser 1.30 (*)    GFR, Estimated 41 (*)    All other components within normal limits  URINALYSIS, ROUTINE W REFLEX  MICROSCOPIC - Abnormal; Notable for the following components:   Color, Urine AMBER (*)    APPearance HAZY (*)    Glucose, UA 150 (*)    Bilirubin Urine SMALL (*)    Ketones, ur 5 (*)    Leukocytes,Ua TRACE (*)    Bacteria, UA RARE (*)    All other components within normal limits  I-STAT CHEM 8, ED - Abnormal; Notable for the following components:   BUN 24 (*)    Creatinine, Ser 1.30 (*)    Glucose, Bld 134 (*)    All other components within normal limits  CBG MONITORING, ED - Abnormal; Notable for the following components:   Glucose-Capillary 155 (*)  All other components within normal limits  CBG MONITORING, ED - Abnormal; Notable for the following components:   Glucose-Capillary 109 (*)    All other components within normal limits  ETHANOL  PROTIME-INR  DIFFERENTIAL  RAPID URINE DRUG SCREEN, HOSP PERFORMED  CBG MONITORING, ED    EKG EKG Interpretation Date/Time:  Tuesday January 28 2023 17:43:10 EDT Ventricular Rate:  60 PR Interval:  170 QRS Duration:  133 QT Interval:  432 QTC Calculation: 432 R Axis:   -27  Text Interpretation: Sinus rhythm Probable left atrial enlargement IVCD, consider atypical RBBB Inferior infarct, old Confirmed by Ernie Avena ([id number]) on 01/28/2023 7:01:03 PM  Radiology MR BRAIN WO CONTRAST  Result Date: 01/28/2023 CLINICAL DATA:  Left-sided facial droop, stroke suspected EXAM: MRI HEAD WITHOUT CONTRAST TECHNIQUE: Multiplanar, multiecho pulse sequences of the brain and surrounding structures were obtained without intravenous contrast. COMPARISON:  08/28/2020 MRI head, correlation is also made with 01/28/2023 CT head FINDINGS: Evaluation is limited by motion. Brain: No restricted diffusion to suggest acute or subacute infarct. No acute hemorrhage, mass, mass effect, or midline shift. No hydrocephalus or extra-axial collection. No hemosiderin deposition to suggest remote hemorrhage. Age related cerebral atrophy. T2 hyperintense signal in the  periventricular white matter, likely the sequela of mild-to-moderate chronic small vessel ischemic disease. Vascular: Unable to evaluate due to motion. Skull and upper cervical spine: Normal marrow signal. Sinuses/Orbits: Mucosal thickening in the left maxillary sinus. No acute finding in the orbits. Other: The mastoid air cells are well aerated. IMPRESSION: Evaluation is limited by motion. Within this limitation, no acute intracranial process. No evidence of acute or subacute infarct. Electronically Signed   By: Wiliam Ke M.D.   On: 01/28/2023 22:25   CT HEAD CODE STROKE WO CONTRAST  Result Date: 01/28/2023 CLINICAL DATA:  Code stroke.  Left-sided facial droop EXAM: CT HEAD WITHOUT CONTRAST TECHNIQUE: Contiguous axial images were obtained from the base of the skull through the vertex without intravenous contrast. RADIATION DOSE REDUCTION: This exam was performed according to the departmental dose-optimization program which includes automated exposure control, adjustment of the mA and/or kV according to patient size and/or use of iterative reconstruction technique. COMPARISON:  CT Head 08/28/20 FINDINGS: Brain: No hemorrhage. No hydrocephalus. No extra-axial fluid collection. No CT evidence of an acute cortical infarct. No mass effect. No mass lesion. Assessment of the brainstem is limited due to streak artifact. Vascular: Dense atherosclerotic calcifications in the V4 segments bilateral vertebral arteries. Skull: Normal. Negative for fracture or focal lesion. Sinuses/Orbits: No middle ear or mastoid effusion. Paranasal sinuses are clear. Bilateral lens replacement. Orbits are otherwise unremarkable. Other: None ASPECTS ([place] Stroke Program Early CT Score): 10 IMPRESSION: No hemorrhage or CT evidence of an acute cortical infarct. Aspects 10. Findings were paged to Dr. Otelia Limes on 01/28/23 at 5:37 PM via [employer] paging system. Electronically Signed   By: Lorenza Cambridge M.D.   On: 01/28/2023 17:40     Procedures Procedures    Medications Ordered in ED Medications  lactated ringers bolus 1,000 mL (has no administration in time range)  LORazepam (ATIVAN) injection 1 mg (1 mg Intravenous Given 01/28/23 2051)    ED Course/ Medical Decision Making/ A&P                                 Medical Decision Making Amount and/or Complexity of Data Reviewed Labs: ordered. Radiology: ordered.  Risk Prescription drug management. Decision regarding hospitalization.  82 year old female presenting to the emergency department as a code stroke.  The patient was reportedly last seen normal at around 1430 and was found by her daughter at 56 with left-sided weakness, left-sided facial droop with associated lethargy and slurred speech.  Her blood pressure was notably of 88/50 and improved to 108/70 without intervention and route with EMS.  CBG was 190.  The patient has known MCA stenosis and follows outpatient with neurology at [facility].  On arrival, code stroke had been activated per EMS report, neurology was bedside evaluating the patient on the bridge and the patient's airway was cleared for CT imaging.  On arrival, afebrile, temperature 97.7, heart rate 61, BP 124/54 (had not received fluids with EMS), saturating 100% on room air.  CT Code Stroke:  IMPRESSION:  No hemorrhage or CT evidence of an acute cortical infarct. Aspects  10.    Findings were paged to Dr. Otelia Limes on 01/28/23 at 5:37 PM via [facility]  paging system.    Labs: CMP with an AKI with a creatinine of 1.3 from baseline of 0.85, UDS negative, urinalysis without convincing evidence for UTI, negative nitrites, trace leukocytes, 0-5 WBCs and rare bacteria, ethanol level normal, CBC without a leukocytosis or anemia, CBG on arrival was 109.  Neurology recommendations: Recommendations: - MRI brain w/o to evaluate for stroke. If positive will continue with full stroke workup - IVF for hypotension  - Evaluate for infection  -  Neurology will continue to follow   MRI Brain: IMPRESSION:  Evaluation is limited by motion. Within this limitation, no acute  intracranial process. No evidence of acute or subacute infarct.   Symptoms have improved with improvement in blood pressure.  MRI did not clearly show evidence of acute intracranial process but was motion limited.  An IV fluid bolus of 1 L was ordered given the patient's AKI.  The patient likely has an AKI that is prerenal with decreased oral intake.  She likely experienced stroke reactivation in the setting of hypotension.  Neurology is recommending fluid resuscitation, no clear infectious etiology identified.  Hospitalist medicine was consulted for admission for observation in the setting of the patient's hypertension, AKI and strokelike symptoms.  Family was updated regarding the plan of care.  Patient stable at time of admission, Dr. Imogene Burn accepting.   Final Clinical Impression(s) / ED Diagnoses Final diagnoses:  Stroke-like symptoms  Hypotension, unspecified hypotension type  AKI (acute kidney injury) [facility])    Rx / DC Orders ED Discharge Orders     None         Ernie Avena, MD 01/28/23 2339

## 2023-01-28 NOTE — ED Notes (Signed)
Patient returned from MRI, placed back on cardiac and vital sign monitoring. Family remains at bedside.

## 2023-01-28 NOTE — Code Documentation (Signed)
Stroke Response Nurse Documentation Code Documentation  Whitney Newton is a 82 y.o. female arriving to [facility]  via [facility] on 01/28/23 with past medical hx of CHF, DM, HLD, HTN, hypothyroidism, diverticulitis, prior back surgery, and hemangioma of liver. On clopidogrel 75 mg daily. Code stroke was activated by EMS.   Patient from home where she was LKW at 1430 and now complaining of L sided weakness and facial droop . Pts daughter found her at 1630 with L facial droop, weakness, slurred speech and lethargy. BP with EMS 88/50.   Stroke team at the bedside on patient arrival. Labs drawn and patient cleared for CT by Dr Karene Fry. Patient to CT with team. NIHSS 7, see documentation for details and code stroke times.  The following imaging was completed:  CT Head. Patient is not a candidate for IV Thrombolytic due to symptoms mild/improving. Patient is not a candidate for IR due to no LVO suspected.   Care Plan: q62min neuro checks and vitals until outside of window- 1900. Then q2h  Bedside handoff with ED RN Loistine Simas.    Mordecai Rasmussen  Stroke Response RN

## 2023-01-28 NOTE — ED Triage Notes (Signed)
Pt bib ems from home; LSN 1430; found by daughter at 1630 with L sided weakness, L sided facial droop, lethargy, and slurred speech; bp 88/50; known MCA stenosis; no recent falls; pt more responsive after lying in ambulance; cbg 190, 108/70, HR 76; hx DM

## 2023-01-29 ENCOUNTER — Observation Stay (HOSPITAL_BASED_OUTPATIENT_CLINIC_OR_DEPARTMENT_OTHER): Payer: Medicare Other

## 2023-01-29 ENCOUNTER — Encounter (HOSPITAL_COMMUNITY): Payer: Self-pay | Admitting: Internal Medicine

## 2023-01-29 DIAGNOSIS — I10 Essential (primary) hypertension: Secondary | ICD-10-CM | POA: Diagnosis not present

## 2023-01-29 DIAGNOSIS — G459 Transient cerebral ischemic attack, unspecified: Secondary | ICD-10-CM | POA: Diagnosis not present

## 2023-01-29 DIAGNOSIS — I959 Hypotension, unspecified: Secondary | ICD-10-CM | POA: Diagnosis not present

## 2023-01-29 DIAGNOSIS — I6601 Occlusion and stenosis of right middle cerebral artery: Secondary | ICD-10-CM | POA: Diagnosis not present

## 2023-01-29 DIAGNOSIS — E782 Mixed hyperlipidemia: Secondary | ICD-10-CM | POA: Diagnosis not present

## 2023-01-29 DIAGNOSIS — Z794 Long term (current) use of insulin: Secondary | ICD-10-CM

## 2023-01-29 DIAGNOSIS — E1169 Type 2 diabetes mellitus with other specified complication: Secondary | ICD-10-CM

## 2023-01-29 DIAGNOSIS — N179 Acute kidney failure, unspecified: Secondary | ICD-10-CM | POA: Diagnosis not present

## 2023-01-29 DIAGNOSIS — E118 Type 2 diabetes mellitus with unspecified complications: Secondary | ICD-10-CM

## 2023-01-29 DIAGNOSIS — Z8673 Personal history of transient ischemic attack (TIA), and cerebral infarction without residual deficits: Secondary | ICD-10-CM | POA: Diagnosis not present

## 2023-01-29 LAB — COMPREHENSIVE METABOLIC PANEL
ALT: 15 U/L (ref 0–44)
AST: 19 U/L (ref 15–41)
Albumin: 2.9 g/dL — ABNORMAL LOW (ref 3.5–5.0)
Alkaline Phosphatase: 60 U/L (ref 38–126)
Anion gap: 13 (ref 5–15)
BUN: 13 mg/dL (ref 8–23)
CO2: 23 mmol/L (ref 22–32)
Calcium: 8.4 mg/dL — ABNORMAL LOW (ref 8.9–10.3)
Chloride: 107 mmol/L (ref 98–111)
Creatinine, Ser: 0.77 mg/dL (ref 0.44–1.00)
GFR, Estimated: >60 mL/min (ref >60)
Glucose, Bld: 242 mg/dL — ABNORMAL HIGH (ref 70–99)
Potassium: 3.5 mmol/L (ref 3.5–5.1)
Sodium: 143 mmol/L (ref 135–145)
Total Bilirubin: 1 mg/dL (ref 0.3–1.2)
Total Protein: 4.8 g/dL — ABNORMAL LOW (ref 6.5–8.1)

## 2023-01-29 LAB — CBC WITH DIFFERENTIAL/PLATELET
Abs Immature Granulocytes: 0.01 10*3/uL (ref 0.00–0.07)
Basophils Absolute: 0 10*3/uL (ref 0.0–0.1)
Basophils Relative: 0 %
Eosinophils Absolute: 0.1 10*3/uL (ref 0.0–0.5)
Eosinophils Relative: 2 %
HCT: 31.2 % — ABNORMAL LOW (ref 36.0–46.0)
Hemoglobin: 10.8 g/dL — ABNORMAL LOW (ref 12.0–15.0)
Immature Granulocytes: 0 %
Lymphocytes Relative: 33 %
Lymphs Abs: 2.5 10*3/uL (ref 0.7–4.0)
MCH: 33.6 pg (ref 26.0–34.0)
MCHC: 34.6 g/dL (ref 30.0–36.0)
MCV: 97.2 fL (ref 80.0–100.0)
Monocytes Absolute: 0.5 10*3/uL (ref 0.1–1.0)
Monocytes Relative: 7 %
Neutro Abs: 4.4 10*3/uL (ref 1.7–7.7)
Neutrophils Relative %: 58 %
Platelets: 247 10*3/uL (ref 150–400)
RBC: 3.21 MIL/uL — ABNORMAL LOW (ref 3.87–5.11)
RDW: 15.6 % — ABNORMAL HIGH (ref 11.5–15.5)
WBC: 7.5 10*3/uL (ref 4.0–10.5)
nRBC: 0 % (ref 0.0–0.2)

## 2023-01-29 LAB — CBG MONITORING, ED: Glucose-Capillary: 130 mg/dL — ABNORMAL HIGH (ref 70–99)

## 2023-01-29 LAB — HEMOGLOBIN A1C
Hgb A1c MFr Bld: 6.7 % — ABNORMAL HIGH (ref 4.8–5.6)
Mean Plasma Glucose: 145.59 mg/dL

## 2023-01-29 LAB — LIPID PANEL
Cholesterol: 102 mg/dL (ref 0–200)
HDL: 52 mg/dL (ref >40)
LDL Cholesterol: 30 mg/dL (ref 0–99)
Total CHOL/HDL Ratio: 2 RATIO
Triglycerides: 100 mg/dL (ref <150)
VLDL: 20 mg/dL (ref 0–40)

## 2023-01-29 LAB — ECHOCARDIOGRAM COMPLETE
AR max vel: 2.26 cm2
AV Peak grad: 7.6 mmHg
Ao pk vel: 1.38 m/s
Area-P 1/2: 3.76 cm2
Calc EF: 70 %
S' Lateral: 2.4 cm
Single Plane A2C EF: 70.1 %
Single Plane A4C EF: 71 %
Weight: 3224.01 oz

## 2023-01-29 LAB — MAGNESIUM: Magnesium: 1.7 mg/dL (ref 1.7–2.4)

## 2023-01-29 MED ORDER — LEVOTHYROXINE SODIUM 88 MCG PO TABS: 88.0000 ug | ORAL_TABLET | Freq: Every day | ORAL | Status: DC

## 2023-01-29 MED ORDER — POTASSIUM CHLORIDE 2 MEQ/ML IV SOLN
INTRAVENOUS | Status: AC
  Filled 2023-01-29: qty 1000

## 2023-01-29 MED ORDER — COLESEVELAM HCL 625 MG PO TABS: 1875.0000 mg | ORAL_TABLET | Freq: Two times a day (BID) | ORAL | Status: DC | PRN

## 2023-01-29 MED ORDER — MAGNESIUM SULFATE 2 GM/50ML IV SOLN
2.0000 g | Freq: Once | INTRAVENOUS | Status: AC
  Administered 2023-01-29: 2 g via INTRAVENOUS
  Filled 2023-01-29: qty 50

## 2023-01-29 MED ORDER — ACETAMINOPHEN 325 MG PO TABS: 650.0000 mg | ORAL_TABLET | Freq: Four times a day (QID) | ORAL | Status: DC | PRN

## 2023-01-29 MED ORDER — LEVOTHYROXINE SODIUM 88 MCG PO TABS
88.0000 ug | ORAL_TABLET | Freq: Every day | ORAL | Status: DC
  Administered 2023-01-29: 88 ug via ORAL
  Filled 2023-01-29: qty 1

## 2023-01-29 MED ORDER — HEPARIN SODIUM (PORCINE) 5000 UNIT/ML IJ SOLN
5000.0000 [IU] | Freq: Three times a day (TID) | INTRAMUSCULAR | Status: DC
  Administered 2023-01-29 (×2): 5000 [IU] via SUBCUTANEOUS
  Filled 2023-01-29 (×2): qty 1

## 2023-01-29 MED ORDER — ONDANSETRON HCL 4 MG/2ML IJ SOLN: 4.0000 mg | Freq: Four times a day (QID) | INTRAMUSCULAR | Status: DC | PRN

## 2023-01-29 MED ORDER — LOSARTAN POTASSIUM 25 MG PO TABS: 25.0000 mg | ORAL_TABLET | Freq: Every day | ORAL | Status: DC

## 2023-01-29 MED ORDER — DILTIAZEM HCL ER COATED BEADS 120 MG PO CP24
120.0000 mg | ORAL_CAPSULE | Freq: Every day | ORAL | Status: DC
  Administered 2023-01-29: 120 mg via ORAL
  Filled 2023-01-29 (×2): qty 1

## 2023-01-29 MED ORDER — POTASSIUM CHLORIDE 2 MEQ/ML IV SOLN: INTRAVENOUS | Status: DC

## 2023-01-29 MED ORDER — MELATONIN 5 MG PO TABS: 10.0000 mg | ORAL_TABLET | Freq: Every evening | ORAL | Status: DC | PRN

## 2023-01-29 MED ORDER — ONDANSETRON HCL 4 MG PO TABS: 4.0000 mg | ORAL_TABLET | Freq: Four times a day (QID) | ORAL | Status: DC | PRN

## 2023-01-29 MED ORDER — CLOPIDOGREL BISULFATE 75 MG PO TABS
75.0000 mg | ORAL_TABLET | Freq: Every day | ORAL | Status: DC
  Administered 2023-01-29: 75 mg via ORAL
  Filled 2023-01-29: qty 1

## 2023-01-29 MED ORDER — ASPIRIN 81 MG PO TBEC
81.0000 mg | DELAYED_RELEASE_TABLET | Freq: Every day | ORAL | Status: DC
  Administered 2023-01-29: 81 mg via ORAL
  Filled 2023-01-29: qty 1

## 2023-01-29 MED ORDER — ACETAMINOPHEN 650 MG RE SUPP: 650.0000 mg | Freq: Four times a day (QID) | RECTAL | Status: DC | PRN

## 2023-01-29 MED ORDER — POTASSIUM CHLORIDE CRYS ER 20 MEQ PO TBCR
40.0000 meq | EXTENDED_RELEASE_TABLET | Freq: Two times a day (BID) | ORAL | Status: DC
  Administered 2023-01-29: 40 meq via ORAL
  Filled 2023-01-29: qty 2

## 2023-01-29 MED ORDER — KCL-LACTATED RINGERS 20 MEQ/L IV SOLN
INTRAVENOUS | Status: DC
  Filled 2023-01-29: qty 1000

## 2023-01-29 NOTE — Progress Notes (Signed)
Echocardiogram 2D Echocardiogram has been performed.  Lanee Chain N Santiago Graf,RDCS 01/29/2023, 2:41 PM

## 2023-01-29 NOTE — Subjective & Objective (Signed)
CC: stroke symptoms HPI: 82 year old Caucasian female prior history of stroke, known right MCA stenosis, hypertension, type 2 diabetes, hyperlipidemia, presents to the ER today with strokelike symptoms.  She had left facial drooping, left hemiparesis.  She had difficulty talking.  This was noted by the daughter.  Patient was drooling.  This was around 4:30 PM.  EMS was activated.  Her blood pressure was noted to be 88/50 at the time of EMS arrival.  Patient received some IV fluids and her blood pressure improved to 108/70.  Patient's symptoms improved after blood pressure was greater than 100.  Code stroke was called at time of admission the ER.  Patient had a stroke in 2005 and then again in 2014.  She is followed by [facility] neurology.  She has a known right MCA stenosis.  This was followed on a regular basis.  There is never been discussion on intervention of her stenosis in the past.  White count 8.8, hemoglobin 13, platelets of 304  Sodium 141, potassium 3.8, chloride of 105, bicarb 23, BUN of 22, creatinine 1.3  Labs from March 2024 showed a BUN of 23 and a creatinine 1.07  Code stroke CT was negative for acute infarct or hemorrhage.  MRI brain without contrast was limited by motion.  There is no acute infarct seen.  Patient's neurologic symptoms had completely resolved by this time.  Neurology saw the patient and felt that her transient hypotension was the cause of her acute neurologic symptoms.  No further workup was recommended.  [facility] hospitalist consulted due to acute kidney injury.

## 2023-01-29 NOTE — Progress Notes (Addendum)
STROKE TEAM PROGRESS NOTE   BRIEF HPI Ms. Whitney Newton is a 82 y.o. female with history of right MCA stroke, known right MCA stenosis, diabetes, CHF, hypertension, hyperlipidemia, hypothyroidism and cancer presenting with acute onset left-sided weakness, left facial droop and slurred speech.  Patient was noted to be quite hypotensive on arrival and states that she may not have been drinking enough fluids that day.  Symptoms rapidly improved with rise in blood pressure.  MRI was negative for acute infarct.   INTERIM HISTORY/SUBJECTIVE Patient has been hemodynamically stable and actually has been somewhat hypertensive with NS @ 125cc.  She reports that left-sided weakness and slurred speech have resolved.   OBJECTIVE  CBC    Component Value Date/Time   WBC 7.5 01/29/2023 0934   RBC 3.21 (L) 01/29/2023 0934   HGB 10.8 (L) 01/29/2023 0934   HGB 15.1 02/10/2018 1053   HCT 31.2 (L) 01/29/2023 0934   HCT 42.8 02/10/2018 1053   PLT 247 01/29/2023 0934   PLT 289 02/10/2018 1053   MCV 97.2 01/29/2023 0934   MCV 89 02/10/2018 1053   MCH 33.6 01/29/2023 0934   MCHC 34.6 01/29/2023 0934   RDW 15.6 (H) 01/29/2023 0934   RDW 13.8 02/10/2018 1053   LYMPHSABS 2.5 01/29/2023 0934   MONOABS 0.5 01/29/2023 0934   EOSABS 0.1 01/29/2023 0934   BASOSABS 0.0 01/29/2023 0934    BMET    Component Value Date/Time   NA 143 01/29/2023 0934   NA 143 02/21/2022 1224   K 3.5 01/29/2023 0934   CL 107 01/29/2023 0934   CO2 23 01/29/2023 0934   GLUCOSE 242 (H) 01/29/2023 0934   BUN 13 01/29/2023 0934   BUN 26 02/21/2022 1224   CREATININE 0.77 01/29/2023 0934   CREATININE 0.77 08/29/2016 1205   CALCIUM 8.4 (L) 01/29/2023 0934   EGFR 63 02/21/2022 1224   GFRNONAA >60 01/29/2023 0934    IMAGING past 24 hours MR BRAIN WO CONTRAST  Result Date: 01/28/2023 CLINICAL DATA:  Left-sided facial droop, stroke suspected EXAM: MRI HEAD WITHOUT CONTRAST TECHNIQUE: Multiplanar, multiecho pulse sequences of  the brain and surrounding structures were obtained without intravenous contrast. COMPARISON:  08/28/2020 MRI head, correlation is also made with 01/28/2023 CT head FINDINGS: Evaluation is limited by motion. Brain: No restricted diffusion to suggest acute or subacute infarct. No acute hemorrhage, mass, mass effect, or midline shift. No hydrocephalus or extra-axial collection. No hemosiderin deposition to suggest remote hemorrhage. Age related cerebral atrophy. T2 hyperintense signal in the periventricular white matter, likely the sequela of mild-to-moderate chronic small vessel ischemic disease. Vascular: Unable to evaluate due to motion. Skull and upper cervical spine: Normal marrow signal. Sinuses/Orbits: Mucosal thickening in the left maxillary sinus. No acute finding in the orbits. Other: The mastoid air cells are well aerated. IMPRESSION: Evaluation is limited by motion. Within this limitation, no acute intracranial process. No evidence of acute or subacute infarct. Electronically Signed   By: Wiliam Ke M.D.   On: 01/28/2023 22:25   CT HEAD CODE STROKE WO CONTRAST  Result Date: 01/28/2023 CLINICAL DATA:  Code stroke.  Left-sided facial droop EXAM: CT HEAD WITHOUT CONTRAST TECHNIQUE: Contiguous axial images were obtained from the base of the skull through the vertex without intravenous contrast. RADIATION DOSE REDUCTION: This exam was performed according to the departmental dose-optimization program which includes automated exposure control, adjustment of the mA and/or kV according to patient size and/or use of iterative reconstruction technique. COMPARISON:  CT Head 08/28/20 FINDINGS:  Brain: No hemorrhage. No hydrocephalus. No extra-axial fluid collection. No CT evidence of an acute cortical infarct. No mass effect. No mass lesion. Assessment of the brainstem is limited due to streak artifact. Vascular: Dense atherosclerotic calcifications in the V4 segments bilateral vertebral arteries. Skull: Normal.  Negative for fracture or focal lesion. Sinuses/Orbits: No middle ear or mastoid effusion. Paranasal sinuses are clear. Bilateral lens replacement. Orbits are otherwise unremarkable. Other: None ASPECTS ([place] Stroke Program Early CT Score): 10 IMPRESSION: No hemorrhage or CT evidence of an acute cortical infarct. Aspects 10. Findings were paged to Dr. Otelia Limes on 01/28/23 at 5:37 PM via [facility] paging system. Electronically Signed   By: Lorenza Cambridge M.D.   On: 01/28/2023 17:40    Vitals:   01/29/23 1030 01/29/23 1100 01/29/23 1218 01/29/23 1227  BP: (!) 171/59 (!) 174/61 (!) 182/65   Pulse: 71 70 68   Resp: 16 14 20    Temp:    97.6 F (36.4 C)  TempSrc:    Oral  SpO2: 100% 99% 100%   Weight:         PHYSICAL EXAM General:  Alert, well-nourished, well-developed patient in no acute distress Psych:  Mood and affect appropriate for situation CV: Regular rate and rhythm on monitor Respiratory:  Regular, unlabored respirations on room air GI: Abdomen soft and nontender   NEURO:  Mental Status: AA&Ox3, patient is able to give clear and coherent history Speech/Language: speech is without dysarthria or aphasia.    Cranial Nerves:  II: PERRL.  III, IV, VI: EOMI. Eyelids elevate symmetrically.  V: Sensation is intact to light touch and symmetrical to face.  VII: Face is symmetrical resting and smiling VIII: hearing intact to voice. IX, X: Phonation is normal.  GN:[id number] shrug 5/5. XII: tongue is midline without fasciculations. Motor: 5/5 strength to bilateral upper extremities and right lower extremity, 3 out of 5 strength to left lower extremity Tone: is normal and bulk is normal Sensation- Intact to light touch bilaterally.  Coordination: FTN intact bilaterally.  No drift.  Gait- deferred   ASSESSMENT/PLAN  Right hemisphere TIA due to hypotension in the setting of right CCA and MCA chronic stenosis  Code Stroke CT head No acute abnormality. ASPECTS 10.    MRI no acute  abnormality Further neuro imaging per Dr. Cherre Blanc as outpt 2D Echo EF 65-70% LDL 30 HgbA1c 6.7 VTE prophylaxis - subcutaneous heparin aspirin 81 mg daily and clopidogrel 75 mg daily prior to admission, now continue home aspirin 81 mg daily and clopidogrel 75 mg daily. Therapy recommendations: none Disposition: home with close follow up with Dr. Cherre Blanc at [facility]  Chronic right CCA and MCA stenosis Hx of Stroke/TIA  Patient has history of right MCA strokes in 2005 in 2014 Patient has known right MCA stenosis, followed at [facility] by Dr. Cherre Blanc 12/2022 CUS and TCD Incresaed velocity suggests 50-69% stenosis in the right proximal CCA. Increased velocity suggests moderate stenosis or spasm  in the right MCA, with mild in the right ACA and PCA, and the left MCA and ACA  12/2022 pt followed up with Dr. Cherre Blanc, overall the right MCA stenosis is stable, and continue ASA and plavix DAPT  Plan for close follow-up with Dr. Cherre Blanc given current episode Continue home DAPT Further neuro imaging (including CTA head and neck) per Dr. Cherre Blanc Pt is aware to call for close follow up appointment with Dr. Cherre Blanc or her colleagues.  Hx of hypertension Hypotension on admission likely due to dehydration AKI Home meds: Diltiazem  120 mg daily, Cozaar 25 mg daily BP low on admission 80s AKI Cre 1.30->0.77 Received fluid resuscitation  BP stable now, on the high end Maintain normotension Avoid dehydration   Hyperlipidemia Home meds: Repatha  LDL 30, goal < 70 Continue repatha at discharge  Diabetes type II Controlled Home meds: Humalog 11 units at breakfast, 14 units at lunch and 16 units at dinner plus sliding scale, Toujeo 9 units daily HgbA1c 6.7, goal < 7.0 CBGs SSI Recommend close follow-up with PCP for better DM control  Other Stroke Risk Factors Congestive heart failure Obesity with BMI 35.69  Other Active Problems 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 Monica Becton day # 0  Neurology will sign off. Please call with questions. Pt will follow up with Dr. Cherre Blanc at [facility] ASAP. Thanks for the consult.   Marvel Plan, MD PhD Stroke Neurology 01/29/2023 6:37 PM     To contact Stroke Continuity provider, please refer to [email or web address]. After hours, contact General Neurology

## 2023-01-29 NOTE — Assessment & Plan Note (Signed)
Had right MCA infarct in 2005 and 2014. Followed by [facility] neurology.

## 2023-01-29 NOTE — Discharge Summary (Signed)
Physician Discharge Summary   Patient: Whitney Newton MRN: [id number] DOB: Oct 15, 1940  Admit date:     01/28/2023  Discharge date: 01/31/23  Discharge Physician: Marguerita Merles, DO   PCP: Irven Coe, MD   Recommendations at discharge:   Follow up with PCP within 1-2 weeks and repeat CBC, CMP, mag, Phos within 1 week Follow-up with neurology at [facility]  Discharge Diagnoses: Principal Problem:   AKI (acute kidney injury) (HCC) Active Problems:   Transient hypotension   Middle cerebral artery stenosis, right   Diabetes mellitus type 2, controlled (HCC)   Benign essential HTN   Combined hyperlipidemia associated with type 2 diabetes mellitus (HCC)   History of stroke  Resolved Problems:   * No resolved hospital problems. [facility] Course: HPI per Dr. Carollee Herter on 01/28/23  82 year old Caucasian female prior history of stroke, known right MCA stenosis, hypertension, type 2 diabetes, hyperlipidemia, presents to the ER today with strokelike symptoms.  She had left facial drooping, left hemiparesis.  She had difficulty talking.  This was noted by the daughter.  Patient was drooling.  This was around 4:30 PM.   EMS was activated.  Her blood pressure was noted to be 88/50 at the time of EMS arrival.  Patient received some IV fluids and her blood pressure improved to 108/70.  Patient's symptoms improved after blood pressure was greater than 100.   Code stroke was called at time of admission the ER.   Patient had a stroke in 2005 and then again in 2014.  She is followed by [facility] neurology.  She has a known right MCA stenosis.  This was followed on a regular basis.  There is never been discussion on intervention of her stenosis in the past.   White count 8.8, hemoglobin 13, platelets of 304   Sodium 141, potassium 3.8, chloride of 105, bicarb 23, BUN of 22, creatinine 1.3   Labs from March 2024 showed a BUN of 23 and a creatinine 1.07   Code stroke CT was negative for acute infarct  or hemorrhage.   MRI brain without contrast was limited by motion.  There is no acute infarct seen.   Patient's neurologic symptoms had completely resolved by this time.   Neurology saw the patient and felt that her transient hypotension was the cause of her acute neurologic symptoms.  No further workup was recommended.   [facility] hospital[facility] consulted due to acute kidney injury.   **Interim History She is rehydrated and kidney function improved and neurology evaluated and her left-sided weakness and slurred speech resolved.  MRI was negative for infarct and the neurologist felt that she had a TIA due to hypotension in the setting of her right CCA and MCA chronic stenosis.  They are recommending aspirin and clopidogrel indefinitely and discharging given her completed stroke workup and recommending close following with Dr. Cherre Blanc with further neuroimaging per Dr. Cherre Blanc and vascular evaluation at that time as soon as possible.  She is deemed medically stable for discharge and PT OT evaluated and she is stable for discharge at this time.   Assessment and Plan:  * AKI (acute kidney injury) (HCC) Observation med/tele bed. Scr 1.3. baseline Scr 1.07. may be caused by recent hypotension vs cozaar/demadex.  -Will hold ARB and diuretics.   -BUN/Cr Trend: Recent Labs  Lab 01/28/23 1719 01/28/23 1730 01/29/23 0934  BUN 22 24* 13  CREATININE 1.30* 1.30* 0.77  -Avoid Nephrotoxic Medications, Contrast Dyes, Hypotension and Dehydration to Ensure Adequate Renal  Perfusion and will need to Renally Adjust Meds -Continue to Monitor and Trend Renal Function carefully and repeat CMP within 1 week ; -Patient's ARB was cut in half and she was told at take her diuretics as needed.  She will need close follow-up and repeat CMP in 1 week  Acute TIA i due to hypotension in the setting of middle cerebral artery stenosis, right -Pt with known right MCA stenosis based on studies at [facility] 05-2022 and  12-2022.  Given her stroke-like symptoms with left facial droop, left sided hemiparesis, her stenosis of her right MCA is likely significant given that she could not tolerated SBP <100 without significant neurologic effects.  -MRI brain is negative and her symptoms quickly resolved with IVF and keep her SBP >100.   -Discussed with pt and dtr(debbie).  -Keep her SBP higher than 120 and make she is is not dehydrated so that her BP is high enough to overcome the MCA stenosis.  -Underwent a full neurowork-up and CTA head stroke showed no acute intrathoracic cranial abnormality along with MRI -CTA of the head and neck was deferred and patient to follow-up with Dr. Cherre Blanc as an outpatient to have this done -Continue dual antiplatelet therapy -Reach out to neurologist at [facility] to consider intervention on MCA stenosis. -Dtr states she will contact pt's neurologist at [facility] and discuss with them after discharge from hospital. -For the time being, held her ARB and changed to half a dose and only let her use demadex as needed for LE edema. -Will need to make sure that SBP is at minimum 120.  Transient hypotension -Pt with acute neurologic symptoms mimicking an acute right MCA CVA when her SBP <100. This quickly resolved with IVF. MRI brian negative for CVA. Pt seen by neurology. No further intervention needed.  Will hold ARB while she is AKI and resume at half the dose at discharge  History of stroke -Had right MCA infarct in 2005 and 2014. Followed by [facility] neurology.  Combined hyperlipidemia associated with type 2 diabetes mellitus (HCC) -Continue welchol. On repatha at home. Pt has reported allergies to statins.  She can continue Repatha at discharge  Benign essential HTN -Will reduce ARB. Need to keep SBP >=120 at minimum.  -Continue monitor blood pressures per protocol and follow-up in outpatient setting  Diabetes mellitus type 2, controlled (HCC) -On lantus and SSI. Check A1c.  And was  6.7  Normocytic Anemia -Hgb/Hct Trend: Recent Labs  Lab 01/28/23 1719 01/28/23 1730 01/29/23 0934  HGB 13.0 12.2 10.8*  HCT 38.0 36.0 31.2*  MCV 94.8  --  97.2  -Likely dilutional drop in the setting of IV fluid resuscitation -Check anemia panel in outpatient setting -Continue to monitor and repeat CBC within 1 week  Hypoalbuminemia -Patient's Albumin Trend: Recent Labs  Lab 01/28/23 1719 01/29/23 0934  ALBUMIN 4.1 2.9*  -Continue to Monitor and Trend and repeat CMP in the AM  Obesity -Complicates overall prognosis and care -Estimated body mass index is 35.69 kg/m as calculated from the following:   Height as of 11/15/22: 5\' 3"  (1.6 m).   Weight as of this encounter: 91.4 kg.  -Weight Loss and Dietary Counseling given  Consultants: Neurology Procedures performed: As delineated as above Disposition: Home health Diet recommendation:  Discharge Diet Orders (From admission, onward)     Start     Ordered   01/29/23 0000  Diet - low sodium heart healthy        01/29/23 1528  Cardiac and Carb modified diet DISCHARGE MEDICATION: Allergies as of 01/29/2023       Reactions   Celecoxib Shortness Of Breath   Lipitor [atorvastatin Calcium] Shortness Of Breath, Other (See Comments)   CHEST PAIN   Zetia [ezetimibe] Shortness Of Breath, Other (See Comments)   NO SLEEP   Ropinirole Hcl Other (See Comments)   Restless legs syndrome.   Actos [pioglitazone Hydrochloride] Swelling   Crestor [rosuvastatin Calcium] Other (See Comments)   LEG PAIN   Demeclocycline    gastritis   Doxycycline Other (See Comments)   gastritis   Duloxetine Nausea Only   Fenofibrate    Unknown reaction   Imdur [isosorbide Nitrate] Diarrhea   Headache, stiff neck   Metformin And Related Swelling   Weight gain   Metoprolol Succinate [metoprolol] Other (See Comments)   Joint pain   Pravachol [pravastatin]    Joint pain   Ranolazine Er Other (See Comments)   EDEMA   Rosuvastatin  Other (See Comments)   LEG PAIN   Niaspan [niacin] Rash   Flushing         Medication List     TAKE these medications    aspirin 81 MG tablet Take 81 mg by mouth daily.   clopidogrel 75 MG tablet Commonly known as: PLAVIX Take 1 tablet (75 mg total) by mouth daily.   colesevelam 625 MG tablet Commonly known as: WELCHOL Take 3 tablets (1,875 mg total) by mouth 2 (two) times daily as needed (cholesterol).   diltiazem 120 MG 24 hr capsule Commonly known as: Cardizem CD Take 1 capsule (120 mg total) by mouth daily.   famotidine 10 MG tablet Commonly known as: PEPCID Take 10 mg by mouth daily in the afternoon.   gabapentin 100 MG capsule Commonly known as: NEURONTIN Take 3 capsules (300mg ) by mouth at night and take additional 1-3 capsules daily as needed for breakthrough.   Glucagon 0.5 MG/0.1ML Soaj   HumaLOG KwikPen 100 UNIT/ML KwikPen Generic drug: insulin lispro Inject 14 Units into the skin See admin instructions. Inject 11 units SQ at breakfast, inject 14 units SQ at lunch and inject 16 units SQ at supper. Plus sliding scale 1 unit for every 25 BS is > 120   Hyoscyamine Sulfate 0.375 MG Tbcr Take 1 tablet every 12 hours by oral route.   levothyroxine 88 MCG tablet Commonly known as: SYNTHROID Take 88 mcg by mouth daily.   losartan 25 MG tablet Commonly known as: COZAAR Take 1 tablet (25 mg total) by mouth daily. What changed: See the new instructions.   nitroGLYCERIN 0.4 MG SL tablet Commonly known as: Nitrostat Place 1 tablet (0.4 mg total) under the tongue every 5 (five) minutes as needed for chest pain.   ondansetron 4 MG disintegrating tablet Commonly known as: Zofran ODT Take 1 tablet (4 mg total) by mouth every 8 (eight) hours as needed for nausea or vomiting.   potassium chloride SA 20 MEQ tablet Commonly known as: KLOR-CON M Take 1 tablet (20 mEq total) by mouth daily in the afternoon.   Repatha SureClick 140 MG/ML Soaj Generic drug:  Evolocumab Inject 140 mg into the skin every 14 (fourteen) days.   SALONPAS EX Apply 1 patch topically daily as needed (for pain).   torsemide 20 MG tablet Commonly known as: DEMADEX Take 10 mg by mouth daily as needed.   Toujeo SoloStar 300 UNIT/ML Solostar Pen Generic drug: insulin glargine (1 Unit Dial) Inject 9 Units into the skin.   Vitamin  B-12 5000 MCG Tbdp Take 5,000 mcg by mouth daily.        Follow-up Information     Irven Coe, MD. Call.   Specialty: Family Medicine Why: Follow up within 1-2 weeks Contact information: [address] [place] [address] [phone]         Emmit Alexanders, MD. Call.   Specialty: Neurology Why: Follow up within 1-2 weeks Contact information: [address] [place] [address] [phone]                Discharge Exam: Ceasar Mons Weights   01/28/23 1700  Weight: 91.4 kg   Vitals:   01/29/23 1448 01/29/23 1525  BP: (!) 187/59   Pulse: 72   Resp: 16   Temp:  97.7 F (36.5 C)  SpO2: 100%    Examination: Physical Exam:  Constitutional: WN/WD obese elderly Caucasian female no acute distress Respiratory: Diminished to auscultation bilaterally, no wheezing, rales, rhonchi or crackles. Normal respiratory effort and patient is not tachypenic. No accessory muscle use.  Unlabored breathing Cardiovascular: RRR, no murmurs / rubs / gallops. S1 and S2 auscultated. No extremity edema.  Abdomen: Soft, non-tender, distended secondary to body habitus. Bowel sounds positive.  GU: Deferred. Musculoskeletal: No clubbing / cyanosis of digits/nails. No joint deformity upper and lower extremities.  Skin: No rashes, lesions, ulcers on limited skin evaluation. No induration; Warm and dry.  Neurologic: CN 2-12 grossly intact with no focal deficits. Romberg sign and cerebellar reflexes not assessed.  Psychiatric: Normal judgment and insight. Alert and oriented x 3. Normal mood and appropriate affect.    Condition at discharge: stable  The results of significant diagnostics from this hospitalization (including imaging, microbiology, ancillary and laboratory) are listed below for reference.   Imaging Studies: ECHOCARDIOGRAM COMPLETE  Result Date: 01/29/2023    ECHOCARDIOGRAM REPORT   Patient Name:   ABRIL NEEDLER Date of Exam: 01/29/2023 Medical Rec #:  [id number]      Height:       63.0 in Accession #:    [id number]     Weight:       201.5 lb Date of Birth:  July 12, 1940      BSA:          1.940 m Patient Age:    82 years       BP:           182/65 mmHg Patient Gender: F              HR:           73 bpm. Exam Location:  Inpatient Procedure: 2D Echo, Color Doppler and Cardiac Doppler Indications:    TIA  History:        Patient has prior history of Echocardiogram examinations, most                 recent 01/09/2022. CHF, CAD; Risk Factors:Diabetes, Dyslipidemia                 and Hypertension.  Sonographer:    Raeford Razor RDCS Referring Phys: [id number] Scheryl Marten XU IMPRESSIONS  1. Left ventricular ejection fraction, by estimation, is 65 to 70%. The left ventricle has normal function. The left ventricle has no regional wall motion abnormalities. Left ventricular diastolic parameters were normal.  2. Right ventricular systolic function is normal. The right ventricular size is normal. Tricuspid regurgitation signal is inadequate for assessing PA pressure.  3. The mitral valve is degenerative. Mild mitral valve  regurgitation. No evidence of mitral stenosis.  4. The aortic valve is tricuspid. There is mild calcification of the aortic valve. There is mild thickening of the aortic valve. Aortic valve regurgitation is not visualized. Aortic valve sclerosis/calcification is present, without any evidence of aortic stenosis.  5. The inferior vena cava is normal in size with greater than 50% respiratory variability, suggesting right atrial pressure of 3 mmHg. Comparison(s): No significant change from prior study.  Conclusion(s)/Recommendation(s): No intracardiac source of embolism detected on this transthoracic study. Consider a transesophageal echocardiogram to exclude cardiac source of embolism if clinically indicated. FINDINGS  Left Ventricle: Left ventricular ejection fraction, by estimation, is 65 to 70%. The left ventricle has normal function. The left ventricle has no regional wall motion abnormalities. The left ventricular internal cavity size was normal in size. There is  no left ventricular hypertrophy. Left ventricular diastolic parameters were normal. Right Ventricle: The right ventricular size is normal. No increase in right ventricular wall thickness. Right ventricular systolic function is normal. Tricuspid regurgitation signal is inadequate for assessing PA pressure. Left [facility]: Left atrial size was normal in size. Right [facility]: Right atrial size was normal in size. Prominent Crista terminalis. Pericardium: There is no evidence of pericardial effusion. Mitral Valve: The mitral valve is degenerative in appearance. There is mild calcification of the anterior mitral valve leaflet(s). Mild mitral valve regurgitation. No evidence of mitral valve stenosis. Tricuspid Valve: The tricuspid valve is grossly normal. Tricuspid valve regurgitation is trivial. No evidence of tricuspid stenosis. Aortic Valve: The aortic valve is tricuspid. There is mild calcification of the aortic valve. There is mild thickening of the aortic valve. Aortic valve regurgitation is not visualized. Aortic valve sclerosis/calcification is present, without any evidence of aortic stenosis. Aortic valve peak gradient measures 7.6 mmHg. Pulmonic Valve: The pulmonic valve was grossly normal. Pulmonic valve regurgitation is trivial. No evidence of pulmonic stenosis. Aorta: The aortic root and ascending aorta are structurally normal, with no evidence of dilitation. Venous: The right lower pulmonary vein is normal. The inferior vena cava is normal in  size with greater than 50% respiratory variability, suggesting right atrial pressure of 3 mmHg. IAS/Shunts: The atrial septum is grossly normal.  LEFT VENTRICLE PLAX 2D LVIDd:         4.50 cm      Diastology LVIDs:         2.40 cm      LV e' medial:    7.10 cm/s LV PW:         0.90 cm      LV E/e' medial:  13.5 LV IVS:        0.90 cm      LV e' lateral:   6.31 cm/s LVOT diam:     2.10 cm      LV E/e' lateral: 15.2 LV SV:         88 LV SV Index:   45 LVOT Area:     3.46 cm  LV Volumes (MOD) LV vol d, MOD A2C: 118.0 ml LV vol d, MOD A4C: 100.0 ml LV vol s, MOD A2C: 35.3 ml LV vol s, MOD A4C: 29.0 ml LV SV MOD A2C:     82.7 ml LV SV MOD A4C:     100.0 ml LV SV MOD BP:      76.4 ml RIGHT VENTRICLE             IVC RV Basal diam:  2.60 cm     IVC diam: 1.80  cm RV S prime:     10.50 cm/s TAPSE (M-mode): 2.0 cm LEFT ATRIUM             Index        RIGHT ATRIUM           Index LA diam:        3.90 cm 2.01 cm/m   RA Area:     16.90 cm LA Vol (A2C):   43.0 ml 22.17 ml/m  RA Volume:   43.80 ml  22.58 ml/m LA Vol (A4C):   40.3 ml 20.78 ml/m LA Biplane Vol: 44.8 ml 23.10 ml/m  AORTIC VALVE AV Area (Vmax): 2.26 cm AV Vmax:        138.00 cm/s AV Peak Grad:   7.6 mmHg LVOT Vmax:      90.10 cm/s LVOT Vmean:     68.300 cm/s LVOT VTI:       0.253 m  AORTA Ao Root diam: 2.80 cm Ao Asc diam:  2.90 cm MITRAL VALVE MV Area (PHT): 3.76 cm     SHUNTS MV Decel Time: 202 msec     Systemic VTI:  0.25 m MV E velocity: 96.20 cm/s   Systemic Diam: 2.10 cm MV A velocity: 115.00 cm/s MV E/A ratio:  0.84 Lennie Odor MD Electronically signed by Lennie Odor MD Signature Date/Time: 01/29/2023/3:24:40 PM    Final    MR BRAIN WO CONTRAST  Result Date: 01/28/2023 CLINICAL DATA:  Left-sided facial droop, stroke suspected EXAM: MRI HEAD WITHOUT CONTRAST TECHNIQUE: Multiplanar, multiecho pulse sequences of the brain and surrounding structures were obtained without intravenous contrast. COMPARISON:  08/28/2020 MRI head, correlation is also made  with 01/28/2023 CT head FINDINGS: Evaluation is limited by motion. Brain: No restricted diffusion to suggest acute or subacute infarct. No acute hemorrhage, mass, mass effect, or midline shift. No hydrocephalus or extra-axial collection. No hemosiderin deposition to suggest remote hemorrhage. Age related cerebral atrophy. T2 hyperintense signal in the periventricular white matter, likely the sequela of mild-to-moderate chronic small vessel ischemic disease. Vascular: Unable to evaluate due to motion. Skull and upper cervical spine: Normal marrow signal. Sinuses/Orbits: Mucosal thickening in the left maxillary sinus. No acute finding in the orbits. Other: The mastoid air cells are well aerated. IMPRESSION: Evaluation is limited by motion. Within this limitation, no acute intracranial process. No evidence of acute or subacute infarct. Electronically Signed   By: Wiliam Ke M.D.   On: 01/28/2023 22:25   CT HEAD CODE STROKE WO CONTRAST  Result Date: 01/28/2023 CLINICAL DATA:  Code stroke.  Left-sided facial droop EXAM: CT HEAD WITHOUT CONTRAST TECHNIQUE: Contiguous axial images were obtained from the base of the skull through the vertex without intravenous contrast. RADIATION DOSE REDUCTION: This exam was performed according to the departmental dose-optimization program which includes automated exposure control, adjustment of the mA and/or kV according to patient size and/or use of iterative reconstruction technique. COMPARISON:  CT Head 08/28/20 FINDINGS: Brain: No hemorrhage. No hydrocephalus. No extra-axial fluid collection. No CT evidence of an acute cortical infarct. No mass effect. No mass lesion. Assessment of the brainstem is limited due to streak artifact. Vascular: Dense atherosclerotic calcifications in the V4 segments bilateral vertebral arteries. Skull: Normal. Negative for fracture or focal lesion. Sinuses/Orbits: No middle ear or mastoid effusion. Paranasal sinuses are clear. Bilateral lens  replacement. Orbits are otherwise unremarkable. Other: None ASPECTS ([place] Stroke Program Early CT Score): 10 IMPRESSION: No hemorrhage or CT evidence of an acute cortical infarct. Aspects 10. Findings  were paged to Dr. Otelia Limes on 01/28/23 at 5:37 PM via [facility] paging system. Electronically Signed   By: Lorenza Cambridge M.D.   On: 01/28/2023 17:40    Microbiology: Results for orders placed or performed during the hospital encounter of 08/28/20  Urine culture     Status: Abnormal   Collection Time: 08/28/20  3:13 PM   Specimen: Urine, Random  Result Value Ref Range Status   Specimen Description URINE, RANDOM  Final   Special Requests   Final    NONE Performed at [facility], [address], [place], [place] [address]    Culture >=100,000 COLONIES/mL ESCHERICHIA COLI (A)  Final   Report Status 09/01/2020 FINAL  Final   Organism ID, Bacteria ESCHERICHIA COLI (A)  Final      Susceptibility   Escherichia coli - MIC*    AMPICILLIN >=32 RESISTANT Resistant     CEFAZOLIN <=4 SENSITIVE Sensitive     CEFEPIME <=0.12 SENSITIVE Sensitive     CEFTRIAXONE <=0.25 SENSITIVE Sensitive     CIPROFLOXACIN <=0.25 SENSITIVE Sensitive     GENTAMICIN <=1 SENSITIVE Sensitive     IMIPENEM <=0.25 SENSITIVE Sensitive     NITROFURANTOIN <=16 SENSITIVE Sensitive     TRIMETH/SULFA <=20 SENSITIVE Sensitive     AMPICILLIN/SULBACTAM 8 SENSITIVE Sensitive     PIP/TAZO <=4 SENSITIVE Sensitive     * >=100,000 COLONIES/mL ESCHERICHIA COLI   Labs: CBC: Recent Labs  Lab 01/28/23 1719 01/28/23 1730 01/29/23 0934  WBC 8.8  --  7.5  NEUTROABS 5.5  --  4.4  HGB 13.0 12.2 10.8*  HCT 38.0 36.0 31.2*  MCV 94.8  --  97.2  PLT 304  --  247   Basic Metabolic Panel: Recent Labs  Lab 01/28/23 1719 01/28/23 1730 01/29/23 0934  NA 141 141 143  K 3.8 3.9 3.5  CL 105 106 107  CO2 23  --  23  GLUCOSE 139* 134* 242*  BUN 22 24* 13  CREATININE 1.30* 1.30* 0.77  CALCIUM 10.0  --  8.4*  MG  --   --  1.7   Liver  Function Tests: Recent Labs  Lab 01/28/23 1719 01/29/23 0934  AST 18 19  ALT 16 15  ALKPHOS 84 60  BILITOT 0.8 1.0  PROT 6.8 4.8*  ALBUMIN 4.1 2.9*   CBG: Recent Labs  Lab 01/28/23 1720 01/28/23 1852 01/28/23 2055 01/29/23 0112  GLUCAP 155* 99 109* 130*   Discharge time spent: greater than 30 minutes.  Signed: Marguerita Merles, DO [facility] 01/31/2023

## 2023-01-29 NOTE — Assessment & Plan Note (Signed)
Pt with acute neurologic symptoms mimicking an acute right MCA CVA when her SBP <100. This quickly resolved with IVF. MRI brian negative for CVA. Pt seen by neurology. No further intervention needed. Will stop ARB for now.

## 2023-01-29 NOTE — Assessment & Plan Note (Signed)
Continue welchol. On repatha at home. Pt has reported allergies to statins.

## 2023-01-29 NOTE — Assessment & Plan Note (Signed)
Will stop ARB. Need to keep SBP >=120 at minimum. Continue with cardizem CD

## 2023-01-29 NOTE — Progress Notes (Signed)
OT Cancellation Note  Patient Details Name: Whitney Newton MRN: [id number] DOB: Aug 08, 1940   Cancelled Treatment:    Reason Eval/Treat Not Completed: OT screened, no needs identified, will sign off Patient actively working with PT upon OT arrival. PT stating no OT needs. OT screened, OT will sign off at this time.  Pollyann Glen E. Brylee Berk, OTR/L Acute Rehabilitation Services [phone]   Cristen Egbers 01/29/2023, 1:30 PM

## 2023-01-29 NOTE — Evaluation (Signed)
Physical Therapy Evaluation Patient Details Name: Whitney Newton MRN: [id number] DOB: 1941-05-27 Today's Date: 01/29/2023  History of Present Illness  Patient is a 82 yo female presenting to the ED with L sided weakness and facial droop on 01/28/2023. CT negative, MRI limited by motion but no acute infarct noted. PMH includes: R MCA stenosis, history of stroke, CHF, DM, HLD, HTN, hypothyroidism, diverticulitis, prior back surgery, and hemangioma of liver  Clinical Impression  Pt is at or close to baseline functioning and should be safe at home with PRN assist of dtr or friend. There are no further acute PT needs.  Will sign off at this time.         If plan is discharge home, recommend the following:     Can travel by private vehicle        Equipment Recommendations None recommended by PT  Recommendations for Other Services       Functional Status Assessment Patient has had a recent decline in their functional status and demonstrates the ability to make significant improvements in function in a reasonable and predictable amount of time.     Precautions / Restrictions Precautions Precautions: Fall      Mobility  Bed Mobility Overal bed mobility: Needs Assistance Bed Mobility: Supine to Sit     Supine to sit: Min assist     General bed mobility comments: light min due to no rail and narrow plinth    Transfers Overall transfer level: Needs assistance   Transfers: Sit to/from Stand Sit to Stand: Contact guard assist           General transfer comment: cues for hand placement    Ambulation/Gait   Gait Distance (Feet): 70 Feet Assistive device: Rolling walker (2 wheels) Gait Pattern/deviations: Step-through pattern   Gait velocity interpretation: <1.8 ft/sec, indicate of risk for recurrent falls   General Gait Details: generally steady with RW which is not AD of choice for her.  Slower, but uses AD well.  Stairs            Wheelchair Mobility      Tilt Bed    Modified Rankin (Stroke Patients Only) Modified Rankin (Stroke Patients Only) Pre-Morbid Rankin Score: No symptoms Modified Rankin: No symptoms     Balance Overall balance assessment: No apparent balance deficits (not formally assessed)                                           Pertinent Vitals/Pain Pain Assessment Pain Assessment: No/denies pain    Home Living Family/patient expects to be discharged to:: Private residence Living Arrangements:  (dtr moved into patient's home now) Available Help at Discharge: Family;Available PRN/intermittently Type of Home: House Home Access: Stairs to enter       Home Layout: One level Home Equipment: Rollator (4 wheels);Shower seat      Prior Function Prior Level of Function : Independent/Modified Independent             Mobility Comments: Independent with rollator ADLs Comments: Independent with ADL's     Extremity/Trunk Assessment   Upper Extremity Assessment Upper Extremity Assessment: Overall WFL for tasks assessed    Lower Extremity Assessment Lower Extremity Assessment: Overall WFL for tasks assessed    Cervical / Trunk Assessment Cervical / Trunk Assessment: Normal  Communication   Communication Communication: No apparent difficulties  Cognition Arousal: Alert Behavior During  Therapy: WFL for tasks assessed/performed Overall Cognitive Status: Within Functional Limits for tasks assessed                                          General Comments General comments (skin integrity, edema, etc.): vss    Exercises     Assessment/Plan    PT Assessment Patient does not need any further PT services  PT Problem List Decreased activity tolerance       PT Treatment Interventions      PT Goals (Current goals can be found in the Care Plan section)  Acute Rehab PT Goals PT Goal Formulation: All assessment and education complete, DC therapy    Frequency        Co-evaluation               AM-PAC PT "6 Clicks" Mobility  Outcome Measure Help needed turning from your back to your side while in a flat bed without using bedrails?: A Little Help needed moving from lying on your back to sitting on the side of a flat bed without using bedrails?: A Little Help needed moving to and from a bed to a chair (including a wheelchair)?: A Little Help needed standing up from a chair using your arms (e.g., wheelchair or bedside chair)?: A Little Help needed to walk in hospital room?: A Little Help needed climbing 3-5 steps with a railing? : A Little 6 Click Score: 18    End of Session   Activity Tolerance: Patient tolerated treatment well Patient left: Other (comment);with call bell/phone within reach (left on toilet with call bell and friend close by.  RN notified.) Nurse Communication: Mobility status PT Visit Diagnosis: Unsteadiness on feet (R26.81)    Time: 9604-5409 PT Time Calculation (min) (ACUTE ONLY): 34 min   Charges:   PT Evaluation $PT Eval Moderate Complexity: 1 Mod PT Treatments $Gait Training: 8-22 mins PT General Charges $$ ACUTE PT VISIT: 1 Visit         01/29/2023  Jacinto Halim., PT Acute Rehabilitation Services [phone]  (office)  Eliseo Gum Cobe Viney 01/29/2023, 2:20 PM

## 2023-01-29 NOTE — Assessment & Plan Note (Signed)
Pt with known right MCA stenosis based on studies at [facility] 05-2022 and 12-2022.  Given her stroke-like symptoms with left facial droop, left sided hemiparesis, her stenosis of her right MCA is likely significant given that she could not tolerated SBP <100 without significant neurologic effects. MRI brain is negative and her symptoms quickly resolved with IVF and keep her SBP >100.  Discussed with pt and dtr(debbie). Only two real choices.  1. Keep her SBP higher than 120 and make she is is not dehydrated so that her BP is high enough to overcome the MCA stenosis. 2. Reach out to neurologist at [facility] to consider intervention on MCA stenosis.  Dtr states she will contact pt's neurologist at [facility] and discuss with them after discharge from hospital.  For the time being, will stop her ARB and only let her use demadex as needed for LE edema.  Will need to make sure that SBP is at minimum 120.

## 2023-01-29 NOTE — Assessment & Plan Note (Signed)
On lantus and SSI. Check A1c.

## 2023-01-29 NOTE — Assessment & Plan Note (Signed)
Observation med/tele bed. Scr 1.3. baseline Scr 1.07. may be caused by recent hypotension vs cozaar/demadex. Will hold ARB and diuretics.  Pt given IVF. Repeat BMP around 9 am.

## 2023-01-29 NOTE — Evaluation (Signed)
Clinical/Bedside Swallow Evaluation Patient Details  Name: Whitney Newton MRN: [id number] Date of Birth: [birth year]  Today's Date: 01/29/2023 Time: SLP Start Time (ACUTE ONLY): 0945 SLP Stop Time (ACUTE ONLY): 0953 SLP Time Calculation (min) (ACUTE ONLY): 8 min  Past Medical History:  Past Medical History:  Diagnosis Date   Acute diastolic (congestive) heart failure (HCC) 12/25/2015   Back pain    prior back surgery in 2009 - slow to recover   Cancer [facility])    Cerebral infarct (HCC) 05/12/2013   Diabetes (HCC)    Diverticulitis    Gastritis    Hemangioma of liver    managed conservatively; followed at [facility]   HLD (hyperlipidemia)    statin intolerant   Hypertension    Normal cardiac stress test 06/10/2009   Obesity    Thyroid disease    hypothyroidism   Past Surgical History:  Past Surgical History:  Procedure Laterality Date   ABDOMINAL HYSTERECTOMY     APPENDECTOMY     LEFT HEART CATH AND CORONARY ANGIOGRAPHY N/A 02/11/2018   Procedure: LEFT HEART CATH AND CORONARY ANGIOGRAPHY;  Surgeon: Lennette Bihari, MD;  Location: [facility] INVASIVE CV LAB;  Service: Cardiovascular;  Laterality: N/A;   NASAL RECONSTRUCTION     HPI:  82 year old Caucasian female prior history of stroke, known right MCA stenosis, hypertension, type 2 diabetes, hyperlipidemia, presents to the ER today with strokelike symptoms.  She had left facial drooping, left hemiparesis.  She had difficulty talking, drooling. MRI Evaluation is limited by motion. Within this limitation, no acute  intracranial process. No evidence of acute or subacute infarct.    Assessment / Plan / Recommendation  Clinical Impression  Pt states from prior stroke she has to make sure to take small sips and tuck her chin at times or she has difficulty. She is alert, follows commands for stable oromotor exam without weakness. She is missing several dentition, volitional cough is strong. She did not exhibit s/s aspiration during todays assessment  following her compensatory strategies (several times she didn't tuck her chin but without difficulty).Mastication with solid functional and cleared oral cavity. Recommend she continue regular texture, thin liquids, pills with thin. She did belch and endorses GERD and is on medication per son. No further ST needed at this time. SLP Visit Diagnosis: Dysphagia, unspecified (R13.10)    Aspiration Risk  Mild aspiration risk    Diet Recommendation Regular;Thin liquid    Liquid Administration via: Straw;Cup Medication Administration: Whole meds with liquid Supervision: Patient able to self feed Compensations: Slow rate;Small sips/bites Postural Changes: Seated upright at 90 degrees;Remain upright for at least 30 minutes after po intake    Other  Recommendations Oral Care Recommendations: Oral care BID    Recommendations for follow up therapy are one component of a multi-disciplinary discharge planning process, led by the attending physician.  Recommendations may be updated based on patient status, additional functional criteria and insurance authorization.  Follow up Recommendations No SLP follow up      Assistance Recommended at Discharge    Functional Status Assessment Patient has not had a recent decline in their functional status  Frequency and Duration            Prognosis        Swallow Study   General HPI: 82 year old Caucasian female prior history of stroke, known right MCA stenosis, hypertension, type 2 diabetes, hyperlipidemia, presents to the ER today with strokelike symptoms.  She had left facial drooping, left hemiparesis.  She had  difficulty talking, drooling. MRI Evaluation is limited by motion. Within this limitation, no acute  intracranial process. No evidence of acute or subacute infarct. Type of Study: Bedside Swallow Evaluation Previous Swallow Assessment:  (none) Diet Prior to this Study: Regular;Thin liquids (Level 0) Temperature Spikes Noted: No Respiratory  Status: Room air History of Recent Intubation: No Behavior/Cognition: Alert;Cooperative;Pleasant mood Oral Cavity Assessment: Within Functional Limits Oral Care Completed by SLP: No Oral Cavity - Dentition:  (missing several) Vision: Functional for self-feeding Self-Feeding Abilities: Able to feed self Patient Positioning: Upright in bed Baseline Vocal Quality: Normal Volitional Cough: Strong Volitional Swallow: Able to elicit    Oral/Motor/Sensory Function Overall Oral Motor/Sensory Function: Within functional limits   Ice Chips Ice chips: Not tested   Thin Liquid Thin Liquid: Within functional limits Presentation: Cup;Straw    Nectar Thick Nectar Thick Liquid: Not tested   Honey Thick Honey Thick Liquid: Not tested   Puree Puree: Within functional limits   Solid     Solid: Within functional limits      Royce Macadamia 01/29/2023,10:04 AM

## 2023-01-29 NOTE — H&P (Signed)
History and Physical    Whitney Newton UVO:[id number] DOB: 23-Jan-1941 DOA: 01/28/2023  DOS: the patient was seen and examined on 01/28/2023  PCP: Irven Coe, MD   Patient coming from: Home  I have personally briefly reviewed patient's old medical records in [place] [facility]  CC: stroke symptoms HPI: 82 year old Caucasian female prior history of stroke, known right MCA stenosis, hypertension, type 2 diabetes, hyperlipidemia, presents to the ER today with strokelike symptoms.  She had left facial drooping, left hemiparesis.  She had difficulty talking.  This was noted by the daughter.  Patient was drooling.  This was around 4:30 PM.  EMS was activated.  Her blood pressure was noted to be 88/50 at the time of EMS arrival.  Patient received some IV fluids and her blood pressure improved to 108/70.  Patient's symptoms improved after blood pressure was greater than 100.  Code stroke was called at time of admission the ER.  Patient had a stroke in 2005 and then again in 2014.  She is followed by [facility] neurology.  She has a known right MCA stenosis.  This was followed on a regular basis.  There is never been discussion on intervention of her stenosis in the past.  White count 8.8, hemoglobin 13, platelets of 304  Sodium 141, potassium 3.8, chloride of 105, bicarb 23, BUN of 22, creatinine 1.3  Labs from March 2024 showed a BUN of 23 and a creatinine 1.07  Code stroke CT was negative for acute infarct or hemorrhage.  MRI brain without contrast was limited by motion.  There is no acute infarct seen.  Patient's neurologic symptoms had completely resolved by this time.  Neurology saw the patient and felt that her transient hypotension was the cause of her acute neurologic symptoms.  No further workup was recommended.  [facility] hospitalist consulted due to acute kidney injury.   ED Course: CT head negative for acute CVA, MRI brain negative for acute CVA. Scr 1.3  Review of Systems:  Review  of Systems  Constitutional: Negative.   HENT: Negative.    Eyes: Negative.   Respiratory: Negative.    Cardiovascular: Negative.   Gastrointestinal: Negative.   Genitourinary: Negative.   Musculoskeletal: Negative.   Skin: Negative.   Neurological:  Positive for speech change, focal weakness and weakness.       Left facial droop, left hemiparesis, aphasia  Endo/Heme/Allergies: Negative.   Psychiatric/Behavioral: Negative.    All other systems reviewed and are negative.   Past Medical History:  Diagnosis Date   Acute diastolic (congestive) heart failure (HCC) 12/25/2015   Back pain    prior back surgery in 2009 - slow to recover   Cancer [facility])    Cerebral infarct (HCC) 05/12/2013   Diabetes (HCC)    Diverticulitis    Gastritis    Hemangioma of liver    managed conservatively; followed at [facility]   HLD (hyperlipidemia)    statin intolerant   Hypertension    Normal cardiac stress test 06/10/2009   Obesity    Thyroid disease    hypothyroidism    Past Surgical History:  Procedure Laterality Date   ABDOMINAL HYSTERECTOMY     APPENDECTOMY     LEFT HEART CATH AND CORONARY ANGIOGRAPHY N/A 02/11/2018   Procedure: LEFT HEART CATH AND CORONARY ANGIOGRAPHY;  Surgeon: Lennette Bihari, MD;  Location: [facility] INVASIVE CV LAB;  Service: Cardiovascular;  Laterality: N/A;   NASAL RECONSTRUCTION       reports that she has never  smoked. She has never used smokeless tobacco. She reports that she does not drink alcohol and does not use drugs.  Allergies  Allergen Reactions   Celecoxib Shortness Of Breath   Lipitor [Atorvastatin Calcium] Shortness Of Breath and Other (See Comments)    CHEST PAIN   Zetia [Ezetimibe] Shortness Of Breath and Other (See Comments)    NO SLEEP   Ropinirole Hcl Other (See Comments)    Restless legs syndrome.   Actos [Pioglitazone Hydrochloride] Swelling   Crestor [Rosuvastatin Calcium] Other (See Comments)    LEG PAIN   Demeclocycline     gastritis    Doxycycline Other (See Comments)    gastritis   Duloxetine Nausea Only   Fenofibrate     Unknown reaction   Imdur [Isosorbide Nitrate] Diarrhea    Headache, stiff neck   Metformin And Related Swelling    Weight gain   Metoprolol Succinate [Metoprolol] Other (See Comments)    Joint pain   Pravachol [Pravastatin]     Joint pain   Ranolazine Er Other (See Comments)    EDEMA   Rosuvastatin Other (See Comments)    LEG PAIN   Niaspan [Niacin] Rash    Flushing     Family History  Problem Relation Age of Onset   Stroke Mother    Hypertension Mother    Heart disease Father    Heart attack Father    Dementia Father    Heart disease Brother    Heart attack Brother     Prior to Admission medications   Medication Sig Start Date End Date Taking? Authorizing Provider  aspirin 81 MG tablet Take 81 mg by mouth daily.   Yes [provider]  clopidogrel (PLAVIX) 75 MG tablet Take 1 tablet (75 mg total) by mouth daily. 05/27/22  Yes Hilty, Lisette Abu, MD  colesevelam [facility]) 625 MG tablet Take 3 tablets (1,875 mg total) by mouth 2 (two) times daily as needed (cholesterol). 02/15/15  Yes Cassell Clement, MD  Cyanocobalamin (VITAMIN B-12) 5000 MCG TBDP Take 5,000 mcg by mouth daily.   Yes [provider]  diltiazem (CARDIZEM CD) 120 MG 24 hr capsule Take 1 capsule (120 mg total) by mouth daily. 12/18/22  Yes Hilty, Lisette Abu, MD  Evolocumab 140 MG/ML SOAJ Inject 140 mg into the skin every 14 (fourteen) days. 11/22/22  Yes Hilty, Lisette Abu, MD  famotidine (PEPCID) 10 MG tablet Take 10 mg by mouth daily in the afternoon.   Yes [provider]  gabapentin (NEURONTIN) 100 MG capsule Take 3 capsules (300mg ) by mouth at night and take additional 1-3 capsules daily as needed for breakthrough. 07/03/22  Yes Hilty, Lisette Abu, MD  HUMALOG KWIKPEN 100 UNIT/ML KiwkPen Inject 14 Units into the skin See admin instructions. Inject 11 units SQ at breakfast, inject 14 units SQ at lunch  and inject 16 units SQ at supper. Plus sliding scale 1 unit for every 25 BS is > 120 08/03/16  Yes [provider]  Hyoscyamine Sulfate 0.375 MG TBCR Take 1 tablet every 12 hours by oral route.   Yes [provider]  levothyroxine (SYNTHROID, LEVOTHROID) 88 MCG tablet Take 88 mcg by mouth daily.  07/28/14  Yes [provider]  losartan (COZAAR) 25 MG tablet Take 2 tablets (50 mg total) by mouth daily. 08/19/22  Yes Hilty, Lisette Abu, MD  potassium chloride SA (KLOR-CON M) 20 MEQ tablet Take 1 tablet (20 mEq total) by mouth daily in the afternoon. 02/14/22  Yes Hilty,  Lisette Abu, MD  torsemide (DEMADEX) 20 MG tablet Take 10 mg by mouth daily as needed.   Yes [provider]  TOUJEO SOLOSTAR 300 UNIT/ML SOPN Inject 9 Units into the skin. 02/19/19  Yes [provider]  diclofenac sodium (VOLTAREN) 1 % GEL Apply 1 application topically at bedtime as needed (for pain).    [provider]  Glucagon 0.5 MG/0.1ML SOAJ     [provider]  Liniments (SALONPAS EX) Apply 1 patch topically daily as needed (for pain).    [provider]  nitroGLYCERIN (NITROSTAT) 0.4 MG SL tablet Place 1 tablet (0.4 mg total) under the tongue every 5 (five) minutes as needed for chest pain. 08/09/19   Hilty, Lisette Abu, MD  ondansetron (ZOFRAN ODT) 4 MG disintegrating tablet Take 1 tablet (4 mg total) by mouth every 8 (eight) hours as needed for nausea or vomiting. 08/28/20   Milagros Loll, MD    Physical Exam: Vitals:   01/28/23 2330 01/29/23 0000 01/29/23 0030 01/29/23 0100  BP: (!) 143/50 (!) 150/52 (!) 152/50 (!) 159/48  Pulse: 68 62 (!) 53 64  Resp: 15 14 11 14   Temp:      TempSrc:      SpO2: 99% 96% 100% 90%  Weight:        Physical Exam Vitals and nursing note reviewed.  Constitutional:      General: She is not in acute distress.    Appearance: She is not toxic-appearing or diaphoretic.  HENT:     Head: Normocephalic and atraumatic.     Nose:  Nose normal.  Eyes:     General: No scleral icterus. Cardiovascular:     Rate and Rhythm: Normal rate and regular rhythm.     Pulses: Normal pulses.  Pulmonary:     Effort: Pulmonary effort is normal.     Breath sounds: Normal breath sounds.  Abdominal:     General: Bowel sounds are normal. There is no distension.     Tenderness: There is no abdominal tenderness.  Musculoskeletal:     Comments: Non-pitting bilateral LE edema. Dtr states that this level on non-pitting edema is better than normal for the patient.  Skin:    General: Skin is warm and dry.     Capillary Refill: Capillary refill takes less than 2 seconds.  Neurological:     General: No focal deficit present.     Mental Status: She is alert and oriented to person, place, and time.      Labs on Admission: I have personally reviewed following labs and imaging studies  CBC: Recent Labs  Lab 01/28/23 1719 01/28/23 1730  WBC 8.8  --   NEUTROABS 5.5  --   HGB 13.0 12.2  HCT 38.0 36.0  MCV 94.8  --   PLT 304  --    Basic Metabolic Panel: Recent Labs  Lab 01/28/23 1719 01/28/23 1730  NA 141 141  K 3.8 3.9  CL 105 106  CO2 23  --   GLUCOSE 139* 134*  BUN 22 24*  CREATININE 1.30* 1.30*  CALCIUM 10.0  --    GFR: Estimated Creatinine Clearance: 35.8 mL/min (A) (by C-G formula based on SCr of 1.3 mg/dL (H)). Liver Function Tests: Recent Labs  Lab 01/28/23 1719  AST 18  ALT 16  ALKPHOS 84  BILITOT 0.8  PROT 6.8  ALBUMIN 4.1   Coagulation Profile: Recent Labs  Lab 01/28/23 1719  INR 1.0   BNP (last 3 results)  Recent Labs    02/21/22 1224  BNP 17.9   HbA1C: No results for input(s): "HGBA1C" in the last 72 hours. CBG: Recent Labs  Lab 01/28/23 1720 01/28/23 1852 01/28/23 2055 01/29/23 0112  GLUCAP 155* 99 109* 130*   Urine analysis:    Component Value Date/Time   COLORURINE AMBER (A) 01/28/2023 1838   APPEARANCEUR HAZY (A) 01/28/2023 1838   LABSPEC 1.025 01/28/2023 1838   PHURINE  5.0 01/28/2023 1838   GLUCOSEU 150 (A) 01/28/2023 1838   HGBUR NEGATIVE 01/28/2023 1838   BILIRUBINUR SMALL (A) 01/28/2023 1838   KETONESUR 5 (A) 01/28/2023 1838   PROTEINUR NEGATIVE 01/28/2023 1838   UROBILINOGEN 0.2 01/18/2015 0623   NITRITE NEGATIVE 01/28/2023 1838   LEUKOCYTESUR TRACE (A) 01/28/2023 1838    Radiological Exams on Admission: I have personally reviewed images MR BRAIN WO CONTRAST  Result Date: 01/28/2023 CLINICAL DATA:  Left-sided facial droop, stroke suspected EXAM: MRI HEAD WITHOUT CONTRAST TECHNIQUE: Multiplanar, multiecho pulse sequences of the brain and surrounding structures were obtained without intravenous contrast. COMPARISON:  08/28/2020 MRI head, correlation is also made with 01/28/2023 CT head FINDINGS: Evaluation is limited by motion. Brain: No restricted diffusion to suggest acute or subacute infarct. No acute hemorrhage, mass, mass effect, or midline shift. No hydrocephalus or extra-axial collection. No hemosiderin deposition to suggest remote hemorrhage. Age related cerebral atrophy. T2 hyperintense signal in the periventricular white matter, likely the sequela of mild-to-moderate chronic small vessel ischemic disease. Vascular: Unable to evaluate due to motion. Skull and upper cervical spine: Normal marrow signal. Sinuses/Orbits: Mucosal thickening in the left maxillary sinus. No acute finding in the orbits. Other: The mastoid air cells are well aerated. IMPRESSION: Evaluation is limited by motion. Within this limitation, no acute intracranial process. No evidence of acute or subacute infarct. Electronically Signed   By: Wiliam Ke M.D.   On: 01/28/2023 22:25   CT HEAD CODE STROKE WO CONTRAST  Result Date: 01/28/2023 CLINICAL DATA:  Code stroke.  Left-sided facial droop EXAM: CT HEAD WITHOUT CONTRAST TECHNIQUE: Contiguous axial images were obtained from the base of the skull through the vertex without intravenous contrast. RADIATION DOSE REDUCTION: This exam  was performed according to the departmental dose-optimization program which includes automated exposure control, adjustment of the mA and/or kV according to patient size and/or use of iterative reconstruction technique. COMPARISON:  CT Head 08/28/20 FINDINGS: Brain: No hemorrhage. No hydrocephalus. No extra-axial fluid collection. No CT evidence of an acute cortical infarct. No mass effect. No mass lesion. Assessment of the brainstem is limited due to streak artifact. Vascular: Dense atherosclerotic calcifications in the V4 segments bilateral vertebral arteries. Skull: Normal. Negative for fracture or focal lesion. Sinuses/Orbits: No middle ear or mastoid effusion. Paranasal sinuses are clear. Bilateral lens replacement. Orbits are otherwise unremarkable. Other: None ASPECTS ([place] Stroke Program Early CT Score): 10 IMPRESSION: No hemorrhage or CT evidence of an acute cortical infarct. Aspects 10. Findings were paged to Dr. Otelia Limes on 01/28/23 at 5:37 PM via [facility] paging system. Electronically Signed   By: Lorenza Cambridge M.D.   On: 01/28/2023 17:40    EKG: My personal interpretation of EKG shows: NSR    Assessment/Plan Principal Problem:   AKI (acute kidney injury) (HCC) Active Problems:   Transient hypotension   Middle cerebral artery stenosis, right   Diabetes mellitus type 2, controlled (HCC)   Benign essential HTN   Combined hyperlipidemia associated with type 2 diabetes mellitus (HCC)   History of stroke    Assessment  and Plan: * AKI (acute kidney injury) (HCC) Observation med/tele bed. Scr 1.3. baseline Scr 1.07. may be caused by recent hypotension vs cozaar/demadex. Will hold ARB and diuretics.  Pt given IVF. Repeat BMP around 9 am.  Middle cerebral artery stenosis, right Pt with known right MCA stenosis based on studies at [facility] 05-2022 and 12-2022.  Given her stroke-like symptoms with left facial droop, left sided hemiparesis, her stenosis of her right MCA is likely significant  given that she could not tolerated SBP <100 without significant neurologic effects. MRI brain is negative and her symptoms quickly resolved with IVF and keep her SBP >100.  Discussed with pt and dtr(debbie). Only two real choices.  1. Keep her SBP higher than 120 and make she is is not dehydrated so that her BP is high enough to overcome the MCA stenosis. 2. Reach out to neurologist at [facility] to consider intervention on MCA stenosis.  Dtr states she will contact pt's neurologist at [facility] and discuss with them after discharge from hospital.  For the time being, will stop her ARB and only let her use demadex as needed for LE edema.  Will need to make sure that SBP is at minimum 120.  Transient hypotension Pt with acute neurologic symptoms mimicking an acute right MCA CVA when her SBP <100. This quickly resolved with IVF. MRI brian negative for CVA. Pt seen by neurology. No further intervention needed. Will stop ARB for now.  History of stroke Had right MCA infarct in 2005 and 2014. Followed by [facility] neurology.  Combined hyperlipidemia associated with type 2 diabetes mellitus (HCC) Continue welchol. On repatha at home. Pt has reported allergies to statins.  Benign essential HTN Will stop ARB. Need to keep SBP >=120 at minimum. Continue with cardizem CD  Diabetes mellitus type 2, controlled (HCC) On lantus and SSI. Check A1c.   DVT prophylaxis: SQ Heparin Code Status: Full Code Family Communication: discussed with pt and dtr debbie  Disposition Plan: return home  Consults called: EDP has consulted neurology  Admission status: Observation, Telemetry bed   Carollee Herter, DO [facility] 01/29/2023, 1:59 AM

## 2023-01-29 NOTE — ED Notes (Signed)
ED TO INPATIENT HANDOFF REPORT  ED Nurse Name and Phone #: Beatris Ship RN [phone]  S Name/Age/Gender Whitney Newton 82 y.o. female Room/Bed: 012C/012C  Code Status   Code Status: Full Code  Home/SNF/Other Home Patient oriented to: self, place, time, and situation Is this baseline? Yes   Triage Complete: Triage complete  Chief Complaint AKI (acute kidney injury) (HCC) [N17.9]  Triage Note Pt bib ems from home; LSN 1430; found by daughter at 1630 with L sided weakness, L sided facial droop, lethargy, and slurred speech; bp 88/50; known MCA stenosis; no recent falls; pt more responsive after lying in ambulance; cbg 190, 108/70, HR 76; hx DM   Allergies Allergies  Allergen Reactions   Celecoxib Shortness Of Breath   Lipitor [Atorvastatin Calcium] Shortness Of Breath and Other (See Comments)    CHEST PAIN   Zetia [Ezetimibe] Shortness Of Breath and Other (See Comments)    NO SLEEP   Ropinirole Hcl Other (See Comments)    Restless legs syndrome.   Actos [Pioglitazone Hydrochloride] Swelling   Crestor [Rosuvastatin Calcium] Other (See Comments)    LEG PAIN   Demeclocycline     gastritis   Doxycycline Other (See Comments)    gastritis   Duloxetine Nausea Only   Fenofibrate     Unknown reaction   Imdur [Isosorbide Nitrate] Diarrhea    Headache, stiff neck   Metformin And Related Swelling    Weight gain   Metoprolol Succinate [Metoprolol] Other (See Comments)    Joint pain   Pravachol [Pravastatin]     Joint pain   Ranolazine Er Other (See Comments)    EDEMA   Rosuvastatin Other (See Comments)    LEG PAIN   Niaspan [Niacin] Rash    Flushing     Level of Care/Admitting Diagnosis ED Disposition     ED Disposition  Admit   Condition  --   Comment  Hospital Area: [facility] [[id number]]  Level of Care: Telemetry Medical [[id number]]  May place patient in observation at [facility] or [facility] if equivalent level of care is available:: No  Covid  Evaluation: Asymptomatic - no recent exposure (last 10 days) testing not required  Diagnosis: AKI (acute kidney injury) [facility]) [[id number]]  Admitting Physician: Imogene Burn, ERIC [[id number]]  Attending Physician: Imogene Burn, ERIC [[id number]]          B Medical/Surgery History Past Medical History:  Diagnosis Date   Acute diastolic (congestive) heart failure (HCC) 12/25/2015   Back pain    prior back surgery in 2009 - slow to recover   Cancer [facility])    Cerebral infarct (HCC) 05/12/2013   Diabetes (HCC)    Diverticulitis    Gastritis    Hemangioma of liver    managed conservatively; followed at [facility]   HLD (hyperlipidemia)    statin intolerant   Hypertension    Normal cardiac stress test 06/10/2009   Obesity    Thyroid disease    hypothyroidism   Past Surgical History:  Procedure Laterality Date   ABDOMINAL HYSTERECTOMY     APPENDECTOMY     LEFT HEART CATH AND CORONARY ANGIOGRAPHY N/A 02/11/2018   Procedure: LEFT HEART CATH AND CORONARY ANGIOGRAPHY;  Surgeon: Lennette Bihari, MD;  Location: [facility] INVASIVE CV LAB;  Service: Cardiovascular;  Laterality: N/A;   NASAL RECONSTRUCTION       A IV Location/Drains/Wounds Patient Lines/Drains/Airways Status     Active Line/Drains/Airways     Name Placement date Placement time Site Days  Peripheral IV 01/28/23 18 G Right Antecubital 01/28/23  1756  Antecubital  1            Intake/Output Last 24 hours  Intake/Output Summary (Last 24 hours) at 01/29/2023 1408 Last data filed at 01/29/2023 0114 Gross per 24 hour  Intake 1000.29 ml  Output --  Net 1000.29 ml    Labs/Imaging Results for orders placed or performed during the hospital encounter of 01/28/23 (from the past 48 hour(s))  Ethanol     Status: None   Collection Time: 01/28/23  5:19 PM  Result Value Ref Range   Alcohol, Ethyl (B) <10 <10 mg/dL    Comment: (NOTE) Lowest detectable limit for serum alcohol is 10 mg/dL.  For medical purposes only. Performed at [facility] Lab,  [address], [place], [place] [address]   Protime-INR     Status: None   Collection Time: 01/28/23  5:19 PM  Result Value Ref Range   Prothrombin Time 13.4 11.4 - 15.2 seconds   INR 1.0 0.8 - 1.2    Comment: (NOTE) INR goal varies based on device and disease states. Performed at [facility] Lab, [address] [place], [place] [address]   APTT     Status: Abnormal   Collection Time: 01/28/23  5:19 PM  Result Value Ref Range   aPTT 22 (L) 24 - 36 seconds    Comment: Performed at [facility], [address], [place], [place] [address]  CBC     Status: Abnormal   Collection Time: 01/28/23  5:19 PM  Result Value Ref Range   WBC 8.8 4.0 - 10.5 K/uL   RBC 4.01 3.87 - 5.11 MIL/uL   Hemoglobin 13.0 12.0 - 15.0 g/dL   HCT 91.4 78.2 - 95.6 %   MCV 94.8 80.0 - 100.0 fL   MCH 32.4 26.0 - 34.0 pg   MCHC 34.2 30.0 - 36.0 g/dL   RDW 21.3 (H) 08.6 - 57.8 %   Platelets 304 150 - 400 K/uL   nRBC 0.0 0.0 - 0.2 %    Comment: Performed at [facility], [address] [place], [place] [address]  Differential     Status: None   Collection Time: 01/28/23  5:19 PM  Result Value Ref Range   Neutrophils Relative % 61 %   Neutro Abs 5.5 1.7 - 7.7 K/uL   Lymphocytes Relative 29 %   Lymphs Abs 2.5 0.7 - 4.0 K/uL   Monocytes Relative 7 %   Monocytes Absolute 0.6 0.1 - 1.0 K/uL   Eosinophils Relative 1 %   Eosinophils Absolute 0.1 0.0 - 0.5 K/uL   Basophils Relative 1 %   Basophils Absolute 0.0 0.0 - 0.1 K/uL   Immature Granulocytes 1 %   Abs Immature Granulocytes 0.05 0.00 - 0.07 K/uL    Comment: Performed at [facility] Lab, [address], [place], [place] [address]  Comprehensive metabolic panel     Status: Abnormal   Collection Time: 01/28/23  5:19 PM  Result Value Ref Range   Sodium 141 135 - 145 mmol/L   Potassium 3.8 3.5 - 5.1 mmol/L   Chloride 105 98 - 111 mmol/L   CO2 23 22 - 32 mmol/L   Glucose, Bld 139 (H) 70 - 99 mg/dL    Comment: Glucose reference range  applies only to samples taken after fasting for at least 8 hours.   BUN 22 8 - 23 mg/dL   Creatinine,  Ser 1.30 (H) 0.44 - 1.00 mg/dL   Calcium 46.9 8.9 - 62.9 mg/dL   Total Protein 6.8 6.5 - 8.1 g/dL   Albumin 4.1 3.5 - 5.0 g/dL   AST 18 15 - 41 U/L   ALT 16 0 - 44 U/L   Alkaline Phosphatase 84 38 - 126 U/L   Total Bilirubin 0.8 0.3 - 1.2 mg/dL   GFR, Estimated 41 (L) >60 mL/min    Comment: (NOTE) Calculated using the CKD-EPI Creatinine Equation (2021)    Anion gap 13 5 - 15    Comment: Performed at [facility] Lab, [address], [place], [place] [address]  CBG monitoring, ED     Status: Abnormal   Collection Time: 01/28/23  5:20 PM  Result Value Ref Range   Glucose-Capillary 155 (H) 70 - 99 mg/dL    Comment: Glucose reference range applies only to samples taken after fasting for at least 8 hours.  I-stat chem 8, ED     Status: Abnormal   Collection Time: 01/28/23  5:30 PM  Result Value Ref Range   Sodium 141 135 - 145 mmol/L   Potassium 3.9 3.5 - 5.1 mmol/L   Chloride 106 98 - 111 mmol/L   BUN 24 (H) 8 - 23 mg/dL   Creatinine, Ser 3.24 (H) 0.44 - 1.00 mg/dL   Glucose, Bld 401 (H) 70 - 99 mg/dL    Comment: Glucose reference range applies only to samples taken after fasting for at least 8 hours.   Calcium, Ion 1.29 1.15 - 1.40 mmol/L   TCO2 24 22 - 32 mmol/L   Hemoglobin 12.2 12.0 - 15.0 g/dL   HCT 02.7 25.3 - 66.4 %  Urine rapid drug screen (hosp performed)     Status: None   Collection Time: 01/28/23  6:38 PM  Result Value Ref Range   Opiates NONE DETECTED NONE DETECTED   Cocaine NONE DETECTED NONE DETECTED   Benzodiazepines NONE DETECTED NONE DETECTED   Amphetamines NONE DETECTED NONE DETECTED   Tetrahydrocannabinol NONE DETECTED NONE DETECTED   Barbiturates NONE DETECTED NONE DETECTED    Comment: (NOTE) DRUG SCREEN FOR MEDICAL PURPOSES ONLY.  IF CONFIRMATION IS NEEDED FOR ANY PURPOSE, NOTIFY LAB WITHIN 5 DAYS.  LOWEST DETECTABLE LIMITS FOR URINE DRUG  SCREEN Drug Class                     Cutoff (ng/mL) Amphetamine and metabolites    1000 Barbiturate and metabolites    200 Benzodiazepine                 200 Opiates and metabolites        300 Cocaine and metabolites        300 THC                            50 Performed at [facility] Lab, [address] [place], [place] [address]   Urinalysis, Routine w reflex microscopic -Urine, Clean Catch     Status: Abnormal   Collection Time: 01/28/23  6:38 PM  Result Value Ref Range   Color, Urine AMBER (A) YELLOW    Comment: BIOCHEMICALS MAY BE AFFECTED BY COLOR   APPearance HAZY (A) CLEAR   Specific Gravity, Urine 1.025 1.005 - 1.030   pH 5.0 5.0 - 8.0   Glucose, UA 150 (A) NEGATIVE mg/dL   Hgb urine dipstick NEGATIVE NEGATIVE   Bilirubin Urine  SMALL (A) NEGATIVE   Ketones, ur 5 (A) NEGATIVE mg/dL   Protein, ur NEGATIVE NEGATIVE mg/dL   Nitrite NEGATIVE NEGATIVE   Leukocytes,Ua TRACE (A) NEGATIVE   RBC / HPF 0-5 0 - 5 RBC/hpf   WBC, UA 0-5 0 - 5 WBC/hpf   Bacteria, UA RARE (A) NONE SEEN   Squamous Epithelial / HPF 0-5 0 - 5 /HPF   Hyaline Casts, UA PRESENT     Comment: Performed at [facility] Lab, [address], [place], [place] [address]  CBG monitoring, ED     Status: None   Collection Time: 01/28/23  6:52 PM  Result Value Ref Range   Glucose-Capillary 99 70 - 99 mg/dL    Comment: Glucose reference range applies only to samples taken after fasting for at least 8 hours.  CBG monitoring, ED     Status: Abnormal   Collection Time: 01/28/23  8:55 PM  Result Value Ref Range   Glucose-Capillary 109 (H) 70 - 99 mg/dL    Comment: Glucose reference range applies only to samples taken after fasting for at least 8 hours.   Comment 1 Notify RN   CBG monitoring, ED     Status: Abnormal   Collection Time: 01/29/23  1:12 AM  Result Value Ref Range   Glucose-Capillary 130 (H) 70 - 99 mg/dL    Comment: Glucose reference range applies only to samples taken after fasting for at least  8 hours.   Comment 1 Notify RN   Hemoglobin A1c     Status: Abnormal   Collection Time: 01/29/23  9:33 AM  Result Value Ref Range   Hgb A1c MFr Bld 6.7 (H) 4.8 - 5.6 %    Comment: (NOTE) Pre diabetes:          5.7%-6.4%  Diabetes:              >6.4%  Glycemic control for   <7.0% adults with diabetes    Mean Plasma Glucose 145.59 mg/dL    Comment: Performed at [facility], [address] [place], [place] [address]  Comprehensive metabolic panel     Status: Abnormal   Collection Time: 01/29/23  9:34 AM  Result Value Ref Range   Sodium 143 135 - 145 mmol/L   Potassium 3.5 3.5 - 5.1 mmol/L   Chloride 107 98 - 111 mmol/L   CO2 23 22 - 32 mmol/L   Glucose, Bld 242 (H) 70 - 99 mg/dL    Comment: Glucose reference range applies only to samples taken after fasting for at least 8 hours.   BUN 13 8 - 23 mg/dL   Creatinine, Ser 8.41 0.44 - 1.00 mg/dL   Calcium 8.4 (L) 8.9 - 10.3 mg/dL   Total Protein 4.8 (L) 6.5 - 8.1 g/dL   Albumin 2.9 (L) 3.5 - 5.0 g/dL   AST 19 15 - 41 U/L   ALT 15 0 - 44 U/L   Alkaline Phosphatase 60 38 - 126 U/L   Total Bilirubin 1.0 0.3 - 1.2 mg/dL   GFR, Estimated >32 >44 mL/min    Comment: (NOTE) Calculated using the CKD-EPI Creatinine Equation (2021)    Anion gap 13 5 - 15    Comment: Performed at [facility], [address] [place], [place] [address]  Magnesium     Status: None   Collection Time: 01/29/23  9:34 AM  Result Value Ref Range   Magnesium 1.7 1.7 - 2.4 mg/dL  Comment: Performed at [facility], [address] [place], [place] [address]  CBC with Differential/Platelet     Status: Abnormal   Collection Time: 01/29/23  9:34 AM  Result Value Ref Range   WBC 7.5 4.0 - 10.5 K/uL   RBC 3.21 (L) 3.87 - 5.11 MIL/uL   Hemoglobin 10.8 (L) 12.0 - 15.0 g/dL   HCT 65.7 (L) 84.6 - 96.2 %   MCV 97.2 80.0 - 100.0 fL   MCH 33.6 26.0 - 34.0 pg   MCHC 34.6 30.0 - 36.0 g/dL   RDW 95.2 (H) 84.1 - 32.4 %   Platelets 247 150 - 400 K/uL    nRBC 0.0 0.0 - 0.2 %   Neutrophils Relative % 58 %   Neutro Abs 4.4 1.7 - 7.7 K/uL   Lymphocytes Relative 33 %   Lymphs Abs 2.5 0.7 - 4.0 K/uL   Monocytes Relative 7 %   Monocytes Absolute 0.5 0.1 - 1.0 K/uL   Eosinophils Relative 2 %   Eosinophils Absolute 0.1 0.0 - 0.5 K/uL   Basophils Relative 0 %   Basophils Absolute 0.0 0.0 - 0.1 K/uL   Immature Granulocytes 0 %   Abs Immature Granulocytes 0.01 0.00 - 0.07 K/uL    Comment: Performed at [facility], [address], [place], [place] [address]  Lipid panel     Status: None   Collection Time: 01/29/23  9:34 AM  Result Value Ref Range   Cholesterol 102 0 - 200 mg/dL   Triglycerides 725 <366 mg/dL   HDL 52 >44 mg/dL   Total CHOL/HDL Ratio 2.0 RATIO   VLDL 20 0 - 40 mg/dL   LDL Cholesterol 30 0 - 99 mg/dL    Comment:        Total Cholesterol/HDL:CHD Risk Coronary Heart Disease Risk Table                     Men   Women  1/2 Average Risk   3.4   3.3  Average Risk       5.0   4.4  2 X Average Risk   9.6   7.1  3 X Average Risk  23.4   11.0        Use the calculated Patient Ratio above and the CHD Risk Table to determine the patient's CHD Risk.        ATP III CLASSIFICATION (LDL):  <100     mg/dL   Optimal  034-742  mg/dL   Near or Above                    Optimal  130-159  mg/dL   Borderline  595-638  mg/dL   High  >756     mg/dL   Very High Performed at [facility], [address], [place], [place] [address]    MR BRAIN WO CONTRAST  Result Date: 01/28/2023 CLINICAL DATA:  Left-sided facial droop, stroke suspected EXAM: MRI HEAD WITHOUT CONTRAST TECHNIQUE: Multiplanar, multiecho pulse sequences of the brain and surrounding structures were obtained without intravenous contrast. COMPARISON:  08/28/2020 MRI head, correlation is also made with 01/28/2023 CT head FINDINGS: Evaluation is limited by motion. Brain: No restricted diffusion to suggest acute or subacute infarct. No acute hemorrhage, mass, mass  effect, or midline shift. No hydrocephalus or extra-axial collection. No hemosiderin deposition to suggest remote hemorrhage. Age related cerebral atrophy. T2 hyperintense signal in the periventricular white matter, likely  the sequela of mild-to-moderate chronic small vessel ischemic disease. Vascular: Unable to evaluate due to motion. Skull and upper cervical spine: Normal marrow signal. Sinuses/Orbits: Mucosal thickening in the left maxillary sinus. No acute finding in the orbits. Other: The mastoid air cells are well aerated. IMPRESSION: Evaluation is limited by motion. Within this limitation, no acute intracranial process. No evidence of acute or subacute infarct. Electronically Signed   By: Wiliam Ke M.D.   On: 01/28/2023 22:25   CT HEAD CODE STROKE WO CONTRAST  Result Date: 01/28/2023 CLINICAL DATA:  Code stroke.  Left-sided facial droop EXAM: CT HEAD WITHOUT CONTRAST TECHNIQUE: Contiguous axial images were obtained from the base of the skull through the vertex without intravenous contrast. RADIATION DOSE REDUCTION: This exam was performed according to the departmental dose-optimization program which includes automated exposure control, adjustment of the mA and/or kV according to patient size and/or use of iterative reconstruction technique. COMPARISON:  CT Head 08/28/20 FINDINGS: Brain: No hemorrhage. No hydrocephalus. No extra-axial fluid collection. No CT evidence of an acute cortical infarct. No mass effect. No mass lesion. Assessment of the brainstem is limited due to streak artifact. Vascular: Dense atherosclerotic calcifications in the V4 segments bilateral vertebral arteries. Skull: Normal. Negative for fracture or focal lesion. Sinuses/Orbits: No middle ear or mastoid effusion. Paranasal sinuses are clear. Bilateral lens replacement. Orbits are otherwise unremarkable. Other: None ASPECTS ([place] Stroke Program Early CT Score): 10 IMPRESSION: No hemorrhage or CT evidence of an acute cortical  infarct. Aspects 10. Findings were paged to Dr. Otelia Limes on 01/28/23 at 5:37 PM via [facility] paging system. Electronically Signed   By: Lorenza Cambridge M.D.   On: 01/28/2023 17:40    Pending Labs Unresulted Labs (From admission, onward)    None       Vitals/Pain Today's Vitals   01/29/23 1218 01/29/23 1227 01/29/23 1230 01/29/23 1300  BP: (!) 182/65  (!) 182/63 (!) 181/57  Pulse: 68  67 68  Resp: 20  15 14   Temp:  97.6 F (36.4 C)    TempSrc:  Oral    SpO2: 100%  99% 100%  Weight:      PainSc:        Isolation Precautions No active isolations  Medications Medications  aspirin EC tablet 81 mg (81 mg Oral Given 01/29/23 0929)  colesevelam [facility]) tablet 1,875 mg (has no administration in time range)  diltiazem (CARDIZEM CD) 24 hr capsule 120 mg (120 mg Oral Given 01/29/23 0928)  heparin injection 5,000 Units (5,000 Units Subcutaneous Given 01/29/23 0659)  acetaminophen (TYLENOL) tablet 650 mg (has no administration in time range)    Or  acetaminophen (TYLENOL) suppository 650 mg (has no administration in time range)  ondansetron (ZOFRAN) tablet 4 mg (has no administration in time range)    Or  ondansetron (ZOFRAN) injection 4 mg (has no administration in time range)  melatonin tablet 10 mg (has no administration in time range)  levothyroxine (SYNTHROID) tablet 88 mcg (88 mcg Oral Given 01/29/23 0659)  lactated ringers 1,000 mL with potassium chloride 20 mEq infusion ( Intravenous Rate/Dose Verify 01/29/23 0746)  clopidogrel (PLAVIX) tablet 75 mg (75 mg Oral Given 01/29/23 0929)  magnesium sulfate IVPB 2 g 50 mL (has no administration in time range)  potassium chloride SA (KLOR-CON M) CR tablet 40 mEq (has no administration in time range)  LORazepam (ATIVAN) injection 1 mg (1 mg Intravenous Given 01/28/23 2051)  lactated ringers bolus 1,000 mL (0 mLs Intravenous Stopped 01/29/23 0114)  Mobility walks with device     Focused Assessments Neuro Assessment Handoff:  Swallow  screen pass? Yes    NIH Stroke Scale  Dizziness Present: No Headache Present: No Interval: Shift assessment Level of Consciousness (1a.)   : Alert, keenly responsive LOC Questions (1b. )   : Answers both questions correctly LOC Commands (1c. )   : Performs both tasks correctly Best Gaze (2. )  : Normal Visual (3. )  : No visual loss Facial Palsy (4. )    : Normal symmetrical movements Motor Arm, Left (5a. )   : No drift Motor Arm, Right (5b. ) : No drift Motor Leg, Left (6a. )  : Drift Motor Leg, Right (6b. ) : Drift Limb Ataxia (7. ): Absent Sensory (8. )  : Normal, no sensory loss Best Language (9. )  : No aphasia Dysarthria (10. ): Normal Extinction/Inattention (11.)   : No Abnormality Complete NIHSS TOTAL: 2 Last date known well: 01/28/23 Last time known well: 1430 Neuro Assessment: Within Defined Limits Neuro Checks:   Initial (01/28/23 1725)  Has TPA been given? No If patient is a Neuro Trauma and patient is going to OR before floor call report to 4N Charge nurse: [phone] or [phone]   R Recommendations: See Admitting Provider Note  Report given to: [id number]

## 2023-01-29 NOTE — ED Notes (Signed)
ECHO at bedside.

## 2023-01-31 NOTE — Hospital Course (Signed)
HPI per Dr. Carollee Herter on 01/28/23  82 year old Caucasian female prior history of stroke, known right MCA stenosis, hypertension, type 2 diabetes, hyperlipidemia, presents to the ER today with strokelike symptoms.  She had left facial drooping, left hemiparesis.  She had difficulty talking.  This was noted by the daughter.  Patient was drooling.  This was around 4:30 PM.   EMS was activated.  Her blood pressure was noted to be 88/50 at the time of EMS arrival.  Patient received some IV fluids and her blood pressure improved to 108/70.  Patient's symptoms improved after blood pressure was greater than 100.   Code stroke was called at time of admission the ER.   Patient had a stroke in 2005 and then again in 2014.  She is followed by [facility] neurology.  She has a known right MCA stenosis.  This was followed on a regular basis.  There is never been discussion on intervention of her stenosis in the past.   White count 8.8, hemoglobin 13, platelets of 304   Sodium 141, potassium 3.8, chloride of 105, bicarb 23, BUN of 22, creatinine 1.3   Labs from March 2024 showed a BUN of 23 and a creatinine 1.07   Code stroke CT was negative for acute infarct or hemorrhage.   MRI brain without contrast was limited by motion.  There is no acute infarct seen.   Patient's neurologic symptoms had completely resolved by this time.   Neurology saw the patient and felt that her transient hypotension was the cause of her acute neurologic symptoms.  No further workup was recommended.   [facility] hospitalist consulted due to acute kidney injury.   **Interim History She is rehydrated and kidney function improved and neurology evaluated and her left-sided weakness and slurred speech resolved.  MRI was negative for infarct and the neurologist felt that she had a TIA due to hypotension in the setting of her right CCA and MCA chronic stenosis.  They are recommending aspirin and clopidogrel indefinitely and discharging given her  completed stroke workup and recommending close following with Dr. Cherre Blanc with further neuroimaging per Dr. Cherre Blanc and vascular evaluation at that time as soon as possible.  She is deemed medically stable for discharge and PT OT evaluated and she is stable for discharge at this time.   Assessment and Plan:  * AKI (acute kidney injury) (HCC) Observation med/tele bed. Scr 1.3. baseline Scr 1.07. may be caused by recent hypotension vs cozaar/demadex.  -Will hold ARB and diuretics.   -BUN/Cr Trend: Recent Labs  Lab 01/28/23 1719 01/28/23 1730 01/29/23 0934  BUN 22 24* 13  CREATININE 1.30* 1.30* 0.77  -Avoid Nephrotoxic Medications, Contrast Dyes, Hypotension and Dehydration to Ensure Adequate Renal Perfusion and will need to Renally Adjust Meds -Continue to Monitor and Trend Renal Function carefully and repeat CMP within 1 week ; -Patient's ARB was cut in half and she was told at take her diuretics as needed.  She will need close follow-up and repeat CMP in 1 week  Acute TIA i due to hypotension in the setting of middle cerebral artery stenosis, right -Pt with known right MCA stenosis based on studies at [facility] 05-2022 and 12-2022.  Given her stroke-like symptoms with left facial droop, left sided hemiparesis, her stenosis of her right MCA is likely significant given that she could not tolerated SBP <100 without significant neurologic effects.  -MRI brain is negative and her symptoms quickly resolved with IVF and keep her SBP >100.   -  Discussed with pt and dtr(debbie).  -Keep her SBP higher than 120 and make she is is not dehydrated so that her BP is high enough to overcome the MCA stenosis.  -Underwent a full neurowork-up and CTA head stroke showed no acute intrathoracic cranial abnormality along with MRI -CTA of the head and neck was deferred and patient to follow-up with Dr. Cherre Blanc as an outpatient to have this done -Continue dual antiplatelet therapy -Reach out to neurologist  at [facility] to consider intervention on MCA stenosis. -Dtr states she will contact pt's neurologist at [facility] and discuss with them after discharge from hospital. -For the time being, held her ARB and changed to half a dose and only let her use demadex as needed for LE edema. -Will need to make sure that SBP is at minimum 120.  Transient hypotension -Pt with acute neurologic symptoms mimicking an acute right MCA CVA when her SBP <100. This quickly resolved with IVF. MRI brian negative for CVA. Pt seen by neurology. No further intervention needed.  Will hold ARB while she is AKI and resume at half the dose at discharge  History of stroke -Had right MCA infarct in 2005 and 2014. Followed by [facility] neurology.  Combined hyperlipidemia associated with type 2 diabetes mellitus (HCC) -Continue welchol. On repatha at home. Pt has reported allergies to statins.  She can continue Repatha at discharge  Benign essential HTN -Will reduce ARB. Need to keep SBP >=120 at minimum.  -Continue monitor blood pressures per protocol and follow-up in outpatient setting  Diabetes mellitus type 2, controlled (HCC) -On lantus and SSI. Check A1c.  And was 6.7  Normocytic Anemia -Hgb/Hct Trend: Recent Labs  Lab 01/28/23 1719 01/28/23 1730 01/29/23 0934  HGB 13.0 12.2 10.8*  HCT 38.0 36.0 31.2*  MCV 94.8  --  97.2  -Likely dilutional drop in the setting of IV fluid resuscitation -Check anemia panel in outpatient setting -Continue to monitor and repeat CBC within 1 week  Hypoalbuminemia -Patient's Albumin Trend: Recent Labs  Lab 01/28/23 1719 01/29/23 0934  ALBUMIN 4.1 2.9*  -Continue to Monitor and Trend and repeat CMP in the AM  Obesity -Complicates overall prognosis and care -Estimated body mass index is 35.69 kg/m as calculated from the following:   Height as of 11/15/22: 5\' 3"  (1.6 m).   Weight as of this encounter: 91.4 kg.  -Weight Loss and Dietary Counseling given

## 2023-02-04 ENCOUNTER — Other Ambulatory Visit (HOSPITAL_BASED_OUTPATIENT_CLINIC_OR_DEPARTMENT_OTHER): Payer: Self-pay

## 2023-02-04 ENCOUNTER — Other Ambulatory Visit (HOSPITAL_COMMUNITY): Payer: Self-pay

## 2023-02-11 ENCOUNTER — Other Ambulatory Visit: Payer: Self-pay | Admitting: Internal Medicine

## 2023-02-11 DIAGNOSIS — I10 Essential (primary) hypertension: Secondary | ICD-10-CM | POA: Diagnosis not present

## 2023-02-11 DIAGNOSIS — E039 Hypothyroidism, unspecified: Secondary | ICD-10-CM | POA: Diagnosis not present

## 2023-02-11 DIAGNOSIS — G4733 Obstructive sleep apnea (adult) (pediatric): Secondary | ICD-10-CM | POA: Diagnosis not present

## 2023-02-11 DIAGNOSIS — I471 Supraventricular tachycardia, unspecified: Secondary | ICD-10-CM | POA: Diagnosis not present

## 2023-02-11 DIAGNOSIS — I679 Cerebrovascular disease, unspecified: Secondary | ICD-10-CM | POA: Diagnosis not present

## 2023-02-11 DIAGNOSIS — K219 Gastro-esophageal reflux disease without esophagitis: Secondary | ICD-10-CM | POA: Diagnosis not present

## 2023-02-11 DIAGNOSIS — R609 Edema, unspecified: Secondary | ICD-10-CM | POA: Diagnosis not present

## 2023-02-11 DIAGNOSIS — E119 Type 2 diabetes mellitus without complications: Secondary | ICD-10-CM | POA: Diagnosis not present

## 2023-02-11 DIAGNOSIS — E7800 Pure hypercholesterolemia, unspecified: Secondary | ICD-10-CM | POA: Diagnosis not present

## 2023-02-12 NOTE — Telephone Encounter (Signed)
Patient has requested losartan 25 mg. Dr. Rennis Golden has prescribed it to her before for several years but the last prescription had a stop date of 08/19/2022. Losartan 25 mg was prescribed in at the ER on 01/29/23. I wasn't sure how to proceed with refill.

## 2023-02-12 NOTE — Telephone Encounter (Signed)
Per hospitalist note 01/29/23, hospital AVS, and med list -- losartan 25mg  should be prescribed. Rx(s) sent to pharmacy electronically.   Transient hypotension -Pt with acute neurologic symptoms mimicking an acute right MCA CVA when her SBP <100. This quickly resolved with IVF. MRI brian negative for CVA. Pt seen by neurology. No further intervention needed.  Will hold ARB while she is AKI and resume at half the dose at discharge

## 2023-02-17 DIAGNOSIS — E113311 Type 2 diabetes mellitus with moderate nonproliferative diabetic retinopathy with macular edema, right eye: Secondary | ICD-10-CM | POA: Diagnosis not present

## 2023-02-21 ENCOUNTER — Ambulatory Visit: Payer: Medicare Other | Admitting: Internal Medicine

## 2023-03-18 DIAGNOSIS — E039 Hypothyroidism, unspecified: Secondary | ICD-10-CM | POA: Diagnosis not present

## 2023-03-18 DIAGNOSIS — Z794 Long term (current) use of insulin: Secondary | ICD-10-CM | POA: Diagnosis not present

## 2023-03-18 DIAGNOSIS — E1165 Type 2 diabetes mellitus with hyperglycemia: Secondary | ICD-10-CM | POA: Diagnosis not present

## 2023-04-03 ENCOUNTER — Ambulatory Visit (INDEPENDENT_AMBULATORY_CARE_PROVIDER_SITE_OTHER): Payer: Medicare Other | Admitting: Podiatry

## 2023-04-03 DIAGNOSIS — B351 Tinea unguium: Secondary | ICD-10-CM

## 2023-04-03 DIAGNOSIS — M79675 Pain in left toe(s): Secondary | ICD-10-CM

## 2023-04-03 DIAGNOSIS — M79674 Pain in right toe(s): Secondary | ICD-10-CM

## 2023-04-03 NOTE — Progress Notes (Signed)
Subjective:  Patient ID: Whitney Newton, female    DOB: Nov 11, 1940,  MRN: [id number]  Whitney Newton presents to clinic today for: Patient notes nails are thick, discolored, elongated and painful in shoegear when trying to ambulate.    PCP is Irven Coe, MD.  Past Medical History:  Diagnosis Date   Acute diastolic (congestive) heart failure (HCC) 12/25/2015   Back pain    prior back surgery in 2009 - slow to recover   Cancer [facility])    Cerebral infarct (HCC) 05/12/2013   Diabetes (HCC)    Diverticulitis    Gastritis    Hemangioma of liver    managed conservatively; followed at [facility]   HLD (hyperlipidemia)    statin intolerant   Hypertension    Normal cardiac stress test 06/10/2009   Obesity    Thyroid disease    hypothyroidism    Past Surgical History:  Procedure Laterality Date   ABDOMINAL HYSTERECTOMY     APPENDECTOMY     LEFT HEART CATH AND CORONARY ANGIOGRAPHY N/A 02/11/2018   Procedure: LEFT HEART CATH AND CORONARY ANGIOGRAPHY;  Surgeon: Lennette Bihari, MD;  Location: [facility] INVASIVE CV LAB;  Service: Cardiovascular;  Laterality: N/A;   NASAL RECONSTRUCTION      Allergies  Allergen Reactions   Celecoxib Shortness Of Breath   Lipitor [Atorvastatin Calcium] Shortness Of Breath and Other (See Comments)    CHEST PAIN   Zetia [Ezetimibe] Shortness Of Breath and Other (See Comments)    NO SLEEP   Ropinirole Hcl Other (See Comments)    Restless legs syndrome.   Actos [Pioglitazone Hydrochloride] Swelling   Crestor [Rosuvastatin Calcium] Other (See Comments)    LEG PAIN   Demeclocycline     gastritis   Doxycycline Other (See Comments)    gastritis   Duloxetine Nausea Only   Fenofibrate     Unknown reaction   Imdur [Isosorbide Nitrate] Diarrhea    Headache, stiff neck   Metformin And Related Swelling    Weight gain   Metoprolol Succinate [Metoprolol] Other (See Comments)    Joint pain   Pravachol [Pravastatin]     Joint pain   Ranolazine Er Other (See  Comments)    EDEMA   Rosuvastatin Other (See Comments)    LEG PAIN   Niaspan [Niacin] Rash    Flushing    Review of Systems: Negative except as noted in the HPI.  Objective:  Whitney Newton is a pleasant 82 y.o. female in NAD. AAO x 3.  Vascular Examination: Capillary refill time is 3-5 seconds to toes bilateral. Palpable pedal pulses b/l LE. Digital hair present b/l.  Skin temperature gradient WNL b/l. No varicosities b/l. No cyanosis noted b/l.   Dermatological Examination: Pedal skin with normal turgor, texture and tone b/l. No open wounds. No interdigital macerations b/l. Toenails x10 are 3mm thick, discolored, dystrophic with subungual debris. There is pain with compression of the nail plates.  They are elongated x10     Latest Ref Rng & Units 01/29/2023    9:33 AM  Hemoglobin A1C  Hemoglobin-A1c 4.8 - 5.6 % 6.7    Assessment/Plan: 1. Pain due to onychomycosis of toenails of both feet     The mycotic toenails were sharply debrided x10 with sterile nail nippers and a power debriding burr to decrease bulk/thickness and length.    Return in about 3 months (around 07/04/2023) for [facility]   Clerance Lav, DPM, FACFAS [facility]  [address] [place], [place] [address]                Office [phone]  Fax [phone]

## 2023-04-07 DIAGNOSIS — I471 Supraventricular tachycardia, unspecified: Secondary | ICD-10-CM | POA: Diagnosis not present

## 2023-04-07 DIAGNOSIS — R112 Nausea with vomiting, unspecified: Secondary | ICD-10-CM | POA: Diagnosis not present

## 2023-04-07 DIAGNOSIS — I679 Cerebrovascular disease, unspecified: Secondary | ICD-10-CM | POA: Diagnosis not present

## 2023-04-07 DIAGNOSIS — R110 Nausea: Secondary | ICD-10-CM | POA: Diagnosis not present

## 2023-04-07 DIAGNOSIS — R609 Edema, unspecified: Secondary | ICD-10-CM | POA: Diagnosis not present

## 2023-04-07 DIAGNOSIS — E039 Hypothyroidism, unspecified: Secondary | ICD-10-CM | POA: Diagnosis not present

## 2023-04-07 DIAGNOSIS — R197 Diarrhea, unspecified: Secondary | ICD-10-CM | POA: Diagnosis not present

## 2023-04-07 DIAGNOSIS — E7800 Pure hypercholesterolemia, unspecified: Secondary | ICD-10-CM | POA: Diagnosis not present

## 2023-04-07 DIAGNOSIS — E119 Type 2 diabetes mellitus without complications: Secondary | ICD-10-CM | POA: Diagnosis not present

## 2023-04-07 DIAGNOSIS — I10 Essential (primary) hypertension: Secondary | ICD-10-CM | POA: Diagnosis not present

## 2023-04-11 DIAGNOSIS — Z23 Encounter for immunization: Secondary | ICD-10-CM | POA: Diagnosis not present

## 2023-04-11 DIAGNOSIS — R110 Nausea: Secondary | ICD-10-CM | POA: Diagnosis not present

## 2023-04-11 DIAGNOSIS — K219 Gastro-esophageal reflux disease without esophagitis: Secondary | ICD-10-CM | POA: Diagnosis not present

## 2023-04-18 ENCOUNTER — Ambulatory Visit: Payer: Medicare Other | Attending: Internal Medicine | Admitting: Internal Medicine

## 2023-04-18 ENCOUNTER — Encounter: Payer: Self-pay | Admitting: Internal Medicine

## 2023-04-18 VITALS — BP 146/62 | HR 62 | Ht 63.0 in | Wt 191.0 lb

## 2023-04-18 DIAGNOSIS — D1803 Hemangioma of intra-abdominal structures: Secondary | ICD-10-CM | POA: Diagnosis not present

## 2023-04-18 DIAGNOSIS — I5032 Chronic diastolic (congestive) heart failure: Secondary | ICD-10-CM | POA: Insufficient documentation

## 2023-04-18 DIAGNOSIS — I471 Supraventricular tachycardia, unspecified: Secondary | ICD-10-CM | POA: Diagnosis not present

## 2023-04-18 DIAGNOSIS — I119 Hypertensive heart disease without heart failure: Secondary | ICD-10-CM | POA: Insufficient documentation

## 2023-04-18 DIAGNOSIS — I700 Atherosclerosis of aorta: Secondary | ICD-10-CM | POA: Insufficient documentation

## 2023-04-18 NOTE — Patient Instructions (Signed)
Medication Instructions:  NO CHANGES  *If you need a refill on your cardiac medications before your next appointment, please call your pharmacy*    Follow-Up: At [facility], you and your health needs are our priority.  As part of our continuing mission to provide you with exceptional heart care, we have created designated Provider Care Teams.  These Care Teams include your primary Cardiologist (physician) and Advanced Practice Providers (APPs -  Physician Assistants and Nurse Practitioners) who all work together to provide you with the care you need, when you need it.  We recommend signing up for the patient portal called "MyChart".  Sign up information is provided on this After Visit Summary.  MyChart is used to connect with patients for Virtual Visits (Telemedicine).  Patients are able to view lab/test results, encounter notes, upcoming appointments, etc.  Non-urgent messages can be sent to your provider as well.   To learn more about what you can do with MyChart, go to [email or web address].    Your next appointment:    6 months with Dr. Debara Pickett

## 2023-04-18 NOTE — Progress Notes (Unsigned)
Cardiology Office Note   Date:  04/18/2023   ID:  Whitney Newton, DOB Oct 06, 1940, MRN [id number]  PCP:  Irven Coe, MD   CC: Follow-up  History of Present Illness: Whitney Newton is a 82 y.o. female who presents for  Four-month follow-up visit  This pleasant 82 year old woman is seen for a scheduled followup visit. The patient has a history of having had a stroke in early December 2014. She was at her diabetes clinic at [facility] and they diagnosed her and sent her straight to neurology where she was hospitalized for 3 days. The MRI showed a new stroke on the right side. She had carotid Dopplers which did not show any obstructive lesions but she did have some plaque and they put her back on aspirin and continued her Plavix. The patient had a echocardiogram which was a bubble study and did not show any evidence of right-to-left shunt.  More recently, she has had further neurology workup at [facility] on 06/20/14.  She had a transcranial Doppler.  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  Previous carotid Dopplers had not shown any significant external cranial disease  She has a history of essential hypertension, diabetes mellitus, and dyslipidemia. She has had atypical chest pain. She is intolerant of statin drugs. We updated her lexiscan Myoview stress test on 03/17/12 and it was normal showing no evidence of ischemia and her ejection fraction was 78%. The patient has never had a cardiac catheterization. She has continued to have some shortness of breath. She had an echocardiogram in 04/07/12 which showed an ejection fraction of 55-65% with grade 1 diastolic dysfunction. There is trivial aortic insufficiency.   The patient has a history of essential hypertension as well as atypical chest pain. Her diabetes is now being followed at the [facility]  clinic at [facility].  Since we last saw her she had an ophthalmology evaluation on 07/27/14 by Dr. Dagoberto Ligas and no diabetic retinopathy was found.  Her last chest x-ray was on 12/21/12 elsewhere and showed a normal heart size and low lung volumes. She had a recent CT scan of the chest on 03/23/13 at [facility] which showed normal heart size and demonstrated atherosclerosis of the aorta.   The patient has a past history of dermatofibrosarcoma of the spine. She had surgery for this at [facility]. She also has a past history of angiolipoma of the abdominal wall.   Since last visit she has not been experiencing any new cardiac symptoms.  She does have sleep apnea.  She uses a CPAP machine.  She has had some low back pain and pain in her hip radiating to her knees.  She has seen Dr. Cleophas Dunker. She was admitted to the hospital on 01/18/15 for chest and abdominal pain.  She had a CTA of the chest on 01/18/15 which was negative for pulmonary embolus.  The following day she had a Myoview on 01/19/15 which showed no ischemia and her ejection fraction was 85%.  She had a lot of side effects from the [facility].  It was felt in retrospect that her presenting complaints were probably GI in origin rather than cardiac.  She does have a history of GERD and sees Dr. Randa Evens.  since last visit she has had no new cardiac symptoms.  She has not been having any recent  chest discomfort. Her weight is unchanged and her blood pressures been stable.  11/22/2015  Whitney Newton presents today to establish care with me. She is a previous patient of Dr. Ronny Flurry. She recently was at the beach and developed some worsening shortness of breath and leg swelling. Her nephew was there who came down from [place] with a head cold and she seems to have symptoms now at this time. She was also seen at an urgent care there and placed on amoxicillin. She's taken that for a few days. She notes significant swelling in her  weight is much higher than it had been in the past. She endorses a high salt intake. Her last echocardiogram was in 2013 which showed an EF of 55-65% with grade 1 diastolic dysfunction. She had a stress test in 2016 for chest pain which showed an EF of 85%.  12/25/2015  I saw Whitney Newton that today in the office. She has been taking the Lasix 40 mg daily which was over a week and this resulted in significant diuresis. Her weight had reduced from 202 down to 189. She then had to stop it for a few days because of some other medical problems are currently weight is up to 194. Her BNP associate with this was very low at 8 and renal function appeared to be stable. She reports a 75% improvement in breathing. Her repeat echo shows that EF remains stable with grade 1 diastolic dysfunction.  03/13/2016  Whitney Newton reports she's had a marked improvement in her swelling and breathing with additional diuretics. Weight has been fairly stable. She reports one episode of chest pain which was responsive to nitroglycerin. She's also had some reflux symptoms and is currently on Zegerid which appears to be helping her symptoms. Is not clear whether these episodes of chest discomfort are related to reflux or coronary ischemia. She did have a negative Myoview stress test in August 2016.  08/29/2016  Whitney Newton was seen today in the office. Over the past several day she's had worsening shortness of breath and lower extremity swelling. Her weight is now up about 6 pounds from her previous office visit. She said over the auscultation is been taking Lasix more regularly. She's currently taking 40 mg daily but as previously taken that for 3 days and alternating with 20 mg. She notes some improvement today after a couple days of diuretics.  10/08/2016  Whitney Newton returns today for follow-up. She has diuresed several pounds with increased dose diuretics are per creatinine rose and therefore I decreased her diuretic back to 40  mg daily. She says she feels like she is over dried out on that dose of diuretic. She occasionally gets some cramping. She's also had some labile blood sugars which she attributes to the diuretics. In addition she had a recent bowel obstruction which may been related to diuresis.  03/06/2017  Whitney Newton was seen today in follow-up. She recently got back from a trip to assess catch one with her husband. She said they were traveling for about 16 days and she did not take her Lasix during that period of time. Her weight is up about 6 pounds that she reports lower extremity swelling. She has a sliding scale Lasix to take but has not been compliant with that.  08/20/2017  Whitney Newton returns today for follow-up.  She has been having a lot of symptoms recently.  She is complaining of pain across her abdomen.  This is been further assessed and there  is a suggestion it could be related to back surgery.  She was also found to have mass on the liver and pancreas, thought to be hemangioma.  She is scheduled to have a CT scan of her abdomen next week.  She continues to gain weight.  Her weight is up 6 pounds since I saw her in September.  Which was 203 at the time.  Her weight had been as low as 195 last year.  She does report lower extremity edema and some worsening shortness of breath however she relates the shortness of breath more to her pain.  She had been on higher dose Lasix in the past but was noted to have some elevation in creatinine.  We did reduce the dose and she has been compliant taking 20 mg daily.  02/05/2018  Whitney Newton returns for follow-up today.  Again she has numerous complaints however she is recently been having worsening chest discomfort.  She describes it as a squeezing or pressure in the center of her chest sometimes radiates up her neck.  May be associated with worsening shortness of breath.  Despite this she is taken extra Lasix and her weight is come down to her baseline of 203.  She has not had symptomatic  improvement.  She was scheduled to see me in a few weeks but got an earlier appointment because of her chest discomfort.  Sometimes her symptoms are associated with exertion and relieved by rest at other times can be present at rest or when awakening.  02/23/2018  Whitney Newton was seen today in follow-up of heart cath.  She underwent left heart catheterization on 02/11/2018.  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  LVEF was 65% with normal LVEDP 18 mmHg.  Post cath she still reports shortness of breath with certain activities.  She did have recent lipid testing which was as follows: LDL 148, Total chol 225, HDL 54, Non-HDL 171, TG 178.  Her goal LDL is less than 70 and she has not yet reach that target.  Unfortunately she has been statin intolerant and is a good candidate for PCSK9 inhibitor.  05/22/2018  Whitney Newton is seen today in follow-up.  She was started on Repatha which she seems to be tolerating well.  She has been on the medicine now for about 3 months and is getting it from free with support from [employer].  Recently she had transcranial Dopplers at [facility] which actually shows some mild improvement in her intracranial atherosclerosis.  This could be related to her aggressive lipid therapy.  She was supposed to have repeat labs prior to this visit however it was not performed.  We will need to order a repeat lipid profile in order to reassess her therapy so far.  08/10/2018  Whitney Newton returns today for follow-up.  She has been doing well with Repatha although has had several injections which have possibly failed.  She seems to be injecting on the anterior thigh and has had some bleeding and bruising.  She also has trouble depressing the injector and noted some leakage of the medication.  I did perform some  extra training with her today and helped describe to her a better way to hold the pen as well as a better injection site which I think should improve her drug delivery.  That being said  in December, her total cholesterol come down significantly to 104, with HDL 62, LDL 26 and triglycerides of 81.  Hemoglobin A1c remains persistently elevated at 8.2.  04/16/2019  Whitney Newton is seen today in follow-up.  She thinks she might of had another stroke in May.  She had some word finding difficulties.  She is being followed by neurology at [facility] and had a recent transcranial Doppler and may have an MRI because she is been having some numbness and tingling in her arms.  She seems to be tolerating Repatha.  Her labs are markedly improved.  Total cholesterol now 104, triglycerides 81, HDL 62 and LDL 26.  No significant side effects with it.  10/22/2019  Whitney Newton returns today for follow-up with her husband.  She denies any new stroke symptoms.  She is responded well to Repatha and although her LDL was down as low as 14 it is now come up to 49 about 6 months ago.  Again this is well controlled however the increase is due to dietary changes.  She says she is eating more pizza, bacon and eggs and other saturated fats.  In addition she has gained weight.  There is also lower extremity edema.  She reports lack of compliance with the Lasix.  She says she may go several days without taking any and then has to take several days worth.  She has had issues with incontinence and a spastic bladder.  04/19/2020  Whitney Newton returns today with her husband.  Overall she reports some worsening shortness of breath.  She has had lower extremity edema and weight gain despite taking her Lasix more regularly.  She takes 40 mg daily.  Weight is up again another few pounds.  She is not had a repeat echo since 2017 which showed normal LVEF at the time but diastolic dysfunction.  01/24/2021  Whitney Newton returns today in follow-up.  She has a number of different  complaints today.  She is really struggling with fatigue and some daytime somnolence.  She says she has trouble sleeping at night.  I think she is struggling with neuropathy.  She has lower extremity edema but recently we had increased her diuretics.  Her BNP was low and I recommended instead of 40 twice daily to go to 60 mg Lasix daily.  Potassium and electrolytes are normal however she has had leg cramps and restlessness mostly at night.  06/08/2021  Whitney Newton is seen today in follow-up.  She was just hospitalized at [facility] for syncopal episode.  This occurred in her home.  She apparently became somewhat unresponsive and her husband called 911.  She then syncopized and was awakened by EMS.  She was taken to [facility].  Initial work-up was unremarkable.  She apparently had an elevated D-dimer but testing was negative for PE.  Repeat echo showed normal LVEF 60 to 65%.  She was noted to be dehydrated and taken off of her Ziac.  She had previously discontinued Lasix which I placed her on before.  She says that her swelling is actually been a little better and she is urinating less.  They increased her losartan from 25 to 50 mg daily and noted she was B12 deficient and had given her some B12 injections.  She says her energy level has improved somewhat.  EKG here shows normal sinus rhythm at 94.  She was placed on a monitor which she recently returned but those results were not available and would be interpreted by the staff at [facility].  08/10/2021  Whitney Newton returns today for follow-up of her monitor and worsening edema.  Blood pressure is also been poorly controlled recently.  She is been taken off of Ziac.  She was on metoprolol which I started but she said she could not tolerate it due to joint pains.  She also has had worsening lower extremity edema and weight gain up another 5 pounds since I saw her in December.  Overall she does not feel well.  Unfortunately she has numerous medication intolerances and it has been  difficult to find a regimen that she could tolerate.  02/14/2022  Whitney Newton is seen today in follow-up.  She was recently seen by Juanda Crumble, PA-C and noted to be volume overloaded.  She was not tolerant of furosemide however she was trialed on torsemide.  This seemed to work a little better for her and she had actually lost quite a bit of weight down to about 203 pounds however her weight now is back up to 212 pounds.  She also has regained a lot of fluid.  It is likely because she has not been taking the torsemide which she thought she may only have to take as needed.  For some reason her prescription torsemide was only written for 1 dose.  She did have repeat labs which showed essentially no change in renal function.  08/26/2022  Whitney Newton returns today for follow-up.  Unfortunately her husband passed away at the end of last year.  Her family does seem to be quite supportive of her and she says she is starting to get more involved with church.  She did lose a significant amount of weight, about 20 pounds since September.  Part of that I think was fluid as she has been on torsemide but she says at times now she is not able to take it on a daily basis because she gets significant cramping.  She is still struggling from some swelling in her legs.  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 She has tried to wear stockings but has been unable to get them on or tolerate them. Patient has attempted exercise, elevation, and compression for four weeks without success. Her BNP recently was negative.  She does have difficulty walking and is requesting a handicap parking sticker.   10/29/2022  Whitney Newton is seen today for an acute visit.  I just saw her back in March.  We have been working to try to get her lymphedema pumps.  Apparently she was measured and fitted for those but they have not yet arrived.  She reports that she called in to be seen today because she has been having some palpitations and tachycardia.  Her Apple Watch  has been noting that she has been having fast heart rates.  There is concern for possible atrial fibrillation however her watch does not give that information.  EKG today shows normal sinus rhythm at 83.  04/18/2023  Whitney Newton returns today for follow-up.  She underwent lower extremity venous Dopplers which were to assess for reflux.  There was no significant treatable reflux found.  There is concern for possible lymphedema.  She was placed with lymphedema pumps which she is using.  She started using an over-the-counter supplement which she says has helped with her edema and stopped her torsemide.  Other than that she feels like she is doing fairly well.  She did have repeat lipids in August on Repatha with total cholesterol 102, HDL 52, triglycerides 100 and LDL 30.  Past Medical History:  Diagnosis Date  Acute diastolic (congestive) heart failure (HCC) 12/25/2015   Back pain    prior back surgery in 2009 - slow to recover   Cancer [facility])    Cerebral infarct (HCC) 05/12/2013   Diabetes (HCC)    Diverticulitis    Gastritis    Hemangioma of liver    managed conservatively; followed at [facility]   HLD (hyperlipidemia)    statin intolerant   Hypertension    Normal cardiac stress test 06/10/2009   Obesity    Thyroid disease    hypothyroidism    Past Surgical History:  Procedure Laterality Date   ABDOMINAL HYSTERECTOMY     APPENDECTOMY     LEFT HEART CATH AND CORONARY ANGIOGRAPHY N/A 02/11/2018   Procedure: LEFT HEART CATH AND CORONARY ANGIOGRAPHY;  Surgeon: Lennette Bihari, MD;  Location: [facility] INVASIVE CV LAB;  Service: Cardiovascular;  Laterality: N/A;   NASAL RECONSTRUCTION       Current Outpatient Medications  Medication Sig Dispense Refill   aspirin 81 MG tablet Take 81 mg by mouth daily.     clopidogrel (PLAVIX) 75 MG tablet Take 1 tablet (75 mg total) by mouth daily. 90 tablet 3   colesevelam (WELCHOL) 625 MG tablet Take 3 tablets (1,875 mg total) by mouth 2 (two) times daily as needed  (cholesterol). 180 tablet 6   Cyanocobalamin (VITAMIN B-12) 5000 MCG TBDP Take 5,000 mcg by mouth daily.     diltiazem (CARDIZEM CD) 120 MG 24 hr capsule Take 1 capsule (120 mg total) by mouth daily. 30 capsule 5   Evolocumab 140 MG/ML SOAJ Inject 140 mg into the skin every 14 (fourteen) days. 6 mL 3   famotidine (PEPCID) 20 MG tablet Take 20 mg by mouth daily.     gabapentin (NEURONTIN) 100 MG capsule Take 3 capsules (300mg ) by mouth at night and take additional 1-3 capsules daily as needed for breakthrough. 180 capsule 5   Glucagon 0.5 MG/0.1ML SOAJ      Homeopathic Products (LYMPHOMYOSOT LYMPH SUPPORT PO) Take 2 capsules by mouth daily.     HUMALOG KWIKPEN 100 UNIT/ML KiwkPen Inject 14 Units into the skin See admin instructions. Inject 11 units SQ at breakfast, inject 14 units SQ at lunch and inject 16 units SQ at supper. Plus sliding scale 1 unit for every 25 BS is > 120     Hyoscyamine Sulfate 0.375 MG TBCR Take 1 tablet every 12 hours by oral route.     levothyroxine (SYNTHROID, LEVOTHROID) 88 MCG tablet Take 88 mcg by mouth daily.      Liniments (SALONPAS EX) Apply 1 patch topically daily as needed (for pain).     losartan (COZAAR) 25 MG tablet Take 1 tablet (25 mg total) by mouth daily. 90 tablet 3   nitroGLYCERIN (NITROSTAT) 0.4 MG SL tablet Place 1 tablet (0.4 mg total) under the tongue every 5 (five) minutes as needed for chest pain. 25 tablet 2   ondansetron (ZOFRAN) 8 MG tablet Take 8 mg by mouth once.     Potassium Chloride ER 20 MEQ TBCR Take 20 mEq by mouth daily.     potassium chloride SA (KLOR-CON M) 20 MEQ tablet Take 1 tablet (20 mEq total) by mouth daily in the afternoon. 90 tablet 3   torsemide (DEMADEX) 20 MG tablet Take 10 mg by mouth daily as needed.     TOUJEO SOLOSTAR 300 UNIT/ML SOPN Inject 16 Units into the skin daily.     famotidine (PEPCID) 10 MG tablet Take 10 mg by  mouth daily in the afternoon. (Patient not taking: Reported on 04/18/2023)     ondansetron (ZOFRAN  ODT) 4 MG disintegrating tablet Take 1 tablet (4 mg total) by mouth every 8 (eight) hours as needed for nausea or vomiting. (Patient not taking: Reported on 04/18/2023) 20 tablet 0   ondansetron (ZOFRAN-ODT) 8 MG disintegrating tablet Take 8 mg by mouth once.     No current facility-administered medications for this visit.    Allergies:   Celecoxib, Lipitor [atorvastatin calcium], Zetia [ezetimibe], Ropinirole hcl, Actos [pioglitazone hydrochloride], Crestor [rosuvastatin calcium], Demeclocycline, Doxycycline, Duloxetine, Fenofibrate, Imdur [isosorbide nitrate], Metformin and related, Metoprolol succinate [metoprolol], Pravachol [pravastatin], Ranolazine er, Rosuvastatin, and Niaspan [niacin]    Social History:  The patient  reports that she has never smoked. She has never used smokeless tobacco. She reports that she does not drink alcohol and does not use drugs.   Family History:  The patient's family history includes Dementia in her father; Heart attack in her brother and father; Heart disease in her brother and father; Hypertension in her mother; Stroke in her mother.    ROS:   Pertinent items noted in HPI and remainder of comprehensive ROS otherwise negative.  PHYSICAL EXAM: VS:  BP (!) 146/62 (BP Location: Left Arm, Patient Position: Sitting, Cuff Size: Normal)   Pulse 62   Ht 5\' 3"  (1.6 m)   Wt 191 lb (86.6 kg)   SpO2 98%   BMI 33.83 kg/m  , BMI Body mass index is 33.83 kg/m. General appearance: alert, no distress and moderately obese Neck: no carotid bruit and no JVD Lungs: diminished breath sounds bilaterally Heart: regular rate and rhythm Abdomen: soft, non-tender; bowel sounds normal; no masses,  no organomegaly Extremities: edema 2+ pedal edema, notable bilateral LE hyperpigmentation, small varicosities and spider veins are noted, the edema is mildly pitting Pulses: 2+ and symmetric Skin: There is lower extremity hyperpigmentation/hemosiderin deposition Neurologic: Grossly  normal Psych: Mildly anxious  EKG:   EKG Interpretation Date/Time:  Friday April 18 2023 16:20:37 EST Ventricular Rate:  58 PR Interval:  178 QRS Duration:  104 QT Interval:  418 QTC Calculation: 410 R Axis:   -26  Text Interpretation: Sinus bradycardia Inferior-posterior infarct , age undetermined No significant change since last tracing Confirmed by Whitney Newton [phone]) on 04/18/2023 4:28:17 PM    Recent Labs: 01/29/2023: ALT 15; BUN 13; Creatinine, Ser 0.77; Hemoglobin 10.8; Magnesium 1.7; Platelets 247; Potassium 3.5; Sodium 143    Lipid Panel    Component Value Date/Time   CHOL 102 01/29/2023 0934   CHOL 129 01/10/2022 0835   TRIG 100 01/29/2023 0934   HDL 52 01/29/2023 0934   HDL 72 01/10/2022 0835   CHOLHDL 2.0 01/29/2023 0934   VLDL 20 01/29/2023 0934   LDLCALC 30 01/29/2023 0934   LDLCALC 39 01/10/2022 0835   LDLDIRECT 148.7 09/14/2012 0857      Wt Readings from Last 3 Encounters:  04/18/23 191 lb (86.6 kg)  01/28/23 201 lb 8 oz (91.4 kg)  11/15/22 189 lb 11.2 oz (86 kg)     ASSESSMENT AND PLAN:  Syncope, possibly vasovagal Recent tachycardia on her Apple Watch Chronic diastolic congestive heart failure Hypertensive heart disease without heart failure Diabetes mellitus - on insulin Atypical chest pain with normal Myoview stress test in August 2016.  No ischemia.  Ejection fraction 85%. old CVA followed at [facility] History of dermatofibro- sarcoma of the spine. Dyslipidemia Osteoarthritis OSA on CPAP [facility]) Hypothyroidism followed at [facility]. GERD, followed  by Dr. Randa Evens  RBBB Peripheral neuropathy Statin intolerance - on Repatha Lymphedema   PLAN:  Mrs. Frigon notes improvement in her edema after starting to take a supplement over-the-counter for lymphedema.  She has come off of her diuretics.  She will need to monitor this closely because she does have a history of some chronic diastolic heart failure.  Weight is up  about 2 pounds since I saw her in June.  Otherwise no further syncopal episodes.  She was noted to have episodes of SVT on her monitor and I had placed her on beta-blocker.  This seems to have stopped those episodes.  Plan follow-up with me in 6 months or sooner if necessary.  Chrystie Nose, MD, [facility], FACP  [place]  [facility]  Medical Director of the [facility] &  Cardiovascular Risk Reduction Clinic Diplomate of the American Board of Clinical Lipidology Attending Cardiologist  Direct Dial: [phone]  Fax: [phone]  Website:  [email or web address]   04/18/2023 4:28 PM

## 2023-04-25 DIAGNOSIS — Z23 Encounter for immunization: Secondary | ICD-10-CM | POA: Diagnosis not present

## 2023-05-09 ENCOUNTER — Other Ambulatory Visit: Payer: Self-pay

## 2023-05-09 ENCOUNTER — Other Ambulatory Visit (HOSPITAL_COMMUNITY): Payer: Self-pay

## 2023-06-13 ENCOUNTER — Other Ambulatory Visit: Payer: Self-pay | Admitting: Internal Medicine

## 2023-06-19 DIAGNOSIS — E7800 Pure hypercholesterolemia, unspecified: Secondary | ICD-10-CM | POA: Diagnosis not present

## 2023-06-19 DIAGNOSIS — I5032 Chronic diastolic (congestive) heart failure: Secondary | ICD-10-CM | POA: Diagnosis not present

## 2023-06-19 DIAGNOSIS — I679 Cerebrovascular disease, unspecified: Secondary | ICD-10-CM | POA: Diagnosis not present

## 2023-06-19 DIAGNOSIS — M25552 Pain in left hip: Secondary | ICD-10-CM | POA: Diagnosis not present

## 2023-06-19 DIAGNOSIS — E119 Type 2 diabetes mellitus without complications: Secondary | ICD-10-CM | POA: Diagnosis not present

## 2023-06-19 DIAGNOSIS — E039 Hypothyroidism, unspecified: Secondary | ICD-10-CM | POA: Diagnosis not present

## 2023-06-19 DIAGNOSIS — K869 Disease of pancreas, unspecified: Secondary | ICD-10-CM | POA: Diagnosis not present

## 2023-06-19 DIAGNOSIS — C4499 Other specified malignant neoplasm of skin, unspecified: Secondary | ICD-10-CM | POA: Diagnosis not present

## 2023-06-19 DIAGNOSIS — D1803 Hemangioma of intra-abdominal structures: Secondary | ICD-10-CM | POA: Diagnosis not present

## 2023-06-19 DIAGNOSIS — R748 Abnormal levels of other serum enzymes: Secondary | ICD-10-CM | POA: Diagnosis not present

## 2023-06-19 DIAGNOSIS — I10 Essential (primary) hypertension: Secondary | ICD-10-CM | POA: Diagnosis not present

## 2023-06-19 DIAGNOSIS — G4733 Obstructive sleep apnea (adult) (pediatric): Secondary | ICD-10-CM | POA: Diagnosis not present

## 2023-06-19 DIAGNOSIS — K219 Gastro-esophageal reflux disease without esophagitis: Secondary | ICD-10-CM | POA: Diagnosis not present

## 2023-06-20 ENCOUNTER — Ambulatory Visit
Admission: RE | Admit: 2023-06-20 | Discharge: 2023-06-20 | Disposition: A | Discharge status: Home / Self Care | Payer: Medicare Other | Source: Ambulatory Visit | Attending: Family Medicine | Admitting: Family Medicine

## 2023-06-20 ENCOUNTER — Other Ambulatory Visit: Payer: Self-pay | Admitting: Family Medicine

## 2023-06-20 DIAGNOSIS — M25552 Pain in left hip: Secondary | ICD-10-CM

## 2023-06-20 DIAGNOSIS — M1612 Unilateral primary osteoarthritis, left hip: Secondary | ICD-10-CM | POA: Diagnosis not present

## 2023-07-03 ENCOUNTER — Ambulatory Visit: Payer: Medicare Other | Admitting: Podiatry

## 2023-07-07 ENCOUNTER — Ambulatory Visit (INDEPENDENT_AMBULATORY_CARE_PROVIDER_SITE_OTHER): Payer: Medicare Other | Admitting: Podiatry

## 2023-07-07 DIAGNOSIS — M79674 Pain in right toe(s): Secondary | ICD-10-CM

## 2023-07-07 DIAGNOSIS — M79675 Pain in left toe(s): Secondary | ICD-10-CM | POA: Diagnosis not present

## 2023-07-07 DIAGNOSIS — B351 Tinea unguium: Secondary | ICD-10-CM

## 2023-07-07 NOTE — Progress Notes (Signed)
Subjective:  Patient ID: Whitney Newton, female    DOB: [birth year],  MRN: [id number]   Whitney Newton presents to clinic today for:  Chief Complaint  Patient presents with   Diabetes    DFC A1C - 6.3 Blood Thinner   Patient notes nails are thick, discolored, elongated and painful in shoegear when trying to ambulate.    PCP is Irven Coe, MD.  Past Medical History:  Diagnosis Date   Acute diastolic (congestive) heart failure (HCC) 12/25/2015   Back pain    prior back surgery in 2009 - slow to recover   Cancer [facility])    Cerebral infarct (HCC) 05/12/2013   Diabetes (HCC)    Diverticulitis    Gastritis    Hemangioma of liver    managed conservatively; followed at [facility]   HLD (hyperlipidemia)    statin intolerant   Hypertension    Normal cardiac stress test 06/10/2009   Obesity    Thyroid disease    hypothyroidism    Past Surgical History:  Procedure Laterality Date   ABDOMINAL HYSTERECTOMY     APPENDECTOMY     LEFT HEART CATH AND CORONARY ANGIOGRAPHY N/A 02/11/2018   Procedure: LEFT HEART CATH AND CORONARY ANGIOGRAPHY;  Surgeon: Lennette Bihari, MD;  [facility];  Service: Cardiovascular;  Laterality: N/A;   NASAL RECONSTRUCTION      Allergies  Allergen Reactions   Celecoxib Shortness Of Breath   Lipitor [Atorvastatin Calcium] Shortness Of Breath and Other (See Comments)    CHEST PAIN   Zetia [Ezetimibe] Shortness Of Breath and Other (See Comments)    NO SLEEP   Ropinirole Hcl Other (See Comments)    Restless legs syndrome.   Actos [Pioglitazone Hydrochloride] Swelling   Crestor [Rosuvastatin Calcium] Other (See Comments)    LEG PAIN   Demeclocycline     gastritis   Doxycycline Other (See Comments)    gastritis   Duloxetine Nausea Only   Fenofibrate     Unknown reaction   Imdur [Isosorbide Nitrate] Diarrhea    Headache, stiff neck   Metformin And Related Swelling    Weight gain   Metoprolol Succinate [Metoprolol] Other (See  Comments)    Joint pain   Pravachol [Pravastatin]     Joint pain   Ranolazine Er Other (See Comments)    EDEMA   Rosuvastatin Other (See Comments)    LEG PAIN   Niaspan [Niacin] Rash    Flushing     Review of Systems: Negative except as noted in the HPI.  Objective:  Whitney Newton is a pleasant 83 y.o. female in NAD. AAO x 3.  Vascular Examination: Capillary refill time is 3-5 seconds to toes bilateral. Palpable pedal pulses b/l LE. Digital hair present b/l.  Skin temperature gradient WNL b/l. No varicosities b/l. No cyanosis noted b/l.   Dermatological Examination: Pedal skin with normal turgor, texture and tone b/l. No open wounds. No interdigital macerations b/l. Toenails x10 are 3mm thick, discolored, dystrophic with subungual debris. There is pain with compression of the nail plates.  They are elongated x10     Latest Ref Rng & Units 01/29/2023    9:33 AM  Hemoglobin A1C  Hemoglobin-A1c 4.8 - 5.6 % 6.7    Assessment/Plan: 1. Pain due to onychomycosis of toenails of both feet     The mycotic toenails were sharply debrided x10 with sterile nail nippers and a power debriding burr to decrease bulk/thickness and  length.    Return in about 3 months (around 10/05/2023) for [facility]   Clerance Lav, DPM, FACFAS [facility]     [address] [place], [place] [address]                Office [phone]  Fax [phone]

## 2023-08-06 ENCOUNTER — Other Ambulatory Visit (HOSPITAL_COMMUNITY): Payer: Self-pay

## 2023-08-07 DIAGNOSIS — R739 Hyperglycemia, unspecified: Secondary | ICD-10-CM | POA: Diagnosis not present

## 2023-08-07 DIAGNOSIS — R079 Chest pain, unspecified: Secondary | ICD-10-CM | POA: Diagnosis not present

## 2023-08-07 DIAGNOSIS — R42 Dizziness and giddiness: Secondary | ICD-10-CM | POA: Diagnosis not present

## 2023-08-07 DIAGNOSIS — I10 Essential (primary) hypertension: Secondary | ICD-10-CM | POA: Diagnosis not present

## 2023-08-07 DIAGNOSIS — R112 Nausea with vomiting, unspecified: Secondary | ICD-10-CM | POA: Diagnosis not present

## 2023-08-14 ENCOUNTER — Other Ambulatory Visit: Payer: Self-pay | Admitting: Gastroenterology

## 2023-08-14 DIAGNOSIS — R748 Abnormal levels of other serum enzymes: Secondary | ICD-10-CM

## 2023-08-14 DIAGNOSIS — R945 Abnormal results of liver function studies: Secondary | ICD-10-CM | POA: Diagnosis not present

## 2023-08-14 DIAGNOSIS — R110 Nausea: Secondary | ICD-10-CM

## 2023-08-14 DIAGNOSIS — K8689 Other specified diseases of pancreas: Secondary | ICD-10-CM

## 2023-08-14 DIAGNOSIS — D1803 Hemangioma of intra-abdominal structures: Secondary | ICD-10-CM | POA: Diagnosis not present

## 2023-08-14 DIAGNOSIS — R194 Change in bowel habit: Secondary | ICD-10-CM | POA: Diagnosis not present

## 2023-08-14 DIAGNOSIS — K869 Disease of pancreas, unspecified: Secondary | ICD-10-CM | POA: Diagnosis not present

## 2023-08-19 ENCOUNTER — Other Ambulatory Visit: Payer: Self-pay | Admitting: Internal Medicine

## 2023-08-21 DIAGNOSIS — E1169 Type 2 diabetes mellitus with other specified complication: Secondary | ICD-10-CM | POA: Diagnosis not present

## 2023-08-21 DIAGNOSIS — I5032 Chronic diastolic (congestive) heart failure: Secondary | ICD-10-CM | POA: Diagnosis not present

## 2023-08-21 DIAGNOSIS — I10 Essential (primary) hypertension: Secondary | ICD-10-CM | POA: Diagnosis not present

## 2023-08-21 DIAGNOSIS — E119 Type 2 diabetes mellitus without complications: Secondary | ICD-10-CM | POA: Diagnosis not present

## 2023-08-26 ENCOUNTER — Ambulatory Visit (HOSPITAL_COMMUNITY): Admission: RE | Admit: 2023-08-26 | Source: Ambulatory Visit

## 2023-08-26 ENCOUNTER — Other Ambulatory Visit: Payer: Self-pay | Admitting: Gastroenterology

## 2023-08-26 ENCOUNTER — Ambulatory Visit (HOSPITAL_COMMUNITY)
Admission: RE | Admit: 2023-08-26 | Discharge: 2023-08-26 | Disposition: A | Discharge status: Home / Self Care | Source: Ambulatory Visit | Attending: Gastroenterology | Admitting: Gastroenterology

## 2023-08-26 DIAGNOSIS — R748 Abnormal levels of other serum enzymes: Secondary | ICD-10-CM | POA: Insufficient documentation

## 2023-08-26 DIAGNOSIS — E278 Other specified disorders of adrenal gland: Secondary | ICD-10-CM | POA: Diagnosis not present

## 2023-08-26 DIAGNOSIS — D1803 Hemangioma of intra-abdominal structures: Secondary | ICD-10-CM | POA: Insufficient documentation

## 2023-08-26 DIAGNOSIS — R110 Nausea: Secondary | ICD-10-CM | POA: Diagnosis not present

## 2023-08-26 DIAGNOSIS — K8689 Other specified diseases of pancreas: Secondary | ICD-10-CM

## 2023-08-26 DIAGNOSIS — K838 Other specified diseases of biliary tract: Secondary | ICD-10-CM | POA: Diagnosis not present

## 2023-08-26 DIAGNOSIS — K449 Diaphragmatic hernia without obstruction or gangrene: Secondary | ICD-10-CM | POA: Diagnosis not present

## 2023-08-26 MED ORDER — GADOBUTROL 1 MMOL/ML IV SOLN
8.0000 mL | Freq: Once | INTRAVENOUS | Status: AC | PRN
  Administered 2023-08-26: 8 mL via INTRAVENOUS

## 2023-09-01 ENCOUNTER — Other Ambulatory Visit

## 2023-09-08 DIAGNOSIS — E119 Type 2 diabetes mellitus without complications: Secondary | ICD-10-CM | POA: Diagnosis not present

## 2023-09-08 DIAGNOSIS — I10 Essential (primary) hypertension: Secondary | ICD-10-CM | POA: Diagnosis not present

## 2023-09-08 DIAGNOSIS — E1169 Type 2 diabetes mellitus with other specified complication: Secondary | ICD-10-CM | POA: Diagnosis not present

## 2023-09-08 DIAGNOSIS — E7800 Pure hypercholesterolemia, unspecified: Secondary | ICD-10-CM | POA: Diagnosis not present

## 2023-09-16 DIAGNOSIS — E1165 Type 2 diabetes mellitus with hyperglycemia: Secondary | ICD-10-CM | POA: Diagnosis not present

## 2023-09-16 DIAGNOSIS — E039 Hypothyroidism, unspecified: Secondary | ICD-10-CM | POA: Diagnosis not present

## 2023-09-16 DIAGNOSIS — Z794 Long term (current) use of insulin: Secondary | ICD-10-CM | POA: Diagnosis not present

## 2023-09-17 DIAGNOSIS — K219 Gastro-esophageal reflux disease without esophagitis: Secondary | ICD-10-CM | POA: Diagnosis not present

## 2023-09-17 DIAGNOSIS — E7800 Pure hypercholesterolemia, unspecified: Secondary | ICD-10-CM | POA: Diagnosis not present

## 2023-09-17 DIAGNOSIS — E039 Hypothyroidism, unspecified: Secondary | ICD-10-CM | POA: Diagnosis not present

## 2023-09-17 DIAGNOSIS — I10 Essential (primary) hypertension: Secondary | ICD-10-CM | POA: Diagnosis not present

## 2023-09-17 DIAGNOSIS — I679 Cerebrovascular disease, unspecified: Secondary | ICD-10-CM | POA: Diagnosis not present

## 2023-09-17 DIAGNOSIS — I471 Supraventricular tachycardia, unspecified: Secondary | ICD-10-CM | POA: Diagnosis not present

## 2023-09-17 DIAGNOSIS — W57XXXA Bitten or stung by nonvenomous insect and other nonvenomous arthropods, initial encounter: Secondary | ICD-10-CM | POA: Diagnosis not present

## 2023-09-17 DIAGNOSIS — E119 Type 2 diabetes mellitus without complications: Secondary | ICD-10-CM | POA: Diagnosis not present

## 2023-09-17 DIAGNOSIS — Z0000 Encounter for general adult medical examination without abnormal findings: Secondary | ICD-10-CM | POA: Diagnosis not present

## 2023-09-17 DIAGNOSIS — E538 Deficiency of other specified B group vitamins: Secondary | ICD-10-CM | POA: Diagnosis not present

## 2023-09-17 DIAGNOSIS — R350 Frequency of micturition: Secondary | ICD-10-CM | POA: Diagnosis not present

## 2023-09-23 DIAGNOSIS — E119 Type 2 diabetes mellitus without complications: Secondary | ICD-10-CM | POA: Diagnosis not present

## 2023-09-23 DIAGNOSIS — I5032 Chronic diastolic (congestive) heart failure: Secondary | ICD-10-CM | POA: Diagnosis not present

## 2023-09-23 DIAGNOSIS — E1169 Type 2 diabetes mellitus with other specified complication: Secondary | ICD-10-CM | POA: Diagnosis not present

## 2023-09-23 DIAGNOSIS — I10 Essential (primary) hypertension: Secondary | ICD-10-CM | POA: Diagnosis not present

## 2023-09-29 ENCOUNTER — Ambulatory Visit: Payer: Medicare Other | Admitting: Podiatry

## 2023-10-06 ENCOUNTER — Ambulatory Visit (INDEPENDENT_AMBULATORY_CARE_PROVIDER_SITE_OTHER): Admitting: Podiatry

## 2023-10-06 DIAGNOSIS — M79675 Pain in left toe(s): Secondary | ICD-10-CM

## 2023-10-06 DIAGNOSIS — B351 Tinea unguium: Secondary | ICD-10-CM | POA: Diagnosis not present

## 2023-10-06 DIAGNOSIS — M79674 Pain in right toe(s): Secondary | ICD-10-CM | POA: Diagnosis not present

## 2023-10-06 NOTE — Progress Notes (Signed)
 Subjective:  Patient ID: Whitney Newton, female    DOB: [birth year],  MRN: [id number]   Whitney Newton presents to clinic today for:  Chief Complaint  Patient presents with   Nail Problem    Nail trim    Patient notes nails are thick, discolored, elongated and painful in shoegear when trying to ambulate.    PCP is Benedetto Brady, MD.  Past Medical History:  Diagnosis Date   Acute diastolic (congestive) heart failure (HCC) 12/25/2015   Back pain    prior back surgery in 2009 - slow to recover   Cancer [facility])    Cerebral infarct (HCC) 05/12/2013   Diabetes (HCC)    Diverticulitis    Gastritis    Hemangioma of liver    managed conservatively; followed at [facility]   HLD (hyperlipidemia)    statin intolerant   Hypertension    Normal cardiac stress test 06/10/2009   Obesity    Thyroid  disease    hypothyroidism    Past Surgical History:  Procedure Laterality Date   ABDOMINAL HYSTERECTOMY     APPENDECTOMY     LEFT HEART CATH AND CORONARY ANGIOGRAPHY N/A 02/11/2018   Procedure: LEFT HEART CATH AND CORONARY ANGIOGRAPHY;  Surgeon: Millicent Ally, MD;  [facility];  Service: Cardiovascular;  Laterality: N/A;   NASAL RECONSTRUCTION      Allergies  Allergen Reactions   Celecoxib Shortness Of Breath   Lipitor [Atorvastatin Calcium] Shortness Of Breath and Other (See Comments)    CHEST PAIN   Zetia [Ezetimibe] Shortness Of Breath and Other (See Comments)    NO SLEEP   Ropinirole Hcl Other (See Comments)    Restless legs syndrome.   Actos [Pioglitazone Hydrochloride] Swelling   Crestor [Rosuvastatin Calcium] Other (See Comments)    LEG PAIN   Demeclocycline     gastritis   Doxycycline Other (See Comments)    gastritis   Duloxetine Nausea Only   Fenofibrate     Unknown reaction   Imdur  [Isosorbide  Nitrate] Diarrhea    Headache, stiff neck   Metformin And Related Swelling    Weight gain   Metoprolol  Succinate [Metoprolol ] Other (See Comments)    Joint  pain   Pravachol [Pravastatin]     Joint pain   Ranolazine Er Other (See Comments)    EDEMA   Rosuvastatin Other (See Comments)    LEG PAIN   Niaspan [Niacin] Rash    Flushing     Review of Systems: Negative except as noted in the HPI.  Objective:  Whitney Newton is a pleasant 83 y.o. female in NAD. AAO x 3.  Vascular Examination: Capillary refill time is 3-5 seconds to toes bilateral. Palpable pedal pulses b/l LE. Digital hair present b/l.  Skin temperature gradient WNL b/l. No varicosities b/l. No cyanosis noted b/l.   Dermatological Examination: Pedal skin with normal turgor, texture and tone b/l. No open wounds. No interdigital macerations b/l. Toenails x10 are 3mm thick, discolored, dystrophic with subungual debris. There is pain with compression of the nail plates.  They are elongated x10     Latest Ref Rng & Units 01/29/2023    9:33 AM  Hemoglobin A1C  Hemoglobin-A1c 4.8 - 5.6 % 6.7    Assessment/Plan: 1. Pain due to onychomycosis of toenails of both feet     The mycotic toenails were sharply debrided x10 with sterile nail nippers and a power debriding burr to decrease bulk/thickness and length.  Return in about 9 weeks (around 12/08/2023) for [facility].   Joe Murders, DPM, FACFAS [facility]     [address] [place], [place] [address]                Office [phone]  Fax [phone]

## 2023-10-08 DIAGNOSIS — I5032 Chronic diastolic (congestive) heart failure: Secondary | ICD-10-CM | POA: Diagnosis not present

## 2023-10-08 DIAGNOSIS — I10 Essential (primary) hypertension: Secondary | ICD-10-CM | POA: Diagnosis not present

## 2023-10-08 DIAGNOSIS — E1169 Type 2 diabetes mellitus with other specified complication: Secondary | ICD-10-CM | POA: Diagnosis not present

## 2023-10-08 DIAGNOSIS — E119 Type 2 diabetes mellitus without complications: Secondary | ICD-10-CM | POA: Diagnosis not present

## 2023-10-08 DIAGNOSIS — E7800 Pure hypercholesterolemia, unspecified: Secondary | ICD-10-CM | POA: Diagnosis not present

## 2023-10-14 DIAGNOSIS — D1803 Hemangioma of intra-abdominal structures: Secondary | ICD-10-CM | POA: Diagnosis not present

## 2023-10-14 DIAGNOSIS — D3A8 Other benign neuroendocrine tumors: Secondary | ICD-10-CM | POA: Diagnosis not present

## 2023-10-26 DIAGNOSIS — I5032 Chronic diastolic (congestive) heart failure: Secondary | ICD-10-CM | POA: Diagnosis not present

## 2023-10-26 DIAGNOSIS — E119 Type 2 diabetes mellitus without complications: Secondary | ICD-10-CM | POA: Diagnosis not present

## 2023-10-26 DIAGNOSIS — I10 Essential (primary) hypertension: Secondary | ICD-10-CM | POA: Diagnosis not present

## 2023-10-26 DIAGNOSIS — E1169 Type 2 diabetes mellitus with other specified complication: Secondary | ICD-10-CM | POA: Diagnosis not present

## 2023-11-07 DIAGNOSIS — R110 Nausea: Secondary | ICD-10-CM | POA: Diagnosis not present

## 2023-11-07 DIAGNOSIS — E7800 Pure hypercholesterolemia, unspecified: Secondary | ICD-10-CM | POA: Diagnosis not present

## 2023-11-08 DIAGNOSIS — E1169 Type 2 diabetes mellitus with other specified complication: Secondary | ICD-10-CM | POA: Diagnosis not present

## 2023-11-08 DIAGNOSIS — E119 Type 2 diabetes mellitus without complications: Secondary | ICD-10-CM | POA: Diagnosis not present

## 2023-11-08 DIAGNOSIS — I10 Essential (primary) hypertension: Secondary | ICD-10-CM | POA: Diagnosis not present

## 2023-11-08 DIAGNOSIS — E7800 Pure hypercholesterolemia, unspecified: Secondary | ICD-10-CM | POA: Diagnosis not present

## 2023-11-08 DIAGNOSIS — I5032 Chronic diastolic (congestive) heart failure: Secondary | ICD-10-CM | POA: Diagnosis not present

## 2023-11-15 ENCOUNTER — Other Ambulatory Visit: Payer: Self-pay | Admitting: Internal Medicine

## 2023-11-15 ENCOUNTER — Other Ambulatory Visit (HOSPITAL_COMMUNITY): Payer: Self-pay

## 2023-11-15 DIAGNOSIS — I700 Atherosclerosis of aorta: Secondary | ICD-10-CM

## 2023-11-15 DIAGNOSIS — E782 Mixed hyperlipidemia: Secondary | ICD-10-CM

## 2023-11-15 DIAGNOSIS — E785 Hyperlipidemia, unspecified: Secondary | ICD-10-CM

## 2023-11-15 DIAGNOSIS — I2510 Atherosclerotic heart disease of native coronary artery without angina pectoris: Secondary | ICD-10-CM

## 2023-11-17 ENCOUNTER — Telehealth: Payer: Self-pay | Admitting: Internal Medicine

## 2023-11-17 ENCOUNTER — Other Ambulatory Visit (HOSPITAL_COMMUNITY): Payer: Self-pay

## 2023-11-17 ENCOUNTER — Other Ambulatory Visit: Payer: Self-pay

## 2023-11-17 DIAGNOSIS — E782 Mixed hyperlipidemia: Secondary | ICD-10-CM

## 2023-11-17 DIAGNOSIS — E785 Hyperlipidemia, unspecified: Secondary | ICD-10-CM

## 2023-11-17 DIAGNOSIS — I2510 Atherosclerotic heart disease of native coronary artery without angina pectoris: Secondary | ICD-10-CM

## 2023-11-17 DIAGNOSIS — I700 Atherosclerosis of aorta: Secondary | ICD-10-CM

## 2023-11-17 MED ORDER — EVOLOCUMAB 140 MG/ML ~~LOC~~ SOAJ
1.0000 mL | SUBCUTANEOUS | 3 refills | Status: DC
Start: 2023-11-17 — End: 2023-11-18
  Filled 2023-11-17 (×2): qty 6, 84d supply, fill #0

## 2023-11-17 NOTE — Telephone Encounter (Signed)
*  STAT* If patient is at the pharmacy, call can be transferred to refill team.   1. Which medications need to be refilled? (please list name of each medication and dose if known) Evolocumab  140 MG/ML SOAJ   2. Which pharmacy/location (including street and city if local pharmacy) is medication to be sent to? [place] [facility]   3. Do they need a 30 day or 90 day supply? 90

## 2023-11-18 ENCOUNTER — Other Ambulatory Visit (HOSPITAL_COMMUNITY): Payer: Self-pay

## 2023-11-18 MED ORDER — EVOLOCUMAB 140 MG/ML ~~LOC~~ SOAJ
1.0000 mL | SUBCUTANEOUS | 3 refills | Status: AC
  Filled 2023-11-18 – 2024-02-05 (×2): qty 6, 84d supply, fill #0
  Filled 2024-05-10: qty 6, 84d supply, fill #1

## 2023-11-18 NOTE — Addendum Note (Signed)
 Addended by: Sunny English on: 11/18/2023 04:04 PM   Modules accepted: Orders

## 2023-11-23 ENCOUNTER — Other Ambulatory Visit: Payer: Self-pay | Admitting: Internal Medicine

## 2023-11-26 DIAGNOSIS — I10 Essential (primary) hypertension: Secondary | ICD-10-CM | POA: Diagnosis not present

## 2023-11-26 DIAGNOSIS — E119 Type 2 diabetes mellitus without complications: Secondary | ICD-10-CM | POA: Diagnosis not present

## 2023-11-26 DIAGNOSIS — I5032 Chronic diastolic (congestive) heart failure: Secondary | ICD-10-CM | POA: Diagnosis not present

## 2023-11-26 DIAGNOSIS — E1169 Type 2 diabetes mellitus with other specified complication: Secondary | ICD-10-CM | POA: Diagnosis not present

## 2023-11-29 ENCOUNTER — Other Ambulatory Visit: Payer: Self-pay | Admitting: Internal Medicine

## 2023-12-08 ENCOUNTER — Ambulatory Visit (INDEPENDENT_AMBULATORY_CARE_PROVIDER_SITE_OTHER): Admitting: Podiatry

## 2023-12-08 DIAGNOSIS — I5032 Chronic diastolic (congestive) heart failure: Secondary | ICD-10-CM | POA: Diagnosis not present

## 2023-12-08 DIAGNOSIS — M79674 Pain in right toe(s): Secondary | ICD-10-CM

## 2023-12-08 DIAGNOSIS — M79675 Pain in left toe(s): Secondary | ICD-10-CM

## 2023-12-08 DIAGNOSIS — E119 Type 2 diabetes mellitus without complications: Secondary | ICD-10-CM | POA: Diagnosis not present

## 2023-12-08 DIAGNOSIS — B351 Tinea unguium: Secondary | ICD-10-CM | POA: Diagnosis not present

## 2023-12-08 DIAGNOSIS — I10 Essential (primary) hypertension: Secondary | ICD-10-CM | POA: Diagnosis not present

## 2023-12-08 DIAGNOSIS — E7800 Pure hypercholesterolemia, unspecified: Secondary | ICD-10-CM | POA: Diagnosis not present

## 2023-12-08 DIAGNOSIS — E1169 Type 2 diabetes mellitus with other specified complication: Secondary | ICD-10-CM | POA: Diagnosis not present

## 2023-12-08 NOTE — Progress Notes (Signed)
 Subjective:  Patient ID: Whitney Newton, female    DOB: 07-Feb-1941,  MRN: [id number]   Whitney Newton presents to clinic today for:  Chief Complaint  Patient presents with   Nail Problem    Pt is here to have nails trimmed down   Patient notes nails are thick, discolored, elongated and painful in shoegear when trying to ambulate.  She uses a walker to assist with ambulation  PCP is Whitney Cole, MD.  Past Medical History:  Diagnosis Date   Acute diastolic (congestive) heart failure (HCC) 12/25/2015   Back pain    prior back surgery in 2009 - slow to recover   Cancer [facility])    Cerebral infarct (HCC) 05/12/2013   Diabetes (HCC)    Diverticulitis    Gastritis    Hemangioma of liver    managed conservatively; [facility] at [facility]   HLD (hyperlipidemia)    statin intolerant   Hypertension    Normal cardiac stress test 06/10/2009   Obesity    Thyroid  disease    hypothyroidism    Past Surgical History:  Procedure Laterality Date   ABDOMINAL HYSTERECTOMY     APPENDECTOMY     LEFT HEART CATH AND CORONARY ANGIOGRAPHY N/A 02/11/2018   Procedure: LEFT HEART CATH AND CORONARY ANGIOGRAPHY;  Surgeon: Burnard Debby LABOR, MD;  [facility];  Service: Cardiovascular;  Laterality: N/A;   NASAL RECONSTRUCTION      Allergies  Allergen Reactions   Celecoxib Shortness Of Breath   Lipitor [Atorvastatin Calcium] Shortness Of Breath and Other (See Comments)    CHEST PAIN   Zetia [Ezetimibe] Shortness Of Breath and Other (See Comments)    NO SLEEP   Ropinirole Hcl Other (See Comments)    Restless legs syndrome.   Actos [Pioglitazone Hydrochloride] Swelling   Crestor [Rosuvastatin Calcium] Other (See Comments)    LEG PAIN   Demeclocycline     gastritis   Doxycycline Other (See Comments)    gastritis   Duloxetine Nausea Only   Fenofibrate     Unknown reaction   Imdur  [Isosorbide  Nitrate] Diarrhea    Headache, stiff neck   Metformin And Related Swelling    Weight  gain   Metoprolol  Succinate [Metoprolol ] Other (See Comments)    Joint pain   Pravachol [Pravastatin]     Joint pain   Ranolazine Er Other (See Comments)    EDEMA   Rosuvastatin Other (See Comments)    LEG PAIN   Niaspan [Niacin] Rash    Flushing     Review of Systems: Negative except as noted in the HPI.  Objective:  Whitney Newton is a pleasant 83 y.o. female in NAD. AAO x 3.  Vascular Examination: Capillary refill time is 3-5 seconds to toes bilateral. Palpable pedal pulses b/l LE. Digital hair present b/l.  Skin temperature gradient WNL b/l. No varicosities b/l. No cyanosis noted b/l.   Dermatological Examination: Pedal skin with normal turgor, texture and tone b/l. No open wounds. No interdigital macerations b/l. Toenails x10 are 3mm thick, discolored, dystrophic with subungual debris. There is pain with compression of the nail plates.  They are elongated x10     Latest Ref Rng & Units 01/29/2023    9:33 AM  Hemoglobin A1C  Hemoglobin-A1c 4.8 - 5.6 % 6.7    Assessment/Plan: 1. Pain due to onychomycosis of toenails of both feet     The mycotic toenails were sharply debrided x10 with sterile nail  nippers and a power debriding burr to decrease bulk/thickness and length.    Return in about 9 weeks (around 02/09/2024) for Whitney Newton.   Whitney Newton, DPM, FACFAS [facility]     [address] [place], [place] [address]                Office [phone]  Fax [phone]

## 2023-12-19 ENCOUNTER — Other Ambulatory Visit: Payer: Self-pay | Admitting: Internal Medicine

## 2023-12-23 DIAGNOSIS — Z7901 Long term (current) use of anticoagulants: Secondary | ICD-10-CM | POA: Diagnosis not present

## 2023-12-23 DIAGNOSIS — Z8673 Personal history of transient ischemic attack (TIA), and cerebral infarction without residual deficits: Secondary | ICD-10-CM | POA: Diagnosis not present

## 2023-12-23 DIAGNOSIS — M6281 Muscle weakness (generalized): Secondary | ICD-10-CM | POA: Diagnosis not present

## 2023-12-23 DIAGNOSIS — M5414 Radiculopathy, thoracic region: Secondary | ICD-10-CM | POA: Diagnosis not present

## 2023-12-23 DIAGNOSIS — G4730 Sleep apnea, unspecified: Secondary | ICD-10-CM | POA: Diagnosis not present

## 2023-12-23 DIAGNOSIS — Z7982 Long term (current) use of aspirin: Secondary | ICD-10-CM | POA: Diagnosis not present

## 2023-12-23 DIAGNOSIS — I6601 Occlusion and stenosis of right middle cerebral artery: Secondary | ICD-10-CM | POA: Diagnosis not present

## 2023-12-23 DIAGNOSIS — I771 Stricture of artery: Secondary | ICD-10-CM | POA: Diagnosis not present

## 2023-12-23 DIAGNOSIS — R110 Nausea: Secondary | ICD-10-CM | POA: Diagnosis not present

## 2023-12-26 DIAGNOSIS — K869 Disease of pancreas, unspecified: Secondary | ICD-10-CM | POA: Diagnosis not present

## 2023-12-26 DIAGNOSIS — K5900 Constipation, unspecified: Secondary | ICD-10-CM | POA: Diagnosis not present

## 2023-12-26 DIAGNOSIS — R945 Abnormal results of liver function studies: Secondary | ICD-10-CM | POA: Diagnosis not present

## 2023-12-26 DIAGNOSIS — R109 Unspecified abdominal pain: Secondary | ICD-10-CM | POA: Diagnosis not present

## 2023-12-26 DIAGNOSIS — R110 Nausea: Secondary | ICD-10-CM | POA: Diagnosis not present

## 2023-12-26 DIAGNOSIS — D1803 Hemangioma of intra-abdominal structures: Secondary | ICD-10-CM | POA: Diagnosis not present

## 2023-12-26 DIAGNOSIS — R1013 Epigastric pain: Secondary | ICD-10-CM | POA: Diagnosis not present

## 2024-01-04 DIAGNOSIS — I5032 Chronic diastolic (congestive) heart failure: Secondary | ICD-10-CM | POA: Diagnosis not present

## 2024-01-04 DIAGNOSIS — I10 Essential (primary) hypertension: Secondary | ICD-10-CM | POA: Diagnosis not present

## 2024-01-04 DIAGNOSIS — E119 Type 2 diabetes mellitus without complications: Secondary | ICD-10-CM | POA: Diagnosis not present

## 2024-01-04 DIAGNOSIS — E1169 Type 2 diabetes mellitus with other specified complication: Secondary | ICD-10-CM | POA: Diagnosis not present

## 2024-01-06 ENCOUNTER — Other Ambulatory Visit (HOSPITAL_COMMUNITY): Payer: Self-pay | Admitting: Gastroenterology

## 2024-01-06 DIAGNOSIS — R109 Unspecified abdominal pain: Secondary | ICD-10-CM

## 2024-01-06 DIAGNOSIS — D1803 Hemangioma of intra-abdominal structures: Secondary | ICD-10-CM

## 2024-01-06 DIAGNOSIS — R7989 Other specified abnormal findings of blood chemistry: Secondary | ICD-10-CM

## 2024-01-08 DIAGNOSIS — E1169 Type 2 diabetes mellitus with other specified complication: Secondary | ICD-10-CM | POA: Diagnosis not present

## 2024-01-08 DIAGNOSIS — I10 Essential (primary) hypertension: Secondary | ICD-10-CM | POA: Diagnosis not present

## 2024-01-08 DIAGNOSIS — E119 Type 2 diabetes mellitus without complications: Secondary | ICD-10-CM | POA: Diagnosis not present

## 2024-01-08 DIAGNOSIS — E7800 Pure hypercholesterolemia, unspecified: Secondary | ICD-10-CM | POA: Diagnosis not present

## 2024-01-08 DIAGNOSIS — I5032 Chronic diastolic (congestive) heart failure: Secondary | ICD-10-CM | POA: Diagnosis not present

## 2024-01-14 ENCOUNTER — Other Ambulatory Visit (HOSPITAL_COMMUNITY): Payer: Self-pay

## 2024-01-19 ENCOUNTER — Encounter (HOSPITAL_COMMUNITY)
Admission: RE | Admit: 2024-01-19 | Discharge: 2024-01-19 | Disposition: A | Discharge status: Home / Self Care | Source: Ambulatory Visit | Attending: Gastroenterology | Admitting: Gastroenterology

## 2024-01-19 ENCOUNTER — Encounter (HOSPITAL_COMMUNITY): Payer: Self-pay

## 2024-01-19 DIAGNOSIS — R109 Unspecified abdominal pain: Secondary | ICD-10-CM | POA: Diagnosis not present

## 2024-01-19 DIAGNOSIS — D1803 Hemangioma of intra-abdominal structures: Secondary | ICD-10-CM | POA: Diagnosis not present

## 2024-01-19 DIAGNOSIS — R7989 Other specified abnormal findings of blood chemistry: Secondary | ICD-10-CM | POA: Diagnosis not present

## 2024-01-19 DIAGNOSIS — R110 Nausea: Secondary | ICD-10-CM | POA: Diagnosis not present

## 2024-01-19 MED ORDER — TECHNETIUM TC 99M SULFUR COLLOID
2.0000 | Freq: Once | INTRAVENOUS | Status: AC | PRN
  Administered 2024-01-19 (×2): 2 via INTRAVENOUS

## 2024-02-03 DIAGNOSIS — I5032 Chronic diastolic (congestive) heart failure: Secondary | ICD-10-CM | POA: Diagnosis not present

## 2024-02-03 DIAGNOSIS — I10 Essential (primary) hypertension: Secondary | ICD-10-CM | POA: Diagnosis not present

## 2024-02-03 DIAGNOSIS — E119 Type 2 diabetes mellitus without complications: Secondary | ICD-10-CM | POA: Diagnosis not present

## 2024-02-03 DIAGNOSIS — E1169 Type 2 diabetes mellitus with other specified complication: Secondary | ICD-10-CM | POA: Diagnosis not present

## 2024-02-05 ENCOUNTER — Other Ambulatory Visit: Payer: Self-pay

## 2024-02-05 ENCOUNTER — Other Ambulatory Visit (HOSPITAL_COMMUNITY): Payer: Self-pay

## 2024-02-08 DIAGNOSIS — E1169 Type 2 diabetes mellitus with other specified complication: Secondary | ICD-10-CM | POA: Diagnosis not present

## 2024-02-08 DIAGNOSIS — I5032 Chronic diastolic (congestive) heart failure: Secondary | ICD-10-CM | POA: Diagnosis not present

## 2024-02-08 DIAGNOSIS — I10 Essential (primary) hypertension: Secondary | ICD-10-CM | POA: Diagnosis not present

## 2024-02-08 DIAGNOSIS — E119 Type 2 diabetes mellitus without complications: Secondary | ICD-10-CM | POA: Diagnosis not present

## 2024-02-08 DIAGNOSIS — E7800 Pure hypercholesterolemia, unspecified: Secondary | ICD-10-CM | POA: Diagnosis not present

## 2024-02-10 DIAGNOSIS — K3184 Gastroparesis: Secondary | ICD-10-CM | POA: Diagnosis not present

## 2024-02-16 ENCOUNTER — Encounter: Payer: Self-pay | Admitting: Podiatry

## 2024-02-16 ENCOUNTER — Ambulatory Visit (INDEPENDENT_AMBULATORY_CARE_PROVIDER_SITE_OTHER): Admitting: Podiatry

## 2024-02-16 DIAGNOSIS — M79675 Pain in left toe(s): Secondary | ICD-10-CM

## 2024-02-16 DIAGNOSIS — M79674 Pain in right toe(s): Secondary | ICD-10-CM | POA: Diagnosis not present

## 2024-02-16 DIAGNOSIS — B351 Tinea unguium: Secondary | ICD-10-CM | POA: Diagnosis not present

## 2024-02-16 NOTE — Progress Notes (Signed)
 Subjective:  Patient ID: Whitney Newton, female    DOB: Oct 28, 1940,  MRN: [id number]   Whitney Newton presents to clinic today for:  Chief Complaint  Patient presents with   Diabetes    [facility] nail trim. No pain reported.  A1c 6.7  plavix . 81mg  asprin.   Patient notes nails are thick, discolored, elongated and painful in shoegear when trying to ambulate.  She uses a walker to assist with ambulation.  Apologized to the patient for the delay in getting back to the exam room.  PCP is Whitney Cole, MD.  Past Medical History:  Diagnosis Date   Acute diastolic (congestive) heart failure (HCC) 12/25/2015   Back pain    prior back surgery in 2009 - slow to recover   Cancer [facility])    Cerebral infarct (HCC) 05/12/2013   Diabetes (HCC)    Diverticulitis    Gastritis    Hemangioma of liver    managed conservatively; [facility]   HLD (hyperlipidemia)    statin intolerant   Hypertension    Normal cardiac stress test 06/10/2009   Obesity    Thyroid  disease    hypothyroidism    Past Surgical History:  Procedure Laterality Date   ABDOMINAL HYSTERECTOMY     APPENDECTOMY     LEFT HEART CATH AND CORONARY ANGIOGRAPHY N/A 02/11/2018   Procedure: LEFT HEART CATH AND CORONARY ANGIOGRAPHY;  Surgeon: Burnard Debby LABOR, MD;  [facility];  Service: Cardiovascular;  Laterality: N/A;   NASAL RECONSTRUCTION      Allergies  Allergen Reactions   Celecoxib Shortness Of Breath   Lipitor [Atorvastatin Calcium] Shortness Of Breath and Other (See Comments)    CHEST PAIN   Zetia [Ezetimibe] Shortness Of Breath and Other (See Comments)    NO SLEEP   Ropinirole Hcl Other (See Comments)    Restless legs syndrome.   Actos [Pioglitazone Hydrochloride] Swelling   Crestor [Rosuvastatin Calcium] Other (See Comments)    LEG PAIN   Demeclocycline     gastritis   Doxycycline Other (See Comments)    gastritis   Duloxetine Nausea Only   Fenofibrate     Unknown reaction   Imdur   [Isosorbide  Nitrate] Diarrhea    Headache, stiff neck   Metformin And Related Swelling    Weight gain   Metoprolol  Succinate [Metoprolol ] Other (See Comments)    Joint pain   Pravachol [Pravastatin]     Joint pain   Ranolazine Er Other (See Comments)    EDEMA   Rosuvastatin Other (See Comments)    LEG PAIN   Niaspan [Niacin] Rash    Flushing     Review of Systems: Negative except as noted in the HPI.  Objective:  Whitney Newton is a pleasant 83 y.o. female in NAD. AAO x 3.  Vascular Examination: Capillary refill time is 3-5 seconds to toes bilateral. Palpable pedal pulses b/l LE. Digital hair sparse b/l.  Skin temperature gradient WNL b/l.  +1 pitting edema bilateral lower legs and ankles  Dermatological Examination: Pedal skin with decreased turgor, texture and tone b/l. No open wounds. No interdigital macerations b/l. Toenails x10 are 3mm thick, discolored, dystrophic with subungual debris. There is pain with compression of the nail plates.  They are elongated x10  Assessment/Plan: 1. Pain due to onychomycosis of toenails of both feet     The mycotic toenails were sharply debrided x10 with sterile nail nippers and a power debriding burr to  decrease bulk/thickness and length.    Return in about 9 weeks (around 04/19/2024) for [facility].   [name], DPM, FACFAS [facility]     [address] [place], [place] [address]                Office [phone]  Fax [phone]

## 2024-02-19 DIAGNOSIS — D3A8 Other benign neuroendocrine tumors: Secondary | ICD-10-CM | POA: Diagnosis not present

## 2024-03-08 DIAGNOSIS — R899 Unspecified abnormal finding in specimens from other organs, systems and tissues: Secondary | ICD-10-CM | POA: Diagnosis not present

## 2024-03-08 DIAGNOSIS — E1165 Type 2 diabetes mellitus with hyperglycemia: Secondary | ICD-10-CM | POA: Diagnosis not present

## 2024-03-08 DIAGNOSIS — G9520 Unspecified cord compression: Secondary | ICD-10-CM | POA: Diagnosis not present

## 2024-03-08 DIAGNOSIS — G894 Chronic pain syndrome: Secondary | ICD-10-CM | POA: Diagnosis not present

## 2024-03-08 DIAGNOSIS — Z7409 Other reduced mobility: Secondary | ICD-10-CM | POA: Diagnosis not present

## 2024-03-08 DIAGNOSIS — Z79899 Other long term (current) drug therapy: Secondary | ICD-10-CM | POA: Diagnosis not present

## 2024-03-08 DIAGNOSIS — G5713 Meralgia paresthetica, bilateral lower limbs: Secondary | ICD-10-CM | POA: Diagnosis not present

## 2024-03-09 DIAGNOSIS — I10 Essential (primary) hypertension: Secondary | ICD-10-CM | POA: Diagnosis not present

## 2024-03-09 DIAGNOSIS — E7800 Pure hypercholesterolemia, unspecified: Secondary | ICD-10-CM | POA: Diagnosis not present

## 2024-03-09 DIAGNOSIS — E119 Type 2 diabetes mellitus without complications: Secondary | ICD-10-CM | POA: Diagnosis not present

## 2024-03-09 DIAGNOSIS — I5032 Chronic diastolic (congestive) heart failure: Secondary | ICD-10-CM | POA: Diagnosis not present

## 2024-03-09 DIAGNOSIS — E1169 Type 2 diabetes mellitus with other specified complication: Secondary | ICD-10-CM | POA: Diagnosis not present

## 2024-03-11 DIAGNOSIS — R899 Unspecified abnormal finding in specimens from other organs, systems and tissues: Secondary | ICD-10-CM | POA: Diagnosis not present

## 2024-03-20 DIAGNOSIS — Z23 Encounter for immunization: Secondary | ICD-10-CM | POA: Diagnosis not present

## 2024-03-22 DIAGNOSIS — E113392 Type 2 diabetes mellitus with moderate nonproliferative diabetic retinopathy without macular edema, left eye: Secondary | ICD-10-CM | POA: Diagnosis not present

## 2024-03-22 DIAGNOSIS — H3582 Retinal ischemia: Secondary | ICD-10-CM | POA: Diagnosis not present

## 2024-03-22 DIAGNOSIS — E113311 Type 2 diabetes mellitus with moderate nonproliferative diabetic retinopathy with macular edema, right eye: Secondary | ICD-10-CM | POA: Diagnosis not present

## 2024-03-22 DIAGNOSIS — H401122 Primary open-angle glaucoma, left eye, moderate stage: Secondary | ICD-10-CM | POA: Diagnosis not present

## 2024-03-22 DIAGNOSIS — H59812 Chorioretinal scars after surgery for detachment, left eye: Secondary | ICD-10-CM | POA: Diagnosis not present

## 2024-03-23 DIAGNOSIS — E1165 Type 2 diabetes mellitus with hyperglycemia: Secondary | ICD-10-CM | POA: Diagnosis not present

## 2024-03-23 DIAGNOSIS — E113311 Type 2 diabetes mellitus with moderate nonproliferative diabetic retinopathy with macular edema, right eye: Secondary | ICD-10-CM | POA: Diagnosis not present

## 2024-03-23 DIAGNOSIS — Z794 Long term (current) use of insulin: Secondary | ICD-10-CM | POA: Diagnosis not present

## 2024-03-24 ENCOUNTER — Other Ambulatory Visit: Payer: Self-pay | Admitting: Internal Medicine

## 2024-04-02 DIAGNOSIS — E119 Type 2 diabetes mellitus without complications: Secondary | ICD-10-CM | POA: Diagnosis not present

## 2024-04-02 DIAGNOSIS — E1169 Type 2 diabetes mellitus with other specified complication: Secondary | ICD-10-CM | POA: Diagnosis not present

## 2024-04-02 DIAGNOSIS — I10 Essential (primary) hypertension: Secondary | ICD-10-CM | POA: Diagnosis not present

## 2024-04-02 DIAGNOSIS — I5032 Chronic diastolic (congestive) heart failure: Secondary | ICD-10-CM | POA: Diagnosis not present

## 2024-04-05 DIAGNOSIS — D497 Neoplasm of unspecified behavior of endocrine glands and other parts of nervous system: Secondary | ICD-10-CM | POA: Diagnosis not present

## 2024-04-05 DIAGNOSIS — R899 Unspecified abnormal finding in specimens from other organs, systems and tissues: Secondary | ICD-10-CM | POA: Diagnosis not present

## 2024-04-05 DIAGNOSIS — D472 Monoclonal gammopathy: Secondary | ICD-10-CM | POA: Diagnosis not present

## 2024-04-09 DIAGNOSIS — E1169 Type 2 diabetes mellitus with other specified complication: Secondary | ICD-10-CM | POA: Diagnosis not present

## 2024-04-09 DIAGNOSIS — I5032 Chronic diastolic (congestive) heart failure: Secondary | ICD-10-CM | POA: Diagnosis not present

## 2024-04-09 DIAGNOSIS — E119 Type 2 diabetes mellitus without complications: Secondary | ICD-10-CM | POA: Diagnosis not present

## 2024-04-09 DIAGNOSIS — E7800 Pure hypercholesterolemia, unspecified: Secondary | ICD-10-CM | POA: Diagnosis not present

## 2024-04-09 DIAGNOSIS — I10 Essential (primary) hypertension: Secondary | ICD-10-CM | POA: Diagnosis not present

## 2024-04-26 ENCOUNTER — Encounter: Payer: Self-pay | Admitting: Podiatry

## 2024-04-26 ENCOUNTER — Ambulatory Visit (INDEPENDENT_AMBULATORY_CARE_PROVIDER_SITE_OTHER): Admitting: Podiatry

## 2024-04-26 DIAGNOSIS — B351 Tinea unguium: Secondary | ICD-10-CM | POA: Diagnosis not present

## 2024-04-26 DIAGNOSIS — M79674 Pain in right toe(s): Secondary | ICD-10-CM

## 2024-04-26 DIAGNOSIS — M79675 Pain in left toe(s): Secondary | ICD-10-CM | POA: Diagnosis not present

## 2024-04-26 NOTE — Progress Notes (Unsigned)
 Subjective:  Patient ID: Whitney Newton, female    DOB: 11-Sep-1940,  MRN: [id number]   Whitney Newton presents to clinic today for:  Chief Complaint  Patient presents with   Diabetes    [facility] IDDM A1C 6.7. Toenail trim. Left foot purple in color.    Patient notes nails are thick, discolored, elongated and painful in shoegear when trying to ambulate.  She uses a walker to assist with ambulation.    PCP is Leonel Cole, MD.  Past Medical History:  Diagnosis Date   Acute diastolic (congestive) heart failure (HCC) 12/25/2015   Back pain    prior back surgery in 2009 - slow to recover   Cancer [facility])    Cerebral infarct (HCC) 05/12/2013   Diabetes (HCC)    Diverticulitis    Gastritis    Hemangioma of liver    managed conservatively; followed at [facility]   HLD (hyperlipidemia)    statin intolerant   Hypertension    Normal cardiac stress test 06/10/2009   Obesity    Thyroid  disease    hypothyroidism    Past Surgical History:  Procedure Laterality Date   ABDOMINAL HYSTERECTOMY     APPENDECTOMY     LEFT HEART CATH AND CORONARY ANGIOGRAPHY N/A 02/11/2018   Procedure: LEFT HEART CATH AND CORONARY ANGIOGRAPHY;  Surgeon: Burnard Debby LABOR, MD;  [facility];  Service: Cardiovascular;  Laterality: N/A;   NASAL RECONSTRUCTION      Allergies  Allergen Reactions   Celecoxib Shortness Of Breath   Lipitor [Atorvastatin Calcium] Shortness Of Breath and Other (See Comments)    CHEST PAIN   Zetia [Ezetimibe] Shortness Of Breath and Other (See Comments)    NO SLEEP   Ropinirole Hcl Other (See Comments)    Restless legs syndrome.   Actos [Pioglitazone Hydrochloride] Swelling   Crestor [Rosuvastatin Calcium] Other (See Comments)    LEG PAIN   Demeclocycline     gastritis   Doxycycline Other (See Comments)    gastritis   Duloxetine Nausea Only   Fenofibrate     Unknown reaction   Imdur  [Isosorbide  Nitrate] Diarrhea    Headache, stiff neck   Metformin And Related  Swelling    Weight gain   Metoprolol  Succinate [Metoprolol ] Other (See Comments)    Joint pain   Pravachol [Pravastatin]     Joint pain   Ranolazine Er Other (See Comments)    EDEMA   Rosuvastatin Other (See Comments)    LEG PAIN   Niaspan [Niacin] Rash    Flushing     Review of Systems: Negative except as noted in the HPI.  Objective:  SANDRALEE Newton is a pleasant 83 y.o. female in NAD. AAO x 3.  Vascular Examination: Capillary refill time is 3-5 seconds to toes bilateral. Palpable pedal pulses b/l LE. Digital hair sparse b/l.  Skin temperature gradient WNL b/l.  +1 pitting edema bilateral lower legs and ankles  Dermatological Examination: Pedal skin with decreased turgor, texture and tone b/l. No open wounds. No interdigital macerations b/l. Toenails x10 are 3mm thick, discolored, dystrophic with subungual debris. There is pain with compression of the nail plates.  They are elongated x10  Assessment/Plan: 1. Pain due to onychomycosis of toenails of both feet     The mycotic toenails were sharply debrided x10 with sterile nail nippers and a power debriding burr to decrease bulk/thickness and length.    Return for 9-10 weeks for [facility].  Whitney Newton [place], DPM, FACFAS [facility]     [address] [place], [place] [address]                Office [phone]  Fax [phone]

## 2024-05-02 DIAGNOSIS — E1169 Type 2 diabetes mellitus with other specified complication: Secondary | ICD-10-CM | POA: Diagnosis not present

## 2024-05-02 DIAGNOSIS — E119 Type 2 diabetes mellitus without complications: Secondary | ICD-10-CM | POA: Diagnosis not present

## 2024-05-02 DIAGNOSIS — I10 Essential (primary) hypertension: Secondary | ICD-10-CM | POA: Diagnosis not present

## 2024-05-02 DIAGNOSIS — I5032 Chronic diastolic (congestive) heart failure: Secondary | ICD-10-CM | POA: Diagnosis not present

## 2024-05-04 DIAGNOSIS — R110 Nausea: Secondary | ICD-10-CM | POA: Diagnosis not present

## 2024-05-04 DIAGNOSIS — K30 Functional dyspepsia: Secondary | ICD-10-CM | POA: Diagnosis not present

## 2024-05-04 DIAGNOSIS — R778 Other specified abnormalities of plasma proteins: Secondary | ICD-10-CM | POA: Diagnosis not present

## 2024-05-05 ENCOUNTER — Encounter: Payer: Self-pay | Admitting: Internal Medicine

## 2024-05-05 ENCOUNTER — Ambulatory Visit: Attending: Internal Medicine | Admitting: Internal Medicine

## 2024-05-05 VITALS — BP 120/50 | HR 77 | Ht 63.5 in | Wt 187.2 lb

## 2024-05-05 DIAGNOSIS — R000 Tachycardia, unspecified: Secondary | ICD-10-CM | POA: Insufficient documentation

## 2024-05-05 DIAGNOSIS — R55 Syncope and collapse: Secondary | ICD-10-CM | POA: Diagnosis not present

## 2024-05-05 DIAGNOSIS — I2510 Atherosclerotic heart disease of native coronary artery without angina pectoris: Secondary | ICD-10-CM | POA: Insufficient documentation

## 2024-05-05 DIAGNOSIS — I700 Atherosclerosis of aorta: Secondary | ICD-10-CM | POA: Diagnosis not present

## 2024-05-05 DIAGNOSIS — I5032 Chronic diastolic (congestive) heart failure: Secondary | ICD-10-CM | POA: Insufficient documentation

## 2024-05-05 DIAGNOSIS — R002 Palpitations: Secondary | ICD-10-CM | POA: Diagnosis not present

## 2024-05-05 DIAGNOSIS — I471 Supraventricular tachycardia, unspecified: Secondary | ICD-10-CM | POA: Insufficient documentation

## 2024-05-05 NOTE — Patient Instructions (Signed)
 Medication Instructions:  NO CHANGES  *If you need a refill on your cardiac medications before your next appointment, please call your pharmacy*   Follow-Up: At [facility], you and your health needs are our priority.  As part of our continuing mission to provide you with exceptional heart care, our providers are all part of one team.  This team includes your primary Cardiologist (physician) and Advanced Practice Providers or APPs (Physician Assistants and Nurse Practitioners) who all work together to provide you with the care you need, when you need it.  Your next appointment:    12 months with Dr.Hilty  We recommend signing up for the patient portal called MyChart.  Sign up information is provided on this After Visit Summary.  MyChart is used to connect with patients for Virtual Visits (Telemedicine).  Patients are able to view lab/test results, encounter notes, upcoming appointments, etc.  Non-urgent messages can be sent to your provider as well.   To learn more about what you can do with MyChart, go to [email or web address].   Other Instructions

## 2024-05-05 NOTE — Progress Notes (Signed)
 Cardiology Office Note   Date:  05/05/2024   ID:  Whitney Newton, Whitney Newton 01-10-1941, MRN [id number]  PCP:  Leonel Cole, MD   CC: Follow-up  History of Present Illness: Whitney Newton is a 83 y.o. female who presents for  Four-month follow-up visit  This pleasant 83 year old woman is seen for a scheduled followup visit. The patient has a history of having had a stroke in early December 2014. She was at her diabetes clinic at [facility] and they diagnosed her and sent her straight to neurology where she was hospitalized for 3 days. The MRI showed a new stroke on the right side. She had carotid Dopplers which did not show any obstructive lesions but she did have some plaque and they put her back on aspirin  and continued her Plavix . The patient had a echocardiogram which was a bubble study and did not show any evidence of right-to-left shunt.  More recently, she has had further neurology workup at [facility] on 06/20/14.  She had a transcranial Doppler.  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  Previous carotid Dopplers had not shown any significant external cranial disease  She has a history of essential hypertension, diabetes mellitus, and dyslipidemia. She has had atypical chest pain. She is intolerant of statin drugs. We updated her lexiscan  Myoview stress test on 03/17/12 and it was normal showing no evidence of ischemia and her ejection fraction was 78%. The patient has never had a cardiac catheterization. She has continued to have some shortness of breath. She had an echocardiogram in 04/07/12 which showed an ejection fraction of 55-65% with grade 1 diastolic dysfunction. There is trivial aortic insufficiency.   The patient has a history of essential hypertension as well as atypical chest pain. Her diabetes is now being followed at the [facility]  clinic at [facility].  Since we last saw her she had an ophthalmology evaluation on 07/27/14 by Dr. Rosan and no diabetic retinopathy was found.  Her last chest x-ray was on 12/21/12 elsewhere and showed a normal heart size and low lung volumes. She had a recent CT scan of the chest on 03/23/13 at [facility] which showed normal heart size and demonstrated atherosclerosis of the aorta.   The patient has a past history of dermatofibrosarcoma of the spine. She had surgery for this at [facility]. She also has a past history of angiolipoma of the abdominal wall.   Since last visit she has not been experiencing any new cardiac symptoms.  She does have sleep apnea.  She uses a CPAP machine.  She has had some low back pain and pain in her hip radiating to her knees.  She has seen Dr. Anderson. She was admitted to the hospital on 01/18/15 for chest and abdominal pain.  She had a CTA of the chest on 01/18/15 which was negative for pulmonary embolus.  The following day she had a Myoview on 01/19/15 which showed no ischemia and her ejection fraction was 85%.  She had a lot of side effects from the Lexiscan  Myoview.  It was felt in retrospect that her presenting complaints were probably GI in origin rather than cardiac.  She does have a history of GERD and sees Dr. Celestia.  since last visit she has had no new cardiac symptoms.  She has not been having any recent  chest discomfort. Her weight is unchanged and her blood pressures been stable.  11/22/2015  Whitney Newton presents today to establish care with me. She is a previous patient of Dr. Charlena Gallop. She recently was at the beach and developed some worsening shortness of breath and leg swelling. Her nephew was there who came down from [place]  with a head cold and she seems to have symptoms now at this time. She was also seen at an urgent care there and placed on amoxicillin. She's taken that for a few days. She notes significant swelling in her  weight is much higher than it had been in the past. She endorses a high salt intake. Her last echocardiogram was in 2013 which showed an EF of 55-65% with grade 1 diastolic dysfunction. She had a stress test in 2016 for chest pain which showed an EF of 85%.  12/25/2015  I saw Whitney Newton that today in the office. She has been taking the Lasix  40 mg daily which was over a week and this resulted in significant diuresis. Her weight had reduced from 202 down to 189. She then had to stop it for a few days because of some other medical problems are currently weight is up to 194. Her BNP associate with this was very low at 8 and renal function appeared to be stable. She reports a 75% improvement in breathing. Her repeat echo shows that EF remains stable with grade 1 diastolic dysfunction.  03/13/2016  Whitney Newton reports she's had a marked improvement in her swelling and breathing with additional diuretics. Weight has been fairly stable. She reports one episode of chest pain which was responsive to nitroglycerin . She's also had some reflux symptoms and is currently on Zegerid  which appears to be helping her symptoms. Is not clear whether these episodes of chest discomfort are related to reflux or coronary ischemia. She did have a negative Myoview stress test in August 2016.  08/29/2016  Whitney Newton was seen today in the office. Over the past several day she's had worsening shortness of breath and lower extremity swelling. Her weight is now up about 6 pounds from her previous office visit. She said over the auscultation is been taking Lasix  more regularly. She's currently taking 40 mg daily but as previously taken that for 3 days and alternating with 20 mg. She notes some improvement today after a couple days of diuretics.  10/08/2016  Whitney Newton returns today for follow-up. She has diuresed several pounds with increased dose diuretics are per creatinine rose and therefore I decreased her diuretic back to 40  mg daily. She says she feels like she is over dried out on that dose of diuretic. She occasionally gets some cramping. She's also had some labile blood sugars which she attributes to the diuretics. In addition she had a recent bowel obstruction which may been related to diuresis.  03/06/2017  Whitney Newton was seen today in follow-up. She recently got back from a trip to assess catch one with her husband. She said they were traveling for about 16 days and she did not take her Lasix  during that period of time. Her weight is up about 6 pounds that she reports lower extremity swelling. She has a sliding scale Lasix  to take but has not been compliant with that.  08/20/2017  Whitney Newton returns today for follow-up.  She has been having a lot of symptoms recently.  She is complaining of pain across her abdomen.  This is been further assessed and there  is a suggestion it could be related to back surgery.  She was also found to have mass on the liver and pancreas, thought to be hemangioma.  She is scheduled to have a CT scan of her abdomen next week.  She continues to gain weight.  Her weight is up 6 pounds since I saw her in September.  Which was 203 at the time.  Her weight had been as low as 195 last year.  She does report lower extremity edema and some worsening shortness of breath however she relates the shortness of breath more to her pain.  She had been on higher dose Lasix  in the past but was noted to have some elevation in creatinine.  We did reduce the dose and she has been compliant taking 20 mg daily.  02/05/2018  Whitney Newton returns for follow-up today.  Again she has numerous complaints however she is recently been having worsening chest discomfort.  She describes it as a squeezing or pressure in the center of her chest sometimes radiates up her neck.  May be associated with worsening shortness of breath.  Despite this she is taken extra Lasix  and her weight is come down to her baseline of 203.  She has not had symptomatic  improvement.  She was scheduled to see me in a few weeks but got an earlier appointment because of her chest discomfort.  Sometimes her symptoms are associated with exertion and relieved by rest at other times can be present at rest or when awakening.  02/23/2018  Whitney Newton was seen today in follow-up of heart cath.  She underwent left heart catheterization on 02/11/2018.  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  LVEF was 65% with normal LVEDP 18 mmHg.  Post cath she still reports shortness of breath with certain activities.  She did have recent lipid testing which was as follows: LDL 148, Total chol 225, HDL 54, Non-HDL 171, TG 178.  Her goal LDL is less than 70 and she has not yet reach that target.  Unfortunately she has been statin intolerant and is a good candidate for PCSK9 inhibitor.  05/22/2018  Whitney Newton is seen today in follow-up.  She was started on Repatha  which she seems to be tolerating well.  She has been on the medicine now for about 3 months and is getting it from free with support from [employer].  Recently she had transcranial Dopplers at [facility] which actually shows some mild improvement in her intracranial atherosclerosis.  This could be related to her aggressive lipid therapy.  She was supposed to have repeat labs prior to this visit however it was not performed.  We will need to order a repeat lipid profile in order to reassess her therapy so far.  08/10/2018  Whitney Newton returns today for follow-up.  She has been doing well with Repatha  although has had several injections which have possibly failed.  She seems to be injecting on the anterior thigh and has had some bleeding and bruising.  She also has trouble depressing the injector and noted some leakage of the medication.  I did perform some  extra training with her today and helped describe to her a better way to hold the pen as well as a better injection site which I think should improve her drug delivery.  That being said  in December, her total cholesterol come down significantly to 104, with HDL 62, LDL 26 and triglycerides of 81.  Hemoglobin A1c remains persistently elevated at 8.2.  04/16/2019  Whitney Newton is seen today in follow-up.  She thinks she might of had another stroke in May.  She had some word finding difficulties.  She is being followed by neurology at [facility] and had a recent transcranial Doppler and may have an MRI because she is been having some numbness and tingling in her arms.  She seems to be tolerating Repatha .  Her labs are markedly improved.  Total cholesterol now 104, triglycerides 81, HDL 62 and LDL 26.  No significant side effects with it.  10/22/2019  Whitney Newton returns today for follow-up with her husband.  She denies any new stroke symptoms.  She is responded well to Repatha  and although her LDL was down as low as 14 it is now come up to 49 about 6 months ago.  Again this is well controlled however the increase is due to dietary changes.  She says she is eating more pizza, bacon and eggs and other saturated fats.  In addition she has gained weight.  There is also lower extremity edema.  She reports lack of compliance with the Lasix .  She says she may go several days without taking any and then has to take several days worth.  She has had issues with incontinence and a spastic bladder.  04/19/2020  Whitney Newton returns today with her husband.  Overall she reports some worsening shortness of breath.  She has had lower extremity edema and weight gain despite taking her Lasix  more regularly.  She takes 40 mg daily.  Weight is up again another few pounds.  She is not had a repeat echo since 2017 which showed normal LVEF at the time but diastolic dysfunction.  01/24/2021  Whitney Newton returns today in follow-up.  She has a number of different  complaints today.  She is really struggling with fatigue and some daytime somnolence.  She says she has trouble sleeping at night.  I think she is struggling with neuropathy.  She has lower extremity edema but recently we had increased her diuretics.  Her BNP was low and I recommended instead of 40 twice daily to go to 60 mg Lasix  daily.  Potassium and electrolytes are normal however she has had leg cramps and restlessness mostly at night.  06/08/2021  Whitney Newton is seen today in follow-up.  She was just hospitalized at [facility] for syncopal episode.  This occurred in her home.  She apparently became somewhat unresponsive and her husband called 911.  She then syncopized and was awakened by EMS.  She was taken to [facility].  Initial work-up was unremarkable.  She apparently had an elevated D-dimer but testing was negative for PE.  Repeat echo showed normal LVEF 60 to 65%.  She was noted to be dehydrated and taken off of her Ziac .  She had previously discontinued Lasix  which I placed her on before.  She says that her swelling is actually been a little better and she is urinating less.  They increased her losartan  from 25 to 50 mg daily and noted she was B12 deficient and had given her some B12 injections.  She says her energy level has improved somewhat.  EKG here shows normal sinus rhythm at 94.  She was placed on a monitor which she recently returned but those results were not available and would be interpreted by the staff at [facility].  08/10/2021  Whitney Newton returns today for follow-up of her monitor and worsening edema.  Blood pressure is also been poorly controlled recently.  She is been taken off of Ziac .  She was on metoprolol  which I started but she said she could not tolerate it due to joint pains.  She also has had worsening lower extremity edema and weight gain up another 5 pounds since I saw her in December.  Overall she does not feel well.  Unfortunately she has numerous medication intolerances and it has been  difficult to find a regimen that she could tolerate.  02/14/2022  Whitney Newton is seen today in follow-up.  She was recently seen by Delon Holts, PA-C and noted to be volume overloaded.  She was not tolerant of furosemide  however she was trialed on torsemide .  This seemed to work a little better for her and she had actually lost quite a bit of weight down to about 203 pounds however her weight now is back up to 212 pounds.  She also has regained a lot of fluid.  It is likely because she has not been taking the torsemide  which she thought she may only have to take as needed.  For some reason her prescription torsemide  was only written for 1 dose.  She did have repeat labs which showed essentially no change in renal function.  08/26/2022  Whitney Newton returns today for follow-up.  Unfortunately her husband passed away at the end of last year.  Her family does seem to be quite supportive of her and she says she is starting to get more involved with church.  She did lose a significant amount of weight, about 20 pounds since September.  Part of that I think was fluid as she has been on torsemide  but she says at times now she is not able to take it on a daily basis because she gets significant cramping.  She is still struggling from some swelling in her legs.  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 She has tried to wear stockings but has been unable to get them on or tolerate them. Patient has attempted exercise, elevation, and compression for four weeks without success. Her BNP recently was negative.  She does have difficulty walking and is requesting a handicap parking sticker.   10/29/2022  Whitney Newton is seen today for an acute visit.  I just saw her back in March.  We have been working to try to get her lymphedema pumps.  Apparently she was measured and fitted for those but they have not yet arrived.  She reports that she called in to be seen today because she has been having some palpitations and tachycardia.  Her Apple Watch  has been noting that she has been having fast heart rates.  There is concern for possible atrial fibrillation however her watch does not give that information.  EKG today shows normal sinus rhythm at 83.  04/18/2023  Whitney Newton returns today for follow-up.  She underwent lower extremity venous Dopplers which were to assess for reflux.  There was no significant treatable reflux found.  There is concern for possible lymphedema.  She was placed with lymphedema pumps which she is using.  She started using an over-the-counter supplement which she says has helped with her edema and stopped her torsemide .  Other than that she feels like she is doing fairly well.  She did have repeat lipids in August on Repatha  with total cholesterol 102, HDL 52, triglycerides 100 and LDL 30.  05/04/2024  Whitney Newton is seen today  in follow-up.  Overall she is doing well although understandably is a little upset at the death of her husband which is about 2 years ago at Thanksgiving.  She reports good control of her swelling typically in the morning however her swelling does worsen throughout the day.  She has lymphedema and neuropathy.  She is on a diuretic for this.  Her lipids have been well-controlled on Repatha .  She has been struggling with nausea.  She has been needing to take Phenergan  and an antidiarrheal medication sometimes twice a day.  She is followed closely at [facility] for this and it is her main concern at the time being.  Blood pressure was low normal today 120/50.  Past Medical History:  Diagnosis Date   Acute diastolic (congestive) heart failure (HCC) 12/25/2015   Back pain    prior back surgery in 2009 - slow to recover   Cancer [facility])    Cerebral infarct (HCC) 05/12/2013   Diabetes (HCC)    Diverticulitis    Gastritis    Hemangioma of liver    managed conservatively; followed at [facility]   HLD (hyperlipidemia)    statin intolerant   Hypertension    Normal cardiac stress test 06/10/2009   Obesity    Thyroid   disease    hypothyroidism    Past Surgical History:  Procedure Laterality Date   ABDOMINAL HYSTERECTOMY     APPENDECTOMY     LEFT HEART CATH AND CORONARY ANGIOGRAPHY N/A 02/11/2018   Procedure: LEFT HEART CATH AND CORONARY ANGIOGRAPHY;  Surgeon: Burnard Debby LABOR, MD;  Location: [facility] INVASIVE CV LAB;  Service: Cardiovascular;  Laterality: N/A;   NASAL RECONSTRUCTION       Current Outpatient Medications  Medication Sig Dispense Refill   aspirin  81 MG tablet Take 81 mg by mouth daily.     clopidogrel  (PLAVIX ) 75 MG tablet TAKE ONE TABLET BY MOUTH DAILY 90 tablet 3   colesevelam  (WELCHOL ) 625 MG tablet Take 3 tablets (1,875 mg total) by mouth 2 (two) times daily as needed (cholesterol). 180 tablet 6   Cyanocobalamin  (VITAMIN B-12) 5000 MCG TBDP Take 5,000 mcg by mouth daily.     diltiazem  (CARDIZEM  CD) 120 MG 24 hr capsule Take 1 capsule (120 mg total) by mouth daily. 30 capsule 4   Evolocumab  140 MG/ML SOAJ Inject 140 mg into the skin every 14 (fourteen) days. 6 mL 3   famotidine (PEPCID) 20 MG tablet Take 20 mg by mouth daily.     gabapentin  (NEURONTIN ) 100 MG capsule Take 3 capsules (300mg ) by mouth at night and take additional 1-3 capsules daily as needed for breakthrough. 180 capsule 5   Glucagon 0.5 MG/0.1ML SOAJ      Homeopathic Products (LYMPHOMYOSOT LYMPH SUPPORT PO) Take 2 capsules by mouth daily.     HUMALOG KWIKPEN 100 UNIT/ML KiwkPen Inject 14 Units into the skin See admin instructions. Inject 11 units SQ at breakfast, inject 14 units SQ at lunch and inject 16 units SQ at supper. Plus sliding scale 1 unit for every 25 BS is > 120     Hyoscyamine  Sulfate 0.375 MG TBCR Take 1 tablet every 12 hours by oral route.     levothyroxine  (SYNTHROID , LEVOTHROID) 88 MCG tablet Take 88 mcg by mouth daily.      Liniments (SALONPAS EX) Apply 1 patch topically daily as needed (for pain).     losartan  (COZAAR ) 25 MG tablet Take 1 tablet (25 mg total) by mouth daily. 90 tablet 0   nitroGLYCERIN   (  NITROSTAT ) 0.4 MG SL tablet Place 1 tablet (0.4 mg total) under the tongue every 5 (five) minutes as needed for chest pain. 25 tablet 2   potassium chloride  SA (KLOR-CON  M) 20 MEQ tablet Take 1 tablet (20 mEq total) by mouth daily in the afternoon. 90 tablet 3   promethazine  (PHENERGAN ) 6.25 MG/5ML solution Take by mouth every 6 (six) hours as needed for nausea or vomiting.     torsemide  (DEMADEX ) 20 MG tablet Take 10 mg by mouth daily as needed.     TOUJEO  SOLOSTAR 300 UNIT/ML SOPN Inject 16 Units into the skin daily.     No current facility-administered medications for this visit.    Allergies:   Celecoxib, Lipitor [atorvastatin calcium], Zetia [ezetimibe], Ropinirole hcl, Actos [pioglitazone hydrochloride], Crestor [rosuvastatin calcium], Demeclocycline, Doxycycline, Duloxetine, Fenofibrate, Imdur  [isosorbide  nitrate], Metformin and related, Metoprolol  succinate [metoprolol ], Pravachol [pravastatin], Ranolazine er, Rosuvastatin, and Niaspan [niacin]    Social History:  The patient  reports that she has never smoked. She has never used smokeless tobacco. She reports that she does not drink alcohol and does not use drugs.   Family History:  The patient's family history includes Dementia in her father; Heart attack in her brother and father; Heart disease in her brother and father; Hypertension in her mother; Stroke in her mother.    ROS:   Pertinent items noted in HPI and remainder of comprehensive ROS otherwise negative.  PHYSICAL EXAM: VS:  BP (!) 120/50   Pulse 77   Ht 5' 3.5 (1.613 m)   Wt 187 lb 3.2 oz (84.9 kg)   SpO2 98%   BMI 32.64 kg/m  , BMI Body mass index is 32.64 kg/m. General appearance: alert, no distress and moderately obese Neck: no carotid bruit and no JVD Lungs: diminished breath sounds bilaterally Heart: regular rate and rhythm Abdomen: soft, non-tender; bowel sounds normal; no masses,  no organomegaly Extremities: edema 2+ pedal edema, notable bilateral LE  hyperpigmentation, small varicosities and spider veins are noted, the edema is mildly pitting Pulses: 2+ and symmetric Skin: There is lower extremity hyperpigmentation/hemosiderin deposition Neurologic: Grossly normal Psych: Mildly anxious  EKG:   EKG Interpretation Date/Time:  Wednesday May 05 2024 16:12:57 EST Ventricular Rate:  77 PR Interval:  168 QRS Duration:  108 QT Interval:  408 QTC Calculation: 461 R Axis:   -18  Text Interpretation: Normal sinus rhythm Low voltage QRS Incomplete right bundle branch block Inferior infarct (cited on or before 18-Apr-2023) Possible Anterolateral infarct , age undetermined When compared with ECG of 18-Apr-2023 16:20, Borderline criteria for Anterior infarct are now Present Borderline criteria for Anterolateral infarct are now Present QT has lengthened Confirmed by Mona Kent [phone]) on 05/05/2024 4:23:52 PM    Recent Labs: No results found for requested labs within last 365 days.    Lipid Panel    Component Value Date/Time   CHOL 102 01/29/2023 0934   CHOL 129 01/10/2022 0835   TRIG 100 01/29/2023 0934   HDL 52 01/29/2023 0934   HDL 72 01/10/2022 0835   CHOLHDL 2.0 01/29/2023 0934   VLDL 20 01/29/2023 0934   LDLCALC 30 01/29/2023 0934   LDLCALC 39 01/10/2022 0835   LDLDIRECT 148.7 09/14/2012 0857      Wt Readings from Last 3 Encounters:  05/05/24 187 lb 3.2 oz (84.9 kg)  04/18/23 191 lb (86.6 kg)  01/28/23 201 lb 8 oz (91.4 kg)     ASSESSMENT AND PLAN:  Syncope, possibly vasovagal Recent tachycardia on her Apple  Watch Chronic diastolic congestive heart failure Hypertensive heart disease without heart failure Diabetes mellitus - on insulin  Atypical chest pain with normal Myoview stress test in August 2016.  No ischemia.  Ejection fraction 85%. old CVA followed at [facility] History of dermatofibro- sarcoma of the spine. Dyslipidemia Osteoarthritis OSA on CPAP [facility]) Hypothyroidism followed at  [facility]. GERD, followed by Dr. Celestia  RBBB Peripheral neuropathy Statin intolerance - on Repatha  Lymphedema   PLAN:  Mrs. Spittler seems to be doing reasonably well from a cardiac standpoint.  She denies any chest pain or shortness of breath.  No evidence of any worsening heart failure although she does have chronic lower extremity swelling and peripheral neuropathy.  There is likely a lymphedema component.  She is on diuretics for this.  She has had been having issues with GI nausea and other things she was referred to a specialist.  Blood pressure is well-controlled.  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  Will continue current medications.  Plan follow-up annually or sooner as necessary.  Vinie KYM Maxcy, MD, [facility], FNLA, FACP  [place]  [facility]  Medical Director of the [facility]  [facility] Diplomate of the American Board of Clinical Lipidology Attending Cardiologist  Direct Dial: [phone]  Fax: [phone]  Website:  [email or web address]    05/05/2024 4:45 PM

## 2024-05-09 DIAGNOSIS — E119 Type 2 diabetes mellitus without complications: Secondary | ICD-10-CM | POA: Diagnosis not present

## 2024-05-09 DIAGNOSIS — I5032 Chronic diastolic (congestive) heart failure: Secondary | ICD-10-CM | POA: Diagnosis not present

## 2024-05-09 DIAGNOSIS — I10 Essential (primary) hypertension: Secondary | ICD-10-CM | POA: Diagnosis not present

## 2024-05-09 DIAGNOSIS — E7800 Pure hypercholesterolemia, unspecified: Secondary | ICD-10-CM | POA: Diagnosis not present

## 2024-05-09 DIAGNOSIS — E1169 Type 2 diabetes mellitus with other specified complication: Secondary | ICD-10-CM | POA: Diagnosis not present

## 2024-05-10 ENCOUNTER — Other Ambulatory Visit (HOSPITAL_COMMUNITY): Payer: Self-pay

## 2024-06-04 ENCOUNTER — Other Ambulatory Visit: Payer: Self-pay | Admitting: Internal Medicine

## 2024-06-08 ENCOUNTER — Other Ambulatory Visit: Payer: Self-pay | Admitting: Internal Medicine

## 2024-06-09 ENCOUNTER — Telehealth: Payer: Self-pay | Admitting: Internal Medicine

## 2024-06-09 ENCOUNTER — Other Ambulatory Visit: Payer: Self-pay

## 2024-06-09 MED ORDER — LOSARTAN POTASSIUM 25 MG PO TABS: 25.0000 mg | ORAL_TABLET | Freq: Every day | ORAL | 3 refills | Status: AC

## 2024-06-09 NOTE — Telephone Encounter (Signed)
" °*  STAT* If patient is at the pharmacy, call can be transferred to refill team.   1. Which medications need to be refilled? (please list name of each medication and dose if known) losartan  (COZAAR ) 25 MG tablet    2. Would you like to learn more about the convenience, safety, & potential cost savings by using the [facility]? No    3. Are you open to using the [facility] (Type [facility]. No    4. Which pharmacy/location (including street and city if local pharmacy) is medication to be sent to?[facility] [place], [place] - [address]    5. Do they need a 30 day or 90 day supply? 90 day   "

## 2024-06-24 ENCOUNTER — Other Ambulatory Visit: Payer: Self-pay

## 2024-07-05 ENCOUNTER — Ambulatory Visit: Admitting: Podiatry

## 2024-07-12 ENCOUNTER — Ambulatory Visit: Admitting: Podiatry

## 2024-07-13 ENCOUNTER — Other Ambulatory Visit: Payer: Self-pay | Admitting: Internal Medicine

## 2024-07-14 ENCOUNTER — Telehealth: Payer: Self-pay | Admitting: Internal Medicine

## 2024-07-14 NOTE — Telephone Encounter (Signed)
 Pt calling to make provider aware that 3 weeks ago she had mild chest pains and had to take 1 Nitroglycerin  that did help. Pt states that she did speak with her PCP and made then aware as well. Please advise

## 2024-07-14 NOTE — Telephone Encounter (Signed)
 Called and spoke to pt. She states that ever since she saw Dr. Mona in Nov 2025 she began to experience Chest pains. Associated symptoms : at times short of breath and also feeling very tired. The worst episodes occurred a few days after that office visit. She has taken SL NTG two times (on separate occasions) and both times the Chest pain was relieved. The last time she experienced these chest pains was over the weekend during the snow storm.   She is requesting an appointment and would only like to see Dr. Mona if at all possible. Her daughter is her transportation and many days/times she is not available. For now, scheduled her to see Dr. Mona on 08/06/24.   ER Precautions reviewed with pt and daughter who verbalized understanding.

## 2024-07-15 ENCOUNTER — Ambulatory Visit: Admitting: Podiatry

## 2024-07-15 DIAGNOSIS — B351 Tinea unguium: Secondary | ICD-10-CM

## 2024-07-15 DIAGNOSIS — E1142 Type 2 diabetes mellitus with diabetic polyneuropathy: Secondary | ICD-10-CM

## 2024-07-15 NOTE — Progress Notes (Unsigned)
 Nails x 10.  Patient's blood sugar dropped to 60 while she was in the office.  She was given apple juice and it went up to 70 and she felt much better before she left

## 2024-07-26 ENCOUNTER — Institutional Professional Consult (permissible substitution) (INDEPENDENT_AMBULATORY_CARE_PROVIDER_SITE_OTHER)

## 2024-08-06 ENCOUNTER — Ambulatory Visit: Admitting: Internal Medicine

## 2024-09-29 ENCOUNTER — Ambulatory Visit: Admitting: Podiatry
